# Patient Record
Sex: Male | Born: 1935
Health system: Southern US, Community
[De-identification: ages and names within clinical notes are randomized; demographics above are authoritative.]

## PROBLEM LIST (undated history)

## (undated) DIAGNOSIS — N400 Enlarged prostate without lower urinary tract symptoms: Secondary | ICD-10-CM

## (undated) DIAGNOSIS — L309 Dermatitis, unspecified: Secondary | ICD-10-CM

## (undated) DIAGNOSIS — E039 Hypothyroidism, unspecified: Secondary | ICD-10-CM

## (undated) DIAGNOSIS — I10 Essential (primary) hypertension: Secondary | ICD-10-CM

## (undated) DIAGNOSIS — M549 Dorsalgia, unspecified: Secondary | ICD-10-CM

## (undated) HISTORY — DX: Hypothyroidism, unspecified: E03.9

## (undated) HISTORY — DX: Essential (primary) hypertension: I10

## (undated) HISTORY — DX: Dorsalgia, unspecified: M54.9

## (undated) HISTORY — DX: Benign prostatic hyperplasia without lower urinary tract symptoms: N40.0

---

## 2008-08-30 ENCOUNTER — Inpatient Hospital Stay (HOSPITAL_COMMUNITY): Admission: EM | Admit: 2008-08-30 | Discharge: 2008-09-03 | Payer: Self-pay | Admitting: Emergency Medicine

## 2008-08-30 ENCOUNTER — Ambulatory Visit: Payer: Self-pay | Admitting: Internal Medicine

## 2008-08-31 ENCOUNTER — Encounter: Payer: Self-pay | Admitting: *Deleted

## 2008-08-31 LAB — CONVERTED CEMR LAB
Cholesterol: 199 mg/dL
Triglycerides: 88 mg/dL

## 2008-09-01 ENCOUNTER — Encounter: Payer: Self-pay | Admitting: Internal Medicine

## 2008-09-05 ENCOUNTER — Telehealth: Payer: Self-pay | Admitting: Internal Medicine

## 2008-09-13 ENCOUNTER — Encounter: Payer: Self-pay | Admitting: Internal Medicine

## 2008-09-18 ENCOUNTER — Ambulatory Visit: Payer: Self-pay | Admitting: Internal Medicine

## 2008-09-18 ENCOUNTER — Encounter: Payer: Self-pay | Admitting: Internal Medicine

## 2008-09-18 DIAGNOSIS — R55 Syncope and collapse: Secondary | ICD-10-CM | POA: Insufficient documentation

## 2008-09-18 DIAGNOSIS — I10 Essential (primary) hypertension: Secondary | ICD-10-CM | POA: Insufficient documentation

## 2008-09-18 DIAGNOSIS — N401 Enlarged prostate with lower urinary tract symptoms: Secondary | ICD-10-CM

## 2008-09-18 DIAGNOSIS — M549 Dorsalgia, unspecified: Secondary | ICD-10-CM | POA: Insufficient documentation

## 2008-09-18 DIAGNOSIS — D638 Anemia in other chronic diseases classified elsewhere: Secondary | ICD-10-CM | POA: Insufficient documentation

## 2008-09-18 DIAGNOSIS — D649 Anemia, unspecified: Secondary | ICD-10-CM

## 2008-09-18 LAB — CONVERTED CEMR LAB
Calcium: 9.3 mg/dL (ref 8.4–10.5)
Creatinine, Ser: 1.1 mg/dL (ref 0.40–1.50)
GFR calc Af Amer: >60 mL/min (ref >60)
Glucose, Bld: 87 mg/dL (ref 70–99)
MCHC: 32.1 g/dL (ref 30.0–36.0)
MCV: 84.1 fL (ref 78.0–100.0)
Platelets: 315 10*3/uL (ref 150–400)
RBC: 3.59 M/uL — ABNORMAL LOW (ref 4.22–5.81)
Sodium: 138 meq/L (ref 135–145)

## 2008-09-18 LAB — BASIC METABOLIC PANEL
Creatinine: 1.1 mg/dL (ref 0.6–1.3)
Sodium: 138 mmol/L (ref 137–147)

## 2008-09-18 LAB — CBC AND DIFFERENTIAL: WBC: 3.9 10^3/mL

## 2008-09-23 ENCOUNTER — Telehealth: Payer: Self-pay | Admitting: Internal Medicine

## 2008-09-24 ENCOUNTER — Encounter: Payer: Self-pay | Admitting: *Deleted

## 2008-09-28 ENCOUNTER — Encounter: Payer: Self-pay | Admitting: Internal Medicine

## 2008-10-01 ENCOUNTER — Encounter: Payer: Self-pay | Admitting: Internal Medicine

## 2008-10-01 ENCOUNTER — Encounter: Admission: RE | Admit: 2008-10-01 | Discharge: 2008-11-25 | Payer: Self-pay | Admitting: Internal Medicine

## 2008-10-07 ENCOUNTER — Encounter: Payer: Self-pay | Admitting: Internal Medicine

## 2008-10-07 ENCOUNTER — Telehealth: Payer: Self-pay | Admitting: Internal Medicine

## 2008-10-17 ENCOUNTER — Ambulatory Visit: Payer: Self-pay | Admitting: Internal Medicine

## 2008-10-18 ENCOUNTER — Emergency Department (HOSPITAL_COMMUNITY): Admission: EM | Admit: 2008-10-18 | Discharge: 2008-10-18 | Payer: Self-pay | Admitting: Emergency Medicine

## 2008-10-25 ENCOUNTER — Encounter: Payer: Self-pay | Admitting: Internal Medicine

## 2008-10-25 ENCOUNTER — Inpatient Hospital Stay (HOSPITAL_COMMUNITY): Admission: EM | Admit: 2008-10-25 | Discharge: 2008-10-27 | Payer: Self-pay | Admitting: Emergency Medicine

## 2008-10-25 ENCOUNTER — Ambulatory Visit: Payer: Self-pay | Admitting: Infectious Diseases

## 2008-10-25 DIAGNOSIS — R0602 Shortness of breath: Secondary | ICD-10-CM

## 2008-10-25 DIAGNOSIS — M549 Dorsalgia, unspecified: Secondary | ICD-10-CM

## 2008-10-25 HISTORY — DX: Dorsalgia, unspecified: M54.9

## 2008-10-27 ENCOUNTER — Encounter: Payer: Self-pay | Admitting: Internal Medicine

## 2008-10-28 ENCOUNTER — Ambulatory Visit: Payer: Self-pay | Admitting: Internal Medicine

## 2008-10-28 ENCOUNTER — Encounter: Payer: Self-pay | Admitting: Internal Medicine

## 2008-10-28 ENCOUNTER — Telehealth: Payer: Self-pay | Admitting: Internal Medicine

## 2008-10-28 LAB — TSH: TSH: 7.55 u[IU]/mL — AB (ref <=5.90)

## 2008-10-29 ENCOUNTER — Encounter: Payer: Self-pay | Admitting: Internal Medicine

## 2008-10-29 LAB — CONVERTED CEMR LAB: TSH: 7.554 microintl units/mL — ABNORMAL HIGH (ref 0.350–4.500)

## 2008-10-30 ENCOUNTER — Ambulatory Visit: Payer: Self-pay | Admitting: Internal Medicine

## 2008-10-30 DIAGNOSIS — E039 Hypothyroidism, unspecified: Secondary | ICD-10-CM | POA: Insufficient documentation

## 2008-10-31 ENCOUNTER — Encounter: Payer: Self-pay | Admitting: Infectious Disease

## 2008-11-03 ENCOUNTER — Ambulatory Visit: Payer: Self-pay | Admitting: *Deleted

## 2008-11-03 ENCOUNTER — Encounter: Payer: Self-pay | Admitting: Internal Medicine

## 2008-11-03 LAB — CONVERTED CEMR LAB
Basophils Absolute: 0 10*3/uL (ref 0.0–0.1)
Ferritin: 43 ng/mL (ref 22–322)
HCT: 33.1 % — ABNORMAL LOW (ref 39.0–52.0)
Lymphocytes Relative: 37 % (ref 12–46)
Lymphs Abs: 1.4 10*3/uL (ref 0.7–4.0)
Neutro Abs: 2 10*3/uL (ref 1.7–7.7)
Neutrophils Relative %: 51 % (ref 43–77)
Platelets: 312 10*3/uL (ref 150–400)
RDW: 13.8 % (ref 11.5–15.5)
Saturation Ratios: 6 % — ABNORMAL LOW (ref 20–55)
TIBC: 291 ug/dL (ref 215–435)
UIBC: 274 ug/dL
Vitamin B-12: 321 pg/mL (ref 211–911)
WBC: 3.9 10*3/uL — ABNORMAL LOW (ref 4.0–10.5)

## 2008-11-05 ENCOUNTER — Telehealth: Payer: Self-pay | Admitting: Internal Medicine

## 2008-11-06 ENCOUNTER — Ambulatory Visit (HOSPITAL_COMMUNITY): Admission: RE | Admit: 2008-11-06 | Discharge: 2008-11-06 | Payer: Self-pay | Admitting: Internal Medicine

## 2008-11-10 ENCOUNTER — Telehealth (INDEPENDENT_AMBULATORY_CARE_PROVIDER_SITE_OTHER): Payer: Self-pay | Admitting: *Deleted

## 2008-11-11 ENCOUNTER — Encounter: Payer: Self-pay | Admitting: Internal Medicine

## 2008-11-11 ENCOUNTER — Ambulatory Visit: Payer: Self-pay | Admitting: Infectious Disease

## 2008-11-11 LAB — CONVERTED CEMR LAB
INR: 1.1 (ref 0.0–1.5)
Prothrombin Time: 14.2 s (ref 11.6–15.2)

## 2008-11-13 ENCOUNTER — Telehealth: Payer: Self-pay | Admitting: *Deleted

## 2008-11-25 ENCOUNTER — Telehealth: Payer: Self-pay | Admitting: Internal Medicine

## 2008-11-25 ENCOUNTER — Encounter: Payer: Self-pay | Admitting: Internal Medicine

## 2008-11-27 ENCOUNTER — Ambulatory Visit: Payer: Self-pay | Admitting: Internal Medicine

## 2008-11-27 DIAGNOSIS — K5909 Other constipation: Secondary | ICD-10-CM

## 2008-11-27 DIAGNOSIS — F4322 Adjustment disorder with anxiety: Secondary | ICD-10-CM

## 2008-12-11 ENCOUNTER — Inpatient Hospital Stay (HOSPITAL_COMMUNITY): Admission: RE | Admit: 2008-12-11 | Discharge: 2008-12-15 | Payer: Self-pay | Admitting: Neurosurgery

## 2008-12-11 HISTORY — PX: LAMINECTOMY: SHX219

## 2008-12-23 ENCOUNTER — Encounter (INDEPENDENT_AMBULATORY_CARE_PROVIDER_SITE_OTHER): Payer: Self-pay | Admitting: Internal Medicine

## 2009-01-15 ENCOUNTER — Ambulatory Visit: Payer: Self-pay | Admitting: Infectious Diseases

## 2009-01-23 ENCOUNTER — Encounter: Payer: Self-pay | Admitting: Internal Medicine

## 2009-01-23 ENCOUNTER — Telehealth: Payer: Self-pay | Admitting: Internal Medicine

## 2010-07-14 IMAGING — CR DG ABDOMEN 2V
2 series · 2 of 2 positions shown · non-contrast
Comparison: Lumbar spine radiographs 08/30/2008 and chest
radiograph 10/18/2008

CLINICAL DATA: Shortness of breath.  Abdominal pain and
constipation

ABDOMEN - 2 VIEW

[w abdomen upright]
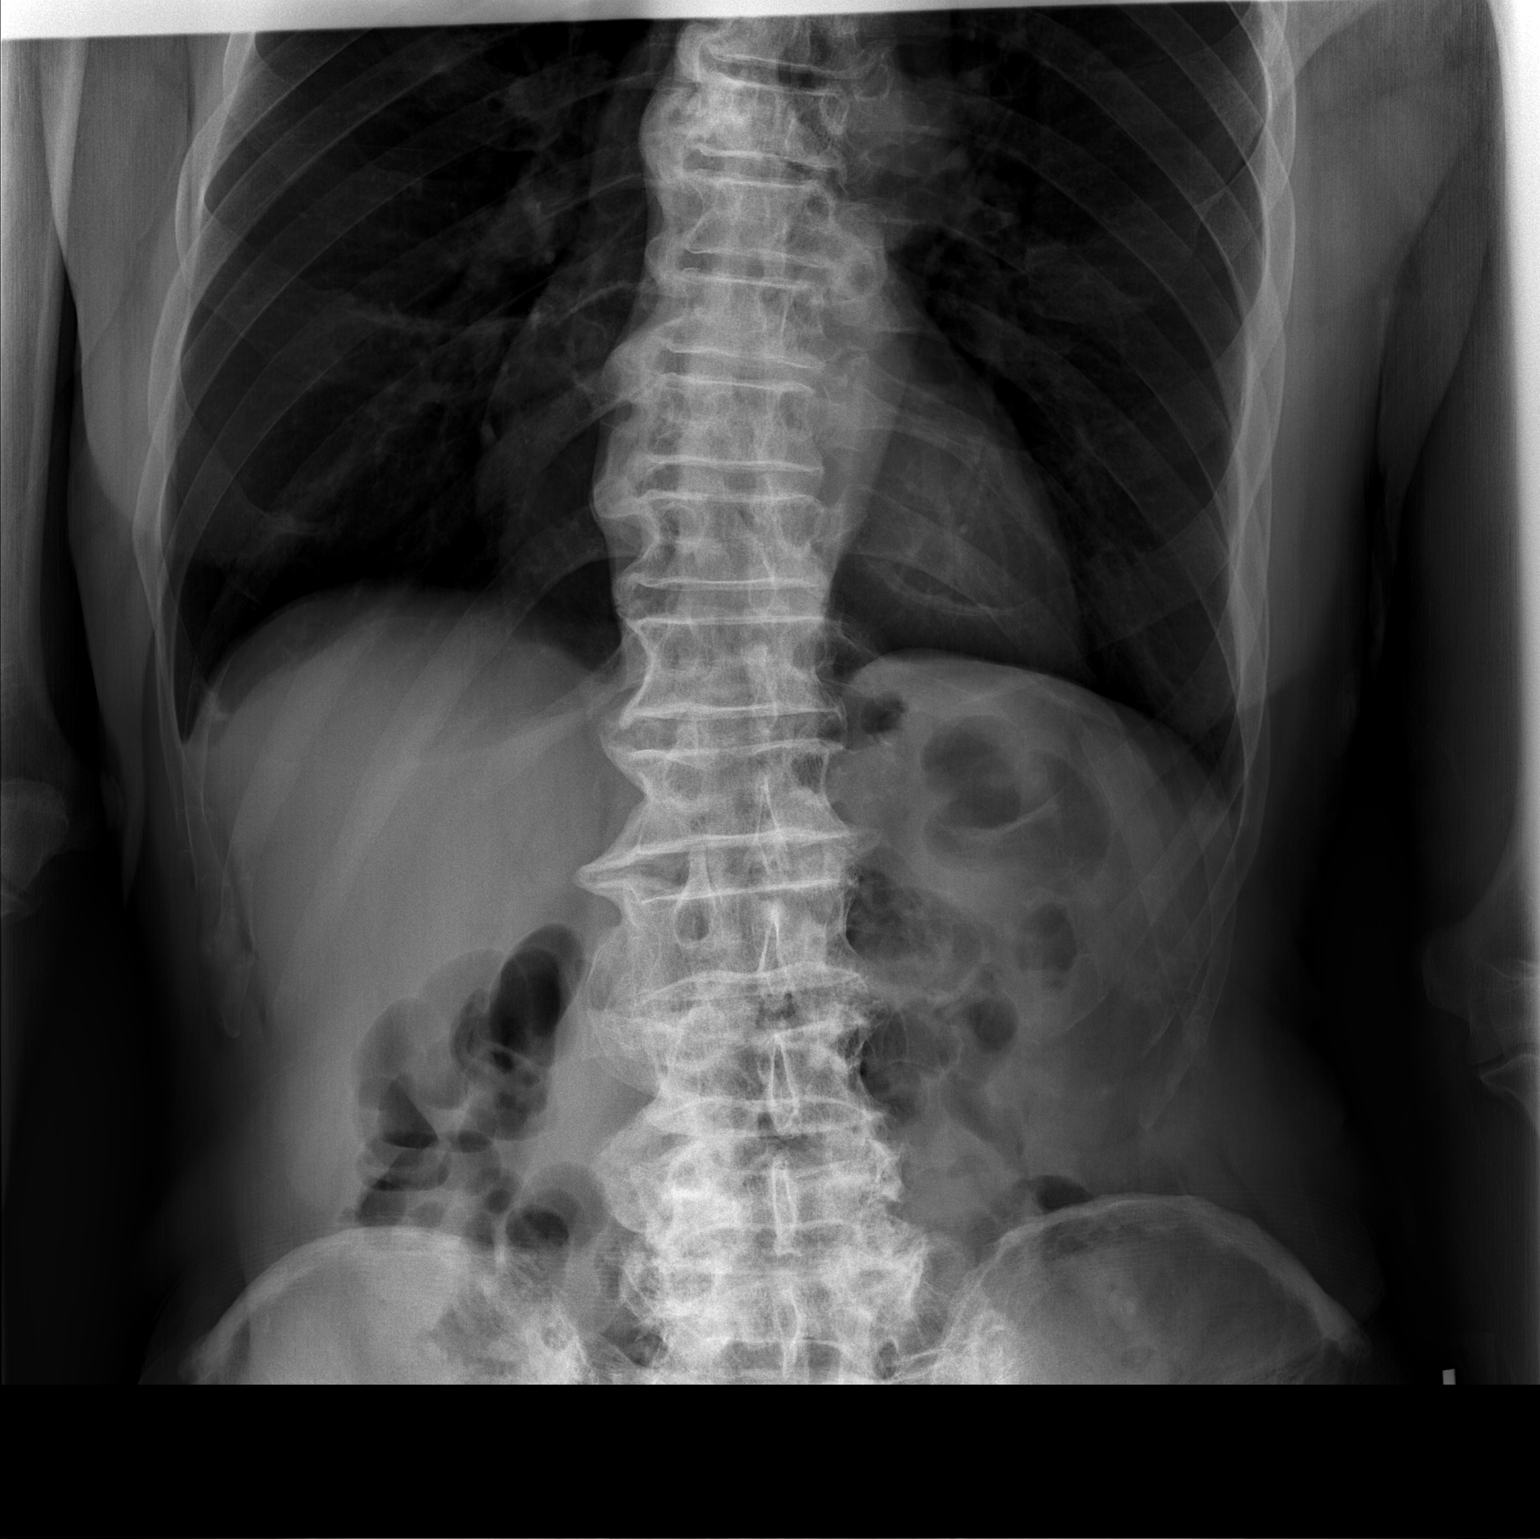

[t abdomen supine]
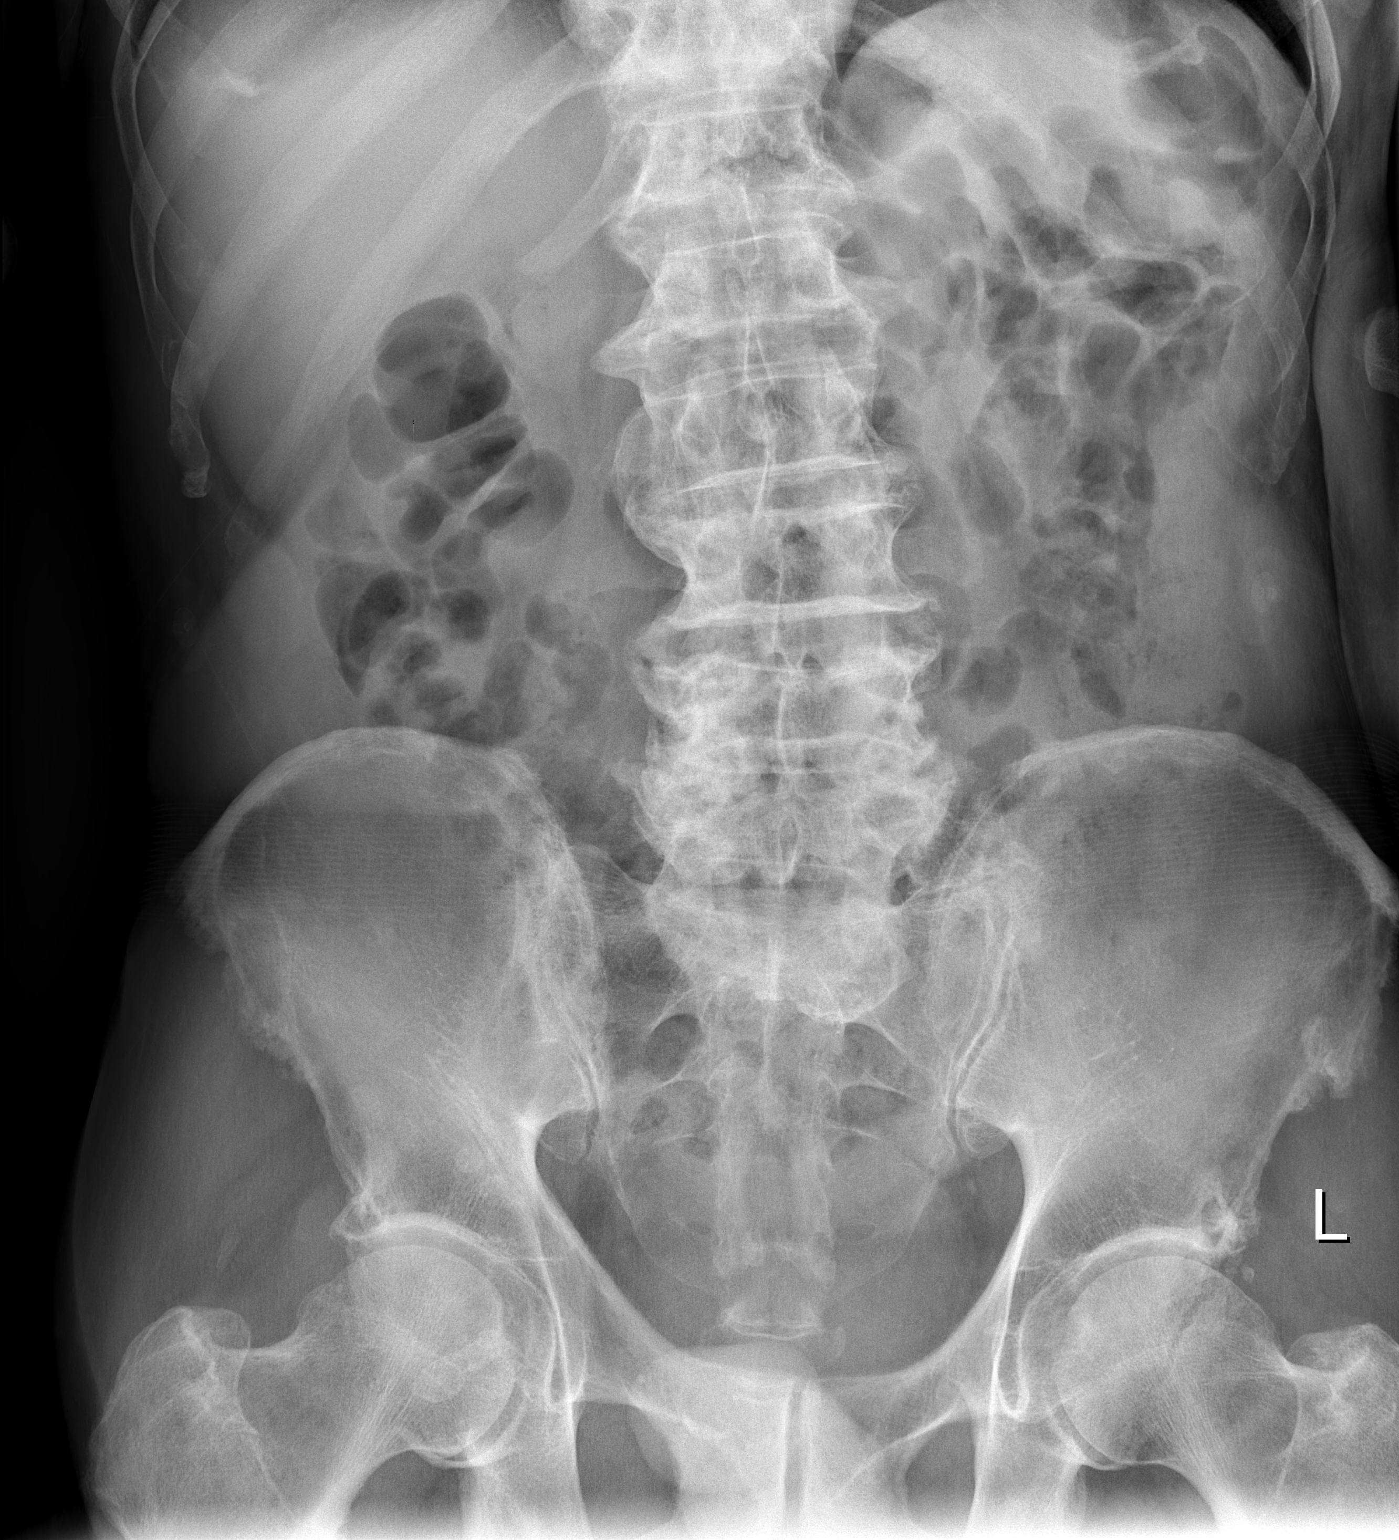

[2 of 2 positions shown; findings below may reference images not displayed]

FINDINGS: The lung bases are clear.

On the upright view, no free intraperitoneal air is identified.

The bowel gas pattern is nonobstructive.  No significant stool
burden is identified in the colon or rectum.  Severe degenerative
change of the lumbar spine as previously described, with extensive
osteophyte formation is again noted.
IMPRESSION: 1.  Nonobstructive bowel gas pattern.  No significant stool burden
identified.
2.  Extensive degenerative change of the lumbar spine.

## 2010-08-21 ENCOUNTER — Encounter: Payer: Self-pay | Admitting: Internal Medicine

## 2010-10-04 LAB — URINALYSIS, ROUTINE W REFLEX MICROSCOPIC
Bilirubin Urine: NEGATIVE
Glucose, UA: NEGATIVE mg/dL
Ketones, ur: NEGATIVE mg/dL
Nitrite: NEGATIVE
Protein, ur: NEGATIVE mg/dL
Specific Gravity, Urine: 1.018 (ref 1.005–1.030)
Urobilinogen, UA: 0.2 mg/dL (ref 0.0–1.0)
pH: 5.5 (ref 5.0–8.0)

## 2010-10-04 LAB — URINE CULTURE
Colony Count: NO GROWTH
Culture: NO GROWTH

## 2010-10-04 LAB — BASIC METABOLIC PANEL
BUN: 22 mg/dL (ref 6–23)
CO2: 28 mEq/L (ref 19–32)
Calcium: 8.7 mg/dL (ref 8.4–10.5)
Chloride: 104 mEq/L (ref 96–112)
Chloride: 104 mEq/L (ref 96–112)
Creatinine, Ser: 0.99 mg/dL (ref 0.4–1.5)
Creatinine, Ser: 1.21 mg/dL (ref 0.4–1.5)
GFR calc Af Amer: >60 mL/min (ref >60)
Glucose, Bld: 109 mg/dL — ABNORMAL HIGH (ref 70–99)
Sodium: 138 mEq/L (ref 135–145)

## 2010-10-04 LAB — CBC
Hemoglobin: 9.3 g/dL — ABNORMAL LOW (ref 13.0–17.0)
MCHC: 33.2 g/dL (ref 30.0–36.0)
MCHC: 32.9 g/dL (ref 30.0–36.0)
MCV: 85 fL (ref 78.0–100.0)
MCV: 83.3 fL (ref 78.0–100.0)
Platelets: 302 10*3/uL (ref 150–400)
RBC: 3.29 MIL/uL — ABNORMAL LOW (ref 4.22–5.81)
RDW: 14.3 % (ref 11.5–15.5)
WBC: 3.7 10*3/uL — ABNORMAL LOW (ref 4.0–10.5)

## 2010-10-04 LAB — TYPE AND SCREEN: ABO/RH(D): B POS

## 2010-10-04 LAB — ABO/RH: ABO/RH(D): B POS

## 2010-10-04 LAB — POCT I-STAT 4, (NA,K, GLUC, HGB,HCT)
Glucose, Bld: 109 mg/dL — ABNORMAL HIGH (ref 70–99)
Potassium: 3.6 mEq/L (ref 3.5–5.1)

## 2010-10-04 LAB — URINE MICROSCOPIC-ADD ON

## 2010-10-05 LAB — BASIC METABOLIC PANEL
CO2: 25 mEq/L (ref 19–32)
Calcium: 9.1 mg/dL (ref 8.4–10.5)
Chloride: 101 mEq/L (ref 96–112)
Glucose, Bld: 106 mg/dL — ABNORMAL HIGH (ref 70–99)
Sodium: 135 mEq/L (ref 135–145)

## 2010-10-05 LAB — DIFFERENTIAL
Basophils Absolute: 0 10*3/uL (ref 0.0–0.1)
Basophils Relative: 1 % (ref 0–1)
Eosinophils Absolute: 0.2 10*3/uL (ref 0.0–0.7)
Eosinophils Relative: 5 % (ref 0–5)

## 2010-10-05 LAB — CBC
HCT: 33.1 % — ABNORMAL LOW (ref 39.0–52.0)
Hemoglobin: 10.9 g/dL — ABNORMAL LOW (ref 13.0–17.0)
MCHC: 34.2 g/dL (ref 30.0–36.0)
MCV: 83.8 fL (ref 78.0–100.0)
MCV: 84.6 fL (ref 78.0–100.0)
Platelets: 295 10*3/uL (ref 150–400)
RDW: 14.3 % (ref 11.5–15.5)
RDW: 14.3 % (ref 11.5–15.5)

## 2010-10-05 LAB — POCT I-STAT, CHEM 8
Creatinine, Ser: 1.2 mg/dL (ref 0.4–1.5)
Glucose, Bld: 96 mg/dL (ref 70–99)
Hemoglobin: 11.6 g/dL — ABNORMAL LOW (ref 13.0–17.0)
TCO2: 26 mmol/L (ref 0–100)

## 2010-10-05 LAB — CK TOTAL AND CKMB (NOT AT ARMC)
CK, MB: 2.5 ng/mL (ref 0.3–4.0)
Total CK: 125 U/L (ref 7–232)

## 2010-10-05 LAB — CARDIAC PANEL(CRET KIN+CKTOT+MB+TROPI)
Total CK: 107 U/L (ref 7–232)
Troponin I: 0.03 ng/mL (ref 0.00–0.06)

## 2010-10-05 LAB — D-DIMER, QUANTITATIVE: D-Dimer, Quant: 0.35 ug/mL-FEU (ref 0.00–0.48)

## 2010-10-06 LAB — POCT CARDIAC MARKERS
CKMB, poc: 1.8 ng/mL (ref 1.0–8.0)
Myoglobin, poc: 95.6 ng/mL (ref 12–200)

## 2010-10-06 LAB — DIFFERENTIAL
Eosinophils Relative: 5 % (ref 0–5)
Lymphocytes Relative: 32 % (ref 12–46)
Lymphs Abs: 1.3 10*3/uL (ref 0.7–4.0)
Monocytes Absolute: 0.3 10*3/uL (ref 0.1–1.0)
Monocytes Relative: 7 % (ref 3–12)
Neutro Abs: 2.3 10*3/uL (ref 1.7–7.7)

## 2010-10-06 LAB — CBC
MCHC: 33.5 g/dL (ref 30.0–36.0)
MCV: 85.4 fL (ref 78.0–100.0)
Platelets: 296 10*3/uL (ref 150–400)
RDW: 14 % (ref 11.5–15.5)
WBC: 4.2 10*3/uL (ref 4.0–10.5)

## 2010-10-06 LAB — COMPREHENSIVE METABOLIC PANEL
AST: 23 U/L (ref 0–37)
Albumin: 3.8 g/dL (ref 3.5–5.2)
BUN: 9 mg/dL (ref 6–23)
Calcium: 9.4 mg/dL (ref 8.4–10.5)
Chloride: 105 mEq/L (ref 96–112)
Creatinine, Ser: 0.98 mg/dL (ref 0.4–1.5)
GFR calc Af Amer: >60 mL/min (ref >60)
Total Protein: 6.9 g/dL (ref 6.0–8.3)

## 2010-10-06 LAB — D-DIMER, QUANTITATIVE: D-Dimer, Quant: 0.26 ug/mL-FEU (ref 0.00–0.48)

## 2010-10-07 LAB — BASIC METABOLIC PANEL
BUN: 34 mg/dL — ABNORMAL HIGH (ref 6–23)
CO2: 21 mEq/L (ref 19–32)
CO2: 24 mEq/L (ref 19–32)
CO2: 25 mEq/L (ref 19–32)
Calcium: 9.2 mg/dL (ref 8.4–10.5)
Calcium: 9.3 mg/dL (ref 8.4–10.5)
Chloride: 106 mEq/L (ref 96–112)
Creatinine, Ser: 1.77 mg/dL — ABNORMAL HIGH (ref 0.4–1.5)
GFR calc Af Amer: 39 mL/min — ABNORMAL LOW (ref >60)
GFR calc Af Amer: >60 mL/min (ref >60)
GFR calc non Af Amer: 53 mL/min — ABNORMAL LOW (ref >60)
GFR calc non Af Amer: 57 mL/min — ABNORMAL LOW (ref >60)
Glucose, Bld: 102 mg/dL — ABNORMAL HIGH (ref 70–99)
Glucose, Bld: 102 mg/dL — ABNORMAL HIGH (ref 70–99)
Glucose, Bld: 88 mg/dL (ref 70–99)
Glucose, Bld: 92 mg/dL (ref 70–99)
Potassium: 4.3 mEq/L (ref 3.5–5.1)
Potassium: 5.5 mEq/L — ABNORMAL HIGH (ref 3.5–5.1)
Sodium: 132 mEq/L — ABNORMAL LOW (ref 135–145)
Sodium: 134 mEq/L — ABNORMAL LOW (ref 135–145)
Sodium: 136 mEq/L (ref 135–145)
Sodium: 137 mEq/L (ref 135–145)
Sodium: 139 mEq/L (ref 135–145)

## 2010-10-07 LAB — IRON AND TIBC
Saturation Ratios: 13 % — ABNORMAL LOW (ref 20–55)
TIBC: 303 ug/dL (ref 215–435)
UIBC: 263 ug/dL

## 2010-10-07 LAB — CARDIAC PANEL(CRET KIN+CKTOT+MB+TROPI)
CK, MB: 3.1 ng/mL (ref 0.3–4.0)
CK, MB: 3.3 ng/mL (ref 0.3–4.0)
CK, MB: 4.2 ng/mL — ABNORMAL HIGH (ref 0.3–4.0)
Relative Index: 3.1 — ABNORMAL HIGH (ref 0.0–2.5)
Relative Index: INVALID (ref 0.0–2.5)
Total CK: 99 U/L (ref 7–232)

## 2010-10-07 LAB — CBC
HCT: 28 % — ABNORMAL LOW (ref 39.0–52.0)
HCT: 30.6 % — ABNORMAL LOW (ref 39.0–52.0)
Hemoglobin: 10.2 g/dL — ABNORMAL LOW (ref 13.0–17.0)
Hemoglobin: 9.7 g/dL — ABNORMAL LOW (ref 13.0–17.0)
Hemoglobin: 9.8 g/dL — ABNORMAL LOW (ref 13.0–17.0)
Platelets: 341 10*3/uL (ref 150–400)
RBC: 3.31 MIL/uL — ABNORMAL LOW (ref 4.22–5.81)
RBC: 3.41 MIL/uL — ABNORMAL LOW (ref 4.22–5.81)
RDW: 14.9 % (ref 11.5–15.5)
RDW: 14.9 % (ref 11.5–15.5)
RDW: 15.2 % (ref 11.5–15.5)
WBC: 6.8 10*3/uL (ref 4.0–10.5)

## 2010-10-07 LAB — LIPID PANEL
Cholesterol: 199 mg/dL (ref 0–200)
HDL: 38 mg/dL — ABNORMAL LOW (ref >39)

## 2010-10-07 LAB — POCT I-STAT, CHEM 8
BUN: 40 mg/dL — ABNORMAL HIGH (ref 6–23)
Calcium, Ion: 1.13 mmol/L (ref 1.12–1.32)
Chloride: 110 mEq/L (ref 96–112)
Creatinine, Ser: 2.3 mg/dL — ABNORMAL HIGH (ref 0.4–1.5)
Sodium: 133 mEq/L — ABNORMAL LOW (ref 135–145)
TCO2: 19 mmol/L (ref 0–100)

## 2010-10-07 LAB — FOLATE: Folate: 7.6 ng/mL

## 2010-10-07 LAB — URINALYSIS, ROUTINE W REFLEX MICROSCOPIC
Nitrite: NEGATIVE
Protein, ur: NEGATIVE mg/dL
Specific Gravity, Urine: 1.013 (ref 1.005–1.030)
Urobilinogen, UA: 0.2 mg/dL (ref 0.0–1.0)

## 2010-10-07 LAB — HEPATIC FUNCTION PANEL
ALT: 22 U/L (ref 0–53)
Bilirubin, Direct: <0.1 mg/dL (ref 0.0–0.3)

## 2010-10-07 LAB — POCT CARDIAC MARKERS
CKMB, poc: 1.9 ng/mL (ref 1.0–8.0)
Myoglobin, poc: 91.5 ng/mL (ref 12–200)

## 2010-10-07 LAB — RETICULOCYTES
Retic Count, Absolute: 33.8 10*3/uL (ref 19.0–186.0)
Retic Ct Pct: 0.9 % (ref 0.4–3.1)

## 2010-10-07 LAB — SODIUM, URINE, RANDOM: Sodium, Ur: 58 mEq/L

## 2010-10-07 LAB — RAPID URINE DRUG SCREEN, HOSP PERFORMED
Amphetamines: NOT DETECTED
Benzodiazepines: NOT DETECTED

## 2010-10-07 LAB — DIFFERENTIAL
Basophils Absolute: 0.1 10*3/uL (ref 0.0–0.1)
Eosinophils Absolute: 0.3 10*3/uL (ref 0.0–0.7)
Eosinophils Relative: 5 % (ref 0–5)
Lymphocytes Relative: 29 % (ref 12–46)
Lymphs Abs: 2 10*3/uL (ref 0.7–4.0)
Neutrophils Relative %: 59 % (ref 43–77)

## 2010-10-07 LAB — CREATININE, URINE, RANDOM: Creatinine, Urine: 64.7 mg/dL

## 2010-10-07 LAB — ETHANOL: Alcohol, Ethyl (B): <5 mg/dL (ref 0–10)

## 2010-11-09 NOTE — Discharge Summary (Signed)
[name] [name], [name] [name] NO.:  [id number]   MEDICAL RECORD NO.:  [id number]          PATIENT TYPE:  INP   LOCATION:  3006                         FACILITY:  [facility]   PHYSICIAN:  Hewitt Shorts, M.D.DATE OF BIRTH:  1934/06/10   DATE OF ADMISSION:  12/11/2008  DATE OF DISCHARGE:  12/15/2008                               DISCHARGE SUMMARY   This is a patient that was admitted by Dr. Tressie Stalker on December 11, 2008.  Details of the admission are outlined in the history and physical  as dictated by Dr. Lovell Sheehan.  The patient again was admitted on December 11, 2008, with complaints of back pain and radicular pain.  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  He subsequently  underwent L2 to 4 laminectomy with L4-5 PLA by Dr. Lovell Sheehan and Dr.  Franky Macho assisted.  He has done well.  He was slow to start hospital  course.  The patient has worked with physical therapy, walking  independently now with his son.   His wound is well healed.  He is wearing his brace.  Discharge  instructions were given.  He has had problems with urination in the past  postoperatively.  He had a Foley catheter that was discontinued.  The  patient did not void.  We replaced the Foley to rest of the bladder over  the weekend, and he will go home today with a leg bag.  He has been  instructed by Dr. Newell Coral to follow up with [facility].  The unit  secretary on [address] is arranging for that.  The patient will follow up  within this week and followup with Dr. Lovell Sheehan in 2-3 weeks.  He is  closed with internal suture and external Steri-Strips.  They will come  off on their own, and he understands how to monitor this.  Again,  discharge instructions were given to the patient.  His discharge  condition is good.   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   DISCHARGE MEDICATIONS:  1. Resume all his home meds.  2. Allopurinol 100 mg  daily.  3. Doxazosin 4 mg daily.  4. Vicodin 5/500, I have given him a prescription for that 1-2 p.o.      q.4-6 hours p.r.n. pain, I gave him 50 with no refill.  5. He also takes multivitamin daily.  6. Thyroid 50 mcg daily.  7. Diclofenac 75 mg twice a day.  8. Alprazolam 0.5 two times a day.   He will see the patient back in our office with Dr. Lovell Sheehan in 2-3  weeks.      Russell L. Webb Silversmith, RN      Hewitt Shorts, M.D.  Electronically Signed    RLW/MEDQ  D:  12/15/2008  T:  12/15/2008  Job:  [id number]   cc:   Cristi Loron, M.D.

## 2010-11-09 NOTE — Discharge Summary (Signed)
NAMENOLYN, SWAB NO.:  [id number]   MEDICAL RECORD NO.:  [id number]          PATIENT TYPE:  INP   LOCATION:  4712                         FACILITY:  [facility]   PHYSICIAN:  Alvester Morin, M.D.  DATE OF BIRTH:  [birth year]   DATE OF ADMISSION:  10/25/2008  DATE OF DISCHARGE:  10/27/2008                               DISCHARGE SUMMARY   DISCHARGE DIAGNOSES:  1. Shortness of breath, etiology unknown.  2. Hypertension.  3. Benign prostatic hypertrophy.  4. Chronic back pain.  5. Gout.   DISCHARGE MEDICATIONS WITH ACCURATE DOSES:  1. Anacin 81 mg once a day.  2. Allopurinol 100 mg once a day.  3. Vicodin 5/500 mg 1-2 tablets every 4-6 hours as needed for pain.  4. Multivitamin tablets.  5. Alprazolam 0.5 mg tablet 1 tablet twice a day.  6. Doxazosin 4 mg 1 tablet once a day.   DISPOSITION AND FOLLOWUP:  The patient to follow up with Dr. Mliss Sax on Nov 04, 2008.  An earlier appointment was scheduled after the  patient disclosed that he plans to travel to [place] before the scheduled  appointment.  Another appointment on Oct 30, 2008, was made with Dr. Threasa Beards  at the Outpatient [facility].  The patient is also supposed to go to the  Outpatient Clinic and get his tuberculin skin test examined on Oct 28, 2008 at 9 in the morning.   PROCEDURES PERFORMED:  PPD test placed on Oct 26, 2008.  No induration  was found on Oct 27, 2008, before discharge.  The patient to follow up at  the Outpatient Clinic for final results on Oct 28, 2008.  Chest x-ray,  Oct 25, 2008; no acute chest findings observed.   CONSULTATIONS:  None.   BRIEF ADMITTING HISTORY AND PHYSICAL:  The patient is a 77 year old  [place] male who immigrated to the [place] in February 2010.  The  patient presented with spells of acute shortness of breath, each lasting  for about 10 minutes.  The spells were described as air hunger, relieved  by going out in the open air.  The patient reports feeling  cold all the  time and increasing temperature in the room to the point that his family  member cannot tolerate while he feels that the room temperature is  appropriate.  The patient had spells of shortness of breath throughout  the night the day prior to the admission.  They were not associated with  the chest pain, nausea, vomiting, diarrhea, constipation, fever, cough,  or productive sputum.  The patient does not report having any audible  wheezing.  The patient is not a known patient of COPD or asthma.  The  patient denies any allergy symptoms.  The last episode, the patient was  brought to the ED and his shortness of breath was relieved with oxygen  administration.   The patient also reports similar episode about a week prior to the  admission for which he came to emergency department and was evaluated  for it.   PHYSICAL EXAMINATION:  VITAL SIGNS:  On admission, temperature 98.2,  blood pressure 145/65, pulse of 86, respirations of 20, and O2  saturation 99% on room air.  GENERAL:  The patient had no acute distress.  HEENT:  EOMI.  PERRLA.  Oral mucosa pink and moist.  No nasal mucosal  bogginess.  No postnasal drip.  NECK:  No lymphopathy.  No carotid bruit.  No thyromegaly appreciated.  RESPIRATORY:  Clear to auscultation bilaterally.  Good air entry.  No  wheezing.  No rales.  No rhonchi.  CARDIOVASCULAR:  S1 and S2 normal.  Regular rate and rhythm.  No murmur.  No thrill.  No regurg.  ABDOMEN:  Soft and nontender.  No mass palpated.  No hepatosplenomegaly  appreciated.  Bowel sounds present.  CNS:  The patient was alert and oriented x3.  Cranial nerves II through  XII intact.  No motor or sensory deficits.  PSYCH:  Appropriate.   LABORATORY DATA:  On admission, hemoglobin of 11.3, white count 3.3, and  platelet count 295.  Sodium of 137, potassium of 4, chloride of 102, BUN  of 14, creatinine of 1.2, and bicarb of 26.  Point-of-care cardiac  marker negative.  D-dimer was  0.35.  TSH was 7.76.  BNP was less than 30  and free T4 of 0.9.   HOSPITAL COURSE BY PROBLEMS:  1. Shortness of breath.  The patient was admitted with spells of      shortness of breath.  The patient on admission had good O2      saturation and no respiratory distress.  The patient was continued      to be observed.  The patient was given albuterol inhalation therapy      empirically.  The patient during the admission had another episode      of acute shortness of breath over the nighttime that quickly      resolved with oxygen administration.  The patient's D-dimer was      negative.  The patient had pulmonary function tests which were      normal.  The patient had negative methacholine challenge test.  The      patient's symptoms were associated with feeling of anxiety and home      sickness.  The patient had recently moved from [place] where his      family, friend, and social support system exists.  The patient      reported his feeling of shortness of breath were often associated      with remembering his family and friends back home.  The patient was      empirically treated for anxiety with alprazolam 0.5 mg twice daily.      The patient on the next day reported very good sleep, feeling of      rejuvenation, and no new episodes of respiratory distress.  The      patient at present being discharged with primary suspicion of acute      shortness of breath episodes related with anxiety attacks.  The      patient will follow up in Outpatient Clinic with Dr. Aldine Contes.  He is      continued on Xanax therapy.  He is instructed about harmful effect      of overdose.  The patient is also instructed not to completely stop      this medication abruptly and symptoms of withdrawal were explained.      The patient would be further evaluated at the Outpatient Clinic  by      Dr. Aldine Contes and Dr. Threasa Beards.  2. Hypertension.  The patient was observed to have persistent      hypertension during the  hospitalization.  The patient's blood      pressure was elevated to 165 systolic and the patient was also      having symptoms of urinary retention and urgency secondary to      benign prostatic hypertrophy.  The patient's doxazosin dose was      increased from 2 mg to 4 mg once a day.  The patient reported      significant relief with urinary symptoms and blood pressure      normalized with systolic blood pressure of 118 and diastolic of 68      on second day of admission.  The patient will be continued to be      followed in the outpatient setting.  The patient is being      discharged with a new dose of doxazosin of 4 mg once a day.  3. Benign prostatic hypertrophy.  Please refer to problem #2.  The      patient's doxazosin dose was increased.  The patient's residual      volume on in and out catheter on the day of admission was 250 mL.      After increasing the doxazosin therapy, the patient had significant      relief for his BPH symptoms.  4. Chronic back pain.  The patient was continued on Vicodin therapy.      The patient neurologically did not have any focal deficits.  The      patient will be continued to be followed on an outpatient basis for      his chronic back pain.  5. Gout.  The patient was continued on allopurinol therapy.  No change      was made.  No acute gout tophi or joint swelling were noted on      admission.   DISCHARGE LABS AND VITALS:  Discharge vitals:  Temperature of 97.5,  blood pressure of 118/68, pulse of 82, respiratory rate of 20, and 100%  oxygen saturation on 2 liters of oxygen and 99% without oxygen at the  time of discharge.      Clerance Lav, MD PhD  Electronically Signed      Alvester Morin, M.D.  Electronically Signed    RS/MEDQ  D:  10/28/2008  T:  10/29/2008  Job:  [id number]   cc:   Outpatient Clinic

## 2010-11-09 NOTE — Discharge Summary (Signed)
[name] [name], [name] [name] NO.:  [id number]   MEDICAL RECORD NO.:  [id number]          PATIENT TYPE:  INP   LOCATION:  3708                         FACILITY:  [facility]   PHYSICIAN:  Alvester Morin, M.D.  DATE OF BIRTH:  1933-07-31   DATE OF ADMISSION:  08/30/2008  DATE OF DISCHARGE:  09/03/2008                               DISCHARGE SUMMARY   DISCHARGE DIAGNOSES:  1. Syncope - unclear etiology, negative work-up, no syncopal episodes      during the hospitalization.  2. Hyperkalemia - likely secondary to kidney injury, resolved on      discharge, potassium within normal limits.  3. Acute renal failure - unknown baseline Cr, likely secondary to      dehydration and questionable obstruction from benign prostatic      hypertrophy, resolved on discharge.  4. Urinary retention secondary to BPH.  Patient started on doxazosin      treatment, improved urinary output on discharge.  5. Back pain - secondary to severe degenerative disc disease, pain      better controlled on discharge.  6. Normocytic anemia - unknown baseline hemoglobin.  During the      hospitalization, hemoglobin stable at 10, likely anemia of chronic      disease.   DISCHARGE MEDICATIONS:  1. Aspirin 81 mg take once daily.  2. Allopurinol 100 mg take once daily for gout.  3. Doxazosin 1 mg take one tablet once daily at bedtime.  4. Vicodin 5/325 mg take 1-2 tablets every 4-6 hours as needed for      pain.  5. Multivitamin take 1 tablet daily.  6. MiraLax powder 17 grams put one tablespoon in one cup of water once      daily as needed for constipation.   DISPOSITION AND FOLLOW UP:  The patient was discharged from the unit in  stable condition.  No syncopal episodes during the hospitalization,  ambulatory and afebrile.  He will follow up in outpatient clinic with  Dr. Aldine Contes on September 18, 2008.  On follow-up appointment, please assess  BMET since the patient was admitted with hyperkalemia and had  increased  creatinine levels on admission.  In addition, may check CBC to assess if  hemoglobin stable compared to the hospitalization.  The patient was  discharged on new medication Doxazosin 1 mg tablet to take once daily at  bedtime, so please assess if that is working for him, if he is  tolerating medication well and if blood pressure is within normal  limits.  Also assess if the patient has had any syncopal episodes since  the discharge.  In addition, please check if Vicodin is adequate in back  pain control.  Allopurinol is the medication that patient has been on  for a few years for gout and he will continue to take that medication as  prescribed previously.   CONSULTATIONS:  None.   PROCEDURES:  08/30/2008 - CT of the head without contrast - no  intracranial hemorrhage, no acute infarction.  No hydrocephalous or mass  lesion.  08/30/2008 - CXR: borderline  heart size, otherwise negative.  Severe  degenerative changes throughout the thoracic spine.  08/30/2008 - Lumbar spine x-ray - severe degenerative disc and facet  disease, no fractures or acute findings.  09/01/2008 - 2-D echo:  Overall LV systolic function normal with EF 55%.  Aortic valve thickness moderately increased.  Overall normal study.   HISTORY OF PRESENT ILLNESS:  The patient 77 year old male with no  significant past medical history who came to [place] 10 days ago  from [place], [place], presents to ED with main concern of syncopal episode  one day prior to admission that happened at midnight while at shower.  This is the first time that it has happened to him and it was associated  with nausea, dizziness and back pain.  After the episode he felt okay  with no confusion, no weakness, tongue biting, no urinary or bowel  incontinence.  He denies fever, chills, shortness of breath, chest pain,  no palpitations, no abdominal concerns.  In addition he reports history  of urinary problems, straining, dribbling  frequency, nocturia, urinary  retention that had been going on for about one year now.  Back pain has  been lasting for about 3 years.   VITALS:  T 98.7, BP 157/69, P 83, R 18, saturating 100% on room air.   PHYSICAL EXAM:  General: Patient lying in bed, not in acute distress.  HEENT:  Eyes PERRLA, EOMI, 4 mm, sclera muddy, non conjunctival pallor.  Moist mucous membranes.  No oropharyngeal erythema.  Poor oral  dentition.  NECK: Supple with no lymphadenopathy.  No stiffness.  Full range of  motion.  LUNGS: Clear to auscultation bilaterally.  No wheezing, rhonchi or  rales.  CARDIOVASCULAR: Regular rate and rhythm, S1-S2 present.  No murmurs,  gallops or JVD.  No carotid bruits.  ABDOMEN:  Soft, nontender and nondistended.  Bowel sounds present.  No  guarding, no rebound tenderness.  EXTREMITY:  Pedal edema bilaterally.  No pitting edema.  Pulses  diminished in right lower extremity, compared to left extremity.  GU: No costovertebral angle tenderness.  SKIN:  No rashes.  Multiple scratch marks on lower and upper  extremities.  LYMPH SYSTEM:  No palpable lymphadenopathy.  NEUROLOGICALLY:  Alert, oriented x3.  Strength 5/5 throughout.  Sensation intact to soft touch.  Decreased sensation to position in  right foot, otherwise nonfocal neurologic exam.  PSYCHIATRIC:  Appropriate, not depressed-appearing.   LABS:  NA 132, K 6.0, CL 106, HCO3 21, BUN 38, Cr 2.03, Glu 102.  WBC 6.8, Hg 10.4, Plt 341, MCV 86.4, Ca 8.9.   HOSPITAL COURSE BY PROBLEM:  1. Syncope:  Likely orthostatic when patient fell.  Orthostatics at      admission were within normal limits.  Also possibly      neurocardiogenic in origin, associated with the event per HPI.      Cardiac etiology ruled out such as MI, sick sinus syndrome or      aortic stenosis.  Cardiac enzymes x3 negative.  Review of telemetry      did not show any abnormalities, no arrhythmias, blocks, no ST/T-      segment wave abnormalities and the  patient was in normal sinus      rhythm throughout the hospitalization.  2-D echo showed normal left      ventricular systolic fraction with normal ejection fraction.  TSH,      UDS within normal limits.  The patient was given fluids and has  responded well.  He was a able to ambulate without assistance and      had no syncopal episodes.  He will follow up for further management      in outpatient clinic.  He was discharged on aspirin.  CT of the      head negative for acute abnormalities, infarctions or hemorrhages.   1. Hyperkalemia, likely secondary to kidney injury.  The patient was      given Kayexalate, total of 45 mL, and potassium remained within      normal limits and was stable on discharge at 4.3.   1. Acute renal failure, unknown baseline in this patient, but likely      related to dehydration and possible obstruction due enlarged      prostate.  The patient was given fluids and doxazosin since      physical exam confirmed enlarged prostate.  The patient maintained      good urine output throughout the hospitalization.  Creatinine and      BUN eventually within normal limits and stable on discharge.  This      will be followed up in outpatient clinic.   1. Anemia, likely related to chronic disease.  Unknown baseline      hemoglobin.  For now will keep an eye on it and the patient will      follow up in outpatient clinic for further management.  During the      hospitalization, hemoglobin was stable and the patient was      hemodynamically stable.   1. Urinary retention secondary to BPH:  This is a new diagnosis for      the patient but the patient has had obstructive symptoms for a      while.  Doxazosin was started and the patient reported improved      voiding, less straining, less dribbling and less urinary retention.      Urinary retention was also confirmed by post-void residual scan      which showed 250 mL in the bladder after the Foley was removed.   1.  Lumbar back pain:  This is a chronic problem for the patient.  He      has severe degenerative disc disease.  He will be provided with      Vicodin on discharge and will follow up in outpatient clinic.   VITALS ON DISCHARGE:  T  98.3, BP 109/60, P 80, R 18, sat 99% on room  air.   LABS:  Na 137, K 4.3, Cl 105, HCO3 26, BUN 19, Cr 1.32 (GFR less than  50).  Glucose 88.  WBC 4.0, Hg 9.7, Plt 275.   Over 30 minutes were spent on discharging the patient.      Mliss Sax, MD  Electronically Signed      Alvester Morin, M.D.  Electronically Signed    IM/MEDQ  D:  09/03/2008  T:  09/03/2008  Job:  [id number]

## 2010-11-09 NOTE — Op Note (Signed)
[name], [name] NO.:  [id number]   MEDICAL RECORD NO.:  [id number]          PATIENT TYPE:  INP   LOCATION:  3006                         FACILITY:  [facility]   PHYSICIAN:  Cristi Loron, M.D.DATE OF BIRTH:  1933/12/01   DATE OF PROCEDURE:  12/11/2008  DATE OF DISCHARGE:                               OPERATIVE REPORT   BRIEF HISTORY:  The patient is a 77 year old black male who has suffered  from back and leg pain.  He has failed medical management, worked up  with a lumbar MRI which demonstrated the patient had multilevel  degenerative disk changes with a grade 1 spondylolisthesis at L4-5 with  severe stenosis with more moderate stenosis at L2-3 and L3-4.  I  discussed the various treatment options with the patient including  surgery.  The patient has weighed the risks, benefits, and alternatives  of the surgery and decided to proceed with a L2-3 and L3-4 decompression  with an L4-5 decompression, instrumentation, and fusion.   PREOPERATIVE DIAGNOSES:  L2-3, L3-4, and L4-5 spinal stenoses, L4-5  degenerative disk disease, spinal stasis, lumbago, and lumbar  radiculopathy.   POSTOPERATIVE DIAGNOSES:  L2-3, L3-4, and L4-5 spinal stenoses, L4-5  degenerative disk disease, spinal stasis, lumbago, and lumbar  radiculopathy.   PROCEDURE:  Bilateral L2, L3, and L4 laminotomies, foraminotomies to  decompress the bilateral L2, 3, 4, and 5 nerve roots using  microdissection(this work was in addition to the work that required to  do the posterior lumbar interbody fusion); L4-5 posterior lumbar  interbody fusion with local morselized autograft bone and Actifuse bone  graft extender; insertion of L4-5 interbody prosthesis (Capstone PEEK  interbody prosthesis); L4 and L5 posterior non-segmental instrumentation  with Legacy titanium pedicle screws and rods; L4-5 posterolateral  arthrodesis with local morselized autograft bone and Vitoss bone graft  extender.   SURGEON:  Cristi Loron, MD   ASSISTANT:  Coletta Memos, MD   ANESTHESIA:  General endotracheal.   ESTIMATED BLOOD LOSS:  300 mL.   SPECIMENS:  None.   DRAINS:  None.   COMPLICATIONS:  Durotomy.   PROCEDURE:  The patient was brought to the operating room by the  anesthesia team.  General endotracheal anesthesia was induced.  The  patient was turned to the prone position on the Wilson frame.  His  lumbosacral region was then prepared with Betadine scrub and Betadine  solution.  Sterile drapes were applied and injected the area to be  incised with Marcaine with epinephrine solution.  I used a scalpel to  make a linear midline incision of the L2-3, L3-4, and L4-5 interspaces.  I used electrocautery to perform a bilateral subperiosteal dissection  exposing the spinous process and lamina of L2, 3, 4, and 5.  We obtained  intraoperative radiograph to confirm our location.   We began the decompression by performing bilateral L2, L3, and L4  laminotomies with a high-speed drill.  We widened the laminotomies with  Kerrison punch removing the ligamentum flavum at L2-3, L3-4, and L4-5.  We used the Kerrison punches to remove the excess  ligamentum flavum and  perform foraminotomies about the bilateral L2, L3, L4, and L5 nerve  roots.  In the process of doing this, we did create a durotomy at L3-4  on the left.  We repaired this durotomy using 6-0 Prolene suture.  We  got a good closure.  Of note, because of this severe facet arthropathy  and stenosis at L4-5, more work was required to do the decompression  that would have required to do the posterior lumbar interbody fusion.   Having completed the decompression, we now turned our attention to the  arthrodesis.  We incised the L4-5 intervertebral disk with 15 blade  scalpel.  We performed a partial diskectomy using the pituitary forceps.  We prepared the vertebral endplates at L4-5 for the fusion by removing  soft tissues using the  curettes.  We then used trial spacers and  determined to use a 12 x 26 mm and a 12 x 22 mm PEEK interbody  prostheses.  We prefilled this prosthesis with a combination of local  morselized autograft bone and Actifuse bone graft extender.  We then  inserted the prosthesis into the L4-5 interspace bilaterally of course  after retracting the neural structures out of harm's way.  We got good  snug fit of prosthesis of the interspace.  We filled the remainder of  the disk space with Actifuse and local autograft bone.  This completed  the posterior lumbar interbody fusion and insertion of the prosthesis.   We now turned our attention to the instrumentation under fluoroscopic  guidance.  We decannulated the bilateral L4 and L5 pedicles with bone  probe.  We tapped the pedicles with the 6.5-mm tap.  We then probed  inside the tapped pedicles to rule out cortical breeches.  We then  inserted 7.5 x 55 mm pedicle screws bilaterally at L4 and 7.5 x 50  bilaterally at L5 under fluoroscopic guidance.  We then palpated along  the medial aspect of bilateral L4 and L5 pedicles to rule out cortical  breeches.  There were none apparent.  We then connected unilateral  pedicle screws with a lordotic rod.  We compressed the construct and  secured the rod in place with caps, which we tightened appropriately.  This completed the instrumentation.   We now turned our attention to the posterolateral arthrodesis.  We used  a high-speed drill to decorticate the remainder of the L4-5 facets,  pars, and transverse process.  We then laid a combination of local  morselized autograft bone as well as Vitoss bone graft extender over  these to decorticate the posterolateral structures.  This completed the  posterolateral arthrodesis.  We then inspected the thecal sac at L2-3,  L3-4, and L4-5 as well as the bilateral L2-3, and L4-5 nerve roots and  noted they were well decompressed.  We obtained hemostasis using bipolar   cautery.  We irrigated the wound out with bacitracin solution.  We then  placed DuraSeal over the repaired durotomy.  We then removed the  retractors and then reapproximated the patient's thoracolumbar fascia  with interrupted #1 Vicryl suture, the subcutaneous tissue with  interrupted 2-0 Vicryl suture, and the skin with Steri-Strips and  benzoin.  The wound was then coated with bacitracin ointment and sterile  dressing was applied.  The drapes were removed, and the patient was  subsequently returned to the supine position and extubated by the  anesthesia team and transported to the Postanesthesia Care Unit in  stable condition.  All  sponge, instrument, and needle counts were  correct at the end of this case.   I should also mention that we repaired the durotomy under the  magnification and illumination of the microscope using microdissection.     Cristi Loron, M.D.  Electronically Signed    JDJ/MEDQ  D:  12/11/2008  T:  12/12/2008  Job:  [id number]

## 2017-01-02 ENCOUNTER — Emergency Department (HOSPITAL_BASED_OUTPATIENT_CLINIC_OR_DEPARTMENT_OTHER)
Admission: EM | Admit: 2017-01-02 | Discharge: 2017-01-02 | Disposition: A | Discharge status: Home / Self Care | Payer: Self-pay | Attending: Emergency Medicine | Admitting: Emergency Medicine

## 2017-01-02 ENCOUNTER — Emergency Department (HOSPITAL_BASED_OUTPATIENT_CLINIC_OR_DEPARTMENT_OTHER): Payer: Self-pay

## 2017-01-02 ENCOUNTER — Encounter (HOSPITAL_BASED_OUTPATIENT_CLINIC_OR_DEPARTMENT_OTHER): Payer: Self-pay | Admitting: Emergency Medicine

## 2017-01-02 DIAGNOSIS — Z7982 Long term (current) use of aspirin: Secondary | ICD-10-CM | POA: Insufficient documentation

## 2017-01-02 DIAGNOSIS — I10 Essential (primary) hypertension: Secondary | ICD-10-CM | POA: Insufficient documentation

## 2017-01-02 DIAGNOSIS — R55 Syncope and collapse: Secondary | ICD-10-CM | POA: Insufficient documentation

## 2017-01-02 DIAGNOSIS — Z79899 Other long term (current) drug therapy: Secondary | ICD-10-CM | POA: Insufficient documentation

## 2017-01-02 DIAGNOSIS — R072 Precordial pain: Secondary | ICD-10-CM | POA: Insufficient documentation

## 2017-01-02 DIAGNOSIS — E039 Hypothyroidism, unspecified: Secondary | ICD-10-CM | POA: Insufficient documentation

## 2017-01-02 HISTORY — DX: Dermatitis, unspecified: L30.9

## 2017-01-02 LAB — CBC
HEMATOCRIT: 33.5 % — AB (ref 39.0–52.0)
HEMOGLOBIN: 11.5 g/dL — AB (ref 13.0–17.0)
MCH: 31 pg (ref 26.0–34.0)
MCHC: 34.3 g/dL (ref 30.0–36.0)
MCV: 90.3 fL (ref 78.0–100.0)
Platelets: 219 10*3/uL (ref 150–400)
RBC: 3.71 MIL/uL — AB (ref 4.22–5.81)
RDW: 17.3 % — ABNORMAL HIGH (ref 11.5–15.5)
WBC: 3.6 10*3/uL — ABNORMAL LOW (ref 4.0–10.5)

## 2017-01-02 LAB — BASIC METABOLIC PANEL
ANION GAP: 8 (ref 5–15)
BUN: 36 mg/dL — ABNORMAL HIGH (ref 6–20)
CHLORIDE: 102 mmol/L (ref 101–111)
CO2: 21 mmol/L — AB (ref 22–32)
Calcium: 9 mg/dL (ref 8.9–10.3)
Creatinine, Ser: 1.38 mg/dL — ABNORMAL HIGH (ref 0.61–1.24)
GFR calc non Af Amer: 46 mL/min — ABNORMAL LOW (ref >60)
GFR, EST AFRICAN AMERICAN: 54 mL/min — AB (ref >60)
Glucose, Bld: 92 mg/dL (ref 65–99)
POTASSIUM: 5.1 mmol/L (ref 3.5–5.1)
Sodium: 131 mmol/L — ABNORMAL LOW (ref 135–145)

## 2017-01-02 LAB — URINALYSIS, ROUTINE W REFLEX MICROSCOPIC
Bilirubin Urine: NEGATIVE
Glucose, UA: NEGATIVE mg/dL
Hgb urine dipstick: NEGATIVE
KETONES UR: NEGATIVE mg/dL
LEUKOCYTES UA: NEGATIVE
NITRITE: NEGATIVE
PH: 6 (ref 5.0–8.0)
Protein, ur: NEGATIVE mg/dL
Specific Gravity, Urine: 1.011 (ref 1.005–1.030)

## 2017-01-02 LAB — TROPONIN I

## 2017-01-02 LAB — D-DIMER, QUANTITATIVE: D-Dimer, Quant: 0.33 ug/mL-FEU (ref 0.00–0.50)

## 2017-01-02 MED ORDER — LISINOPRIL 5 MG PO TABS: 5.0000 mg | ORAL_TABLET | Freq: Every day | ORAL | 0 refills | Status: DC

## 2017-01-02 MED ORDER — SODIUM CHLORIDE 0.9 % IV BOLUS (SEPSIS)
500.0000 mL | Freq: Once | INTRAVENOUS | Status: AC
  Administered 2017-01-02: 500 mL via INTRAVENOUS

## 2017-01-02 MED ORDER — ALLOPURINOL 100 MG PO TABS
100.0000 mg | ORAL_TABLET | Freq: Every day | ORAL | 0 refills | Status: DC
Start: 2017-01-02 — End: 2017-01-16

## 2017-01-02 MED ORDER — HYDROCHLOROTHIAZIDE 25 MG PO TABS
25.0000 mg | ORAL_TABLET | Freq: Every day | ORAL | 0 refills | Status: DC
Start: 2017-01-02 — End: 2017-01-16

## 2017-01-02 MED ORDER — DOXAZOSIN MESYLATE 4 MG PO TABS: 4.0000 mg | ORAL_TABLET | Freq: Every day | ORAL | 0 refills | Status: DC

## 2017-01-02 NOTE — ED Provider Notes (Signed)
[facility] Provider Note   CSN: [id number] Arrival date & time: 01/02/17  1913  By signing my name below, I, Cynda Acres, attest that this documentation has been prepared under the direction and in the presence of Rolan Bucco, MD. Electronically Signed: Cynda Acres, Scribe. 01/02/17. 8:20 PM.  History   Chief Complaint Chief Complaint  Patient presents with  . Chest Pain   HPI Comments: [name] [name] is a 83 y.o. male with a history of hypertension, who presents to the Emergency Department complaining of a syncopal episode. He was in the shower earlier today and his son heard a thud and noted him to be unconscious. He was unconscious for 10-15 seconds and then confused for a short period time after. Today the son noted that he was complaining of some chest pain after walking in the park. He currently denies any chest pain. He denies any shortness of breath. He denies any pleuritic-type pain. He has had some mild swelling to his lower extremities bilaterally but no Pain. The patient has loss consciousness in the past when he was diagnosed with a MI. Patient was admitted three weeks ago in [place] for an MI according to son. He was also anemic at that time and given 3 units of blood.  No medications taken prior to arrival. Patient denies any fever, chills, dizziness, lightheadedness, nausea, vomiting, abdominal pain, cough, or any additional symptoms. Patient denies any injuries from the fall. He denies any neck or back pain. No extremity pain. He denies a headache. He denies any symptoms that preceded the syncope. No dizziness chest pain or palpitations immediately preceding the syncopal event.   Son is also requesting a refill of the patient's medications until he can f/u with a PCP.  He has an appt with a new PCP via his orange card on July 30.  The history is provided by the patient. No language interpreter was used.    Past Medical History:  Diagnosis Date  . Back pain  10/2008   per MRI 11/06/2008 - severe spinal stenosis worse at L4-5, L3-4, with neural compression at those levels  . BPH (benign prostatic hyperplasia)   . Eczema   . Gout   . Hypertension   . Hypothyroidism     Patient Active Problem List   Diagnosis Date Noted  . HYPOTHYROIDISM 10/30/2008  . ANEMIA, NORMOCYTIC 09/18/2008  . HYPERTENSION 09/18/2008  . BENIGN PROSTATIC HYPERTROPHY, WITH URINARY OBSTRUCTION 09/18/2008  . BACK PAIN, CHRONIC 09/18/2008    Past Surgical History:  Procedure Laterality Date  . LAMINECTOMY  12/11/2008    Bilateral L2, L3, and L4 laminotomies, done by Dr. Lovell Sheehan       Home Medications    Prior to Admission medications   Medication Sig Start Date End Date Taking? Authorizing Provider  allopurinol (ZYLOPRIM) 100 MG tablet Take 1 tablet (100 mg total) by mouth daily. 01/02/17   Rolan Bucco, MD  ALPRAZolam Prudy Feeler) 0.5 MG tablet Take 0.5 mg by mouth 2 (two) times daily as needed.      [provider]  aspirin 81 MG tablet Take 81 mg by mouth daily.      [provider]  doxazosin (CARDURA) 4 MG tablet Take 1 tablet (4 mg total) by mouth at bedtime. 01/02/17   Rolan Bucco, MD  hydrochlorothiazide (HYDRODIURIL) 25 MG tablet Take 1 tablet (25 mg total) by mouth daily. 01/02/17   Rolan Bucco, MD  HYDROcodone-acetaminophen (VICODIN) 5-500 MG per tablet Take 1 tablet by mouth  every 6 (six) hours as needed.      [provider]  levothyroxine (SYNTHROID, LEVOTHROID) 50 MCG tablet Take 50 mcg by mouth daily.      [provider]  lisinopril (PRINIVIL,ZESTRIL) 5 MG tablet Take 1 tablet (5 mg total) by mouth daily. 01/02/17   Rolan Bucco, MD  multivitamin [facility]) per tablet Take 1 tablet by mouth daily.      [provider]    Family History Family History  Problem Relation Age of Onset  . Stroke Neg Hx   . Cancer Neg Hx     Social History Social History  Substance Use Topics  . Smoking status: Never  Smoker  . Smokeless tobacco: Never Used  . Alcohol use No     Allergies   Patient has no known allergies.   Review of Systems Review of Systems  Constitutional: Negative for chills, diaphoresis, fatigue and fever.  HENT: Negative for congestion, rhinorrhea and sneezing.   Eyes: Negative.   Respiratory: Positive for shortness of breath. Negative for cough and chest tightness.   Cardiovascular: Positive for chest pain. Negative for leg swelling.  Gastrointestinal: Negative for abdominal pain, blood in stool, diarrhea, nausea and vomiting.  Genitourinary: Negative for difficulty urinating, flank pain, frequency and hematuria.  Musculoskeletal: Negative for arthralgias and back pain.  Skin: Negative for rash.  Neurological: Negative for dizziness, speech difficulty, weakness, numbness and headaches.  All other systems reviewed and are negative.    Physical Exam Updated Vital Signs BP (!) 144/75 (BP Location: Right Arm)   Pulse 70   Temp 98.7 F (37.1 C) (Oral)   Resp 16   Ht 6' (1.829 m)   Wt 75.7 kg (166 lb 14.2 oz)   SpO2 100%   BMI 22.63 kg/m   Physical Exam  Constitutional: He is oriented to person, place, and time. He appears well-developed and well-nourished.  HENT:  Head: Normocephalic and atraumatic.  Mouth/Throat: Oropharynx is clear and moist.  Eyes: Conjunctivae and EOM are normal. Pupils are equal, round, and reactive to light.  Neck: Normal range of motion. Neck supple.  No pain to the cervical thoracic or lumbosacral spine  Cardiovascular: Normal rate, regular rhythm and normal heart sounds.   Pulmonary/Chest: Effort normal and breath sounds normal. No respiratory distress. He has no wheezes. He has no rales. He exhibits no tenderness.  Abdominal: Soft. Bowel sounds are normal. There is no tenderness. There is no rebound and no guarding.  Musculoskeletal: Normal range of motion. He exhibits edema.  1+ pitting edema in the bilateral lower extremities. No  pain on palpation or range of motion extremities  Lymphadenopathy:    He has no cervical adenopathy.  Neurological: He is alert and oriented to person, place, and time.  Motor 5/5 all extremities Sensation grossly intact to LT all extremities Finger to Nose intact, no pronator drift CN II-XII grossly intact    Skin: Skin is warm and dry. No rash noted.  Psychiatric: He has a normal mood and affect.  Nursing note and vitals reviewed.    ED Treatments / Results  DIAGNOSTIC STUDIES: Oxygen Saturation is 100% on RA, normal by my interpretation.    COORDINATION OF CARE: 8:20 PM Discussed treatment plan with pt at bedside and pt agreed to plan, which includes imaging and lab work.   Labs (all labs ordered are listed, but only abnormal results are displayed) Labs Reviewed  BASIC METABOLIC PANEL - Abnormal; Notable for the following:  Result Value   Sodium 131 (*)    CO2 21 (*)    BUN 36 (*)    Creatinine, Ser 1.38 (*)    GFR calc non Af Amer 46 (*)    GFR calc Af Amer 54 (*)    All other components within normal limits  CBC - Abnormal; Notable for the following:    WBC 3.6 (*)    RBC 3.71 (*)    Hemoglobin 11.5 (*)    HCT 33.5 (*)    RDW 17.3 (*)    All other components within normal limits  TROPONIN I  D-DIMER, QUANTITATIVE (NOT AT [facility])  URINALYSIS, ROUTINE W REFLEX MICROSCOPIC    EKG  EKG Interpretation  Date/Time:  Monday January 02 2017 19:21:15 EDT Ventricular Rate:  82 PR Interval:  166 QRS Duration: 88 QT Interval:  370 QTC Calculation: 432 R Axis:   75 Text Interpretation:  Normal sinus rhythm Normal ECG since last tracing no significant change Confirmed by Rolan Bucco [phone]) on 01/02/2017 7:32:55 PM       Radiology Dg Chest 2 View  Result Date: 01/02/2017 CLINICAL DATA:  83 y/o M; left-sided chest pain with shortness of breath. EXAM: CHEST  2 VIEW COMPARISON:  10/25/2008 chest radiograph FINDINGS: Stable heart size and mediastinal contours are  within normal limits. Both lungs are clear. Blunting of left costal diaphragmatic angle may represent a small effusion. Flowing anterior ossification of upper thoracic spine compatible with DISH. IMPRESSION: Blunting of left costal diaphragmatic angle may represent a small effusion. No focal consolidation. Electronically Signed   By: Mitzi Hansen M.D.   On: 01/02/2017 20:13   Ct Head Wo Contrast  Result Date: 01/02/2017 CLINICAL DATA:  Left-sided chest pain for 1 hour fell in shower unwitnessed fall EXAM: CT HEAD WITHOUT CONTRAST TECHNIQUE: Contiguous axial images were obtained from the base of the skull through the vertex without intravenous contrast. COMPARISON:  08/30/2008 FINDINGS: Brain: No acute territorial infarction, hemorrhage, or intracranial mass is seen. Moderate atrophy. Mild small vessel ischemic changes of the white matter. Nonenlarged ventricles. Vascular: No hyperdense vessels.  Carotid artery calcifications. Skull: No fracture. Partially visualized sclerotic bony growth at the left anterior maxillary sinus wall. Sinuses/Orbits: Mild mucosal thickening in the maxillary and ethmoid sinuses. No acute orbital abnormality Other: None IMPRESSION: No CT evidence for acute intracranial abnormality. Atrophy and small vessel ischemic changes of the white matter Electronically Signed   By: Jasmine Pang M.D.   On: 01/02/2017 23:11    Procedures Procedures (including critical care time)  Medications Ordered in ED Medications  sodium chloride 0.9 % bolus 500 mL (0 mLs Intravenous Stopped 01/02/17 2244)     Initial Impression / Assessment and Plan / ED Course  I have reviewed the triage vital signs and the nursing notes.  Pertinent labs & imaging results that were available during my care of the patient were reviewed by me and considered in my medical decision making (see chart for details).     Patient presents after syncopal episode. He also was noted to have some exertional  chest pain earlier in the day. His EKG does not show evidence of ischemia. His troponin is negative. He's currently asymptomatic. However given his symptoms I felt that he needed to be admitted for further cardiac evaluation. He doesn't appear to have any injuries from his fall. Patient is adamantly refusing admission. I spoke to him at length regarding my concerns and the fact that I felt that he needed  to be admitted.  He is adamantly refusing admission. His son states that he will bring him back to the emergency department if he has any worsening symptoms. He has an appointment with the PCP on July 30. I did give him a refill on his blood pressure medicines as well as allopurinol and Cardura. He was given strict return precautions.  Final Clinical Impressions(s) / ED Diagnoses   Final diagnoses:  Precordial pain  Syncope, unspecified syncope type    New Prescriptions New Prescriptions   HYDROCHLOROTHIAZIDE (HYDRODIURIL) 25 MG TABLET    Take 1 tablet (25 mg total) by mouth daily.   LISINOPRIL (PRINIVIL,ZESTRIL) 5 MG TABLET    Take 1 tablet (5 mg total) by mouth daily.   I personally performed the services described in this documentation, which was scribed in my presence.  The recorded information has been reviewed and considered.    Rolan Bucco, MD 01/02/17 2328

## 2017-01-02 NOTE — ED Triage Notes (Addendum)
Left sided chest pain x1 hour. Pt fell in shower. Unwitnessed fall.  Son states pt "passed out for about 15 seconds". Pt denies chest pain at this time. Speaks Arabic. Son translating.

## 2017-01-16 ENCOUNTER — Ambulatory Visit: Payer: Self-pay | Attending: Internal Medicine | Admitting: Physician Assistant

## 2017-01-16 VITALS — BP 113/65 | HR 80 | Temp 97.5°F | Wt 170.2 lb

## 2017-01-16 DIAGNOSIS — Z7982 Long term (current) use of aspirin: Secondary | ICD-10-CM | POA: Insufficient documentation

## 2017-01-16 DIAGNOSIS — Z79899 Other long term (current) drug therapy: Secondary | ICD-10-CM | POA: Insufficient documentation

## 2017-01-16 DIAGNOSIS — D649 Anemia, unspecified: Secondary | ICD-10-CM | POA: Insufficient documentation

## 2017-01-16 DIAGNOSIS — N179 Acute kidney failure, unspecified: Secondary | ICD-10-CM

## 2017-01-16 DIAGNOSIS — I219 Acute myocardial infarction, unspecified: Secondary | ICD-10-CM | POA: Insufficient documentation

## 2017-01-16 DIAGNOSIS — I10 Essential (primary) hypertension: Secondary | ICD-10-CM

## 2017-01-16 DIAGNOSIS — N400 Enlarged prostate without lower urinary tract symptoms: Secondary | ICD-10-CM | POA: Insufficient documentation

## 2017-01-16 DIAGNOSIS — R55 Syncope and collapse: Secondary | ICD-10-CM

## 2017-01-16 DIAGNOSIS — E039 Hypothyroidism, unspecified: Secondary | ICD-10-CM | POA: Insufficient documentation

## 2017-01-16 MED ORDER — ASPIRIN 81 MG PO TABS: 81.0000 mg | ORAL_TABLET | Freq: Every day | ORAL | 3 refills | Status: DC

## 2017-01-16 MED ORDER — DOXAZOSIN MESYLATE 4 MG PO TABS: 4.0000 mg | ORAL_TABLET | Freq: Every day | ORAL | 3 refills | Status: DC

## 2017-01-16 MED ORDER — HYDROCHLOROTHIAZIDE 25 MG PO TABS: 25.0000 mg | ORAL_TABLET | Freq: Every day | ORAL | 3 refills | Status: DC

## 2017-01-16 MED ORDER — LISINOPRIL 5 MG PO TABS: 5.0000 mg | ORAL_TABLET | Freq: Every day | ORAL | 3 refills | Status: DC

## 2017-01-16 MED ORDER — ALLOPURINOL 100 MG PO TABS: 100.0000 mg | ORAL_TABLET | Freq: Every day | ORAL | 3 refills | Status: DC

## 2017-01-16 MED ORDER — LEVOTHYROXINE SODIUM 50 MCG PO TABS: 50.0000 ug | ORAL_TABLET | Freq: Every day | ORAL | 3 refills | Status: DC

## 2017-01-16 MED FILL — ?LISINOPRIL 5 MG TABLET: 5 | 30 days supply | Qty: 30 | Fill #0

## 2017-01-16 MED FILL — LEVOTHYROXINE 50 MCG TABLET: 50 | 30 days supply | Qty: 30 | Fill #0

## 2017-01-16 MED FILL — ?ALLOPURINOL 100MG TABLET: 100 | 30 days supply | Qty: 30 | Fill #0

## 2017-01-16 MED FILL — ?DOXAZOSIN MESYLATE 4MG TAB: 4 | 30 days supply | Qty: 30 | Fill #0

## 2017-01-16 MED FILL — HYDROCHLOROTHIAZIDE 25 MG T: 25 | 30 days supply | Qty: 30 | Fill #0

## 2017-01-16 NOTE — Progress Notes (Signed)
[name] [name]  ZOX:[id number]  WJX:[id number]  DOB - 23-Jul-1933  Chief Complaint  Patient presents with  . Hospitalization Follow-up       Subjective:   [name] [name] is a 83 y.o. male here today for establishment of care. He has a history of hypertension, reported heart attack, anemia, hypothyroidism and benign prostatic hypertrophy. He presented to the emergency department on 01/02/17 after a blackout. His family believes that he was just overly exhausted from his recent travel back from [place]. Episode did not have any dizziness or lightheadedness. No palpitations or chest pain. No shortness of breath. He was sitting on the toilet when this incident happened. He did not injure himself. In the emergency department his vital signs were stable. His sodium was a little low at 131. Creatinine was elevated at 1.3. Hemoglobin 11.5. Troponin negative. Chest x-ray with a left small effusion. CT of the head negative. EKG sinus rhythm. They offered him admission but declined. He was given IV fluids and sent home. He was given refills on his medications but in transit he lost all of his medications. He is requesting refills of his chronic medications.  No further episodes of syncope. No chest pain. No palpitations. No swelling in his legs. No chest pain. Out of chronic medications.  ROS: GEN: denies fever or chills, denies change in weight Skin: denies lesions or rashes HEENT: denies headache, earache, epistaxis, sore throat, or neck pain LUNGS: denies SHOB, dyspnea, PND, orthopnea CV: denies CP or palpitations ABD: denies abd pain, N or V EXT: denies muscle spasms or swelling; no pain in lower ext, no weakness NEURO: denies numbness or tingling, denies sz, stroke or TIA  ALLERGIES: No Known Allergies  PAST MEDICAL HISTORY: Past Medical History:  Diagnosis Date  . Back pain 10/2008   per MRI 11/06/2008 - severe spinal stenosis worse at L4-5, L3-4, with neural compression at those levels  . BPH  (benign prostatic hyperplasia)   . Eczema   . Gout   . Hypertension   . Hypothyroidism     PAST SURGICAL HISTORY: Past Surgical History:  Procedure Laterality Date  . LAMINECTOMY  12/11/2008    Bilateral L2, L3, and L4 laminotomies, done by Dr. Lovell Sheehan    MEDICATIONS AT HOME: Prior to Admission medications   Medication Sig Start Date End Date Taking? Authorizing Provider  allopurinol (ZYLOPRIM) 100 MG tablet Take 1 tablet (100 mg total) by mouth daily. 01/02/17  Yes Rolan Bucco, MD  ALPRAZolam Prudy Feeler) 0.5 MG tablet Take 0.5 mg by mouth 2 (two) times daily as needed.     Yes [provider]  aspirin 81 MG tablet Take 81 mg by mouth daily.     Yes [provider]  doxazosin (CARDURA) 4 MG tablet Take 1 tablet (4 mg total) by mouth at bedtime. 01/02/17  Yes Rolan Bucco, MD  hydrochlorothiazide (HYDRODIURIL) 25 MG tablet Take 1 tablet (25 mg total) by mouth daily. 01/02/17  Yes Rolan Bucco, MD  HYDROcodone-acetaminophen (VICODIN) 5-500 MG per tablet Take 1 tablet by mouth every 6 (six) hours as needed.     Yes [provider]  levothyroxine (SYNTHROID, LEVOTHROID) 50 MCG tablet Take 50 mcg by mouth daily.     Yes [provider]  lisinopril (PRINIVIL,ZESTRIL) 5 MG tablet Take 1 tablet (5 mg total) by mouth daily. 01/02/17  Yes Rolan Bucco, MD  multivitamin [facility]) per tablet Take 1 tablet by mouth daily.     Yes [provider]  Family History  Problem Relation Age of Onset  . Stroke Neg Hx   . Cancer Neg Hx    Social-Sudanese; 10 children; nonsmoker  Objective:   Vitals:   01/16/17 1139  BP: 113/65  Pulse: 80  Temp: (!) 97.5 F (36.4 C)  TempSrc: Oral  SpO2: 100%  Weight: 170 lb 3.2 oz (77.2 kg)   EXAM-benign General appearance : Awake, alert, not in any distress. Speech Clear. Not toxic looking HEENT: Atraumatic and Normocephalic, pupils equally reactive to light and accomodation Neck: supple, no JVD. No cervical  lymphadenopathy.  Chest:Good air entry bilaterally, no added sounds  CVS: S1 S2 regular, no murmurs.  Abdomen: Bowel sounds present, Non tender and not distended with no guarding, rigidity or rebound. Extremities: B/L Lower Ext shows no edema, both legs are warm to touch Neurology: Awake alert, and oriented X 3, CN II-XII intact, Non focal Skin:No Rash Wounds:N/A   Assessment & Plan  1. Syncope-?etiology-nonrecurrent  -Cards referral discussed but he does not feel the need at this time;  if recurrent>referral 2. HTN  -DASH diet   -refill meds  -BMP today 3. Anemia-stable 4. AKI  -BMP today   The patient was given clear instructions to go to ER or return to medical center if symptoms don't improve, worsen or new problems develop. The patient verbalized understanding. The patient was told to call to get lab results if they haven't heard anything in the next week.   Total time spent with patient was 17 min. Greater than 50 % of this visit was spent face to face counseling and coordinating care regarding risk factor modification, compliance importance and encouragement, education related to blood pressure and chronic illnesses.  This note has been created with Education officer, environmental. Any transcriptional errors are unintentional.    Scot Jun, PA-C [facility] [place], [place] [phone]   01/16/2017, 12:00 PM

## 2017-01-17 LAB — BASIC METABOLIC PANEL
BUN / CREAT RATIO: 35 — AB (ref 10–24)
BUN: 44 mg/dL — AB (ref 8–27)
CO2: 21 mmol/L (ref 20–29)
CREATININE: 1.24 mg/dL (ref 0.76–1.27)
Calcium: 9.7 mg/dL (ref 8.6–10.2)
Chloride: 98 mmol/L (ref 96–106)
GFR, EST AFRICAN AMERICAN: 63 mL/min/{1.73_m2} (ref >59)
GFR, EST NON AFRICAN AMERICAN: 54 mL/min/{1.73_m2} — AB (ref >59)
Glucose: 65 mg/dL (ref 65–99)
Potassium: 5 mmol/L (ref 3.5–5.2)
SODIUM: 133 mmol/L — AB (ref 134–144)

## 2017-01-18 ENCOUNTER — Telehealth: Payer: Self-pay | Admitting: General Practice

## 2017-01-18 NOTE — Telephone Encounter (Signed)
Pt. Son returned call and was told of lab results. He had no questions.

## 2017-01-24 ENCOUNTER — Ambulatory Visit: Payer: Self-pay | Attending: Internal Medicine

## 2017-01-24 ENCOUNTER — Encounter: Payer: Self-pay | Admitting: Internal Medicine

## 2017-01-24 ENCOUNTER — Ambulatory Visit: Payer: Self-pay | Attending: Internal Medicine | Admitting: Internal Medicine

## 2017-01-24 VITALS — BP 122/63 | HR 83 | Temp 98.2°F | Resp 16 | Wt 169.6 lb

## 2017-01-24 DIAGNOSIS — R55 Syncope and collapse: Secondary | ICD-10-CM | POA: Insufficient documentation

## 2017-01-24 DIAGNOSIS — Z7982 Long term (current) use of aspirin: Secondary | ICD-10-CM | POA: Insufficient documentation

## 2017-01-24 DIAGNOSIS — M109 Gout, unspecified: Secondary | ICD-10-CM | POA: Insufficient documentation

## 2017-01-24 DIAGNOSIS — Z23 Encounter for immunization: Secondary | ICD-10-CM | POA: Insufficient documentation

## 2017-01-24 DIAGNOSIS — Z8739 Personal history of other diseases of the musculoskeletal system and connective tissue: Secondary | ICD-10-CM | POA: Insufficient documentation

## 2017-01-24 DIAGNOSIS — W19XXXS Unspecified fall, sequela: Secondary | ICD-10-CM | POA: Insufficient documentation

## 2017-01-24 DIAGNOSIS — E039 Hypothyroidism, unspecified: Secondary | ICD-10-CM | POA: Insufficient documentation

## 2017-01-24 DIAGNOSIS — D649 Anemia, unspecified: Secondary | ICD-10-CM | POA: Insufficient documentation

## 2017-01-24 DIAGNOSIS — N400 Enlarged prostate without lower urinary tract symptoms: Secondary | ICD-10-CM | POA: Insufficient documentation

## 2017-01-24 DIAGNOSIS — E871 Hypo-osmolality and hyponatremia: Secondary | ICD-10-CM | POA: Insufficient documentation

## 2017-01-24 DIAGNOSIS — I10 Essential (primary) hypertension: Secondary | ICD-10-CM | POA: Insufficient documentation

## 2017-01-24 MED ORDER — LISINOPRIL 5 MG PO TABS: 5.0000 mg | ORAL_TABLET | Freq: Every day | ORAL | 3 refills | Status: DC

## 2017-01-24 MED ORDER — LEVOTHYROXINE SODIUM 50 MCG PO TABS: 50.0000 ug | ORAL_TABLET | Freq: Every day | ORAL | 3 refills | Status: DC

## 2017-01-24 MED ORDER — ALLOPURINOL 100 MG PO TABS: 100.0000 mg | ORAL_TABLET | Freq: Every day | ORAL | 3 refills | Status: DC

## 2017-01-24 MED ORDER — HYDROCHLOROTHIAZIDE 25 MG PO TABS: 25.0000 mg | ORAL_TABLET | Freq: Every day | ORAL | 3 refills | Status: DC

## 2017-01-24 MED ORDER — DOXAZOSIN MESYLATE 4 MG PO TABS: 4.0000 mg | ORAL_TABLET | Freq: Every day | ORAL | 3 refills | Status: DC

## 2017-01-24 MED FILL — DOXAZOSIN MESYLATE 4 MG TAB: 4 | 90 days supply | Qty: 90 | Fill #0

## 2017-01-24 NOTE — Patient Instructions (Addendum)
Follow up with [place] after you return from [place].  Go slow with position changes.   Pneumococcal Conjugate Vaccine (PCV13) What You Need to Know 1. Why get vaccinated? Vaccination can protect both children and adults from pneumococcal disease. Pneumococcal disease is caused by bacteria that can spread from person to person through close contact. It can cause ear infections, and it can also lead to more serious infections of the:  Lungs (pneumonia),  Blood (bacteremia), and  Covering of the brain and spinal cord (meningitis).  Pneumococcal pneumonia is most common among adults. Pneumococcal meningitis can cause deafness and brain damage, and it kills about 1 child in 10 who get it. Anyone can get pneumococcal disease, but children under 67 years of age and adults 41 years and older, people with certain medical conditions, and cigarette smokers are at the highest risk. Before there was a vaccine, the [place] saw:  more than 700 cases of meningitis,  about 13,000 blood infections,  about 5 million ear infections, and  about 200 deaths  in children under 5 each year from pneumococcal disease. Since vaccine became available, severe pneumococcal disease in these children has fallen by 88%. About 18,000 older adults die of pneumococcal disease each year in the [place]. Treatment of pneumococcal infections with penicillin and other drugs is not as effective as it used to be, because some strains of the disease have become resistant to these drugs. This makes prevention of the disease, through vaccination, even more important. 2. PCV13 vaccine Pneumococcal conjugate vaccine (called PCV13) protects against 13 types of pneumococcal bacteria. PCV13 is routinely given to children at 2, 4, 6, and 60-67 months of age. It is also recommended for children and adults 23 to 25 years of age with certain health conditions, and for all adults 73 years of age and older. Your doctor can give you  details. 3. Some people should not get this vaccine Anyone who has ever had a life-threatening allergic reaction to a dose of this vaccine, to an earlier pneumococcal vaccine called PCV7, or to any vaccine containing diphtheria toxoid (for example, DTaP), should not get PCV13. Anyone with a severe allergy to any component of PCV13 should not get the vaccine. Tell your doctor if the person being vaccinated has any severe allergies. If the person scheduled for vaccination is not feeling well, your healthcare provider might decide to reschedule the shot on another day. 4. Risks of a vaccine reaction With any medicine, including vaccines, there is a chance of reactions. These are usually mild and go away on their own, but serious reactions are also possible. Problems reported following PCV13 varied by age and dose in the series. The most common problems reported among children were:  About half became drowsy after the shot, had a temporary loss of appetite, or had redness or tenderness where the shot was given.  About 1 out of 3 had swelling where the shot was given.  About 1 out of 3 had a mild fever, and about 1 in 20 had a fever over 102.60F.  Up to about 8 out of 10 became fussy or irritable.  Adults have reported pain, redness, and swelling where the shot was given; also mild fever, fatigue, headache, chills, or muscle pain. Young children who get PCV13 along with inactivated flu vaccine at the same time may be at increased risk for seizures caused by fever. Ask your doctor for more information. Problems that could happen after any vaccine:  People sometimes faint  after a medical procedure, including vaccination. Sitting or lying down for about 15 minutes can help prevent fainting, and injuries caused by a fall. Tell your doctor if you feel dizzy, or have vision changes or ringing in the ears.  Some older children and adults get severe pain in the shoulder and have difficulty moving the arm  where a shot was given. This happens very rarely.  Any medication can cause a severe allergic reaction. Such reactions from a vaccine are very rare, estimated at about 1 in a million doses, and would happen within a few minutes to a few hours after the vaccination. As with any medicine, there is a very small chance of a vaccine causing a serious injury or death. The safety of vaccines is always being monitored. For more information, visit: http://floyd.org/ 5. What if there is a serious reaction? What should I look for? Look for anything that concerns you, such as signs of a severe allergic reaction, very high fever, or unusual behavior. Signs of a severe allergic reaction can include hives, swelling of the face and throat, difficulty breathing, a fast heartbeat, dizziness, and weakness-usually within a few minutes to a few hours after the vaccination. What should I do?  If you think it is a severe allergic reaction or other emergency that can't wait, call 9-1-1 or get the person to the nearest hospital. Otherwise, call your doctor.  Reactions should be reported to the Vaccine Adverse Event Reporting System (VAERS). Your doctor should file this report, or you can do it yourself through the VAERS web site at www.vaers.LAgents.no, or by calling 1-248-165-3085. ? VAERS does not give medical advice. 6. The National Vaccine Injury Compensation Program The [employer] Vaccine Injury Compensation Program (VICP) is a federal program that was created to compensate people who may have been injured by certain vaccines. Persons who believe they may have been injured by a vaccine can learn about the program and about filing a claim by calling 1-442-861-5376 or visiting the VICP website at SpiritualWord.at. There is a time limit to file a claim for compensation. 7. How can I learn more?  Ask your healthcare provider. He or she can give you the vaccine package insert or suggest other sources  of information.  Call your local or state health department.  Contact the Centers for Disease Control and Prevention (CDC): ? Call 256-375-7249 (1-800-CDC-INFO) or ? Visit CDC's website at PicCapture.uy Vaccine Information Statement, PCV13 Vaccine (05/01/2014) This information is not intended to replace advice given to you by your health care provider. Make sure you discuss any questions you have with your health care provider. Document Released: 04/10/2006 Document Revised: 03/03/2016 Document Reviewed: 03/03/2016 Elsevier Interactive Patient Education  2017 [employer].

## 2017-01-24 NOTE — Progress Notes (Signed)
Patient ID: [name] [name], male    DOB: 02/03/1934  MRN: [id number]  CC: Establish Care and Hypertension   Subjective: [name] [name] is a 83 y.o. male who presents for f/u.  Son, [name] [name], is with him. His concerns today include:  83 year old with history of HTN, hypothyroidism, BPH, normocytic anemia, gout and questionable history of heart disease who was seen by our PA on 7/23 post ER visit for syncope. Please refer to her note for details of that visit.  1. Syncope -Episode that sent him to the emergency room occurred when he stood up after using the toilet. Patient states he was trying to grab onto a hand rail to help him stand but ended up falling. -no dizziness, palpitations or LOC Normally independent with ADLs. -No further episodes -Labs revealed mild hyponatremia with some renal insufficiency both of which improved on repeat BMP 1 week ago  2. HTN: compliant with meds -limits salt BMP kidney function better  3. Hypothyroid -compliant with Levothyroxine  4.  Anemia: He is not sure whether iron studies were checked in the past. Currently not taking any iron supplement No blood in stools  In regards with a questionable history of heart disease patient states he was admitted to the hospital in [place] about 2 months ago for abdominal pain. He is not aware of having had a heart attack or any procedures on his heart. However patient's son states that the doctor told the patient's wife that he may have had one. EKG from recent ER visit at [facility] was normal. Going to [place] next week. Requests three-month supply of his medications. Will be back in 6 mths  Patient Active Problem List   Diagnosis Date Noted  . HYPOTHYROIDISM 10/30/2008  . ANEMIA, NORMOCYTIC 09/18/2008  . HYPERTENSION 09/18/2008  . BENIGN PROSTATIC HYPERTROPHY, WITH URINARY OBSTRUCTION 09/18/2008  . BACK PAIN, CHRONIC 09/18/2008     Current Outpatient Prescriptions on File Prior to Visit  Medication Sig Dispense  Refill  . aspirin 81 MG tablet Take 1 tablet (81 mg total) by mouth daily. 30 tablet 3   No current facility-administered medications on file prior to visit.     No Known Allergies  Social History   Social History  . Marital status: Married    Spouse name: N/A  . Number of children: N/A  . Years of education: N/A   Occupational History  . Not on file.   Social History Main Topics  . Smoking status: Never Smoker  . Smokeless tobacco: Never Used  . Alcohol use No  . Drug use: No  . Sexual activity: Not on file   Other Topics Concern  . Not on file   Social History Narrative  . No narrative on file    Family History  Problem Relation Age of Onset  . Stroke Neg Hx   . Cancer Neg Hx     Past Surgical History:  Procedure Laterality Date  . LAMINECTOMY  12/11/2008    Bilateral L2, L3, and L4 laminotomies, done by Dr. Lovell [name]    ROS: Review of Systems  Constitutional: Negative for appetite change, fatigue and unexpected weight change.  Respiratory: Negative for cough.   Cardiovascular: Negative for chest pain and leg swelling.  Genitourinary: Negative for difficulty urinating.  Neurological: Negative for dizziness, light-headedness and headaches.    PHYSICAL EXAM: BP 122/63   Pulse 83   Temp 98.2 F (36.8 C) (Oral)   Resp 16   Wt 169 lb 9.6 oz (  76.9 kg)   SpO2 96%   BMI 23.00 kg/m   Wt Readings from Last 3 Encounters:  01/24/17 169 lb 9.6 oz (76.9 kg)  01/16/17 170 lb 3.2 oz (77.2 kg)  01/02/17 166 lb 14.2 oz (75.7 kg)   Physical Exam  Constitutional: He is oriented to person, place, and time. He appears well-developed. No distress.  HENT:  Mouth/Throat: Oropharynx is clear and moist.  Eyes: Pupils are equal, round, and reactive to light. Conjunctivae are normal.  Neck: Neck supple. No thyromegaly present.  Cardiovascular: Normal rate and normal heart sounds.  Exam reveals no gallop and no friction rub.   No murmur heard. Pulmonary/Chest: Effort  normal and breath sounds normal.  Abdominal: Soft. Bowel sounds are normal. There is no tenderness.  Lymphadenopathy:    He has no cervical adenopathy.  Neurological: He is alert and oriented to person, place, and time.  Psychiatric: Thought content normal.      Chemistry      Component Value Date/Time   NA 133 (L) 01/16/2017 1225   K 5.0 01/16/2017 1225   CL 98 01/16/2017 1225   CO2 21 01/16/2017 1225   BUN 44 (H) 01/16/2017 1225   CREATININE 1.24 01/16/2017 1225   GLU 87 09/18/2008      Component Value Date/Time   CALCIUM 9.7 01/16/2017 1225   ALKPHOS 66 10/18/2008 1700   AST 23 10/18/2008 1700   ALT 14 10/18/2008 1700   BILITOT 0.8 10/18/2008 1700      Depression screen PHQ 2/9 01/24/2017  Decreased Interest 0  Down, Depressed, Hopeless 0  PHQ - 2 Score 0  Altered sleeping 0  Tired, decreased energy 0  Change in appetite 0  Feeling bad or failure about yourself  0  Trouble concentrating 0  Moving slowly or fidgety/restless 0  Suicidal thoughts 0  PHQ-9 Score 0   ASSESSMENT AND PLAN: 1. Essential hypertension -at goal.  May consider changing HCTZ in the future if hyponatremia remains an issue but for now it has improved - hydrochlorothiazide (HYDRODIURIL) 25 MG tablet; Take 1 tablet (25 mg total) by mouth daily.  Dispense: 90 tablet; Refill: 3 - lisinopril (PRINIVIL,ZESTRIL) 5 MG tablet; Take 1 tablet (5 mg total) by mouth daily.  Dispense: 90 tablet; Refill: 3  2. Normochromic anemia - Iron, TIBC and Ferritin Panel - Vitamin B12  3. Hypothyroidism, unspecified type Continue levothyroxine. - levothyroxine (SYNTHROID, LEVOTHROID) 50 MCG tablet; Take 1 tablet (50 mcg total) by mouth daily before breakfast.  Dispense: 90 tablet; Refill: 3   4. Fall, sequela -No further falls. Patient independent in ADLs and ambulation. Advised to go slow with position changes given that he is on blood pressure medication and also on Cardura 5. Hyponatremia See #1 above  6.  Need for vaccination against Streptococcus pneumoniae using pneumococcal conjugate vaccine 13 - Pneumococcal conjugate vaccine 13-valent IM  7. Hx of gout - allopurinol (ZYLOPRIM) 100 MG tablet; Take 1 tablet (100 mg total) by mouth daily.  Dispense: 90 tablet; Refill: 3  8. Benign prostatic hyperplasia without lower urinary tract symptoms - doxazosin (CARDURA) 4 MG tablet; Take 1 tablet (4 mg total) by mouth at bedtime.  Dispense: 90 tablet; Refill: 3  Patient was given the opportunity to ask questions.  Patient verbalized understanding of the plan and was able to repeat key elements of the plan.   Orders Placed This Encounter  Procedures  . Pneumococcal conjugate vaccine 13-valent IM  . Iron, TIBC and Ferritin Panel  .  Vitamin B12     Requested Prescriptions   Signed Prescriptions Disp Refills  . allopurinol (ZYLOPRIM) 100 MG tablet 90 tablet 3    Sig: Take 1 tablet (100 mg total) by mouth daily.  Marland Kitchen doxazosin (CARDURA) 4 MG tablet 90 tablet 3    Sig: Take 1 tablet (4 mg total) by mouth at bedtime.  . hydrochlorothiazide (HYDRODIURIL) 25 MG tablet 90 tablet 3    Sig: Take 1 tablet (25 mg total) by mouth daily.  Marland Kitchen lisinopril (PRINIVIL,ZESTRIL) 5 MG tablet 90 tablet 3    Sig: Take 1 tablet (5 mg total) by mouth daily.  Marland Kitchen levothyroxine (SYNTHROID, LEVOTHROID) 50 MCG tablet 90 tablet 3    Sig: Take 1 tablet (50 mcg total) by mouth daily before breakfast.    Return in about 5 months (around 06/26/2017).  Jonah Blue, MD, FACP

## 2017-01-25 ENCOUNTER — Telehealth: Payer: Self-pay | Admitting: Internal Medicine

## 2017-01-25 LAB — IRON,TIBC AND FERRITIN PANEL
Ferritin: 280 ng/mL (ref 30–400)
IRON: 60 ug/dL (ref 38–169)
Iron Saturation: 24 % (ref 15–55)
TIBC: 252 ug/dL (ref 250–450)
UIBC: 192 ug/dL (ref 111–343)

## 2017-01-25 LAB — VITAMIN B12: Vitamin B-12: 433 pg/mL (ref 232–1245)

## 2017-01-25 NOTE — Telephone Encounter (Signed)
PC placed to pt today to go over lab results from yesterday.  I left message mail that iron studies look ok but would recommend taking an iron supplement 3 x a wk and can purchase over the counter. His iron studies look c/w ACD. B12 level was good.  Results for orders placed or performed in visit on 01/24/17  Iron, TIBC and Ferritin Panel  Result Value Ref Range   Total Iron Binding Capacity 252 250 - 450 ug/dL   UIBC 542 706 - 237 ug/dL   Iron 60 38 - 628 ug/dL   Iron Saturation 24 15 - 55 %   Ferritin 280 30 - 400 ng/mL  Vitamin B12  Result Value Ref Range   Vitamin B-12 433 232 - 1,245 pg/mL

## 2017-01-27 MED FILL — LISINOPRIL 5 MG TAB: 5 | 90 days supply | Qty: 90 | Fill #0

## 2017-01-27 MED FILL — HYDROCHLOROTHIAZIDE 25 MG T: 25 | 90 days supply | Qty: 90 | Fill #0

## 2017-01-27 MED FILL — LEVOTHYROXINE 50 MCG TABLET: 50 | 90 days supply | Qty: 90 | Fill #0

## 2021-12-03 ENCOUNTER — Emergency Department (HOSPITAL_COMMUNITY)
Admission: EM | Admit: 2021-12-03 | Discharge: 2021-12-04 | Disposition: A | Discharge status: Home / Self Care | Payer: Self-pay | Attending: Emergency Medicine | Admitting: Emergency Medicine

## 2021-12-03 ENCOUNTER — Emergency Department (HOSPITAL_COMMUNITY): Payer: Self-pay

## 2021-12-03 ENCOUNTER — Other Ambulatory Visit: Payer: Self-pay

## 2021-12-03 ENCOUNTER — Ambulatory Visit (HOSPITAL_COMMUNITY)
Admission: EM | Admit: 2021-12-03 | Discharge: 2021-12-03 | Disposition: A | Discharge status: Home / Self Care | Payer: Self-pay

## 2021-12-03 ENCOUNTER — Encounter (HOSPITAL_COMMUNITY): Payer: Self-pay | Admitting: Emergency Medicine

## 2021-12-03 ENCOUNTER — Encounter (HOSPITAL_COMMUNITY): Payer: Self-pay

## 2021-12-03 DIAGNOSIS — Z7982 Long term (current) use of aspirin: Secondary | ICD-10-CM | POA: Insufficient documentation

## 2021-12-03 DIAGNOSIS — R42 Dizziness and giddiness: Secondary | ICD-10-CM | POA: Insufficient documentation

## 2021-12-03 DIAGNOSIS — E875 Hyperkalemia: Secondary | ICD-10-CM | POA: Insufficient documentation

## 2021-12-03 DIAGNOSIS — R600 Localized edema: Secondary | ICD-10-CM

## 2021-12-03 LAB — URINALYSIS, ROUTINE W REFLEX MICROSCOPIC
Bacteria, UA: NONE SEEN
Bilirubin Urine: NEGATIVE
Glucose, UA: NEGATIVE mg/dL
Ketones, ur: NEGATIVE mg/dL
Nitrite: NEGATIVE
Protein, ur: NEGATIVE mg/dL
Specific Gravity, Urine: 1.013 (ref 1.005–1.030)
pH: 5 (ref 5.0–8.0)

## 2021-12-03 LAB — CBC
HCT: 31 % — ABNORMAL LOW (ref 39.0–52.0)
Hemoglobin: 9.8 g/dL — ABNORMAL LOW (ref 13.0–17.0)
MCH: 28.5 pg (ref 26.0–34.0)
MCHC: 31.6 g/dL (ref 30.0–36.0)
MCV: 90.1 fL (ref 80.0–100.0)
Platelets: 248 10*3/uL (ref 150–400)
RBC: 3.44 MIL/uL — ABNORMAL LOW (ref 4.22–5.81)
RDW: 17.4 % — ABNORMAL HIGH (ref 11.5–15.5)
WBC: 6.4 10*3/uL (ref 4.0–10.5)
nRBC: 0 % (ref 0.0–0.2)

## 2021-12-03 LAB — COMPREHENSIVE METABOLIC PANEL
ALT: 5 U/L (ref 0–44)
AST: 28 U/L (ref 15–41)
Albumin: 2.9 g/dL — ABNORMAL LOW (ref 3.5–5.0)
Alkaline Phosphatase: 56 U/L (ref 38–126)
Anion gap: 6 (ref 5–15)
BUN: 29 mg/dL — ABNORMAL HIGH (ref 8–23)
CO2: 23 mmol/L (ref 22–32)
Calcium: 9.1 mg/dL (ref 8.9–10.3)
Chloride: 106 mmol/L (ref 98–111)
Creatinine, Ser: 1.44 mg/dL — ABNORMAL HIGH (ref 0.61–1.24)
GFR, Estimated: 47 mL/min — ABNORMAL LOW (ref >60)
Glucose, Bld: 72 mg/dL (ref 70–99)
Potassium: 5.8 mmol/L — ABNORMAL HIGH (ref 3.5–5.1)
Sodium: 135 mmol/L (ref 135–145)
Total Bilirubin: 0.6 mg/dL (ref 0.3–1.2)
Total Protein: 6.4 g/dL — ABNORMAL LOW (ref 6.5–8.1)

## 2021-12-03 LAB — POCT URINALYSIS DIPSTICK, ED / UC
Bilirubin Urine: NEGATIVE
Bilirubin Urine: NEGATIVE
Glucose, UA: NEGATIVE mg/dL
Glucose, UA: NEGATIVE mg/dL
Ketones, ur: NEGATIVE mg/dL
Ketones, ur: NEGATIVE mg/dL
Leukocytes,Ua: NEGATIVE
Leukocytes,Ua: NEGATIVE
Nitrite: NEGATIVE
Nitrite: NEGATIVE
Protein, ur: NEGATIVE mg/dL
Protein, ur: NEGATIVE mg/dL
Specific Gravity, Urine: 1.02 (ref 1.005–1.030)
Specific Gravity, Urine: 1.01 (ref 1.005–1.030)
Urobilinogen, UA: 0.2 mg/dL (ref 0.0–1.0)
Urobilinogen, UA: 0.2 mg/dL (ref 0.0–1.0)
pH: 5 (ref 5.0–8.0)
pH: 5 (ref 5.0–8.0)

## 2021-12-03 LAB — BRAIN NATRIURETIC PEPTIDE: B Natriuretic Peptide: 131.7 pg/mL — ABNORMAL HIGH (ref 0.0–100.0)

## 2021-12-03 NOTE — ED Triage Notes (Signed)
Dizziness for 2 weeks. Patient has been falling as well. Blood pressure at home was 100/48. No head injuries with falls.   Patient is having pain with urination.   Both ankles are swollen with dry skin. Pain so great in the foot p can not walk.   Patient also has a history of anemia.

## 2021-12-03 NOTE — ED Triage Notes (Signed)
Pt reported to ED from UC after having ongoing dizziness x2 weeks. Pt has fallen several times. Pt's daughter states pts feet are swollen and turning black and that pt has been febrile as well and that pt has been experiencing shortness of breath.

## 2021-12-03 NOTE — ED Provider Triage Note (Signed)
Emergency Medicine Provider Triage Evaluation Note  [name] [name] , a 88 y.o. male  was evaluated in triage.  Patient presenting with English-speaking family member with multiple complaints.  Reports that he just got to the [place] yesterday.  For multiple months he has been falling down, unknown cause.  Also has had this dry flaky and lower extremities skin and was told that the eczema.  Creams that he was being given are not helping.  Also dyspneic on exertion  Review of Systems  Positive: Difficulty urinating (recorded BPH), dizziness, falls, lower extremity problem Negative:   Physical Exam  BP (!) 124/59 (BP Location: Left Arm)   Pulse 85   Temp 97.8 F (36.6 C) (Oral)   Resp 18   SpO2 96%  Gen:   Awake, no distress   Resp:  Normal effort  MSK:   Moves extremities without difficulty  Other:  Doppler over DP pulses bilaterally.  Strong and biphasic.  Sensation intact.  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  5 out of 5 strength to finger grip bilaterally.  RRR, mild wheezing in right lung field  Medical Decision Making  Medically screening exam initiated at 8:26 PM.  Appropriate orders placed.  Fountain Liceaga was informed that the remainder of the evaluation will be completed by another provider, this initial triage assessment does not replace that evaluation, and the importance of remaining in the ED until their evaluation is complete.     Rhae Hammock, PA-C 12/03/21 2028

## 2021-12-03 NOTE — ED Provider Notes (Signed)
[facility]    CSN: [id number] Arrival date & time: 12/03/21  1833      History   Chief Complaint Chief Complaint  Patient presents with   Dizziness   Dysuria    HPI [name] [name] is a 88 y.o. male.   Patient presents to urgent care for evaluation of dizziness and dysuria with his son and daughter-in-law.  There is a language barrier and son is fluent in [place] and providing most of the history while also translating for the patient.  Patient fell today in the hallway, but did not hit his head.  Prior to falling, he became dizzy and lost his balance.  Patient walks with a cane and is generally weak at baseline.  He has a significant past medical history of anemia  Rash to bilateral lower extremities  No cough Increasing shortness of breath No chest pain Dysuria Dizziness Urine was negative.     Past Medical History:  Diagnosis Date   Back pain 10/2008   per MRI 11/06/2008 - severe spinal stenosis worse at L4-5, L3-4, with neural compression at those levels   BPH (benign prostatic hyperplasia)    Eczema    Gout    Hypertension    Hypothyroidism     Patient Active Problem List   Diagnosis Date Noted   Hx of gout 01/24/2017   HYPOTHYROIDISM 10/30/2008   ANEMIA, NORMOCYTIC 09/18/2008   HYPERTENSION 09/18/2008   BENIGN PROSTATIC HYPERTROPHY, WITH URINARY OBSTRUCTION 09/18/2008   BACK PAIN, CHRONIC 09/18/2008    Past Surgical History:  Procedure Laterality Date   LAMINECTOMY  12/11/2008    Bilateral L2, L3, and L4 laminotomies, done by Dr. Lovell Sheehan       Home Medications    Prior to Admission medications   Medication Sig Start Date End Date Taking? Authorizing Provider  allopurinol (ZYLOPRIM) 100 MG tablet Take 1 tablet (100 mg total) by mouth daily. 01/24/17  Yes Marcine Matar, MD  aspirin 81 MG tablet Take 1 tablet (81 mg total) by mouth daily. 01/16/17  Yes Danelle Earthly, Tiffany S, PA-C  atorvastatin (LIPITOR) 20 MG tablet Take 20 mg by mouth  daily.   Yes [provider]  BISOPROLOL FUMARATE PO Take 5 mg by mouth.   Yes [provider]  doxazosin (CARDURA) 4 MG tablet Take 1 tablet (4 mg total) by mouth at bedtime. 01/24/17  Yes Marcine Matar, MD  furosemide (LASIX) 40 MG tablet Take 40 mg by mouth.   Yes [provider]  hydrochlorothiazide (HYDRODIURIL) 25 MG tablet Take 1 tablet (25 mg total) by mouth daily. 01/24/17  Yes Marcine Matar, MD  levothyroxine (SYNTHROID, LEVOTHROID) 50 MCG tablet Take 1 tablet (50 mcg total) by mouth daily before breakfast. 01/24/17  Yes Marcine Matar, MD  lisinopril (PRINIVIL,ZESTRIL) 5 MG tablet Take 1 tablet (5 mg total) by mouth daily. 01/24/17  Yes Marcine Matar, MD  NON FORMULARY Heptaminol drops   Yes [provider]    Family History Family History  Problem Relation Age of Onset   Stroke Neg Hx    Cancer Neg Hx     Social History Social History   Tobacco Use   Smoking status: Never   Smokeless tobacco: Never  Substance Use Topics   Alcohol use: No   Drug use: No     Allergies   Patient has no known allergies.   Review of Systems Review of Systems   Physical Exam Triage Vital Signs  ED Triage Vitals  Enc Vitals Group     BP 12/03/21 1847 (!) 127/59     Pulse Rate 12/03/21 1847 87     Resp 12/03/21 1847 16     Temp 12/03/21 1847 98.1 F (36.7 C)     Temp Source 12/03/21 1847 Oral     SpO2 12/03/21 1847 100 %     Weight --      Height --      Head Circumference --      Peak Flow --      Pain Score 12/03/21 1842 9     Pain Loc --      Pain Edu? --      Excl. in GC? --    No data found.  Updated Vital Signs BP (!) 127/59 (BP Location: Right Arm)   Pulse 87   Temp 98.1 F (36.7 C) (Oral)   Resp 16   SpO2 100%   Visual Acuity Right Eye Distance:   Left Eye Distance:   Bilateral Distance:    Right Eye Near:   Left Eye Near:    Bilateral Near:     Physical Exam      UC Treatments / Results   Labs (all labs ordered are listed, but only abnormal results are displayed) Labs Reviewed  POCT URINALYSIS DIPSTICK, ED / UC - Abnormal; Notable for the following components:      Result Value   Hgb urine dipstick TRACE (*)    All other components within normal limits  POCT URINALYSIS DIPSTICK, ED / UC - Abnormal; Notable for the following components:   Hgb urine dipstick TRACE (*)    All other components within normal limits    EKG   Radiology No results found.  Procedures Procedures (including critical care time)  Medications Ordered in UC Medications - No data to display  Initial Impression / Assessment and Plan / UC Course  I have reviewed the triage vital signs and the nursing notes.  Pertinent labs & imaging results that were available during my care of the patient were reviewed by me and considered in my medical decision making (see chart for details).     *** Final Clinical Impressions(s) / UC Diagnoses   Final diagnoses:  None   Discharge Instructions   None    ED Prescriptions   None    PDMP not reviewed this encounter.

## 2021-12-03 NOTE — Discharge Instructions (Addendum)
Please go to the nearest emergency department for further evaluation.  Lower leg swelling is very concerning and needs to be evaluated emergently.

## 2021-12-03 NOTE — ED Notes (Signed)
Called for vitals and no response 

## 2021-12-04 LAB — I-STAT CHEM 8, ED
BUN: 25 mg/dL — ABNORMAL HIGH (ref 8–23)
Calcium, Ion: 1.3 mmol/L (ref 1.15–1.40)
Chloride: 102 mmol/L (ref 98–111)
Creatinine, Ser: 1.3 mg/dL — ABNORMAL HIGH (ref 0.61–1.24)
Glucose, Bld: 84 mg/dL (ref 70–99)
HCT: 32 % — ABNORMAL LOW (ref 39.0–52.0)
Hemoglobin: 10.9 g/dL — ABNORMAL LOW (ref 13.0–17.0)
Potassium: 4.5 mmol/L (ref 3.5–5.1)
Sodium: 138 mmol/L (ref 135–145)
TCO2: 27 mmol/L (ref 22–32)

## 2021-12-04 LAB — TSH: TSH: 2.021 u[IU]/mL (ref 0.350–4.500)

## 2021-12-04 MED ORDER — LACTATED RINGERS IV BOLUS
1000.0000 mL | Freq: Once | INTRAVENOUS | Status: AC
  Administered 2021-12-04: 1000 mL via INTRAVENOUS

## 2021-12-04 MED ORDER — SODIUM ZIRCONIUM CYCLOSILICATE 10 G PO PACK
10.0000 g | PACK | Freq: Once | ORAL | Status: AC
Start: 2021-12-04 — End: 2021-12-04
  Administered 2021-12-04: 10 g via ORAL
  Filled 2021-12-04: qty 1

## 2021-12-04 NOTE — ED Provider Notes (Signed)
[facility] EMERGENCY DEPARTMENT Provider Note   CSN: [id number] Arrival date & time: 12/03/21  2001     History  Chief Complaint  Patient presents with   Dizziness    [name] [name] is a 88 y.o. male.  88 year old male with multiple medical problems. Been in [place] for last few years so unsure of medical care there. Has had multiple falls and fell today as well. Has swelling to both feet with some venous stasis as well. Also difficulty urinating. Also feels unwell often. Reportedly has all of his medications and takes them as prescribed.    Dizziness      Home Medications Prior to Admission medications   Medication Sig Start Date End Date Taking? Authorizing Provider  allopurinol (ZYLOPRIM) 100 MG tablet Take 1 tablet (100 mg total) by mouth daily. 01/24/17   Ladell Pier, MD  aspirin 81 MG tablet Take 1 tablet (81 mg total) by mouth daily. 01/16/17   Brayton Caves, PA-C  atorvastatin (LIPITOR) 20 MG tablet Take 20 mg by mouth daily.    [provider]  BISOPROLOL FUMARATE PO Take 5 mg by mouth.    [provider]  doxazosin (CARDURA) 4 MG tablet Take 1 tablet (4 mg total) by mouth at bedtime. 01/24/17   Ladell Pier, MD  furosemide (LASIX) 40 MG tablet Take 40 mg by mouth.    [provider]  hydrochlorothiazide (HYDRODIURIL) 25 MG tablet Take 1 tablet (25 mg total) by mouth daily. 01/24/17   Ladell Pier, MD  levothyroxine (SYNTHROID, LEVOTHROID) 50 MCG tablet Take 1 tablet (50 mcg total) by mouth daily before breakfast. 01/24/17   Ladell Pier, MD  lisinopril (PRINIVIL,ZESTRIL) 5 MG tablet Take 1 tablet (5 mg total) by mouth daily. 01/24/17   Ladell Pier, MD  NON FORMULARY Heptaminol drops    [provider]      Allergies    Patient has no known allergies.    Review of Systems   Review of Systems  Neurological:  Positive for dizziness.    Physical Exam Updated Vital Signs BP (!) 171/68    Pulse 91   Temp (!) 97.5 F (36.4 C) (Oral)   Resp 14   SpO2 100%  Physical Exam Vitals and nursing note reviewed.  Constitutional:      Appearance: He is well-developed.  HENT:     Head: Normocephalic and atraumatic.     Mouth/Throat:     Mouth: Mucous membranes are dry.  Eyes:     Pupils: Pupils are equal, round, and reactive to light.  Cardiovascular:     Rate and Rhythm: Normal rate.  Pulmonary:     Effort: Pulmonary effort is normal. No respiratory distress.  Abdominal:     General: There is no distension.  Musculoskeletal:        General: Normal range of motion.     Cervical back: Normal range of motion.  Skin:    General: Skin is warm and dry.     Comments: Significant flaky skin and changes c/w venous stasis in bilateral feet and up to mid shin.   Neurological:     General: No focal deficit present.     Mental Status: He is alert.     ED Results / Procedures / Treatments   Labs (all labs ordered are listed, but only abnormal results are displayed) Labs Reviewed  URINALYSIS, ROUTINE W REFLEX MICROSCOPIC - Abnormal; Notable for the following components:  Result Value   Hgb urine dipstick SMALL (*)    Leukocytes,Ua TRACE (*)    All other components within normal limits  COMPREHENSIVE METABOLIC PANEL - Abnormal; Notable for the following components:   Potassium 5.8 (*)    BUN 29 (*)    Creatinine, Ser 1.44 (*)    Total Protein 6.4 (*)    Albumin 2.9 (*)    GFR, Estimated 47 (*)    All other components within normal limits  BRAIN NATRIURETIC PEPTIDE - Abnormal; Notable for the following components:   B Natriuretic Peptide 131.7 (*)    All other components within normal limits  CBC - Abnormal; Notable for the following components:   RBC 3.44 (*)    Hemoglobin 9.8 (*)    HCT 31.0 (*)    RDW 17.4 (*)    All other components within normal limits  I-STAT CHEM 8, ED - Abnormal; Notable for the following components:   BUN 25 (*)    Creatinine, Ser 1.30 (*)     Hemoglobin 10.9 (*)    HCT 32.0 (*)    All other components within normal limits  TSH  CBG MONITORING, ED    EKG EKG Interpretation  Date/Time:  Saturday December 04 2021 01:26:51 EDT Ventricular Rate:  75 PR Interval:  183 QRS Duration: 72 QT Interval:  395 QTC Calculation: 442 R Axis:   77 Text Interpretation: Sinus rhythm Confirmed by Merrily Pew [phone]) on 12/04/2021 2:18:19 AM  Radiology CT Head Wo Contrast  Result Date: 12/03/2021 CLINICAL DATA:  Head trauma, fall. EXAM: CT HEAD WITHOUT CONTRAST TECHNIQUE: Contiguous axial images were obtained from the base of the skull through the vertex without intravenous contrast. RADIATION DOSE REDUCTION: This exam was performed according to the departmental dose-optimization program which includes automated exposure control, adjustment of the mA and/or kV according to patient size and/or use of iterative reconstruction technique. COMPARISON:  01/02/2017. FINDINGS: Brain: No acute intracranial hemorrhage, midline shift or mass effect. No extra-axial fluid collection. Generalized atrophy is noted. Periventricular white matter hypodensities are noted bilaterally. No hydrocephalus. Vascular: Atherosclerotic calcification of the carotid siphons and vertebral arteries. No hyperdense vessel. Skull: Normal. A stable round sclerotic lesion is noted in the anterior wall of the left maxillary sinus, unchanged. Sinuses/Orbits: No acute finding. Other: Few opacities are noted in the mastoid air cells bilaterally. IMPRESSION: 1. No acute intracranial process. 2. Atrophy with chronic microvascular ischemic changes. Electronically Signed   By: Brett Fairy M.D.   On: 12/03/2021 22:28   DG Chest 1 View  Result Date: 12/03/2021 CLINICAL DATA:  Shortness of breath and dizziness. EXAM: CHEST  1 VIEW COMPARISON:  Chest radiograph dated 10/25/2008. FINDINGS: The heart size and mediastinal contours are within normal limits. Both lungs are clear. The visualized  skeletal structures are unremarkable. IMPRESSION: No active disease. Electronically Signed   By: Anner Crete M.D.   On: 12/03/2021 21:22    Procedures Procedures    Medications Ordered in ED Medications  lactated ringers bolus 1,000 mL (0 mLs Intravenous Stopped 12/04/21 0300)  sodium zirconium cyclosilicate (LOKELMA) packet 10 g (10 g Oral Given 12/04/21 0143)    ED Course/ Medical Decision Making/ A&P                           Medical Decision Making Amount and/or Complexity of Data Reviewed Labs: ordered.  Risk Prescription drug management.  Mild AKI with hyperkalemia. Will give some fluids with  lokelma and recheck. Needs PCP follow up, will engage TOC to ensure he gets timely follow up. K improved. Will continue hydration. Stable for d/c.    Final Clinical Impression(s) / ED Diagnoses Final diagnoses:  Dizziness    Rx / DC Orders ED Discharge Orders     None         Martavis Gurney, Corene Cornea, MD 12/04/21 [phone]

## 2021-12-04 NOTE — ED Notes (Addendum)
E-signature pad unavailable at time of pt discharge. This RN discussed discharge materials with pt and pt's son, and answered all questions. Pt & son stated understanding of discharge material.

## 2021-12-06 ENCOUNTER — Ambulatory Visit: Payer: Self-pay | Admitting: *Deleted

## 2021-12-06 NOTE — Telephone Encounter (Signed)
  Chief Complaint: Son called in for pt.   They are from [place] and he is interpreting.   Both feet are swollen and very dark from poor circulation per Dr. In the ED.   Seen Sat. In ED. Symptoms: Both feet painful, swollen and very dark in color. Frequency: N/A Pertinent Negatives: Patient denies N/A Disposition: [] ED /[] Urgent Care (no appt availability in office) / [] Appointment(In office/virtual)/ []  [facility]/ [] Home Care/ [] Refused Recommended Disposition /[x] [facility]/ []  Follow-up with PCP Additional Notes: They are going  to the [facility] 12/07/2021.

## 2021-12-06 NOTE — Telephone Encounter (Signed)
Reason for Disposition  [1] MODERATE pain (e.g., interferes with normal activities, limping) AND [2] present > 3 days  Answer Assessment - Initial Assessment Questions 1. ONSET: "When did the pain start?"      Both feet are swollen and very dark.   Son is saying he is hurting.   They say the blood is not circulating.   He can't walk. 2. LOCATION: "Where is the pain located?"      Both feet 3. PAIN: "How bad is the pain?"    (Scale 1-10; or mild, moderate, severe)  - MILD (1-3): doesn't interfere with normal activities.   - MODERATE (4-7): interferes with normal activities (e.g., work or school) or awakens from sleep, limping.   - SEVERE (8-10): excruciating pain, unable to do any normal activities, unable to walk.      Dark up to knees.   Blood goes down but not up. 4. WORK OR EXERCISE: "Has there been any recent work or exercise that involved this part of the body?"      No 5. CAUSE: "What do you think is causing the foot pain?"     Bad circulation 6. OTHER SYMPTOMS: "Do you have any other symptoms?" (e.g., leg pain, rash, fever, numbness)     Pain 7. PREGNANCY: "Is there any chance you are pregnant?" "When was your last menstrual period?"     N/A  Protocols used: Foot Pain-A-AH

## 2021-12-07 ENCOUNTER — Other Ambulatory Visit: Payer: Self-pay

## 2021-12-07 ENCOUNTER — Ambulatory Visit: Payer: Self-pay | Admitting: Physician Assistant

## 2021-12-07 ENCOUNTER — Encounter: Payer: Self-pay | Admitting: Physician Assistant

## 2021-12-07 VITALS — BP 161/77 | HR 75 | Resp 12 | Wt 143.0 lb

## 2021-12-07 DIAGNOSIS — D649 Anemia, unspecified: Secondary | ICD-10-CM

## 2021-12-07 DIAGNOSIS — I10 Essential (primary) hypertension: Secondary | ICD-10-CM

## 2021-12-07 DIAGNOSIS — Z8739 Personal history of other diseases of the musculoskeletal system and connective tissue: Secondary | ICD-10-CM

## 2021-12-07 DIAGNOSIS — Z13220 Encounter for screening for lipoid disorders: Secondary | ICD-10-CM

## 2021-12-07 DIAGNOSIS — L308 Other specified dermatitis: Secondary | ICD-10-CM

## 2021-12-07 DIAGNOSIS — I872 Venous insufficiency (chronic) (peripheral): Secondary | ICD-10-CM

## 2021-12-07 DIAGNOSIS — R609 Edema, unspecified: Secondary | ICD-10-CM | POA: Insufficient documentation

## 2021-12-07 DIAGNOSIS — E875 Hyperkalemia: Secondary | ICD-10-CM | POA: Insufficient documentation

## 2021-12-07 DIAGNOSIS — E039 Hypothyroidism, unspecified: Secondary | ICD-10-CM

## 2021-12-07 DIAGNOSIS — L309 Dermatitis, unspecified: Secondary | ICD-10-CM | POA: Insufficient documentation

## 2021-12-07 MED ORDER — ASPIRIN 81 MG PO TBEC: 81.0000 mg | DELAYED_RELEASE_TABLET | Freq: Every day | ORAL | 12 refills | Status: DC

## 2021-12-07 MED ORDER — LEVOTHYROXINE SODIUM 50 MCG PO TABS
50.0000 ug | ORAL_TABLET | Freq: Every day | ORAL | 1 refills | Status: DC
  Filled 2021-12-07: qty 30, 30d supply, fill #0
  Filled 2022-01-05: qty 30, 30d supply, fill #1

## 2021-12-07 MED ORDER — ALLOPURINOL 100 MG PO TABS
100.0000 mg | ORAL_TABLET | Freq: Every day | ORAL | 1 refills | Status: DC
  Filled 2021-12-07: qty 30, 30d supply, fill #0
  Filled 2022-01-05: qty 30, 30d supply, fill #1

## 2021-12-07 MED ORDER — TRIAMCINOLONE ACETONIDE 0.1 % EX CREA
1.0000 "application " | TOPICAL_CREAM | Freq: Two times a day (BID) | CUTANEOUS | 0 refills | Status: DC
  Filled 2021-12-07: qty 30, 15d supply, fill #0

## 2021-12-07 MED ORDER — DOXAZOSIN MESYLATE 4 MG PO TABS
4.0000 mg | ORAL_TABLET | Freq: Every day | ORAL | 1 refills | Status: DC
  Filled 2021-12-07: qty 30, 30d supply, fill #0
  Filled 2022-01-05: qty 30, 30d supply, fill #1

## 2021-12-07 MED ORDER — FUROSEMIDE 20 MG PO TABS
20.0000 mg | ORAL_TABLET | Freq: Every day | ORAL | 1 refills | Status: DC
  Filled 2021-12-07: qty 30, 30d supply, fill #0
  Filled 2022-01-05: qty 30, 30d supply, fill #1

## 2021-12-07 MED ORDER — BISOPROLOL FUMARATE 5 MG PO TABS
5.0000 mg | ORAL_TABLET | Freq: Every day | ORAL | 2 refills | Status: DC
  Filled 2021-12-07: qty 30, 30d supply, fill #0
  Filled 2022-01-05: qty 30, 30d supply, fill #1
  Filled 2022-02-04: qty 30, 30d supply, fill #2

## 2021-12-07 NOTE — Progress Notes (Signed)
New Patient Office Visit  Subjective    Patient ID: [name] [name], male    DOB: 1933/11/24  Age: 88 y.o. MRN: [id number]  CC:  Chief Complaint  Patient presents with   Hospitalization Follow-up    HPI [name] [name] presents with his son who acts as interpreter, they both declined third-party interpretation.  Reports that he was seen in the emergency department on December 04, 2021.  Emergency department course:    [name] [name] is a 88 y.o. male.   88 year old male with multiple medical problems. Been in [place] for last few years so unsure of medical care there. Has had multiple falls and fell today as well. Has swelling to both feet with some venous stasis as well. Also difficulty urinating. Also feels unwell often. Reportedly has all of his medications and takes them as prescribed.    Medical Decision Making Amount and/or Complexity of Data Reviewed Labs: ordered.   Risk Prescription drug management.   Mild AKI with hyperkalemia. Will give some fluids with lokelma and recheck. Needs PCP follow up, will engage TOC to ensure he gets timely follow up. K improved. Will continue hydration. Stable for d/c.   States today that he recently moved back to the [place] from [place].  States that he does require medication refills.  States that he has been out of his furosemide and atorvastatin.  States that he has been taking his other medications.  Does have photos of all medication boxes except furosemide and atorvastatin  Does endorse a history of congestive heart failure and vascular insufficiency.  States that he has been having bilateral lower edema as well as darkening skin on his ankles.  States that he was starting to be treated for this in [place] before he had to leave urgently due to unrest.  States that he has not been checking his blood pressure at home.  Request appointment to help him with his eczema.  States that he was using a prescription cream in the past with relief.  States that he  has it on both elbow flexors as well as occasionally behind his knees.    Outpatient Encounter Medications as of 12/07/2021  Medication Sig   aspirin EC 81 MG tablet Take 1 tablet (81 mg total) by mouth daily. Swallow whole.   atorvastatin (LIPITOR) 20 MG tablet Take 20 mg by mouth daily.   bisoprolol (ZEBETA) 5 MG tablet Take 1 tablet (5 mg total) by mouth once daily.   NON FORMULARY Heptaminol drops   triamcinolone cream (KENALOG) 0.1 % Apply 1 application topically to the affected area(s) 2 (two) times daily.   [DISCONTINUED] allopurinol (ZYLOPRIM) 100 MG tablet Take 1 tablet (100 mg total) by mouth daily.   [DISCONTINUED] aspirin 81 MG tablet Take 1 tablet (81 mg total) by mouth daily.   [DISCONTINUED] BISOPROLOL FUMARATE PO Take 5 mg by mouth.   [DISCONTINUED] doxazosin (CARDURA) 4 MG tablet Take 1 tablet (4 mg total) by mouth at bedtime.   [DISCONTINUED] furosemide (LASIX) 40 MG tablet Take 40 mg by mouth.   [DISCONTINUED] hydrochlorothiazide (HYDRODIURIL) 25 MG tablet Take 1 tablet (25 mg total) by mouth daily.   [DISCONTINUED] levothyroxine (SYNTHROID, LEVOTHROID) 50 MCG tablet Take 1 tablet (50 mcg total) by mouth daily before breakfast.   [DISCONTINUED] lisinopril (PRINIVIL,ZESTRIL) 5 MG tablet Take 1 tablet (5 mg total) by mouth daily.   allopurinol (ZYLOPRIM) 100 MG tablet Take 1 tablet (100 mg total) by mouth once daily.   doxazosin (CARDURA) 4  MG tablet Take 1 tablet (4 mg total) by mouth once nightly at bedtime.   furosemide (LASIX) 20 MG tablet Take 1 tablet (20 mg total) by mouth once daily.   levothyroxine (SYNTHROID) 50 MCG tablet Take 1 tablet (50 mcg total) by mouth once daily before breakfast.   No facility-administered encounter medications on file as of 12/07/2021.    Past Medical History:  Diagnosis Date   Back pain 10/2008   per MRI 11/06/2008 - severe spinal stenosis worse at L4-5, L3-4, with neural compression at those levels   BPH (benign prostatic  hyperplasia)    Eczema    Gout    Hypertension    Hypothyroidism     Past Surgical History:  Procedure Laterality Date   LAMINECTOMY  12/11/2008    Bilateral L2, L3, and L4 laminotomies, done by Dr. Arnoldo Morale    Family History  Problem Relation Age of Onset   Stroke Neg Hx    Cancer Neg Hx     Social History   Socioeconomic History   Marital status: Married    Spouse name: Not on file   Number of children: Not on file   Years of education: Not on file   Highest education level: Not on file  Occupational History   Not on file  Tobacco Use   Smoking status: Never   Smokeless tobacco: Never  Substance and Sexual Activity   Alcohol use: No   Drug use: No   Sexual activity: Not on file  Other Topics Concern   Not on file  Social History Narrative   Not on file   Social Determinants of Health   Financial Resource Strain: Not on file  Food Insecurity: Not on file  Transportation Needs: Not on file  Physical Activity: Not on file  Stress: Not on file  Social Connections: Not on file  Intimate Partner Violence: Not on file    Review of Systems  Constitutional: Negative.   HENT: Negative.    Eyes: Negative.   Respiratory:  Negative for cough and shortness of breath.   Cardiovascular:  Positive for leg swelling. Negative for chest pain.  Gastrointestinal: Negative.   Genitourinary: Negative.   Musculoskeletal: Negative.   Skin:  Positive for rash.  Neurological:  Negative for dizziness and weakness.  Endo/Heme/Allergies: Negative.   Psychiatric/Behavioral: Negative.          Objective    BP (!) 161/77 (BP Location: Left Arm, Patient Position: Sitting, Cuff Size: Normal)   Pulse 75   Resp 12   Wt 143 lb (64.9 kg)   SpO2 100%   BMI 19.39 kg/m   Physical Exam Vitals and nursing note reviewed.  Constitutional:      Appearance: Normal appearance.  HENT:     Head: Normocephalic and atraumatic.     Right Ear: External ear normal.     Left Ear:  External ear normal.     Nose: Nose normal.     Mouth/Throat:     Mouth: Mucous membranes are moist.     Pharynx: Oropharynx is clear.  Eyes:     Extraocular Movements: Extraocular movements intact.     Conjunctiva/sclera: Conjunctivae normal.     Pupils: Pupils are equal, round, and reactive to light.  Cardiovascular:     Rate and Rhythm: Normal rate and regular rhythm.     Pulses: Normal pulses.          Dorsalis pedis pulses are 2+ on the right side and 2+ on  the left side.       Posterior tibial pulses are 2+ on the right side and 2+ on the left side.     Heart sounds: Normal heart sounds.  Pulmonary:     Effort: Pulmonary effort is normal.     Breath sounds: Normal breath sounds.  Musculoskeletal:        General: Normal range of motion.     Cervical back: Normal range of motion and neck supple.     Right lower leg: 1+ Pitting Edema present.     Left lower leg: 1+ Pitting Edema present.  Skin:    General: Skin is warm and dry.     Findings: Rash present. Rash is scaling.     Comments: On both elbow flexors  Dry flaky darkened skin on both lower legs /ankles  Neurological:     General: No focal deficit present.     Mental Status: He is alert and oriented to person, place, and time.  Psychiatric:        Mood and Affect: Mood normal.        Behavior: Behavior normal.        Thought Content: Thought content normal.        Judgment: Judgment normal.        Assessment & Plan:   Problem List Items Addressed This Visit       Cardiovascular and Mediastinum   Essential hypertension   Relevant Medications   doxazosin (CARDURA) 4 MG tablet   bisoprolol (ZEBETA) 5 MG tablet   aspirin EC 81 MG tablet   furosemide (LASIX) 20 MG tablet   Venous insufficiency of both lower extremities   Relevant Medications   doxazosin (CARDURA) 4 MG tablet   bisoprolol (ZEBETA) 5 MG tablet   aspirin EC 81 MG tablet   furosemide (LASIX) 20 MG tablet   Other Relevant Orders   Ambulatory  referral to Vascular Surgery     Endocrine   Hypothyroidism - Primary   Relevant Medications   bisoprolol (ZEBETA) 5 MG tablet   levothyroxine (SYNTHROID) 50 MCG tablet     Musculoskeletal and Integument   Eczema   Relevant Medications   triamcinolone cream (KENALOG) 0.1 %     Other   ANEMIA, NORMOCYTIC   Relevant Orders   Iron, TIBC and Ferritin Panel   Hx of gout   Relevant Medications   allopurinol (ZYLOPRIM) 100 MG tablet   Other Relevant Orders   Uric Acid   Hyperkalemia   Edema   Relevant Medications   furosemide (LASIX) 20 MG tablet   Other Visit Diagnoses     Screening, lipid       Relevant Orders   Lipid panel     1. Essential hypertension Continue current regimen.  Was able to verify current dosing with photos of prescription boxes.  Patient encouraged to check blood pressure at home on a daily basis, keep a written log and have available for all office visits.  Red flags given for prompt reevaluation.  Patient has an appointment to reestablish care with Dr. Wynetta Emery on January 07, 2022.  Fasting labs completed today  - doxazosin (CARDURA) 4 MG tablet; Take 1 tablet (4 mg total) by mouth once nightly at bedtime.  Dispense: 30 tablet; Refill: 1 - bisoprolol (ZEBETA) 5 MG tablet; Take 1 tablet (5 mg total) by mouth once daily.  Dispense: 30 tablet; Refill: 2 - aspirin EC 81 MG tablet; Take 1 tablet (81 mg total) by mouth daily.  Swallow whole.  Dispense: 30 tablet; Refill: 12  2. Hypothyroidism, unspecified type Continue current regimen - levothyroxine (SYNTHROID) 50 MCG tablet; Take 1 tablet (50 mcg total) by mouth once daily before breakfast.  Dispense: 30 tablet; Refill: 1  3. Hyperkalemia Currently resolved  4. Edema, unspecified type Patient did not have prescription box and last prescription was written in 2018 [facility].  Start patient at 20 mg patient education given on supportive care, red flags for prompt reevaluation - furosemide (LASIX) 20 MG  tablet; Take 1 tablet (20 mg total) by mouth once daily.  Dispense: 30 tablet; Refill: 1  5. Venous insufficiency of both lower extremities  - Ambulatory referral to Vascular Surgery  6. Anemia, unspecified type Patient is currently taking an iron, folic acid, and vitamin B12 prescription supplement from [place]. - Iron, TIBC and Ferritin Panel  7. Other eczema Trial Kenalog - triamcinolone cream (KENALOG) 0.1 %; Apply 1 application topically to the affected area(s) 2 (two) times daily.  Dispense: 30 g; Refill: 0  8. Hx of gout Continue current regimen - Uric Acid - allopurinol (ZYLOPRIM) 100 MG tablet; Take 1 tablet (100 mg total) by mouth once daily.  Dispense: 30 tablet; Refill: 1  9. Screening, lipid  - Lipid panel    I have reviewed the patient's medical history (PMH, PSH, Social History, Family History, Medications, and allergies) , and have been updated if relevant. I spent 50 minutes reviewing chart and  face to face time with patient.    Return in about 31 days (around 01/07/2022) for [facility].   Loraine Grip Mayers, PA-C

## 2021-12-07 NOTE — Progress Notes (Signed)
Bilateral lower edema and discoloration   Dizziness and fatigue   RF on atorvastatin Low BP readings

## 2021-12-07 NOTE — Patient Instructions (Signed)
You are going to restart taking the Lasix 20 mg once daily in the morning.  I refilled the rest of your medications, with the exception of the iron, we will check that today to determine if you need to be taking iron on a daily basis.  I do encourage you to stay hydrated, you should be drinking approximately 64 ounces of water a day, walking is much as you are able.  I did start a referral for you to be seen by vein and vascular specialist.  Your blood pressure is elevated today, I do encourage you to check your blood pressure at home, keep a written log and have it available for your office visit when you see Dr. Wynetta Emery on July 14.  We will call you with today's lab results.  Kennieth Rad, PA-C Physician Assistant [facility] [email or web address]   Chronic Venous Insufficiency Chronic venous insufficiency is a condition where the leg veins cannot effectively pump blood from the legs to the heart. This happens when the vein walls are either stretched, weakened, or damaged, or when the valves inside the vein are damaged. With the right treatment, you should be able to continue with an active life. This condition is also called venous stasis. What are the causes? Common causes of this condition include: High blood pressure inside the veins (venous hypertension). Sitting or standing too long, causing increased blood pressure in the leg veins. A blood clot that blocks blood flow in a vein (deep vein thrombosis, DVT). Inflammation of a vein (phlebitis) that causes a blood clot to form. Tumors in the pelvis that cause blood to back up. What increases the risk? The following factors may make you more likely to develop this condition: Having a family history of this condition. Obesity. Pregnancy. Living without enough regular physical activity or exercise (sedentary lifestyle). Smoking. Having a job that requires long periods of standing  or sitting in one place. Being a certain age. Women in their 6s and 58s and men in their 61s are more likely to develop this condition. What are the signs or symptoms? Symptoms of this condition include: Veins that are enlarged, bulging, or twisted (varicose veins). Skin breakdown or ulcers. Reddened skin or dark discoloration of skin on the leg between the knee and ankle. Brown, smooth, tight, and painful skin just above the ankle, usually on the inside of the leg (lipodermatosclerosis). Swelling of the legs. How is this diagnosed? This condition may be diagnosed based on: Your medical history. A physical exam. Tests, such as: A procedure that creates an image of a blood vessel and nearby organs and provides information about blood flow through the blood vessel (duplex ultrasound). A procedure that tests blood flow (plethysmography). A procedure that looks at the veins using X-ray and dye (venogram). How is this treated? The goals of treatment are to help you return to an active life and to minimize pain or disability. Treatment depends on the severity of your condition, and it may include: Wearing compression stockings. These can help relieve symptoms and help prevent your condition from getting worse. However, they do not cure the condition. Sclerotherapy. This procedure involves an injection of a solution that shrinks damaged veins. Surgery. This may involve: Removing a diseased vein (vein stripping). Cutting off blood flow through the vein (laser ablation surgery). Repairing or reconstructing a valve within the affected vein. Follow these instructions at home:     Wear compression stockings as told by your health care provider. These  stockings help to prevent blood clots and reduce swelling in your legs. Take over-the-counter and prescription medicines only as told by your health care provider. Stay active by exercising, walking, or doing different activities. Ask your health  care provider what activities are safe for you and how much exercise you need. Drink enough fluid to keep your urine pale yellow. Do not use any products that contain nicotine or tobacco, such as cigarettes, e-cigarettes, and chewing tobacco. If you need help quitting, ask your health care provider. Keep all follow-up visits as told by your health care provider. This is important. Contact a health care provider if you: Have redness, swelling, or more pain in the affected area. See a red streak or line that goes up or down from the affected area. Have skin breakdown or skin loss in the affected area, even if the breakdown is small. Get an injury in the affected area. Get help right away if: You get an injury and an open wound in the affected area. You have: Severe pain that does not get better with medicine. Sudden numbness or weakness in the foot or ankle below the affected area. Trouble moving your foot or ankle. A fever. Worse or persistent symptoms. Chest pain. Shortness of breath. Summary Chronic venous insufficiency is a condition where the leg veins cannot effectively pump blood from the legs to the heart. Chronic venous insufficiency occurs when the vein walls become stretched, weakened, or damaged, or when valves within the vein are damaged. Treatment depends on how severe your condition is. It often involves wearing compression stockings and may involve having a procedure. Make sure you stay active by exercising, walking, or doing different activities. Ask your health care provider what activities are safe for you and how much exercise you need. This information is not intended to replace advice given to you by your health care provider. Make sure you discuss any questions you have with your health care provider. Document Revised: 08/25/2020 Document Reviewed: 08/25/2020 Elsevier Patient Education  [employer].

## 2021-12-08 LAB — URIC ACID: Uric Acid: 6.9 mg/dL (ref 3.8–8.4)

## 2021-12-08 LAB — IRON,TIBC AND FERRITIN PANEL
Ferritin: 294 ng/mL (ref 30–400)
Iron Saturation: 18 % (ref 15–55)
Iron: 33 ug/dL — ABNORMAL LOW (ref 38–169)
Total Iron Binding Capacity: 184 ug/dL — ABNORMAL LOW (ref 250–450)
UIBC: 151 ug/dL (ref 111–343)

## 2021-12-08 LAB — LIPID PANEL
Chol/HDL Ratio: 3.5 ratio (ref 0.0–5.0)
Cholesterol, Total: 166 mg/dL (ref 100–199)
HDL: 48 mg/dL (ref >39)
LDL Chol Calc (NIH): 104 mg/dL — ABNORMAL HIGH (ref 0–99)
Triglycerides: 76 mg/dL (ref 0–149)
VLDL Cholesterol Cal: 14 mg/dL (ref 5–40)

## 2021-12-09 ENCOUNTER — Other Ambulatory Visit: Payer: Self-pay

## 2021-12-09 MED ORDER — IRON (FERROUS SULFATE) 325 (65 FE) MG PO TABS
325.0000 mg | ORAL_TABLET | ORAL | 11 refills | Status: DC
  Filled 2021-12-09: qty 15, 30d supply, fill #0
  Filled 2022-01-05: qty 15, 30d supply, fill #1
  Filled 2022-02-04: qty 15, 30d supply, fill #2
  Filled 2022-03-07: qty 15, 30d supply, fill #3
  Filled 2022-04-08: qty 15, 30d supply, fill #4
  Filled 2022-05-06: qty 15, 30d supply, fill #5
  Filled 2022-06-08: qty 15, 30d supply, fill #6
  Filled 2022-08-22 (×2): qty 15, 30d supply, fill #7
  Filled 2022-09-23: qty 15, 30d supply, fill #8

## 2021-12-09 NOTE — Addendum Note (Signed)
Addended by: Roney Jaffe on: 12/09/2021 05:03 PM   Modules accepted: Orders

## 2021-12-10 ENCOUNTER — Other Ambulatory Visit: Payer: Self-pay

## 2021-12-10 ENCOUNTER — Emergency Department (HOSPITAL_COMMUNITY): Payer: Self-pay

## 2021-12-10 ENCOUNTER — Ambulatory Visit (HOSPITAL_COMMUNITY)
Admission: EM | Admit: 2021-12-10 | Discharge: 2021-12-10 | Disposition: A | Discharge status: Home / Self Care | Payer: Self-pay

## 2021-12-10 ENCOUNTER — Encounter (HOSPITAL_COMMUNITY): Payer: Self-pay | Admitting: Emergency Medicine

## 2021-12-10 ENCOUNTER — Emergency Department (HOSPITAL_COMMUNITY)
Admission: EM | Admit: 2021-12-10 | Discharge: 2021-12-10 | Disposition: A | Discharge status: Home / Self Care | Payer: Self-pay | Attending: Emergency Medicine | Admitting: Emergency Medicine

## 2021-12-10 ENCOUNTER — Encounter (HOSPITAL_COMMUNITY): Payer: Self-pay

## 2021-12-10 DIAGNOSIS — D649 Anemia, unspecified: Secondary | ICD-10-CM | POA: Insufficient documentation

## 2021-12-10 DIAGNOSIS — E039 Hypothyroidism, unspecified: Secondary | ICD-10-CM | POA: Insufficient documentation

## 2021-12-10 DIAGNOSIS — Z79899 Other long term (current) drug therapy: Secondary | ICD-10-CM | POA: Insufficient documentation

## 2021-12-10 DIAGNOSIS — R0602 Shortness of breath: Secondary | ICD-10-CM | POA: Insufficient documentation

## 2021-12-10 DIAGNOSIS — Z7982 Long term (current) use of aspirin: Secondary | ICD-10-CM | POA: Insufficient documentation

## 2021-12-10 DIAGNOSIS — I10 Essential (primary) hypertension: Secondary | ICD-10-CM | POA: Insufficient documentation

## 2021-12-10 LAB — CBC WITH DIFFERENTIAL/PLATELET
Abs Immature Granulocytes: 0.02 10*3/uL (ref 0.00–0.07)
Basophils Absolute: 0.1 10*3/uL (ref 0.0–0.1)
Basophils Relative: 1 %
Eosinophils Absolute: 0.6 10*3/uL — ABNORMAL HIGH (ref 0.0–0.5)
Eosinophils Relative: 10 %
HCT: 31.7 % — ABNORMAL LOW (ref 39.0–52.0)
Hemoglobin: 10 g/dL — ABNORMAL LOW (ref 13.0–17.0)
Immature Granulocytes: 0 %
Lymphocytes Relative: 43 %
Lymphs Abs: 2.4 10*3/uL (ref 0.7–4.0)
MCH: 28.8 pg (ref 26.0–34.0)
MCHC: 31.5 g/dL (ref 30.0–36.0)
MCV: 91.4 fL (ref 80.0–100.0)
Monocytes Absolute: 0.6 10*3/uL (ref 0.1–1.0)
Monocytes Relative: 10 %
Neutro Abs: 2.1 10*3/uL (ref 1.7–7.7)
Neutrophils Relative %: 36 %
Platelets: 310 10*3/uL (ref 150–400)
RBC: 3.47 MIL/uL — ABNORMAL LOW (ref 4.22–5.81)
RDW: 17.2 % — ABNORMAL HIGH (ref 11.5–15.5)
WBC: 5.7 10*3/uL (ref 4.0–10.5)
nRBC: 0 % (ref 0.0–0.2)

## 2021-12-10 LAB — COMPREHENSIVE METABOLIC PANEL
ALT: 14 U/L (ref 0–44)
AST: 20 U/L (ref 15–41)
Albumin: 3.3 g/dL — ABNORMAL LOW (ref 3.5–5.0)
Alkaline Phosphatase: 49 U/L (ref 38–126)
Anion gap: 6 (ref 5–15)
BUN: 33 mg/dL — ABNORMAL HIGH (ref 8–23)
CO2: 27 mmol/L (ref 22–32)
Calcium: 9.5 mg/dL (ref 8.9–10.3)
Chloride: 104 mmol/L (ref 98–111)
Creatinine, Ser: 1.45 mg/dL — ABNORMAL HIGH (ref 0.61–1.24)
GFR, Estimated: 47 mL/min — ABNORMAL LOW (ref >60)
Glucose, Bld: 94 mg/dL (ref 70–99)
Potassium: 5.2 mmol/L — ABNORMAL HIGH (ref 3.5–5.1)
Sodium: 137 mmol/L (ref 135–145)
Total Bilirubin: 0.7 mg/dL (ref 0.3–1.2)
Total Protein: 7.1 g/dL (ref 6.5–8.1)

## 2021-12-10 LAB — BRAIN NATRIURETIC PEPTIDE: B Natriuretic Peptide: 61.6 pg/mL (ref 0.0–100.0)

## 2021-12-10 LAB — TROPONIN I (HIGH SENSITIVITY)
Troponin I (High Sensitivity): 29 ng/L — ABNORMAL HIGH (ref <18)
Troponin I (High Sensitivity): 33 ng/L — ABNORMAL HIGH (ref <18)

## 2021-12-10 LAB — D-DIMER, QUANTITATIVE: D-Dimer, Quant: 1.44 ug/mL-FEU — ABNORMAL HIGH (ref 0.00–0.50)

## 2021-12-10 MED ORDER — IOHEXOL 350 MG/ML SOLN
80.0000 mL | Freq: Once | INTRAVENOUS | Status: AC | PRN
  Administered 2021-12-10: 80 mL via INTRAVENOUS

## 2021-12-10 MED ORDER — ALBUTEROL SULFATE HFA 108 (90 BASE) MCG/ACT IN AERS
2.0000 | INHALATION_SPRAY | Freq: Four times a day (QID) | RESPIRATORY_TRACT | 2 refills | Status: DC | PRN
  Filled 2021-12-10: qty 8, fill #0

## 2021-12-10 MED ORDER — ALBUTEROL SULFATE (2.5 MG/3ML) 0.083% IN NEBU
2.5000 mg | INHALATION_SOLUTION | RESPIRATORY_TRACT | 2 refills | Status: DC | PRN
  Filled 2021-12-10: qty 75, 5d supply, fill #0

## 2021-12-10 MED ORDER — ALBUTEROL SULFATE HFA 108 (90 BASE) MCG/ACT IN AERS: 2.0000 | INHALATION_SPRAY | RESPIRATORY_TRACT | Status: DC | PRN

## 2021-12-10 NOTE — ED Provider Notes (Signed)
I was called into triage by nursing staff to evaluate 88 year old with shortness of breath.  Patient is accompanied by his daughter who provides majority of history.  Reports a 4-hour history of sudden shortness of breath.  Denies additional symptoms including chest pain, palpitation, nausea, vomiting, lightheadedness.  He has been taking Lasix but is not doing daily weights.  During triage he was noted to be mildly hypotensive and discussed that we do not have capabilities of CT or stat lab work and so given sudden onset of significant symptoms he should go to the emergency room.  Family was ultimately agreeable and will take him to [facility], ER for further evaluation and management.  Patient was stable the time of discharge.   Jeani Hawking, PA-C 12/10/21 1558

## 2021-12-10 NOTE — ED Provider Triage Note (Signed)
Emergency Medicine Provider Triage Evaluation Note  [name] [name] , a 88 y.o. male  was evaluated in triage.  Pt complains of shortness of breath.  Patient accompanied by his daughter who provides majority of history.  States that approximately 6 hours ago he had acute onset shortness of breath.  History of CHF and takes Lasix every day.  Reports compliance with this regimen and no significant weight gain.  Patient is not anticoagulated and denies any leg pain.  However daughter does state that he just arrived here from [place] 1 week ago.  Denies any chest pain, palpitations, nausea, vomiting, fevers, or chills  Review of Systems  Positive: Negative:   Physical Exam  BP (!) 120/58 (BP Location: Left Arm)   Pulse 96   Temp 97.6 F (36.4 C) (Oral)   Resp 14   Ht 6' (1.829 m)   Wt 64.9 kg   SpO2 100%   BMI 19.39 kg/m  Gen:   Awake, no distress   Resp:  Normal effort, lungs CTA MSK:   Moves extremities without difficulty  Other:    Medical Decision Making  Medically screening exam initiated at 5:15 PM.  Appropriate orders placed.  [name] [name] was informed that the remainder of the evaluation will be completed by another provider, this initial triage assessment does not replace that evaluation, and the importance of remaining in the ED until their evaluation is complete.     Silva Bandy, PA-C 12/10/21 1719

## 2021-12-10 NOTE — Discharge Instructions (Addendum)
Your blood work overall was unchanged from the last time you are in the [facility] emergency room a week ago.  Your chest x-ray was also the same as last time you are in the [facility] emergency room.  The one lab that was different was an elevated D-dimer.  The D-dimer is an inflammatory marker that can indicate blood clots, although is not a very specific tool to use.  Your D-dimer value was 1.44, whereas the normal is 0-0.5.  Because of this, we obtained a CT scan of your chest to make sure there were no blood clots in your lungs.  The good news is that the CT scan came back negative, so we did not find any blood clots in your lungs.  Use albuterol as needed. I have sent in a script for both a nebulizer solution and an inhaler 3 days.  We believe you are safe to go home.  Please follow-up with your primary care doctor.  As always, if you develop worsening symptoms or have acute concerns, please come back for further evaluation and care.

## 2021-12-10 NOTE — ED Provider Notes (Signed)
[facility]-EMERGENCY DEPT Provider Note   CSN: [id number] Arrival date & time: 12/10/21  1624  History Chief Complaint  Patient presents with   Shortness of Breath   [name] [name] is a 88 y.o. male.  88 year old male presents from home with son for shortness of breath, which started this afternoon.  He originally presented to urgent care but were directed towards the ED.  PMH includes HTN, hypothyroidism, anemia, BPH, chronic venous insufficiency of BLE.  Patient is Arabic speaking only, son at bedside translates.  Both declined Nurse, learning disability.  Son reports that patient started complaining of abnormal breathing earlier this afternoon, but could not pinpoint any details further than that.  Son relates that he did not observe any wheezing, increased work of breathing, or acute distress in the patient.  The son says that patient prefers his room to be quite warm with no air movement, such as no windows open and no fan on.  Denies any known sick contacts.  No recent sick symptoms such as fever, chills, nausea, vomiting, rhinorrhea, sore throat, ear pain, diarrhea, constipation.   Home Medications Prior to Admission medications   Medication Sig Start Date End Date Taking? Authorizing Provider  allopurinol (ZYLOPRIM) 100 MG tablet Take 1 tablet (100 mg total) by mouth once daily. 12/07/21  Yes Mayers, Cari S, PA-C  aspirin EC 81 MG tablet Take 1 tablet (81 mg total) by mouth daily. Swallow whole. 12/07/21  Yes Mayers, Cari S, PA-C  doxazosin (CARDURA) 4 MG tablet Take 1 tablet (4 mg total) by mouth once nightly at bedtime. 12/07/21  Yes Mayers, Cari S, PA-C  furosemide (LASIX) 20 MG tablet Take 1 tablet (20 mg total) by mouth once daily. 12/07/21  Yes Mayers, Cari S, PA-C  levothyroxine (SYNTHROID) 50 MCG tablet Take 1 tablet (50 mcg total) by mouth once daily before breakfast. 12/07/21  Yes Mayers, Cari S, PA-C  bisoprolol (ZEBETA) 5 MG tablet Take 1 tablet (5 mg total) by mouth once  daily. 12/07/21   Mayers, Cari S, PA-C  Iron, Ferrous Sulfate, 325 (65 Fe) MG TABS Take 1 tablet (325 mg) by mouth every other day. 12/09/21   Mayers, Cari S, PA-C  triamcinolone cream (KENALOG) 0.1 % Apply 1 application topically to the affected area(s) 2 (two) times daily. 12/07/21   Mayers, Cari S, PA-C     Allergies    Patient has no known allergies.    Review of Systems   Review of Systems  Constitutional:  Negative for activity change, appetite change, chills, fatigue and fever.  HENT:  Negative for congestion, ear pain, postnasal drip, rhinorrhea, sinus pressure, sinus pain and sore throat.   Eyes:  Negative for pain and itching.  Respiratory:  Positive for shortness of breath. Negative for cough, choking, chest tightness, wheezing and stridor.   Cardiovascular:  Negative for chest pain.  Gastrointestinal:  Negative for abdominal pain, blood in stool, constipation, diarrhea, nausea and vomiting.  Musculoskeletal:  Negative for arthralgias and back pain.  Neurological:  Negative for dizziness, syncope, weakness, light-headedness, numbness and headaches.    Physical Exam Updated Vital Signs BP (!) 156/85 (BP Location: Right Arm)   Pulse 99   Temp 97.6 F (36.4 C) (Oral)   Resp 17   Ht 6' (1.829 m)   Wt 64.9 kg   SpO2 100%   BMI 19.39 kg/m  Physical Exam Constitutional:      General: He is not in acute distress.    Appearance: He is well-developed.  He is not ill-appearing, toxic-appearing or diaphoretic.  HENT:     Head: Normocephalic and atraumatic.     Mouth/Throat:     Mouth: Mucous membranes are moist.  Eyes:     Extraocular Movements: Extraocular movements intact.     Pupils: Pupils are equal, round, and reactive to light.  Cardiovascular:     Rate and Rhythm: Normal rate and regular rhythm.  Pulmonary:     Effort: Pulmonary effort is normal.     Breath sounds: Normal breath sounds. No wheezing.  Chest:     Chest wall: No tenderness.  Abdominal:     General:  Bowel sounds are normal.  Skin:    General: Skin is warm.     Capillary Refill: Capillary refill takes less than 2 seconds.  Neurological:     General: No focal deficit present.     Mental Status: He is alert.     ED Results / Procedures / Treatments   Labs (all labs ordered are listed, but only abnormal results are displayed) Labs Reviewed  CBC WITH DIFFERENTIAL/PLATELET - Abnormal; Notable for the following components:      Result Value   RBC 3.47 (*)    Hemoglobin 10.0 (*)    HCT 31.7 (*)    RDW 17.2 (*)    Eosinophils Absolute 0.6 (*)    All other components within normal limits  COMPREHENSIVE METABOLIC PANEL - Abnormal; Notable for the following components:   Potassium 5.2 (*)    BUN 33 (*)    Creatinine, Ser 1.45 (*)    Albumin 3.3 (*)    GFR, Estimated 47 (*)    All other components within normal limits  D-DIMER, QUANTITATIVE - Abnormal; Notable for the following components:   D-Dimer, Quant 1.44 (*)    All other components within normal limits  TROPONIN I (HIGH SENSITIVITY) - Abnormal; Notable for the following components:   Troponin I (High Sensitivity) 29 (*)    All other components within normal limits  TROPONIN I (HIGH SENSITIVITY) - Abnormal; Notable for the following components:   Troponin I (High Sensitivity) 33 (*)    All other components within normal limits  BRAIN NATRIURETIC PEPTIDE   EKG EKG Interpretation  Date/Time:  Friday December 10 2021 16:41:26 EDT Ventricular Rate:  95 PR Interval:  173 QRS Duration: 74 QT Interval:  361 QTC Calculation: 454 R Axis:   77 Text Interpretation: Sinus rhythm Ventricular trigeminy No significant change since last tracing Confirmed by Richardean Canal [phone]) on 12/10/2021 9:56:44 PM  Radiology DG Chest 2 View  Result Date: 12/10/2021 CLINICAL DATA:  Shortness of breath EXAM: CHEST - 2 VIEW COMPARISON:  12/03/2021 FINDINGS: Unchanged left basilar atelectasis. Lungs are otherwise clear. Normal cardiomediastinal  contours. IMPRESSION: Unchanged left basilar atelectasis. Electronically Signed   By: Deatra Robinson M.D.   On: 12/10/2021 17:08    Procedures Procedures   Medications Ordered in ED Medications  albuterol (VENTOLIN HFA) 108 (90 Base) MCG/ACT inhaler 2 puff (has no administration in time range)  iohexol (OMNIPAQUE) 350 MG/ML injection 80 mL (80 mLs Intravenous Contrast Given 12/10/21 2251)   ED Course/ Medical Decision Making/ A&P                           Medical Decision Making 88 year old male presents with shortness of breath.  Vital signs stable on room air. CBC demonstrates stable anemia, hemoglobin 10.0.  CMP stable from previous 1 week ago  in [place], ED troponin low and stable at 29 and 33.  BMP normal.  D-dimer 1.44, necessitated CTA chest for PE rule out.  CTA chest negative for PE, did show bibasilar atelectasis and some emphysematous changes. Given stable vital signs on room air, discharged patient with albuterol inhaler and nebulizer solutions with close PCP follow-up.  Return precautions given, see AVS for more.   Amount and/or Complexity of Data Reviewed Independent Historian:     Details: Son at bedside. Labs: ordered. Radiology: ordered.  Risk Prescription drug management.  Final Clinical Impression(s) / ED Diagnoses Final diagnoses:  SOB (shortness of breath)   Rx / DC Orders ED Discharge Orders     None      Fayette Pho, MD   Fayette Pho, MD 12/10/21 2314    Charlynne Pander, MD 12/11/21 2251

## 2021-12-10 NOTE — ED Notes (Signed)
Patient is being discharged from the Urgent Care and sent to the Emergency Department via POV with family . Per Dorann Ou, PA, patient is in need of higher level of care due to hypotension and acute onset of shortness of breath. Patient is aware and verbalizes understanding of plan of care.  Vitals:   12/10/21 1546  BP: (!) 98/48  Pulse: 99  Resp: 18  Temp: 97.7 F (36.5 C)  SpO2: 100%

## 2021-12-10 NOTE — ED Triage Notes (Addendum)
Patient's daughter reports that the patient had SOB, a non productive cough, and hypotension that started approx 4 hours ago. Patient was sent from UC to the ED for further evaluation and decreased sats.  Sats in triage 100%. And BP in triage-107/54.

## 2021-12-10 NOTE — ED Triage Notes (Signed)
Pt having SOB that started 4 hours ago. Denies cough or congestion.

## 2021-12-10 NOTE — ED Notes (Signed)
Pt to CT scanner at this time 

## 2021-12-13 ENCOUNTER — Other Ambulatory Visit: Payer: Self-pay

## 2021-12-13 ENCOUNTER — Telehealth: Payer: Self-pay

## 2021-12-13 NOTE — Telephone Encounter (Signed)
Received phone call from pharmacy regarding prescriptions for albuterol solution and inhaler.   Albuterol solution quantity 75 mL is not available, pharmacy is requesting new prescription for 90 mL box.   Albuterol inhaler dispense quantity 8 g is not available. Recommending change to Ventolin 18 g or Pro Air 8.5 g  Forwarding to prescriber.   Veronda Prude, RN

## 2021-12-15 ENCOUNTER — Other Ambulatory Visit: Payer: Self-pay

## 2021-12-15 ENCOUNTER — Other Ambulatory Visit: Payer: Self-pay | Admitting: Family Medicine

## 2021-12-15 MED ORDER — ALBUTEROL SULFATE (2.5 MG/3ML) 0.083% IN NEBU
2.5000 mg | INHALATION_SOLUTION | RESPIRATORY_TRACT | 2 refills | Status: DC | PRN
  Filled 2021-12-15: qty 90, 5d supply, fill #0

## 2021-12-15 MED ORDER — ALBUTEROL SULFATE HFA 108 (90 BASE) MCG/ACT IN AERS
2.0000 | INHALATION_SPRAY | RESPIRATORY_TRACT | 0 refills | Status: DC | PRN
  Filled 2021-12-15: qty 18, 16d supply, fill #0

## 2021-12-15 NOTE — Telephone Encounter (Signed)
Orders placed.

## 2021-12-16 ENCOUNTER — Other Ambulatory Visit: Payer: Self-pay

## 2021-12-16 ENCOUNTER — Telehealth: Payer: Self-pay | Admitting: Internal Medicine

## 2021-12-16 NOTE — Telephone Encounter (Addendum)
A referral was placed on 12/07/2021 at the time he was seen on the mobile unit by [facility].  Awaiting for an appointment for OC/ financial counselor.

## 2021-12-16 NOTE — Telephone Encounter (Signed)
Pts son came in stating this father needs a referral for issues with circulation in his feet, a facility that accepts the orange card.   Son is requesting a call in regards to this best contact: [phone]

## 2021-12-21 ENCOUNTER — Encounter: Payer: Self-pay | Admitting: Physician Assistant

## 2021-12-21 ENCOUNTER — Ambulatory Visit (HOSPITAL_COMMUNITY)
Admission: RE | Admit: 2021-12-21 | Discharge: 2021-12-21 | Disposition: A | Discharge status: Home / Self Care | Payer: Self-pay | Source: Ambulatory Visit | Attending: Vascular Surgery | Admitting: Vascular Surgery

## 2021-12-21 ENCOUNTER — Ambulatory Visit (INDEPENDENT_AMBULATORY_CARE_PROVIDER_SITE_OTHER): Payer: Self-pay | Admitting: Physician Assistant

## 2021-12-21 ENCOUNTER — Other Ambulatory Visit: Payer: Self-pay | Admitting: *Deleted

## 2021-12-21 VITALS — BP 116/54 | HR 78 | Temp 97.6°F | Resp 18 | Ht 72.0 in | Wt 145.1 lb

## 2021-12-21 DIAGNOSIS — I872 Venous insufficiency (chronic) (peripheral): Secondary | ICD-10-CM | POA: Insufficient documentation

## 2021-12-21 DIAGNOSIS — M7989 Other specified soft tissue disorders: Secondary | ICD-10-CM

## 2022-01-05 ENCOUNTER — Other Ambulatory Visit: Payer: Self-pay

## 2022-01-07 ENCOUNTER — Encounter: Payer: Self-pay | Admitting: Internal Medicine

## 2022-01-07 ENCOUNTER — Other Ambulatory Visit: Payer: Self-pay

## 2022-01-07 ENCOUNTER — Ambulatory Visit: Payer: Self-pay | Attending: Internal Medicine | Admitting: Internal Medicine

## 2022-01-07 VITALS — BP 133/78 | HR 77 | Wt 145.6 lb

## 2022-01-07 DIAGNOSIS — E039 Hypothyroidism, unspecified: Secondary | ICD-10-CM

## 2022-01-07 DIAGNOSIS — I872 Venous insufficiency (chronic) (peripheral): Secondary | ICD-10-CM

## 2022-01-07 DIAGNOSIS — D638 Anemia in other chronic diseases classified elsewhere: Secondary | ICD-10-CM

## 2022-01-07 DIAGNOSIS — N400 Enlarged prostate without lower urinary tract symptoms: Secondary | ICD-10-CM

## 2022-01-07 DIAGNOSIS — Z23 Encounter for immunization: Secondary | ICD-10-CM

## 2022-01-07 DIAGNOSIS — M7502 Adhesive capsulitis of left shoulder: Secondary | ICD-10-CM

## 2022-01-07 DIAGNOSIS — Z8739 Personal history of other diseases of the musculoskeletal system and connective tissue: Secondary | ICD-10-CM

## 2022-01-07 DIAGNOSIS — I10 Essential (primary) hypertension: Secondary | ICD-10-CM

## 2022-01-07 DIAGNOSIS — M7501 Adhesive capsulitis of right shoulder: Secondary | ICD-10-CM

## 2022-01-07 MED ORDER — FUROSEMIDE 20 MG PO TABS
20.0000 mg | ORAL_TABLET | Freq: Every day | ORAL | 1 refills | Status: DC
  Filled 2022-01-07 – 2022-02-04 (×2): qty 30, 30d supply, fill #0
  Filled 2022-03-07: qty 30, 30d supply, fill #1

## 2022-01-07 MED ORDER — DOXAZOSIN MESYLATE 4 MG PO TABS
4.0000 mg | ORAL_TABLET | Freq: Every day | ORAL | 4 refills | Status: DC
  Filled 2022-01-07 – 2022-02-04 (×2): qty 30, 30d supply, fill #0
  Filled 2022-03-07: qty 30, 30d supply, fill #1
  Filled 2022-04-08: qty 30, 30d supply, fill #2

## 2022-01-07 MED ORDER — ALLOPURINOL 100 MG PO TABS
100.0000 mg | ORAL_TABLET | Freq: Every day | ORAL | 5 refills | Status: DC
  Filled 2022-01-07 – 2022-02-04 (×2): qty 30, 30d supply, fill #0
  Filled 2022-03-07: qty 30, 30d supply, fill #1
  Filled 2022-04-08: qty 30, 30d supply, fill #2
  Filled 2022-05-06 (×2): qty 30, 30d supply, fill #3
  Filled 2022-06-08: qty 30, 30d supply, fill #4
  Filled 2022-09-23: qty 30, 30d supply, fill #5

## 2022-01-07 MED ORDER — LEVOTHYROXINE SODIUM 50 MCG PO TABS
50.0000 ug | ORAL_TABLET | Freq: Every day | ORAL | 6 refills | Status: DC
  Filled 2022-01-07 – 2022-02-04 (×2): qty 30, 30d supply, fill #0
  Filled 2022-03-07: qty 30, 30d supply, fill #1
  Filled 2022-04-08: qty 30, 30d supply, fill #2
  Filled 2022-05-06: qty 30, 30d supply, fill #3
  Filled 2022-06-08: qty 30, 30d supply, fill #4
  Filled 2022-09-23: qty 30, 30d supply, fill #5
  Filled 2022-12-14 – 2022-12-23 (×2): qty 30, 30d supply, fill #6

## 2022-01-07 NOTE — Progress Notes (Signed)
Patient ID: [name] [name], male    DOB: 02-24-1934  MRN: [id number]  CC: Medication Refill   Subjective: [name] [name] is a 88 y.o. male who presents for re-est care.  Last saw me 2018.  Son [name] [name], is with him and interprets but pt speaks some english as well His concerns today include:  history of HTN, hypothyroidism, BPH, normocytic anemia, gout  Primary concern today's request for referral to physical therapy.  Son and his wife would like for patient to achieve better range of motion in his upper arms especially the shoulders so that he is able to reach around to clean himself after he has a bowel movement.  Reports limited range of motion in both upper arms ever since he had fractured the left upper arm sometime last year.  Wore a cast for 3 months. He has difficulty getting the arms up above the head.  Denies any pain with attempted range of motion at the shoulders or the elbows.    HTN:  t supposed to be on bisoprolol daily.  However taking it as needed.  Son states he has not taken it in a few months.  When he takes it it makes his blood pressure drop below 1 causes dizziness which leads to falls.  He is taking low-dose furosemide daily. Checking BP daily.   Gives range 118-120/60-70s Reports being told in his country that his heart is weak.  He denies any chest pains or shortness of breath.  Does have lower extremity swelling but son states it has significantly improved since he started wearing the compression socks as was recommended by vein and vascular 2 weeks ago.  He was assessed to have venous insufficiency in both lower legs  Hypothyroid: Reports compliance with taking levothyroxine.  Recent TSH 1 month ago was 2.0.  Requests refill on allopurinol which he takes for history of gout.  No recent gout flares.  Also requests refill on Cardura which he takes for BPH.  Passing his urine okay.  Patient has a mild stable normocytic anemia.  Recent iron studies done consistent with anemia of  chronic disease.  Told by PA Cari Mayers to take an iron supplement daily which she has been doing.  He is eating and sleeping well.  He ambulates with a cane.  Patient Active Problem List   Diagnosis Date Noted   Hyperkalemia 12/07/2021   Edema 12/07/2021   Venous insufficiency of both lower extremities 12/07/2021   Eczema 12/07/2021   Hx of gout 01/24/2017   Hypothyroidism 10/30/2008   ANEMIA, NORMOCYTIC 09/18/2008   Essential hypertension 09/18/2008   BENIGN PROSTATIC HYPERTROPHY, WITH URINARY OBSTRUCTION 09/18/2008   BACK PAIN, CHRONIC 09/18/2008     Current Outpatient Medications on File Prior to Visit  Medication Sig Dispense Refill   Iron, Ferrous Sulfate, 325 (65 Fe) MG TABS Take 1 tablet (325 mg) by mouth every other day. 15 tablet 11   triamcinolone cream (KENALOG) 0.1 % Apply 1 application topically to the affected area(s) 2 (two) times daily. 30 g 0   aspirin EC 81 MG tablet Take 1 tablet (81 mg total) by mouth daily. Swallow whole. (Patient not taking: Reported on 12/21/2021) 30 tablet 12   bisoprolol (ZEBETA) 5 MG tablet Take 1 tablet (5 mg total) by mouth once daily. 30 tablet 2   No current facility-administered medications on file prior to visit.    No Known Allergies  Social History   Socioeconomic History   Marital status: Married  Spouse name: Not on file   Number of children: Not on file   Years of education: Not on file   Highest education level: Not on file  Occupational History   Not on file  Tobacco Use   Smoking status: Never   Smokeless tobacco: Never  Vaping Use   Vaping Use: Never used  Substance and Sexual Activity   Alcohol use: No   Drug use: No   Sexual activity: Not on file  Other Topics Concern   Not on file  Social History Narrative   Not on file   Social Determinants of Health   Financial Resource Strain: Not on file  Food Insecurity: Not on file  Transportation Needs: Not on file  Physical Activity: Not on file   Stress: Not on file  Social Connections: Not on file  Intimate Partner Violence: Not on file    Family History  Problem Relation Age of Onset   Stroke Neg Hx    Cancer Neg Hx     Past Surgical History:  Procedure Laterality Date   LAMINECTOMY  12/11/2008    Bilateral L2, L3, and L4 laminotomies, done by Dr. Lovell Sheehan    ROS: Review of Systems Negative except as stated above  PHYSICAL EXAM: BP 133/78   Pulse 77   Wt 145 lb 9.6 oz (66 kg)   SpO2 99%   BMI 19.75 kg/m   Physical Exam  General appearance -elderly [place] male, pleasant and in NAD. Mental status - normal mood, behavior, speech, dress, motor activity, and thought processes Neck - supple, no significant adenopathy Chest - clear to auscultation, no wheezes, rales or rhonchi, symmetric air entry Heart - normal rate, regular rhythm, normal S1, S2, no murmurs, rubs, clicks or gallops Musculoskeletal -elbows: He has good range of motion of both elbows. Left shoulder: No point tenderness.  Joint is mildly enlarged.  He is unable to actively elevate the arm above the head but can do so passively. Right shoulder: Joint is enlarged with soft tissue mass over the joint that appears to be lipoma.  No point tenderness.  Good passive range of motion. Extremities -trace to 1+ bilateral lower extremity edema with hyperpigmented changes on the lower third of the legs consistent with venous insufficiency.      Latest Ref Rng & Units 12/10/2021    5:49 PM 12/04/2021    4:31 AM 12/03/2021    8:47 PM  CMP  Glucose 70 - 99 mg/dL 94  84  72   BUN 8 - 23 mg/dL 33  25  29   Creatinine 0.61 - 1.24 mg/dL 8.92  1.19  4.17   Sodium 135 - 145 mmol/L 137  138  135   Potassium 3.5 - 5.1 mmol/L 5.2  4.5  5.8   Chloride 98 - 111 mmol/L 104  102  106   CO2 22 - 32 mmol/L 27   23   Calcium 8.9 - 10.3 mg/dL 9.5   9.1   Total Protein 6.5 - 8.1 g/dL 7.1   6.4   Total Bilirubin 0.3 - 1.2 mg/dL 0.7   0.6   Alkaline Phos 38 - 126 U/L 49   56    AST 15 - 41 U/L 20   28   ALT 0 - 44 U/L 14   5    Lipid Panel     Component Value Date/Time   CHOL 166 12/07/2021 1200   TRIG 76 12/07/2021 1200   HDL 48 12/07/2021 1200  CHOLHDL 3.5 12/07/2021 1200   CHOLHDL 5.2 08/31/2008 0515   VLDL 18 08/31/2008 0515   LDLCALC 104 (H) 12/07/2021 1200    CBC    Component Value Date/Time   WBC 5.7 12/10/2021 1749   RBC 3.47 (L) 12/10/2021 1749   HGB 10.0 (L) 12/10/2021 1749   HCT 31.7 (L) 12/10/2021 1749   PLT 310 12/10/2021 1749   MCV 91.4 12/10/2021 1749   MCH 28.8 12/10/2021 1749   MCHC 31.5 12/10/2021 1749   RDW 17.2 (H) 12/10/2021 1749   LYMPHSABS 2.4 12/10/2021 1749   MONOABS 0.6 12/10/2021 1749   EOSABS 0.6 (H) 12/10/2021 1749   BASOSABS 0.1 12/10/2021 1749   Lab Results  Component Value Date   IRON 33 (L) 12/07/2021   TIBC 184 (L) 12/07/2021   FERRITIN 294 12/07/2021    ASSESSMENT AND PLAN: 1. Adhesive capsulitis of both shoulders Patient with some degree of frozen shoulder bilaterally.  He is wanting to improve mobility in the shoulders.  He has no significant pain.  He is requesting referral to physical therapy.  Son desires that his range of motion be improved so that he is able to clean himself after he has a bowel movement. - Ambulatory referral to Physical Therapy  2. Essential hypertension Close to goal.  He has not been taking the bisoprolol as son states it makes his blood pressure drop too low.  Furosemide and Cardura have some blood pressure lowering effects and he seems to be doing okay on these. Advised to limit salt in the foods. Continue to monitor blood pressure at home with goal being 130/80 or lower. At some point in the future we need to check an echo given his report of being told in [place] that he has a weak heart  3. Hypothyroidism, unspecified type Controlled.  Continue current dose of levothyroxine - levothyroxine (SYNTHROID) 50 MCG tablet; Take 1 tablet (50 mcg total) by mouth once daily before  breakfast.  Dispense: 30 tablet; Refill: 6  4. Venous insufficiency of both lower extremities Continue wearing compression socks when he is up during the day - furosemide (LASIX) 20 MG tablet; Take 1 tablet (20 mg total) by mouth once daily.  Dispense: 30 tablet; Refill: 1  5. Anemia, chronic disease Most consistent with anemia of chronic disease.  Hemoglobin was stable without iron supplement.  I will recheck CBC given that he has been taking iron supplement daily - CBC  6. Hx of gout Recent uric acid level was 6.9 on current dose of allopurinol.  We will continue the 100 mg once a day. - allopurinol (ZYLOPRIM) 100 MG tablet; Take 1 tablet (100 mg total) by mouth once daily.  Dispense: 30 tablet; Refill: 5  7. Benign prostatic hyperplasia without lower urinary tract symptoms - doxazosin (CARDURA) 4 MG tablet; Take 1 tablet (4 mg total) by mouth once nightly at bedtime.  Dispense: 30 tablet; Refill: 4  8. Need for vaccination against Streptococcus pneumoniae Patient agreeable to receiving pneumonia vaccine. - Pneumococcal conjugate vaccine 20-valent  9. Need for shingles vaccine He is agreeable to receiving the shingles vaccine.  Advised that it can cause some redness and swelling at the injection site.  We will plan to give the second Shingrix shot on his follow-up visit in 4 months.     Patient was given the opportunity to ask questions.  Patient verbalized understanding of the plan and was able to repeat key elements of the plan.   This documentation was completed  using Paediatric nurse.  Any transcriptional errors are unintentional.  Orders Placed This Encounter  Procedures   Pneumococcal conjugate vaccine 20-valent   Varicella-zoster vaccine IM (Shingrix)   CBC   Ambulatory referral to Physical Therapy     Requested Prescriptions   Signed Prescriptions Disp Refills   levothyroxine (SYNTHROID) 50 MCG tablet 30 tablet 6    Sig: Take 1 tablet (50 mcg  total) by mouth once daily before breakfast.   doxazosin (CARDURA) 4 MG tablet 30 tablet 4    Sig: Take 1 tablet (4 mg total) by mouth once nightly at bedtime.   furosemide (LASIX) 20 MG tablet 30 tablet 1    Sig: Take 1 tablet (20 mg total) by mouth once daily.   allopurinol (ZYLOPRIM) 100 MG tablet 30 tablet 5    Sig: Take 1 tablet (100 mg total) by mouth once daily.    Return in about 4 months (around 05/10/2022).  Jonah Blue, MD, FACP

## 2022-01-07 NOTE — Progress Notes (Signed)
Patient is accompanied by son who states that the father need physical therapy referral.

## 2022-01-08 LAB — CBC
Hematocrit: 30.7 % — ABNORMAL LOW (ref 37.5–51.0)
Hemoglobin: 10.1 g/dL — ABNORMAL LOW (ref 13.0–17.7)
MCH: 29.5 pg (ref 26.6–33.0)
MCHC: 32.9 g/dL (ref 31.5–35.7)
MCV: 90 fL (ref 79–97)
Platelets: 219 10*3/uL (ref 150–450)
RBC: 3.42 x10E6/uL — ABNORMAL LOW (ref 4.14–5.80)
RDW: 14.3 % (ref 11.6–15.4)
WBC: 4.7 10*3/uL (ref 3.4–10.8)

## 2022-02-04 ENCOUNTER — Other Ambulatory Visit: Payer: Self-pay

## 2022-03-06 ENCOUNTER — Ambulatory Visit (HOSPITAL_COMMUNITY)
Admission: EM | Admit: 2022-03-06 | Discharge: 2022-03-06 | Disposition: A | Discharge status: Home / Self Care | Payer: Self-pay | Attending: Family Medicine | Admitting: Family Medicine

## 2022-03-06 ENCOUNTER — Ambulatory Visit (INDEPENDENT_AMBULATORY_CARE_PROVIDER_SITE_OTHER): Payer: Self-pay

## 2022-03-06 ENCOUNTER — Encounter (HOSPITAL_COMMUNITY): Payer: Self-pay

## 2022-03-06 DIAGNOSIS — Z20822 Contact with and (suspected) exposure to covid-19: Secondary | ICD-10-CM | POA: Insufficient documentation

## 2022-03-06 DIAGNOSIS — R059 Cough, unspecified: Secondary | ICD-10-CM

## 2022-03-06 DIAGNOSIS — R051 Acute cough: Secondary | ICD-10-CM

## 2022-03-06 DIAGNOSIS — R531 Weakness: Secondary | ICD-10-CM

## 2022-03-06 LAB — SARS CORONAVIRUS 2 BY RT PCR: SARS Coronavirus 2 by RT PCR: NEGATIVE

## 2022-03-06 NOTE — Discharge Instructions (Addendum)
The chest x-ray was clear and did not show any pneumonia or fluid  Blood work has been drawn for a blood count and to check sodium, potassium, and sugar.  Staff will notify you if there is anything significantly abnormal   You have been swabbed for COVID, and the test will result in the next 24 hours. Our staff will call you if positive. If the test is positive, you should quarantine for 5 days from the start of your symptoms   Please speak with your primary care office and make a follow-up appointment  Go to the emergency room if you are feeling any worse

## 2022-03-06 NOTE — ED Provider Notes (Signed)
[facility]    CSN: [id number] Arrival date & time: 03/06/22  1441      History   Chief Complaint No chief complaint on file.   HPI [name] [name] is a 88 y.o. male.   HPI Here for weakness its been going on for about 4 days, since September 6.  He has also had some phlegm and some cough that is been going on for about 2 weeks.  No fever noted.  No nausea or vomiting or diarrhea.  His appetite is decreased.  Family who is here with him translates for him.  They state he is no longer taking any medications for blood pressure.  Blood pressure here is 126/72  No fall or motor weakness noted; not really short of breath  Past Medical History:  Diagnosis Date   Back pain 10/2008   per MRI 11/06/2008 - severe spinal stenosis worse at L4-5, L3-4, with neural compression at those levels   BPH (benign prostatic hyperplasia)    Eczema    Gout    Hypertension    Hypothyroidism     Patient Active Problem List   Diagnosis Date Noted   Hyperkalemia 12/07/2021   Edema 12/07/2021   Venous insufficiency of both lower extremities 12/07/2021   Eczema 12/07/2021   Hx of gout 01/24/2017   Hypothyroidism 10/30/2008   Anemia, chronic disease 09/18/2008   Essential hypertension 09/18/2008   BENIGN PROSTATIC HYPERTROPHY, WITH URINARY OBSTRUCTION 09/18/2008   BACK PAIN, CHRONIC 09/18/2008    Past Surgical History:  Procedure Laterality Date   LAMINECTOMY  12/11/2008    Bilateral L2, L3, and L4 laminotomies, done by Dr. Arnoldo Morale       Home Medications    Prior to Admission medications   Medication Sig Start Date End Date Taking? Authorizing Provider  allopurinol (ZYLOPRIM) 100 MG tablet Take 1 tablet (100 mg total) by mouth once daily. 01/07/22   Ladell Pier, MD  aspirin EC 81 MG tablet Take 1 tablet (81 mg total) by mouth daily. Swallow whole. Patient not taking: Reported on 12/21/2021 12/07/21   Mayers, Cari S, PA-C  bisoprolol (ZEBETA) 5 MG tablet Take 1 tablet (5 mg  total) by mouth once daily. 12/07/21   Mayers, Cari S, PA-C  doxazosin (CARDURA) 4 MG tablet Take 1 tablet (4 mg total) by mouth once nightly at bedtime. 01/07/22   Ladell Pier, MD  furosemide (LASIX) 20 MG tablet Take 1 tablet (20 mg total) by mouth once daily. 01/07/22   Ladell Pier, MD  Iron, Ferrous Sulfate, 325 (65 Fe) MG TABS Take 1 tablet (325 mg) by mouth every other day. 12/09/21   Mayers, Cari S, PA-C  levothyroxine (SYNTHROID) 50 MCG tablet Take 1 tablet (50 mcg total) by mouth once daily before breakfast. 01/07/22   Ladell Pier, MD  triamcinolone cream (KENALOG) 0.1 % Apply 1 application topically to the affected area(s) 2 (two) times daily. 12/07/21   Mayers, Loraine Grip, PA-C    Family History Family History  Problem Relation Age of Onset   Stroke Neg Hx    Cancer Neg Hx     Social History Social History   Tobacco Use   Smoking status: Never   Smokeless tobacco: Never  Vaping Use   Vaping Use: Never used  Substance Use Topics   Alcohol use: No   Drug use: No     Allergies   Patient has no known allergies.   Review of Systems Review of Systems  Physical Exam Triage Vital Signs ED Triage Vitals  Enc Vitals Group     BP 03/06/22 1507 126/72     Pulse Rate 03/06/22 1507 82     Resp 03/06/22 1507 12     Temp 03/06/22 1507 98.2 F (36.8 C)     Temp Source 03/06/22 1507 Oral     SpO2 03/06/22 1507 98 %     Weight --      Height 03/06/22 1505 '5\' 3"'  (1.6 m)     Head Circumference --      Peak Flow --      Pain Score 03/06/22 1506 0     Pain Loc --      Pain Edu? --      Excl. in [place]? --    No data found.  Updated Vital Signs BP 126/72 (BP Location: Right Arm)   Pulse 82   Temp 98.2 F (36.8 C) (Oral)   Resp 12   Ht '5\' 3"'  (1.6 m)   SpO2 98%   BMI 25.79 kg/m   Visual Acuity Right Eye Distance:   Left Eye Distance:   Bilateral Distance:    Right Eye Near:   Left Eye Near:    Bilateral Near:     Physical Exam Vitals reviewed.   Constitutional:      General: He is not in acute distress.    Appearance: He is not toxic-appearing.  HENT:     Right Ear: Ear canal normal.     Left Ear: Ear canal normal.     Ears:     Comments: Tympanic membrane's are bilaterally obscured by cerumen    Nose: Nose normal.     Mouth/Throat:     Mouth: Mucous membranes are moist.     Pharynx: No oropharyngeal exudate or posterior oropharyngeal erythema.  Eyes:     Extraocular Movements: Extraocular movements intact.     Conjunctiva/sclera: Conjunctivae normal.     Pupils: Pupils are equal, round, and reactive to light.  Cardiovascular:     Rate and Rhythm: Normal rate and regular rhythm.     Heart sounds: No murmur heard. Pulmonary:     Effort: Pulmonary effort is normal.     Breath sounds: No stridor. No rhonchi or rales.     Comments: Air movement is fairly good, but there are possibly a couple of end expiratory wheezes heard bilaterally Musculoskeletal:     Cervical back: Neck supple.     Right lower leg: No edema.     Left lower leg: No edema.  Lymphadenopathy:     Cervical: No cervical adenopathy.  Skin:    Capillary Refill: Capillary refill takes less than 2 seconds.     Coloration: Skin is not jaundiced or pale.  Neurological:     General: No focal deficit present.     Mental Status: He is alert and oriented to person, place, and time.  Psychiatric:        Behavior: Behavior normal.      UC Treatments / Results  Labs (all labs ordered are listed, but only abnormal results are displayed) Labs Reviewed  SARS CORONAVIRUS 2 BY RT PCR  CBC  BASIC METABOLIC PANEL    EKG   Radiology DG Chest 2 View  Result Date: 03/06/2022 CLINICAL DATA:  Cough and congestion for 2 weeks. EXAM: CHEST - 2 VIEW COMPARISON:  12/10/2021 CT, radiograph and prior studies FINDINGS: The cardiomediastinal silhouette is unremarkable. There is no evidence of focal airspace disease,  pulmonary edema, suspicious pulmonary nodule/mass,  pleural effusion, or pneumothorax. No acute bony abnormalities are identified. Displaced proximal LEFT humeral fracture again noted. IMPRESSION: No active cardiopulmonary disease. Electronically Signed   By: Margarette Canada M.D.   On: 03/06/2022 15:49    Procedures Procedures (including critical care time)  Medications Ordered in UC Medications - No data to display  Initial Impression / Assessment and Plan / UC Course  I have reviewed the triage vital signs and the nursing notes.  Pertinent labs & imaging results that were available during my care of the patient were reviewed by me and considered in my medical decision making (see chart for details).        Chest x-ray does not show any infiltrate or fluid.  He does have an old nonhealed fracture of his left upper humerus.  I did confirm with his son that that is an old injury.  CBC, BMP, and COVID swab are done today.  We will notify him if COVID is positive and he should have a prescription for Paxlovid with renal dosing if positive.  His last EGFR in June was 54.  I also asked him to follow-up with his primary care  If he worsens in any way with altered mental status, or increasing weakness or shortness of breath, they are to present to the emergency room   Final Clinical Impressions(s) / UC Diagnoses   Final diagnoses:  Weakness  Acute cough     Discharge Instructions      The chest x-ray was clear and did not show any pneumonia or fluid  Blood work has been drawn for a blood count and to check sodium, potassium, and sugar.  Staff will notify you if there is anything significantly abnormal   You have been swabbed for COVID, and the test will result in the next 24 hours. Our staff will call you if positive. If the test is positive, you should quarantine for 5 days from the start of your symptoms   Please speak with your primary care office and make a follow-up appointment  Go to the emergency room if you are feeling any  worse     ED Prescriptions   None    PDMP not reviewed this encounter.   Barrett Henle, MD 03/06/22 [phone]

## 2022-03-06 NOTE — ED Triage Notes (Signed)
Pt is here for cough with plegm, fatigue x4 days, pt denies fever, vomiting or diarrhea

## 2022-03-06 NOTE — ED Notes (Signed)
Unable to obtain blood work, notified provider Dr.Pam [employer] account executive.

## 2022-03-07 ENCOUNTER — Other Ambulatory Visit: Payer: Self-pay

## 2022-03-08 ENCOUNTER — Other Ambulatory Visit (HOSPITAL_COMMUNITY): Payer: Self-pay

## 2022-03-08 ENCOUNTER — Other Ambulatory Visit: Payer: Self-pay

## 2022-03-08 ENCOUNTER — Other Ambulatory Visit: Payer: Self-pay | Admitting: Internal Medicine

## 2022-03-08 DIAGNOSIS — I10 Essential (primary) hypertension: Secondary | ICD-10-CM

## 2022-03-08 MED ORDER — BISOPROLOL FUMARATE 5 MG PO TABS
5.0000 mg | ORAL_TABLET | Freq: Every day | ORAL | 0 refills | Status: DC
  Filled 2022-03-08: qty 30, 30d supply, fill #0

## 2022-03-09 ENCOUNTER — Other Ambulatory Visit: Payer: Self-pay

## 2022-03-10 ENCOUNTER — Ambulatory Visit: Payer: Self-pay | Attending: Family Medicine | Admitting: Family Medicine

## 2022-03-10 ENCOUNTER — Encounter: Payer: Self-pay | Admitting: Family Medicine

## 2022-03-10 ENCOUNTER — Other Ambulatory Visit: Payer: Self-pay

## 2022-03-10 VITALS — BP 116/61 | HR 86 | Temp 97.9°F | Ht 72.0 in | Wt 132.8 lb

## 2022-03-10 DIAGNOSIS — R052 Subacute cough: Secondary | ICD-10-CM

## 2022-03-10 DIAGNOSIS — R630 Anorexia: Secondary | ICD-10-CM

## 2022-03-10 MED ORDER — CETIRIZINE HCL 10 MG PO TABS
10.0000 mg | ORAL_TABLET | Freq: Every day | ORAL | 1 refills | Status: DC
  Filled 2022-03-10: qty 30, 30d supply, fill #0
  Filled 2022-04-08: qty 30, 30d supply, fill #1

## 2022-03-10 MED ORDER — FLUTICASONE PROPIONATE 50 MCG/ACT NA SUSP
2.0000 | Freq: Every day | NASAL | 1 refills | Status: DC
  Filled 2022-03-10: qty 16, 30d supply, fill #0
  Filled 2022-04-08: qty 16, 30d supply, fill #1

## 2022-03-10 NOTE — Patient Instructions (Signed)

## 2022-03-10 NOTE — Progress Notes (Signed)
Cough Weakness No appetite.

## 2022-03-10 NOTE — Progress Notes (Signed)
Subjective:  Patient ID: [name] [name], male    DOB: 10-Aug-1933  Age: 88 y.o. MRN: [id number]  CC: Hospitalization Follow-up   HPI [name] [name] is a 88 y.o. year old male patient of Dr Wynetta Emery with a history of Hypothyroidism, Hypertension, Gout here for an acute visit. Daughter presents with him and serves as Astronomer.  Interval History:  He has been coughing up phlegm for 3 weeks which has been present all the ime. He has no shortness of breath, wheezing and symptoms occur all day. He has had no fever and no recent travel.Endorses presence of post nasal drip. His appetite has also decreased and he has been weak. He has had no abdominal pan, nausea, vomiting, diarrhea, constipation.  He presented to the ED 4 days ago and chest x-ray revealed no acute cardiopulmonary disease. Labs were not done.  Past Medical History:  Diagnosis Date   Back pain 10/2008   per MRI 11/06/2008 - severe spinal stenosis worse at L4-5, L3-4, with neural compression at those levels   BPH (benign prostatic hyperplasia)    Eczema    Gout    Hypertension    Hypothyroidism     Past Surgical History:  Procedure Laterality Date   LAMINECTOMY  12/11/2008    Bilateral L2, L3, and L4 laminotomies, done by Dr. Arnoldo Morale    Family History  Problem Relation Age of Onset   Stroke Neg Hx    Cancer Neg Hx     Social History   Socioeconomic History   Marital status: Married    Spouse name: Not on file   Number of children: Not on file   Years of education: Not on file   Highest education level: Not on file  Occupational History   Not on file  Tobacco Use   Smoking status: Never   Smokeless tobacco: Never  Vaping Use   Vaping Use: Never used  Substance and Sexual Activity   Alcohol use: No   Drug use: No   Sexual activity: Not Currently  Other Topics Concern   Not on file  Social History Narrative   Not on file   Social Determinants of Health   Financial Resource Strain: Not on file  Food  Insecurity: Not on file  Transportation Needs: Not on file  Physical Activity: Not on file  Stress: Not on file  Social Connections: Not on file    No Known Allergies  Outpatient Medications Prior to Visit  Medication Sig Dispense Refill   allopurinol (ZYLOPRIM) 100 MG tablet Take 1 tablet (100 mg total) by mouth once daily. 30 tablet 5   bisoprolol (ZEBETA) 5 MG tablet Take 1 tablet (5 mg total) by mouth once daily. 30 tablet 0   doxazosin (CARDURA) 4 MG tablet Take 1 tablet (4 mg total) by mouth once nightly at bedtime. 30 tablet 4   furosemide (LASIX) 20 MG tablet Take 1 tablet (20 mg total) by mouth once daily. 30 tablet 1   Iron, Ferrous Sulfate, 325 (65 Fe) MG TABS Take 1 tablet (325 mg) by mouth every other day. 15 tablet 11   levothyroxine (SYNTHROID) 50 MCG tablet Take 1 tablet (50 mcg total) by mouth once daily before breakfast. 30 tablet 6   triamcinolone cream (KENALOG) 0.1 % Apply 1 application topically to the affected area(s) 2 (two) times daily. 30 g 0   aspirin EC 81 MG tablet Take 1 tablet (81 mg total) by mouth daily. Swallow whole. (Patient not taking: Reported on 12/21/2021)  30 tablet 12   No facility-administered medications prior to visit.     ROS Review of Systems  Constitutional:  Positive for appetite change. Negative for activity change.  HENT:  Negative for sinus pressure and sore throat.   Respiratory:  Positive for cough. Negative for chest tightness, shortness of breath and wheezing.   Cardiovascular:  Negative for chest pain and palpitations.  Gastrointestinal:  Negative for abdominal distention, abdominal pain and constipation.  Genitourinary: Negative.   Musculoskeletal: Negative.   Neurological:  Positive for weakness.  Psychiatric/Behavioral:  Negative for behavioral problems and dysphoric mood.     Objective:  BP 116/61   Pulse 86   Temp 97.9 F (36.6 C) (Oral)   Ht 6' (1.829 m)   Wt 132 lb 12.8 oz (60.2 kg)   SpO2 100%   BMI 18.01  kg/m      03/10/2022   11:10 AM 03/06/2022    3:07 PM 01/07/2022    2:46 PM  BP/Weight  Systolic BP 836 [id number] 476  Diastolic BP 61 72 78  Wt. (Lbs) 132.8    BMI 18.01 kg/m2        Physical Exam Constitutional:      Appearance: He is well-developed.  HENT:     Right Ear: There is impacted cerumen.     Left Ear: There is impacted cerumen.     Mouth/Throat:     Mouth: Mucous membranes are moist.  Cardiovascular:     Rate and Rhythm: Normal rate.     Heart sounds: Normal heart sounds. No murmur heard. Pulmonary:     Effort: Pulmonary effort is normal.     Breath sounds: Normal breath sounds. No wheezing or rales.  Chest:     Chest wall: No tenderness.  Abdominal:     General: Bowel sounds are normal. There is no distension.     Palpations: Abdomen is soft. There is no mass.     Tenderness: There is no abdominal tenderness.  Musculoskeletal:        General: Normal range of motion.     Right lower leg: No edema.     Left lower leg: No edema.  Neurological:     Mental Status: He is alert and oriented to person, place, and time.  Psychiatric:        Mood and Affect: Mood normal.        Latest Ref Rng & Units 12/10/2021    5:49 PM 12/04/2021    4:31 AM 12/03/2021    8:47 PM  CMP  Glucose 70 - 99 mg/dL 94  84  72   BUN 8 - 23 mg/dL 33  25  29   Creatinine 0.61 - 1.24 mg/dL 1.45  1.30  1.44   Sodium 135 - 145 mmol/L 137  138  135   Potassium 3.5 - 5.1 mmol/L 5.2  4.5  5.8   Chloride 98 - 111 mmol/L 104  102  106   CO2 22 - 32 mmol/L 27   23   Calcium 8.9 - 10.3 mg/dL 9.5   9.1   Total Protein 6.5 - 8.1 g/dL 7.1   6.4   Total Bilirubin 0.3 - 1.2 mg/dL 0.7   0.6   Alkaline Phos 38 - 126 U/L 49   56   AST 15 - 41 U/L 20   28   ALT 0 - 44 U/L 14   5     Lipid Panel     Component Value Date/Time  CHOL 166 12/07/2021 1200   TRIG 76 12/07/2021 1200   HDL 48 12/07/2021 1200   CHOLHDL 3.5 12/07/2021 1200   CHOLHDL 5.2 08/31/2008 0515   VLDL 18 08/31/2008 0515    LDLCALC 104 (H) 12/07/2021 1200    CBC    Component Value Date/Time   WBC 4.7 01/07/2022 1510   WBC 5.7 12/10/2021 1749   RBC 3.42 (L) 01/07/2022 1510   RBC 3.47 (L) 12/10/2021 1749   HGB 10.1 (L) 01/07/2022 1510   HCT 30.7 (L) 01/07/2022 1510   PLT 219 01/07/2022 1510   MCV 90 01/07/2022 1510   MCH 29.5 01/07/2022 1510   MCH 28.8 12/10/2021 1749   MCHC 32.9 01/07/2022 1510   MCHC 31.5 12/10/2021 1749   RDW 14.3 01/07/2022 1510   LYMPHSABS 2.4 12/10/2021 1749   MONOABS 0.6 12/10/2021 1749   EOSABS 0.6 (H) 12/10/2021 1749   BASOSABS 0.1 12/10/2021 1749    No results found for: "HGBA1C"  Lab Results  Component Value Date   TSH 2.021 12/04/2021    Assessment & Plan:  1. Loss of appetite Unknown etiology We will send of labs Advised to make smoothies and ingest protein shakes in the light of appetite loss Loss of appetite also accounting for weakness. If labs are unrevealing consider abdominal imaging - CMP14+EGFR - CBC with Differential/Platelet  2. Subacute cough Chest x-ray unrevealing Symptoms are suspicious for sinus etiology - cetirizine (ZYRTEC) 10 MG tablet; Take 1 tablet (10 mg total) by mouth daily.  Dispense: 30 tablet; Refill: 1 - fluticasone (FLONASE) 50 MCG/ACT nasal spray; Place 2 sprays into both nostrils daily.  Dispense: 16 g; Refill: 1    Meds ordered this encounter  Medications   cetirizine (ZYRTEC) 10 MG tablet    Sig: Take 1 tablet (10 mg total) by mouth daily.    Dispense:  30 tablet    Refill:  1   fluticasone (FLONASE) 50 MCG/ACT nasal spray    Sig: Place 2 sprays into both nostrils daily.    Dispense:  16 g    Refill:  1    Follow-up: Return if symptoms worsen or fail to improve, for previously scheduled appointment.       Charlott Rakes, MD, FAAFP. [facility] [place], [place]   03/10/2022, 12:31 PM

## 2022-03-11 ENCOUNTER — Other Ambulatory Visit: Payer: Self-pay | Admitting: Family Medicine

## 2022-03-11 DIAGNOSIS — N1832 Chronic kidney disease, stage 3b: Secondary | ICD-10-CM

## 2022-03-11 LAB — CBC WITH DIFFERENTIAL/PLATELET
Basophils Absolute: 0.1 10*3/uL (ref 0.0–0.2)
Basos: 1 %
EOS (ABSOLUTE): 0.2 10*3/uL (ref 0.0–0.4)
Eos: 5 %
Hematocrit: 32.1 % — ABNORMAL LOW (ref 37.5–51.0)
Hemoglobin: 11 g/dL — ABNORMAL LOW (ref 13.0–17.7)
Immature Grans (Abs): 0 10*3/uL (ref 0.0–0.1)
Immature Granulocytes: 1 %
Lymphocytes Absolute: 1.9 10*3/uL (ref 0.7–3.1)
Lymphs: 42 %
MCH: 29.2 pg (ref 26.6–33.0)
MCHC: 34.3 g/dL (ref 31.5–35.7)
MCV: 85 fL (ref 79–97)
Monocytes Absolute: 0.3 10*3/uL (ref 0.1–0.9)
Monocytes: 7 %
Neutrophils Absolute: 2 10*3/uL (ref 1.4–7.0)
Neutrophils: 44 %
Platelets: 311 10*3/uL (ref 150–450)
RBC: 3.77 x10E6/uL — ABNORMAL LOW (ref 4.14–5.80)
RDW: 13 % (ref 11.6–15.4)
WBC: 4.5 10*3/uL (ref 3.4–10.8)

## 2022-03-11 LAB — CMP14+EGFR
ALT: 8 IU/L (ref 0–44)
AST: 10 IU/L (ref 0–40)
Albumin/Globulin Ratio: 1.1 — ABNORMAL LOW (ref 1.2–2.2)
Albumin: 4 g/dL (ref 3.7–4.7)
Alkaline Phosphatase: 71 IU/L (ref 44–121)
BUN/Creatinine Ratio: 27 — ABNORMAL HIGH (ref 10–24)
BUN: 49 mg/dL — ABNORMAL HIGH (ref 8–27)
Bilirubin Total: 0.4 mg/dL (ref 0.0–1.2)
CO2: 24 mmol/L (ref 20–29)
Calcium: 10.1 mg/dL (ref 8.6–10.2)
Chloride: 101 mmol/L (ref 96–106)
Creatinine, Ser: 1.82 mg/dL — ABNORMAL HIGH (ref 0.76–1.27)
Globulin, Total: 3.7 g/dL (ref 1.5–4.5)
Glucose: 86 mg/dL (ref 70–99)
Potassium: 5 mmol/L (ref 3.5–5.2)
Sodium: 139 mmol/L (ref 134–144)
Total Protein: 7.7 g/dL (ref 6.0–8.5)
eGFR: 36 mL/min/{1.73_m2} — ABNORMAL LOW (ref >59)

## 2022-03-18 ENCOUNTER — Telehealth: Payer: Self-pay | Admitting: Emergency Medicine

## 2022-03-18 ENCOUNTER — Ambulatory Visit: Payer: Self-pay | Attending: Internal Medicine

## 2022-03-18 DIAGNOSIS — N1832 Chronic kidney disease, stage 3b: Secondary | ICD-10-CM

## 2022-03-18 NOTE — Telephone Encounter (Signed)
Call placed to number provided and VM was left informing patient to return phone call.

## 2022-03-18 NOTE — Telephone Encounter (Signed)
Patient is here in the office for lab work and is complaining of having issues with swallowing and is having issues SHOB. Ask to a CB

## 2022-03-19 LAB — BASIC METABOLIC PANEL
BUN/Creatinine Ratio: 28 — ABNORMAL HIGH (ref 10–24)
BUN: 40 mg/dL — ABNORMAL HIGH (ref 8–27)
CO2: 21 mmol/L (ref 20–29)
Calcium: 9.4 mg/dL (ref 8.6–10.2)
Chloride: 95 mmol/L — ABNORMAL LOW (ref 96–106)
Creatinine, Ser: 1.45 mg/dL — ABNORMAL HIGH (ref 0.76–1.27)
Glucose: 87 mg/dL (ref 70–99)
Potassium: 4.4 mmol/L (ref 3.5–5.2)
Sodium: 133 mmol/L — ABNORMAL LOW (ref 134–144)
eGFR: 47 mL/min/{1.73_m2} — ABNORMAL LOW (ref >59)

## 2022-03-31 ENCOUNTER — Encounter (HOSPITAL_COMMUNITY): Payer: Self-pay

## 2022-03-31 ENCOUNTER — Emergency Department (HOSPITAL_COMMUNITY)
Admission: EM | Admit: 2022-03-31 | Discharge: 2022-04-01 | Discharge status: Against Medical Advice | Payer: Self-pay | Attending: Student | Admitting: Student

## 2022-03-31 ENCOUNTER — Emergency Department (HOSPITAL_COMMUNITY): Payer: Self-pay

## 2022-03-31 DIAGNOSIS — I10 Essential (primary) hypertension: Secondary | ICD-10-CM | POA: Insufficient documentation

## 2022-03-31 DIAGNOSIS — E039 Hypothyroidism, unspecified: Secondary | ICD-10-CM | POA: Insufficient documentation

## 2022-03-31 DIAGNOSIS — R1310 Dysphagia, unspecified: Secondary | ICD-10-CM | POA: Insufficient documentation

## 2022-03-31 DIAGNOSIS — Z5321 Procedure and treatment not carried out due to patient leaving prior to being seen by health care provider: Secondary | ICD-10-CM | POA: Insufficient documentation

## 2022-03-31 DIAGNOSIS — R634 Abnormal weight loss: Secondary | ICD-10-CM | POA: Insufficient documentation

## 2022-03-31 LAB — CBC WITH DIFFERENTIAL/PLATELET
Abs Immature Granulocytes: 0.38 10*3/uL — ABNORMAL HIGH (ref 0.00–0.07)
Basophils Absolute: 0 10*3/uL (ref 0.0–0.1)
Basophils Relative: 0 %
Eosinophils Absolute: 0 10*3/uL (ref 0.0–0.5)
Eosinophils Relative: 0 %
HCT: 30.5 % — ABNORMAL LOW (ref 39.0–52.0)
Hemoglobin: 10.2 g/dL — ABNORMAL LOW (ref 13.0–17.0)
Immature Granulocytes: 3 %
Lymphocytes Relative: 6 %
Lymphs Abs: 0.9 10*3/uL (ref 0.7–4.0)
MCH: 28.9 pg (ref 26.0–34.0)
MCHC: 33.4 g/dL (ref 30.0–36.0)
MCV: 86.4 fL (ref 80.0–100.0)
Monocytes Absolute: 0.4 10*3/uL (ref 0.1–1.0)
Monocytes Relative: 3 %
Neutro Abs: 12.3 10*3/uL — ABNORMAL HIGH (ref 1.7–7.7)
Neutrophils Relative %: 88 %
Platelets: 288 10*3/uL (ref 150–400)
RBC: 3.53 MIL/uL — ABNORMAL LOW (ref 4.22–5.81)
RDW: 14.7 % (ref 11.5–15.5)
WBC: 13.9 10*3/uL — ABNORMAL HIGH (ref 4.0–10.5)
nRBC: 0 % (ref 0.0–0.2)

## 2022-03-31 LAB — COMPREHENSIVE METABOLIC PANEL
ALT: 9 U/L (ref 0–44)
AST: 16 U/L (ref 15–41)
Albumin: 3.1 g/dL — ABNORMAL LOW (ref 3.5–5.0)
Alkaline Phosphatase: 52 U/L (ref 38–126)
Anion gap: 10 (ref 5–15)
BUN: 65 mg/dL — ABNORMAL HIGH (ref 8–23)
CO2: 22 mmol/L (ref 22–32)
Calcium: 9.2 mg/dL (ref 8.9–10.3)
Chloride: 97 mmol/L — ABNORMAL LOW (ref 98–111)
Creatinine, Ser: 2.49 mg/dL — ABNORMAL HIGH (ref 0.61–1.24)
GFR, Estimated: 25 mL/min — ABNORMAL LOW (ref >60)
Glucose, Bld: 91 mg/dL (ref 70–99)
Potassium: 4.2 mmol/L (ref 3.5–5.1)
Sodium: 129 mmol/L — ABNORMAL LOW (ref 135–145)
Total Bilirubin: 1 mg/dL (ref 0.3–1.2)
Total Protein: 7.1 g/dL (ref 6.5–8.1)

## 2022-03-31 MED ORDER — SODIUM CHLORIDE 0.9 % IV BOLUS: 1000.0000 mL | Freq: Once | INTRAVENOUS | Status: DC

## 2022-03-31 NOTE — ED Triage Notes (Signed)
Pt presents with c/o dysphagia. Pt's family reports he has had these issues for several months but had been unable to keep food, drink, and medicine down over the last several days. No acute distress noted in triage.

## 2022-03-31 NOTE — ED Provider Triage Note (Signed)
Emergency Medicine Provider Triage Evaluation Note  [name] [name] , a 88 y.o. male  was evaluated in triage.  Patient has a past medical history of hypertension and hypothyroidism.  He is presenting today due to dysphagia that is been going on for 2 months.  He went to his PCP and they told him that if it ever gets worse to come here.  Patient has decreased p.o. intake.  Review of Systems  Positive: Weight loss and dysphagia Negative: Chest pain, shortness of breath, fever, chills  Physical Exam  BP 114/65   Pulse 80   Temp 98.8 F (37.1 C) (Oral)   Resp 18   SpO2 99%  Gen:   Awake, no distress   Resp:  Normal effort  MSK:   Moves extremities without difficulty  Other:  Airway clear, tolerating secretions.  No noted or palpated masses  Medical Decision Making  Medically screening exam initiated at 3:44 PM.  Appropriate orders placed.  [name] [name] was informed that the remainder of the evaluation will be completed by another provider, this initial triage assessment does not replace that evaluation, and the importance of remaining in the ED until their evaluation is complete.     Rhae Hammock, PA-C 03/31/22 1547

## 2022-04-01 ENCOUNTER — Inpatient Hospital Stay (HOSPITAL_BASED_OUTPATIENT_CLINIC_OR_DEPARTMENT_OTHER)
Admission: EM | Admit: 2022-04-01 | Discharge: 2022-04-09 | DRG: 177 | Disposition: A | Discharge status: Home / Self Care | Payer: Self-pay | Attending: Internal Medicine | Admitting: Internal Medicine

## 2022-04-01 ENCOUNTER — Encounter (HOSPITAL_BASED_OUTPATIENT_CLINIC_OR_DEPARTMENT_OTHER): Payer: Self-pay

## 2022-04-01 ENCOUNTER — Encounter (HOSPITAL_COMMUNITY): Payer: Self-pay

## 2022-04-01 ENCOUNTER — Emergency Department (HOSPITAL_BASED_OUTPATIENT_CLINIC_OR_DEPARTMENT_OTHER): Payer: Self-pay

## 2022-04-01 ENCOUNTER — Emergency Department (HOSPITAL_BASED_OUTPATIENT_CLINIC_OR_DEPARTMENT_OTHER): Payer: Self-pay | Admitting: Radiology

## 2022-04-01 ENCOUNTER — Other Ambulatory Visit: Payer: Self-pay

## 2022-04-01 DIAGNOSIS — E861 Hypovolemia: Secondary | ICD-10-CM | POA: Diagnosis present

## 2022-04-01 DIAGNOSIS — R4701 Aphasia: Secondary | ICD-10-CM | POA: Diagnosis present

## 2022-04-01 DIAGNOSIS — R7989 Other specified abnormal findings of blood chemistry: Secondary | ICD-10-CM | POA: Diagnosis present

## 2022-04-01 DIAGNOSIS — I129 Hypertensive chronic kidney disease with stage 1 through stage 4 chronic kidney disease, or unspecified chronic kidney disease: Secondary | ICD-10-CM | POA: Diagnosis present

## 2022-04-01 DIAGNOSIS — Z79899 Other long term (current) drug therapy: Secondary | ICD-10-CM

## 2022-04-01 DIAGNOSIS — B965 Pseudomonas (aeruginosa) (mallei) (pseudomallei) as the cause of diseases classified elsewhere: Secondary | ICD-10-CM | POA: Diagnosis present

## 2022-04-01 DIAGNOSIS — I951 Orthostatic hypotension: Secondary | ICD-10-CM | POA: Diagnosis present

## 2022-04-01 DIAGNOSIS — E860 Dehydration: Secondary | ICD-10-CM | POA: Diagnosis present

## 2022-04-01 DIAGNOSIS — D509 Iron deficiency anemia, unspecified: Secondary | ICD-10-CM | POA: Diagnosis present

## 2022-04-01 DIAGNOSIS — E43 Unspecified severe protein-calorie malnutrition: Secondary | ICD-10-CM | POA: Diagnosis present

## 2022-04-01 DIAGNOSIS — I4510 Unspecified right bundle-branch block: Secondary | ICD-10-CM | POA: Diagnosis present

## 2022-04-01 DIAGNOSIS — R1310 Dysphagia, unspecified: Secondary | ICD-10-CM

## 2022-04-01 DIAGNOSIS — R627 Adult failure to thrive: Secondary | ICD-10-CM | POA: Diagnosis present

## 2022-04-01 DIAGNOSIS — M549 Dorsalgia, unspecified: Secondary | ICD-10-CM | POA: Diagnosis present

## 2022-04-01 DIAGNOSIS — A419 Sepsis, unspecified organism: Secondary | ICD-10-CM

## 2022-04-01 DIAGNOSIS — R1312 Dysphagia, oropharyngeal phase: Secondary | ICD-10-CM

## 2022-04-01 DIAGNOSIS — J189 Pneumonia, unspecified organism: Principal | ICD-10-CM

## 2022-04-01 DIAGNOSIS — N179 Acute kidney failure, unspecified: Secondary | ICD-10-CM | POA: Diagnosis present

## 2022-04-01 DIAGNOSIS — N400 Enlarged prostate without lower urinary tract symptoms: Secondary | ICD-10-CM | POA: Diagnosis present

## 2022-04-01 DIAGNOSIS — M109 Gout, unspecified: Secondary | ICD-10-CM | POA: Diagnosis present

## 2022-04-01 DIAGNOSIS — Z1152 Encounter for screening for COVID-19: Secondary | ICD-10-CM

## 2022-04-01 DIAGNOSIS — R1313 Dysphagia, pharyngeal phase: Secondary | ICD-10-CM | POA: Diagnosis present

## 2022-04-01 DIAGNOSIS — Z79890 Hormone replacement therapy (postmenopausal): Secondary | ICD-10-CM

## 2022-04-01 DIAGNOSIS — Z681 Body mass index (BMI) 19 or less, adult: Secondary | ICD-10-CM

## 2022-04-01 DIAGNOSIS — E871 Hypo-osmolality and hyponatremia: Secondary | ICD-10-CM | POA: Diagnosis present

## 2022-04-01 DIAGNOSIS — M47812 Spondylosis without myelopathy or radiculopathy, cervical region: Secondary | ICD-10-CM | POA: Diagnosis present

## 2022-04-01 DIAGNOSIS — E039 Hypothyroidism, unspecified: Secondary | ICD-10-CM | POA: Diagnosis present

## 2022-04-01 DIAGNOSIS — M4813 Ankylosing hyperostosis [Forestier], cervicothoracic region: Secondary | ICD-10-CM | POA: Diagnosis present

## 2022-04-01 DIAGNOSIS — R54 Age-related physical debility: Secondary | ICD-10-CM | POA: Diagnosis present

## 2022-04-01 DIAGNOSIS — M2578 Osteophyte, vertebrae: Secondary | ICD-10-CM | POA: Diagnosis present

## 2022-04-01 DIAGNOSIS — Z603 Acculturation difficulty: Secondary | ICD-10-CM | POA: Diagnosis present

## 2022-04-01 DIAGNOSIS — N1831 Chronic kidney disease, stage 3a: Secondary | ICD-10-CM | POA: Diagnosis present

## 2022-04-01 DIAGNOSIS — I10 Essential (primary) hypertension: Secondary | ICD-10-CM | POA: Diagnosis present

## 2022-04-01 DIAGNOSIS — J690 Pneumonitis due to inhalation of food and vomit: Principal | ICD-10-CM | POA: Diagnosis present

## 2022-04-01 LAB — BASIC METABOLIC PANEL
Anion gap: 14 (ref 5–15)
BUN: 70 mg/dL — ABNORMAL HIGH (ref 8–23)
CO2: 22 mmol/L (ref 22–32)
Calcium: 9.9 mg/dL (ref 8.9–10.3)
Chloride: 93 mmol/L — ABNORMAL LOW (ref 98–111)
Creatinine, Ser: 2.15 mg/dL — ABNORMAL HIGH (ref 0.61–1.24)
GFR, Estimated: 29 mL/min — ABNORMAL LOW (ref >60)
Glucose, Bld: 102 mg/dL — ABNORMAL HIGH (ref 70–99)
Potassium: 3.9 mmol/L (ref 3.5–5.1)
Sodium: 129 mmol/L — ABNORMAL LOW (ref 135–145)

## 2022-04-01 LAB — CBC WITH DIFFERENTIAL/PLATELET
Abs Immature Granulocytes: 0.02 10*3/uL (ref 0.00–0.07)
Basophils Absolute: 0 10*3/uL (ref 0.0–0.1)
Basophils Relative: 0 %
Eosinophils Absolute: 0 10*3/uL (ref 0.0–0.5)
Eosinophils Relative: 0 %
HCT: 31.4 % — ABNORMAL LOW (ref 39.0–52.0)
Hemoglobin: 10.7 g/dL — ABNORMAL LOW (ref 13.0–17.0)
Immature Granulocytes: 0 %
Lymphocytes Relative: 3 %
Lymphs Abs: 0.2 10*3/uL — ABNORMAL LOW (ref 0.7–4.0)
MCH: 28.5 pg (ref 26.0–34.0)
MCHC: 34.1 g/dL (ref 30.0–36.0)
MCV: 83.5 fL (ref 80.0–100.0)
Monocytes Absolute: 0.2 10*3/uL (ref 0.1–1.0)
Monocytes Relative: 3 %
Neutro Abs: 5.8 10*3/uL (ref 1.7–7.7)
Neutrophils Relative %: 94 %
Platelets: 316 10*3/uL (ref 150–400)
RBC: 3.76 MIL/uL — ABNORMAL LOW (ref 4.22–5.81)
RDW: 14.6 % (ref 11.5–15.5)
WBC: 6.2 10*3/uL (ref 4.0–10.5)
nRBC: 0 % (ref 0.0–0.2)

## 2022-04-01 LAB — TSH: TSH: 19.225 u[IU]/mL — ABNORMAL HIGH (ref 0.350–4.500)

## 2022-04-01 LAB — LACTIC ACID, PLASMA
Lactic Acid, Venous: 1.3 mmol/L (ref 0.5–1.9)
Lactic Acid, Venous: 1.1 mmol/L (ref 0.5–1.9)

## 2022-04-01 LAB — RESP PANEL BY RT-PCR (FLU A&B, COVID) ARPGX2
Influenza A by PCR: NEGATIVE
Influenza B by PCR: NEGATIVE
SARS Coronavirus 2 by RT PCR: NEGATIVE

## 2022-04-01 LAB — TROPONIN I (HIGH SENSITIVITY)
Troponin I (High Sensitivity): 87 ng/L — ABNORMAL HIGH (ref <18)
Troponin I (High Sensitivity): 84 ng/L — ABNORMAL HIGH (ref <18)

## 2022-04-01 MED ORDER — SODIUM CHLORIDE 0.9 % IV SOLN
500.0000 mg | Freq: Once | INTRAVENOUS | Status: AC
  Administered 2022-04-01: 500 mg via INTRAVENOUS
  Filled 2022-04-01: qty 5

## 2022-04-01 MED ORDER — ASPIRIN 81 MG PO CHEW
324.0000 mg | CHEWABLE_TABLET | Freq: Once | ORAL | Status: AC
  Administered 2022-04-01: 324 mg via ORAL
  Filled 2022-04-01: qty 4

## 2022-04-01 MED ORDER — SODIUM CHLORIDE 0.9 % IV SOLN
1.0000 g | Freq: Once | INTRAVENOUS | Status: AC
  Administered 2022-04-01: 1 g via INTRAVENOUS
  Filled 2022-04-01: qty 10

## 2022-04-01 MED ORDER — LACTATED RINGERS IV BOLUS
1000.0000 mL | Freq: Once | INTRAVENOUS | Status: AC
  Administered 2022-04-01: 1000 mL via INTRAVENOUS

## 2022-04-01 MED ORDER — LACTATED RINGERS IV SOLN: INTRAVENOUS | Status: DC

## 2022-04-01 NOTE — Sepsis Progress Note (Signed)
Elink Monitoring Code Sepsis  

## 2022-04-01 NOTE — ED Triage Notes (Signed)
Patient here POV from Home.  Family endorses Patient has had Dysphagia for approximately 1-2 Months that has been worsening. Family states he has also been having SOB related to Symptoms.    Seen at another ED Yesterday and had Lab Specimens collected but LWBS due to Wait.   NAD Noted during Triage. A&Ox4. GCS 15. BIB Wheelchair.

## 2022-04-01 NOTE — ED Provider Notes (Signed)
[place] EMERGENCY DEPT Provider Note   CSN: [id number] Arrival date & time: 04/01/22  1935     History  Chief Complaint  Patient presents with   Dysphagia    [name] [name] is a 88 y.o. male.  Patient as above with significant medical history as below, including bpH, eczema, hypertension, hypothyroidism who presents to the ED with complaint of dysphagia, cough, congestion, postnasal drip, fever, chills, body aches, dyspnea.  Companied by daughter at bedside reports patient has been feeling unwell for proxy 1 month.  He has chronic dysphagia but is worsened over the past month.  Been coughing for 1 week, productive with green/yellow sputum.  Body aches, chills, subjective fevers.  Poor p.o. intake.  Dyspnea.  He reported to ER last night but wait was too long so he left and came back to the     Past Medical History:  Diagnosis Date   Back pain 10/2008   per MRI 11/06/2008 - severe spinal stenosis worse at L4-5, L3-4, with neural compression at those levels   BPH (benign prostatic hyperplasia)    Eczema    Gout    Hypertension    Hypothyroidism     Past Surgical History:  Procedure Laterality Date   LAMINECTOMY  12/11/2008    Bilateral L2, L3, and L4 laminotomies, done by Dr. Arnoldo Morale     The history is provided by the patient and a relative. No language interpreter was used (prefers to have daughter translate for her, interpreter was offered).       Home Medications Prior to Admission medications   Medication Sig Start Date End Date Taking? Authorizing Provider  allopurinol (ZYLOPRIM) 100 MG tablet Take 1 tablet (100 mg total) by mouth once daily. 01/07/22   Ladell Pier, MD  aspirin EC 81 MG tablet Take 1 tablet (81 mg total) by mouth daily. Swallow whole. Patient not taking: Reported on 12/21/2021 12/07/21   Mayers, Cari S, PA-C  bisoprolol (ZEBETA) 5 MG tablet Take 1 tablet (5 mg total) by mouth once daily. 03/08/22   Ladell Pier, MD   cetirizine (ZYRTEC) 10 MG tablet Take 1 tablet (10 mg total) by mouth daily. 03/10/22   Charlott Rakes, MD  doxazosin (CARDURA) 4 MG tablet Take 1 tablet (4 mg total) by mouth once nightly at bedtime. 01/07/22   Ladell Pier, MD  fluticasone (FLONASE) 50 MCG/ACT nasal spray Place 2 sprays into both nostrils daily. 03/10/22   Charlott Rakes, MD  furosemide (LASIX) 20 MG tablet Take 1 tablet (20 mg total) by mouth once daily. 01/07/22   Ladell Pier, MD  Iron, Ferrous Sulfate, 325 (65 Fe) MG TABS Take 1 tablet (325 mg) by mouth every other day. 12/09/21   Mayers, Cari S, PA-C  levothyroxine (SYNTHROID) 50 MCG tablet Take 1 tablet (50 mcg total) by mouth once daily before breakfast. 01/07/22   Ladell Pier, MD  triamcinolone cream (KENALOG) 0.1 % Apply 1 application topically to the affected area(s) 2 (two) times daily. 12/07/21   Mayers, Cari S, PA-C      Allergies    Patient has no known allergies.    Review of Systems   Review of Systems  Constitutional:  Positive for appetite change, chills, fatigue and fever.  HENT:  Positive for trouble swallowing. Negative for facial swelling.   Eyes:  Negative for photophobia and visual disturbance.  Respiratory:  Positive for cough and shortness of breath.   Cardiovascular:  Negative for chest pain and  palpitations.  Gastrointestinal:  Positive for nausea. Negative for abdominal pain and vomiting.  Endocrine: Negative for polydipsia and polyuria.  Genitourinary:  Negative for difficulty urinating and hematuria.  Musculoskeletal:  Positive for arthralgias. Negative for gait problem and joint swelling.  Skin:  Negative for pallor and rash.  Neurological:  Negative for syncope and headaches.  Psychiatric/Behavioral:  Negative for agitation and confusion.     Physical Exam Updated Vital Signs BP (!) 118/53   Pulse (!) 110   Temp 98.3 F (36.8 C) (Oral)   Resp 18   Ht 6' (1.829 m)   Wt 60.2 kg   SpO2 96%   BMI 18.00 kg/m   Physical Exam Vitals and nursing note reviewed.  Constitutional:      General: He is not in acute distress.    Appearance: He is well-developed. He is not ill-appearing.     Comments: frail  HENT:     Head: Normocephalic and atraumatic.     Jaw: There is normal jaw occlusion. No trismus.     Right Ear: External ear normal.     Left Ear: External ear normal.     Mouth/Throat:     Mouth: Mucous membranes are moist.  Eyes:     General: No scleral icterus.    Extraocular Movements: Extraocular movements intact.     Pupils: Pupils are equal, round, and reactive to light.  Cardiovascular:     Rate and Rhythm: Normal rate and regular rhythm.     Pulses: Normal pulses.     Heart sounds: Normal heart sounds.     No S3 or S4 sounds.  Pulmonary:     Effort: Pulmonary effort is normal. Tachypnea present. No accessory muscle usage or respiratory distress.     Breath sounds: Normal breath sounds.     Comments: Coarse breath sounds b/l Abdominal:     General: Abdomen is flat.     Palpations: Abdomen is soft.     Tenderness: There is no abdominal tenderness.  Musculoskeletal:        General: Normal range of motion.     Cervical back: Normal range of motion.     Right lower leg: No edema.     Left lower leg: No edema.  Skin:    General: Skin is warm and dry.     Capillary Refill: Capillary refill takes less than 2 seconds.  Neurological:     Mental Status: He is alert and oriented to person, place, and time.     Cranial Nerves: Cranial nerves 2-12 are intact.     Sensory: Sensation is intact.     Motor: Motor function is intact.     Coordination: Coordination is intact.  Psychiatric:        Mood and Affect: Mood normal.        Behavior: Behavior normal.     ED Results / Procedures / Treatments   Labs (all labs ordered are listed, but only abnormal results are displayed) Labs Reviewed  CBC WITH DIFFERENTIAL/PLATELET - Abnormal; Notable for the following components:      Result  Value   RBC 3.76 (*)    Hemoglobin 10.7 (*)    HCT 31.4 (*)    Lymphs Abs 0.2 (*)    All other components within normal limits  BASIC METABOLIC PANEL - Abnormal; Notable for the following components:   Sodium 129 (*)    Chloride 93 (*)    Glucose, Bld 102 (*)    BUN 70 (*)  Creatinine, Ser 2.15 (*)    GFR, Estimated 29 (*)    All other components within normal limits  TSH - Abnormal; Notable for the following components:   TSH 19.225 (*)    All other components within normal limits  TROPONIN I (HIGH SENSITIVITY) - Abnormal; Notable for the following components:   Troponin I (High Sensitivity) 87 (*)    All other components within normal limits  TROPONIN I (HIGH SENSITIVITY) - Abnormal; Notable for the following components:   Troponin I (High Sensitivity) 84 (*)    All other components within normal limits  RESP PANEL BY RT-PCR (FLU A&B, COVID) ARPGX2  CULTURE, BLOOD (ROUTINE X 2)  CULTURE, BLOOD (ROUTINE X 2)  LACTIC ACID, PLASMA  LACTIC ACID, PLASMA  URINALYSIS, ROUTINE W REFLEX MICROSCOPIC  T4, FREE  T3, FREE    EKG None  Radiology CT Soft Tissue Neck Wo Contrast  Result Date: 04/01/2022 CLINICAL DATA:  Initial evaluation for hoarseness, dysphagia. EXAM: CT NECK WITHOUT CONTRAST TECHNIQUE: Multidetector CT imaging of the neck was performed following the standard protocol without intravenous contrast. RADIATION DOSE REDUCTION: This exam was performed according to the departmental dose-optimization program which includes automated exposure control, adjustment of the mA and/or kV according to patient size and/or use of iterative reconstruction technique. COMPARISON:  None Available. FINDINGS: Pharynx and larynx: Oral cavity within normal limits. No visible acute inflammatory changes about the teeth. Oropharynx and nasopharynx within normal limits. No retropharyngeal collection or swelling. Negative epiglottis. Vallecula clear. Hypopharynx and supraglottic larynx within normal  limits. Glottis symmetric without visible abnormality. Subglottic airway patent clear. Visualized upper esophagus within normal limits. Salivary glands: Salivary glands including the parotid and submandibular glands are grossly within normal limits. Thyroid: Thyroid within normal limits. Lymph nodes: No visible enlarged or pathologic adenopathy on this noncontrast examination. Vascular: Scattered atheromatous change about the aortic arch, carotid bifurcations, and skull base. Limited intracranial: Unremarkable. Visualized orbits: Prior bilateral ocular lens replacement. Remote posttraumatic defect noted at the left lamina papyracea. Otherwise unremarkable. Mastoids and visualized paranasal sinuses: Mild chronic mucoperiosteal thickening noted about the ethmoidal air cells and maxillary sinuses. Trace right mastoid effusion noted, of doubtful significance. Middle ear cavities are well pneumatized and free of fluid. 1.4 cm sclerotic lesion at the anterior aspect of the left maxillary sinus (series 3, image 15), indeterminate, but likely benign. Skeleton: No other discrete or worrisome osseous lesions. Extensive anterior bulky endplate osteophytic spurring seen throughout the visualized cervicothoracic spine, suggesting dish. Mild chronic compression deformities noted at the superior endplates of T3 and T4. Osteoarthritic changes noted about the partially visualized shoulders. Upper chest: Mild patchy ground-glass opacity noted within the visualized left upper and lower lobes, likely mild infection/pneumonitis. Visualized upper chest demonstrates no other acute finding. Other: None. IMPRESSION: 1. Mild patchy ground-glass opacity within the visualized left upper and lower lobes, likely mild infection/pneumonitis. 2. Extensive anterior bulky endplate osteophytic spurring throughout the visualized cervicothoracic spine, consistent with DISH. These changes can sometimes be associated with dysphagia. 3. No other acute  abnormality within the neck. Aortic Atherosclerosis (ICD10-I70.0). Electronically Signed   By: Jeannine Boga M.D.   On: 04/01/2022 23:24   CT Head Wo Contrast  Result Date: 04/01/2022 CLINICAL DATA:  Dysphagia for 2 months.  Shortness of breath. EXAM: CT HEAD WITHOUT CONTRAST TECHNIQUE: Contiguous axial images were obtained from the base of the skull through the vertex without intravenous contrast. RADIATION DOSE REDUCTION: This exam was performed according to the departmental dose-optimization program which includes  automated exposure control, adjustment of the mA and/or kV according to patient size and/or use of iterative reconstruction technique. COMPARISON:  12/03/2021 FINDINGS: Brain: Diffuse cerebral atrophy. Ventricular dilatation consistent with central atrophy. Cavum septum pellucidum. Low-attenuation changes in the deep white matter consistent with small vessel ischemia. No abnormal extra-axial fluid collections. No mass effect or midline shift. Patrice Matthew-white matter junctions are distinct. Basal cisterns are not effaced. No acute intracranial hemorrhage. Vascular: Intracranial arterial vascular calcifications. Skull: Normal. Negative for fracture or focal lesion. Sinuses/Orbits: No acute finding. Other: None. IMPRESSION: No acute intracranial abnormalities. Chronic atrophy and small vessel ischemic changes. Electronically Signed   By: Lucienne Capers M.D.   On: 04/01/2022 23:07   DG Chest 2 View  Result Date: 04/01/2022 CLINICAL DATA:  Shortness of breath.  Dysphagia for 2 months. EXAM: CHEST - 2 VIEW COMPARISON:  03/06/2022 FINDINGS: Heart size and pulmonary vascularity are normal. Emphysematous changes in the lungs. New infiltrates in the lung bases may indicate pneumonia or aspiration. No pleural effusions. No pneumothorax. Mediastinal contours appear intact. Severe degenerative changes in the right shoulder. Previous resection or resorption of the left humeral head with remodeling of bone  and degenerative changes in the left shoulder. Degenerative changes also in the spine. IMPRESSION: 1. Patchy infiltrates in the lung bases may be due to pneumonia or aspiration. 2. Emphysematous changes in the lungs. 3. Severe degenerative changes in the visualized skeleton. Previous resection or resorption of the left humeral head. Electronically Signed   By: Lucienne Capers M.D.   On: 04/01/2022 20:50    Procedures .Critical Care  Performed by: Jeanell Sparrow, DO Authorized by: Jeanell Sparrow, DO   Critical care provider statement:    Critical care time (minutes):  55   Critical care time was exclusive of:  Separately billable procedures and treating other patients   Critical care was necessary to treat or prevent imminent or life-threatening deterioration of the following conditions:  Sepsis and cardiac failure   Critical care was time spent personally by me on the following activities:  Development of treatment plan with patient or surrogate, discussions with consultants, evaluation of patient's response to treatment, examination of patient, ordering and review of laboratory studies, ordering and review of radiographic studies, ordering and performing treatments and interventions, pulse oximetry, re-evaluation of patient's condition, review of old charts and obtaining history from patient or surrogate   Care discussed with: admitting provider       Medications Ordered in ED Medications  lactated ringers infusion (has no administration in time range)  lactated ringers bolus 1,000 mL (1,000 mLs Intravenous New Bag/Given 04/01/22 2228)  cefTRIAXone (ROCEPHIN) 1 g in sodium chloride 0.9 % 100 mL IVPB (0 g Intravenous Stopped 04/01/22 2308)  azithromycin (ZITHROMAX) 500 mg in sodium chloride 0.9 % 250 mL IVPB (500 mg Intravenous New Bag/Given 04/01/22 2233)  aspirin chewable tablet 324 mg (324 mg Oral Given 04/01/22 2309)    ED Course/ Medical Decision Making/ A&P Clinical Course as of 04/02/22  0001  Fri Apr 01, 2022  2237 Creatinine(!): 2.15 AKI, baseline around 1.4, CKD [SG]  2346 TSH(!): 19.225 Hx hypothyroid, diff taking his medications last few days 2/2 dysphagia.  [SG]    Clinical Course User Index [SG] Jeanell Sparrow, DO                           Medical Decision Making Amount and/or Complexity of Data Reviewed Labs: ordered. Decision-making details  documented in ED Course. Radiology: ordered.  Risk OTC drugs. Prescription drug management. Decision regarding hospitalization.   This patient presents to the ED with chief complaint(s) of cough, dysphagia, fatigue, fevers, chills, dyspnea with pertinent past medical history of above which further complicates the presenting complaint. The complaint involves an extensive differential diagnosis and also carries with it a high risk of complications and morbidity.     In my evaluation of this patient's symptoms my DDx includes, but is not limited to, pneumonia, pulmonary embolism, pneumothorax, pulmonary edema, metabolic acidosis, asthma, COPD, cardiac cause, anemia, anxiety, COVID, flu, CVA, dysmotility, foreign body, etc. .  . Serious etiologies were considered.   The initial plan is to screening labs/imaging    Additional history obtained: Additional history obtained from family Records reviewed  prior ED visits   Independent labs interpretation:  The following labs were independently interpreted:  BMP with hyponatremia 129, Cr 2.15, appears to be AKI w/ baseline 1.4; poor pO intake last few weeks 2/2 dysphagia Trop elev 87, ECG stable, favor demand ischemia in setting of sepsis, will trend trop CBC last night with leukocytosis, today WBC was WNL, HGB stable TSH was elevated, he has hx hypothyroid on synthroid and bisoprolol; low susp for myxedema coma or thyrotoxicosis given elevated tsh Uremia at 62, baseline around 40 w/ hx ckd  Independent visualization of imaging: - I independently visualized the following  imaging with scope of interpretation limited to determining acute life threatening conditions related to emergency care: CXR, CT H CT soft tissue neck, which revealed PNA, ct soft tissue neck with extensive anterior bulky endplate osteophytic spurring, ?DISH.   Cardiac monitoring was reviewed and interpreted by myself which shows sinus tachycardia  Treatment and Reassessment: Code sepsis Rocephin/azithro LR ASA >> improved  Consultation: - Consulted or discussed management/test interpretation w/ external professional: na  Consideration for admission or further workup: Admission was considered    88 yo arabic speaking male to ED with multiple complaints, found to have sepsis 2/2 pneumonia, ?aspiration, started rocephin/azithro. IVF. He is on ambient air. Recommend admission for sepsis/PNA, his PORT score is 126. On ambient air.   May benefit from GI eval or swallow study give progressive dysphagia.  Spoke with hospitalist who accepts pt for admission.   Social Determinants of health: Social History   Tobacco Use   Smoking status: Never   Smokeless tobacco: Never  Vaping Use   Vaping Use: Never used  Substance Use Topics   Alcohol use: No   Drug use: No           Final Clinical Impression(s) / ED Diagnoses Final diagnoses:  Community acquired pneumonia, unspecified laterality  Sepsis, due to unspecified organism, unspecified whether acute organ dysfunction present ([place])  AKI (acute kidney injury) ([place])  Hypothyroidism, unspecified type    Rx / DC Orders ED Discharge Orders     None         Jeanell Sparrow, DO 04/02/22 0001

## 2022-04-02 ENCOUNTER — Encounter (HOSPITAL_COMMUNITY): Payer: Self-pay | Admitting: Family Medicine

## 2022-04-02 DIAGNOSIS — N179 Acute kidney failure, unspecified: Secondary | ICD-10-CM | POA: Diagnosis present

## 2022-04-02 DIAGNOSIS — J690 Pneumonitis due to inhalation of food and vomit: Secondary | ICD-10-CM

## 2022-04-02 DIAGNOSIS — I10 Essential (primary) hypertension: Secondary | ICD-10-CM

## 2022-04-02 DIAGNOSIS — R7989 Other specified abnormal findings of blood chemistry: Secondary | ICD-10-CM

## 2022-04-02 DIAGNOSIS — N1831 Chronic kidney disease, stage 3a: Secondary | ICD-10-CM

## 2022-04-02 DIAGNOSIS — E871 Hypo-osmolality and hyponatremia: Secondary | ICD-10-CM

## 2022-04-02 DIAGNOSIS — E039 Hypothyroidism, unspecified: Secondary | ICD-10-CM

## 2022-04-02 DIAGNOSIS — R1310 Dysphagia, unspecified: Secondary | ICD-10-CM

## 2022-04-02 LAB — URINALYSIS, COMPLETE (UACMP) WITH MICROSCOPIC
Bilirubin Urine: NEGATIVE
Glucose, UA: NEGATIVE mg/dL
Ketones, ur: NEGATIVE mg/dL
Nitrite: NEGATIVE
Protein, ur: NEGATIVE mg/dL
Specific Gravity, Urine: 1.011 (ref 1.005–1.030)
WBC, UA: >50 WBC/hpf — ABNORMAL HIGH (ref 0–5)
pH: 5 (ref 5.0–8.0)

## 2022-04-02 LAB — CREATININE, URINE, RANDOM: Creatinine, Urine: 81 mg/dL

## 2022-04-02 LAB — EXPECTORATED SPUTUM ASSESSMENT W GRAM STAIN, RFLX TO RESP C

## 2022-04-02 LAB — PROCALCITONIN: Procalcitonin: 7.37 ng/mL

## 2022-04-02 LAB — BASIC METABOLIC PANEL
Anion gap: 11 (ref 5–15)
BUN: 63 mg/dL — ABNORMAL HIGH (ref 8–23)
CO2: 22 mmol/L (ref 22–32)
Calcium: 9 mg/dL (ref 8.9–10.3)
Chloride: 99 mmol/L (ref 98–111)
Creatinine, Ser: 1.96 mg/dL — ABNORMAL HIGH (ref 0.61–1.24)
GFR, Estimated: 33 mL/min — ABNORMAL LOW (ref >60)
Glucose, Bld: 83 mg/dL (ref 70–99)
Potassium: 3.8 mmol/L (ref 3.5–5.1)
Sodium: 132 mmol/L — ABNORMAL LOW (ref 135–145)

## 2022-04-02 LAB — SODIUM, URINE, RANDOM: Sodium, Ur: <10 mmol/L

## 2022-04-02 LAB — STREP PNEUMONIAE URINARY ANTIGEN: Strep Pneumo Urinary Antigen: POSITIVE — AB

## 2022-04-02 MED ORDER — LACTATED RINGERS IV SOLN: INTRAVENOUS | Status: AC

## 2022-04-02 MED ORDER — LEVOTHYROXINE SODIUM 100 MCG/5ML IV SOLN
37.5000 ug | Freq: Every day | INTRAVENOUS | Status: DC
  Administered 2022-04-02 – 2022-04-03 (×2): 37.5 ug via INTRAVENOUS
  Filled 2022-04-02 (×2): qty 5

## 2022-04-02 MED ORDER — SODIUM CHLORIDE 0.9 % IV SOLN
3.0000 g | Freq: Two times a day (BID) | INTRAVENOUS | Status: DC
  Administered 2022-04-02 – 2022-04-04 (×5): 3 g via INTRAVENOUS
  Filled 2022-04-02 (×5): qty 8

## 2022-04-02 MED ORDER — LABETALOL HCL 5 MG/ML IV SOLN: 10.0000 mg | INTRAVENOUS | Status: DC | PRN

## 2022-04-02 MED ORDER — ONDANSETRON HCL 4 MG/2ML IJ SOLN: 4.0000 mg | Freq: Four times a day (QID) | INTRAMUSCULAR | Status: DC | PRN

## 2022-04-02 MED ORDER — HEPARIN SODIUM (PORCINE) 5000 UNIT/ML IJ SOLN
5000.0000 [IU] | Freq: Three times a day (TID) | INTRAMUSCULAR | Status: DC
  Administered 2022-04-02 – 2022-04-09 (×21): 5000 [IU] via SUBCUTANEOUS
  Filled 2022-04-02 (×21): qty 1

## 2022-04-02 MED ORDER — LACTATED RINGERS IV SOLN: INTRAVENOUS | Status: DC

## 2022-04-02 MED ORDER — ACETAMINOPHEN 650 MG RE SUPP
650.0000 mg | Freq: Four times a day (QID) | RECTAL | Status: DC | PRN
  Administered 2022-04-06: 650 mg via RECTAL
  Filled 2022-04-02: qty 1

## 2022-04-02 MED ORDER — ONDANSETRON HCL 4 MG PO TABS: 4.0000 mg | ORAL_TABLET | Freq: Four times a day (QID) | ORAL | Status: DC | PRN

## 2022-04-02 MED ORDER — ACETAMINOPHEN 325 MG PO TABS
650.0000 mg | ORAL_TABLET | Freq: Four times a day (QID) | ORAL | Status: DC | PRN
  Administered 2022-04-05: 650 mg via ORAL
  Filled 2022-04-02: qty 2

## 2022-04-02 MED ORDER — SODIUM CHLORIDE 0.9% FLUSH
3.0000 mL | Freq: Two times a day (BID) | INTRAVENOUS | Status: DC
  Administered 2022-04-02 – 2022-04-07 (×9): 3 mL via INTRAVENOUS

## 2022-04-02 NOTE — H&P (Signed)
History and Physical    Resean Brander WCH:[id number] DOB: March 11, 1934 DOA: 04/01/2022  PCP: Marcine Matar, MD   Patient coming from: Home   Chief Complaint: Difficulty swallowing, SOB, cough   HPI: Nollie Terlizzi is a pleasant 88 y.o. male with medical history significant for hypertension, hypothyroidism, and BPH who presents to the emergency department with dysphagia, shortness of breath, and cough.    Patient has had worsening dysphagia to both solids and liquids over the past couple months, has had frequent cough over the same interval, and has developed progressive shortness of breath and worsening productive cough over the past few days.    He has not been eating or drinking much of anything in the past few days due to this, and has been unable to take his medications.  He denies any new focal numbness or weakness, and denies any chest pain.  [facility] ED Course: Upon arrival to the ED, patient is found to be afebrile and saturating well on room air with tachypnea and tachycardia.  EKG features sinus tachycardia with rate 131 and RBBB.  Chest x-ray notable for patchy infiltrates in the bases and emphysematous changes.  CT head is negative for acute intracranial abnormality.  CT of the cervical spine demonstrates extensive anterior bulky endplate osteophytic spurring throughout the visualized spine.  Chemistry panel notable for sodium 129, BUN 70, and creatinine 2.15.  CBC with stable normocytic anemia.  TSH is elevated to 19.225.  Lactic acid is reassuringly normal.  Troponin was 87 and then 84.  COVID and influenza PCR are negative.    Blood cultures were collected and the patient was given a liter of LR, Reglan, azithromycin, and aspirin in the ED.  He was transferred to [facility] for admission.  Review of Systems:  All other systems reviewed and apart from HPI, are negative.  Past Medical History:  Diagnosis Date   Back pain 10/2008   per MRI 11/06/2008 - severe  spinal stenosis worse at L4-5, L3-4, with neural compression at those levels   BPH (benign prostatic hyperplasia)    Eczema    Gout    Hypertension    Hypothyroidism     Past Surgical History:  Procedure Laterality Date   LAMINECTOMY  12/11/2008    Bilateral L2, L3, and L4 laminotomies, done by Dr. Lovell Sheehan    Social History:   reports that he has never smoked. He has never used smokeless tobacco. He reports that he does not drink alcohol and does not use drugs.  No Known Allergies  Family History  Problem Relation Age of Onset   Stroke Neg Hx    Cancer Neg Hx      Prior to Admission medications   Medication Sig Start Date End Date Taking? Authorizing Provider  allopurinol (ZYLOPRIM) 100 MG tablet Take 1 tablet (100 mg total) by mouth once daily. 01/07/22   Marcine Matar, MD  aspirin EC 81 MG tablet Take 1 tablet (81 mg total) by mouth daily. Swallow whole. Patient not taking: Reported on 12/21/2021 12/07/21   Mayers, Cari S, PA-C  bisoprolol (ZEBETA) 5 MG tablet Take 1 tablet (5 mg total) by mouth once daily. 03/08/22   Marcine Matar, MD  cetirizine (ZYRTEC) 10 MG tablet Take 1 tablet (10 mg total) by mouth daily. 03/10/22   Hoy Register, MD  doxazosin (CARDURA) 4 MG tablet Take 1 tablet (4 mg total) by mouth once nightly at bedtime. 01/07/22   Marcine Matar, MD  fluticasone (FLONASE) 50 MCG/ACT nasal spray Place 2 sprays into both nostrils daily. 03/10/22   Hoy Register, MD  furosemide (LASIX) 20 MG tablet Take 1 tablet (20 mg total) by mouth once daily. 01/07/22   Marcine Matar, MD  Iron, Ferrous Sulfate, 325 (65 Fe) MG TABS Take 1 tablet (325 mg) by mouth every other day. 12/09/21   Mayers, Cari S, PA-C  levothyroxine (SYNTHROID) 50 MCG tablet Take 1 tablet (50 mcg total) by mouth once daily before breakfast. 01/07/22   Marcine Matar, MD  triamcinolone cream (KENALOG) 0.1 % Apply 1 application topically to the affected area(s) 2 (two) times daily. 12/07/21    Mayers, Kasandra Knudsen, PA-C    Physical Exam: Vitals:   04/02/22 0100 04/02/22 0200 04/02/22 0300 04/02/22 0441  BP: (!) 126/56 (!) 110/54 124/60 (!) 124/59  Pulse: 89 88 96 84  Resp: (!) 22 19 20 17   Temp: 98.4 F (36.9 C)   98.2 F (36.8 C)  TempSrc: Oral   Oral  SpO2: 96% 97% 100% 98%  Weight:    58.9 kg  Height:        Constitutional: NAD, calm  Eyes: PERTLA, lids and conjunctivae normal ENMT: Mucous membranes are moist. Posterior pharynx clear of any exudate or lesions.   Neck: supple, no masses  Respiratory: coarse rales b/l, no wheezing. No accessory muscle use.  Cardiovascular: S1 & S2 heard, regular rate and rhythm. No extremity edema.  Abdomen: No distension, no tenderness, soft. Bowel sounds active.  Musculoskeletal: no clubbing / cyanosis. No joint deformity upper and lower extremities.   Skin: no significant rashes, lesions, ulcers. Warm, dry, well-perfused. Neurologic: CN 2-12 grossly intact. Moving all extremities. Alert and oriented.  Psychiatric: Pleasant. Cooperative.    Labs and Imaging on Admission: I have personally reviewed following labs and imaging studies  CBC: Recent Labs  Lab 03/31/22 1636 04/01/22 2013  WBC 13.9* 6.2  NEUTROABS 12.3* 5.8  HGB 10.2* 10.7*  HCT 30.5* 31.4*  MCV 86.4 83.5  PLT 288 316   Basic Metabolic Panel: Recent Labs  Lab 03/31/22 1636 04/01/22 2013  NA 129* 129*  K 4.2 3.9  CL 97* 93*  CO2 22 22  GLUCOSE 91 102*  BUN 65* 70*  CREATININE 2.49* 2.15*  CALCIUM 9.2 9.9   GFR: Estimated Creatinine Clearance: 20.5 mL/min (A) (by C-G formula based on SCr of 2.15 mg/dL (H)). Liver Function Tests: Recent Labs  Lab 03/31/22 1636  AST 16  ALT 9  ALKPHOS 52  BILITOT 1.0  PROT 7.1  ALBUMIN 3.1*   No results for input(s): "LIPASE", "AMYLASE" in the last 168 hours. No results for input(s): "AMMONIA" in the last 168 hours. Coagulation Profile: No results for input(s): "INR", "PROTIME" in the last 168 hours. Cardiac  Enzymes: No results for input(s): "CKTOTAL", "CKMB", "CKMBINDEX", "TROPONINI" in the last 168 hours. BNP (last 3 results) No results for input(s): "PROBNP" in the last 8760 hours. HbA1C: No results for input(s): "HGBA1C" in the last 72 hours. CBG: No results for input(s): "GLUCAP" in the last 168 hours. Lipid Profile: No results for input(s): "CHOL", "HDL", "LDLCALC", "TRIG", "CHOLHDL", "LDLDIRECT" in the last 72 hours. Thyroid Function Tests: Recent Labs    04/01/22 2156  TSH 19.225*   Anemia Panel: No results for input(s): "VITAMINB12", "FOLATE", "FERRITIN", "TIBC", "IRON", "RETICCTPCT" in the last 72 hours. Urine analysis:    Component Value Date/Time   COLORURINE YELLOW 12/03/2021 2105   APPEARANCEUR CLEAR 12/03/2021 2105  LABSPEC 1.013 12/03/2021 2105   PHURINE 5.0 12/03/2021 2105   GLUCOSEU NEGATIVE 12/03/2021 2105   HGBUR SMALL (A) 12/03/2021 2105   BILIRUBINUR NEGATIVE 12/03/2021 2105   KETONESUR NEGATIVE 12/03/2021 2105   PROTEINUR NEGATIVE 12/03/2021 2105   UROBILINOGEN 0.2 12/03/2021 1921   NITRITE NEGATIVE 12/03/2021 2105   LEUKOCYTESUR TRACE (A) 12/03/2021 2105   Sepsis Labs: @LABRCNTIP (procalcitonin:4,lacticidven:4) ) Recent Results (from the past 240 hour(s))  Resp Panel by RT-PCR (Flu A&B, Covid) Anterior Nasal Swab     Status: None   Collection Time: 04/01/22  9:56 PM   Specimen: Anterior Nasal Swab  Result Value Ref Range Status   SARS Coronavirus 2 by RT PCR NEGATIVE NEGATIVE Final    Comment: (NOTE) SARS-CoV-2 target nucleic acids are NOT DETECTED.  The SARS-CoV-2 RNA is generally detectable in upper respiratory specimens during the acute phase of infection. The lowest concentration of SARS-CoV-2 viral copies this assay can detect is 138 copies/mL. A negative result does not preclude SARS-Cov-2 infection and should not be used as the sole basis for treatment or other patient management decisions. A negative result may occur with  improper  specimen collection/handling, submission of specimen other than nasopharyngeal swab, presence of viral mutation(s) within the areas targeted by this assay, and inadequate number of viral copies(<138 copies/mL). A negative result must be combined with clinical observations, patient history, and epidemiological information. The expected result is Negative.  Fact Sheet for Patients:  EntrepreneurPulse.com.au  Fact Sheet for Healthcare Providers:  IncredibleEmployment.be  This test is no t yet approved or cleared by the [place] FDA and  has been authorized for detection and/or diagnosis of SARS-CoV-2 by FDA under an Emergency Use Authorization (EUA). This EUA will remain  in effect (meaning this test can be used) for the duration of the COVID-19 declaration under Section 564(b)(1) of the Act, 21 U.S.C.section 360bbb-3(b)(1), unless the authorization is terminated  or revoked sooner.       Influenza A by PCR NEGATIVE NEGATIVE Final   Influenza B by PCR NEGATIVE NEGATIVE Final    Comment: (NOTE) The Xpert Xpress SARS-CoV-2/FLU/RSV plus assay is intended as an aid in the diagnosis of influenza from Nasopharyngeal swab specimens and should not be used as a sole basis for treatment. Nasal washings and aspirates are unacceptable for Xpert Xpress SARS-CoV-2/FLU/RSV testing.  Fact Sheet for Patients: EntrepreneurPulse.com.au  Fact Sheet for Healthcare Providers: IncredibleEmployment.be  This test is not yet approved or cleared by the [place] FDA and has been authorized for detection and/or diagnosis of SARS-CoV-2 by FDA under an Emergency Use Authorization (EUA). This EUA will remain in effect (meaning this test can be used) for the duration of the COVID-19 declaration under Section 564(b)(1) of the Act, 21 U.S.C. section 360bbb-3(b)(1), unless the authorization is terminated or revoked.  Performed at  [facility], [address] [place], [place] [address]      Radiological Exams on Admission: CT Soft Tissue Neck Wo Contrast  Result Date: 04/01/2022 CLINICAL DATA:  Initial evaluation for hoarseness, dysphagia. EXAM: CT NECK WITHOUT CONTRAST TECHNIQUE: Multidetector CT imaging of the neck was performed following the standard protocol without intravenous contrast. RADIATION DOSE REDUCTION: This exam was performed according to the departmental dose-optimization program which includes automated exposure control, adjustment of the mA and/or kV according to patient size and/or use of iterative reconstruction technique. COMPARISON:  None Available. FINDINGS: Pharynx and larynx: Oral cavity within normal limits. No visible acute inflammatory changes about the teeth. Oropharynx and nasopharynx  within normal limits. No retropharyngeal collection or swelling. Negative epiglottis. Vallecula clear. Hypopharynx and supraglottic larynx within normal limits. Glottis symmetric without visible abnormality. Subglottic airway patent clear. Visualized upper esophagus within normal limits. Salivary glands: Salivary glands including the parotid and submandibular glands are grossly within normal limits. Thyroid: Thyroid within normal limits. Lymph nodes: No visible enlarged or pathologic adenopathy on this noncontrast examination. Vascular: Scattered atheromatous change about the aortic arch, carotid bifurcations, and skull base. Limited intracranial: Unremarkable. Visualized orbits: Prior bilateral ocular lens replacement. Remote posttraumatic defect noted at the left lamina papyracea. Otherwise unremarkable. Mastoids and visualized paranasal sinuses: Mild chronic mucoperiosteal thickening noted about the ethmoidal air cells and maxillary sinuses. Trace right mastoid effusion noted, of doubtful significance. Middle ear cavities are well pneumatized and free of fluid. 1.4 cm sclerotic lesion at the anterior  aspect of the left maxillary sinus (series 3, image 15), indeterminate, but likely benign. Skeleton: No other discrete or worrisome osseous lesions. Extensive anterior bulky endplate osteophytic spurring seen throughout the visualized cervicothoracic spine, suggesting dish. Mild chronic compression deformities noted at the superior endplates of T3 and T4. Osteoarthritic changes noted about the partially visualized shoulders. Upper chest: Mild patchy ground-glass opacity noted within the visualized left upper and lower lobes, likely mild infection/pneumonitis. Visualized upper chest demonstrates no other acute finding. Other: None. IMPRESSION: 1. Mild patchy ground-glass opacity within the visualized left upper and lower lobes, likely mild infection/pneumonitis. 2. Extensive anterior bulky endplate osteophytic spurring throughout the visualized cervicothoracic spine, consistent with DISH. These changes can sometimes be associated with dysphagia. 3. No other acute abnormality within the neck. Aortic Atherosclerosis (ICD10-I70.0). Electronically Signed   By: Rise Mu M.D.   On: 04/01/2022 23:24   CT Head Wo Contrast  Result Date: 04/01/2022 CLINICAL DATA:  Dysphagia for 2 months.  Shortness of breath. EXAM: CT HEAD WITHOUT CONTRAST TECHNIQUE: Contiguous axial images were obtained from the base of the skull through the vertex without intravenous contrast. RADIATION DOSE REDUCTION: This exam was performed according to the departmental dose-optimization program which includes automated exposure control, adjustment of the mA and/or kV according to patient size and/or use of iterative reconstruction technique. COMPARISON:  12/03/2021 FINDINGS: Brain: Diffuse cerebral atrophy. Ventricular dilatation consistent with central atrophy. Cavum septum pellucidum. Low-attenuation changes in the deep white matter consistent with small vessel ischemia. No abnormal extra-axial fluid collections. No mass effect or  midline shift. Gray-white matter junctions are distinct. Basal cisterns are not effaced. No acute intracranial hemorrhage. Vascular: Intracranial arterial vascular calcifications. Skull: Normal. Negative for fracture or focal lesion. Sinuses/Orbits: No acute finding. Other: None. IMPRESSION: No acute intracranial abnormalities. Chronic atrophy and small vessel ischemic changes. Electronically Signed   By: Burman Nieves M.D.   On: 04/01/2022 23:07   DG Chest 2 View  Result Date: 04/01/2022 CLINICAL DATA:  Shortness of breath.  Dysphagia for 2 months. EXAM: CHEST - 2 VIEW COMPARISON:  03/06/2022 FINDINGS: Heart size and pulmonary vascularity are normal. Emphysematous changes in the lungs. New infiltrates in the lung bases may indicate pneumonia or aspiration. No pleural effusions. No pneumothorax. Mediastinal contours appear intact. Severe degenerative changes in the right shoulder. Previous resection or resorption of the left humeral head with remodeling of bone and degenerative changes in the left shoulder. Degenerative changes also in the spine. IMPRESSION: 1. Patchy infiltrates in the lung bases may be due to pneumonia or aspiration. 2. Emphysematous changes in the lungs. 3. Severe degenerative changes in the visualized skeleton. Previous resection or resorption  of the left humeral head. Electronically Signed   By: Burman Nieves M.D.   On: 04/01/2022 20:50    EKG: Independently reviewed. Sinus tachycardia, rate 131, RBBB.   Assessment/Plan   1. Aspiration pneumonia  - Presents with worsening cough and SOB in setting of worsening dysphagia to solids and liquids  - Culture sputum, continue antibiotics, keep NPO pending SLP eval    2. AKI superimposed on CKD IIIa  - SCr is 2.15 on admission, up from 1.45 two weeks ago  - Likely acute prerenal azotemia in setting of hypovolemia  - Continue IVF hydration, renally-dose medications, repeat chem panel   3. Hyponatremia  - Serum sodium is 129 in  setting of hypovolemia  - Continue isotonic IVF and repeat chem panel    4. Elevated troponin  - Troponin mildly elevated and flat with no anginal complaints, low suspicion for ACS  - Treat underlying pneumonia and AKI   5. Hypertension  - Treat as needed only for now   6. Hypothyroidism  - TSH is 19.225 in ED  - He has been unable to take his Synthroid d/t dysphagia, will use IV for now    DVT prophylaxis: sq heparin  Code Status: Full  Level of Care: Level of care: Progressive Family Communication: Son at bedside  Disposition Plan:  Patient is from: home  Anticipated d/c is to: TBD Anticipated d/c date is: 04/05/22  Patient currently: Pending SLP eval, transition to oral medications, improved renal function  Consults called: none  Admission status: Inpatient     Briscoe Deutscher, MD [facility]  04/02/2022, 5:59 AM

## 2022-04-02 NOTE — H&P (View-Only) (Signed)
CONSULT NOTE FOR [facility]  Reason for Consult: Dysphagia Referring Physician: [facility]  [facility] HPI: This is an 88 year old male with a PMH of HTN, gout, hypothyriodism, and back pain admitted for complaints of dysphagia and SOB.  The patient's daughter reports that he suffered with progressive dysphagia over the past month.  It was to both solids and liquids.  As a result there is a noticeable weight loss, but it was not quantified.  There was an associated SOB and cough and imaging of his chest and neck showed that there is extensive anterior bulky endplat osteophytic spurring consistent with DISH.  There was no prior problem with dysphagia before this past month.  Speech Pathology evaluated the patient and the plan is for an MBS.  Currently he is on thick liquids.  Past Medical History:  Diagnosis Date   Back pain 10/2008   per MRI 11/06/2008 - severe spinal stenosis worse at L4-5, L3-4, with neural compression at those levels   BPH (benign prostatic hyperplasia)    Eczema    Gout    Hypertension    Hypothyroidism     Past Surgical History:  Procedure Laterality Date   LAMINECTOMY  12/11/2008    Bilateral L2, L3, and L4 laminotomies, done by Dr. Lovell Sheehan    Family History  Problem Relation Age of Onset   Stroke Neg Hx    Cancer Neg Hx     Social History:  reports that he has never smoked. He has never used smokeless tobacco. He reports that he does not drink alcohol and does not use drugs.  Allergies: No Known Allergies  Medications: Scheduled:  heparin  5,000 Units Subcutaneous Q8H   levothyroxine  37.5 mcg Intravenous Daily   sodium chloride flush  3 mL Intravenous Q12H   Continuous:  ampicillin-sulbactam (UNASYN) IV Stopped (04/02/22 0707)   lactated ringers 100 mL/hr at 04/02/22 1200    Results for orders placed or performed during the hospital encounter of 04/01/22 (from the past 24 hour(s))  Troponin I (High Sensitivity)     Status: Abnormal    Collection Time: 04/01/22  8:13 PM  Result Value Ref Range   Troponin I (High Sensitivity) 87 (H) <18 ng/L  CBC with Differential     Status: Abnormal   Collection Time: 04/01/22  8:13 PM  Result Value Ref Range   WBC 6.2 4.0 - 10.5 K/uL   RBC 3.76 (L) 4.22 - 5.81 MIL/uL   Hemoglobin 10.7 (L) 13.0 - 17.0 g/dL   HCT 15.4 (L) 00.8 - 67.6 %   MCV 83.5 80.0 - 100.0 fL   MCH 28.5 26.0 - 34.0 pg   MCHC 34.1 30.0 - 36.0 g/dL   RDW 19.5 09.3 - 26.7 %   Platelets 316 150 - 400 K/uL   nRBC 0.0 0.0 - 0.2 %   Neutrophils Relative % 94 %   Neutro Abs 5.8 1.7 - 7.7 K/uL   Lymphocytes Relative 3 %   Lymphs Abs 0.2 (L) 0.7 - 4.0 K/uL   Monocytes Relative 3 %   Monocytes Absolute 0.2 0.1 - 1.0 K/uL   Eosinophils Relative 0 %   Eosinophils Absolute 0.0 0.0 - 0.5 K/uL   Basophils Relative 0 %   Basophils Absolute 0.0 0.0 - 0.1 K/uL   Immature Granulocytes 0 %   Abs Immature Granulocytes 0.02 0.00 - 0.07 K/uL  Basic metabolic panel     Status: Abnormal   Collection Time: 04/01/22  8:13 PM  Result Value Ref Range   Sodium 129 (L) 135 - 145 mmol/L   Potassium 3.9 3.5 - 5.1 mmol/L   Chloride 93 (L) 98 - 111 mmol/L   CO2 22 22 - 32 mmol/L   Glucose, Bld 102 (H) 70 - 99 mg/dL   BUN 70 (H) 8 - 23 mg/dL   Creatinine, Ser 2.15 (H) 0.61 - 1.24 mg/dL   Calcium 9.9 8.9 - 10.3 mg/dL   GFR, Estimated 29 (L) >60 mL/min   Anion gap 14 5 - 15  Resp Panel by RT-PCR (Flu A&B, Covid) Anterior Nasal Swab     Status: None   Collection Time: 04/01/22  9:56 PM   Specimen: Anterior Nasal Swab  Result Value Ref Range   SARS Coronavirus 2 by RT PCR NEGATIVE NEGATIVE   Influenza A by PCR NEGATIVE NEGATIVE   Influenza B by PCR NEGATIVE NEGATIVE  Lactic acid, plasma     Status: None   Collection Time: 04/01/22  9:56 PM  Result Value Ref Range   Lactic Acid, Venous 1.3 0.5 - 1.9 mmol/L  TSH     Status: Abnormal   Collection Time: 04/01/22  9:56 PM  Result Value Ref Range   TSH 19.225 (H) 0.350 - 4.500 uIU/mL   Troponin I (High Sensitivity)     Status: Abnormal   Collection Time: 04/01/22 11:05 PM  Result Value Ref Range   Troponin I (High Sensitivity) 84 (H) <18 ng/L  Lactic acid, plasma     Status: None   Collection Time: 04/01/22 11:05 PM  Result Value Ref Range   Lactic Acid, Venous 1.1 0.5 - 1.9 mmol/L  Basic metabolic panel     Status: Abnormal   Collection Time: 04/02/22  5:38 AM  Result Value Ref Range   Sodium 132 (L) 135 - 145 mmol/L   Potassium 3.8 3.5 - 5.1 mmol/L   Chloride 99 98 - 111 mmol/L   CO2 22 22 - 32 mmol/L   Glucose, Bld 83 70 - 99 mg/dL   BUN 63 (H) 8 - 23 mg/dL   Creatinine, Ser 1.96 (H) 0.61 - 1.24 mg/dL   Calcium 9.0 8.9 - 10.3 mg/dL   GFR, Estimated 33 (L) >60 mL/min   Anion gap 11 5 - 15  Procalcitonin - Baseline     Status: None   Collection Time: 04/02/22  5:38 AM  Result Value Ref Range   Procalcitonin 7.37 ng/mL  Urinalysis, Complete w Microscopic     Status: Abnormal   Collection Time: 04/02/22  6:00 AM  Result Value Ref Range   Color, Urine YELLOW YELLOW   APPearance HAZY (A) CLEAR   Specific Gravity, Urine 1.011 1.005 - 1.030   pH 5.0 5.0 - 8.0   Glucose, UA NEGATIVE NEGATIVE mg/dL   Hgb urine dipstick SMALL (A) NEGATIVE   Bilirubin Urine NEGATIVE NEGATIVE   Ketones, ur NEGATIVE NEGATIVE mg/dL   Protein, ur NEGATIVE NEGATIVE mg/dL   Nitrite NEGATIVE NEGATIVE   Leukocytes,Ua MODERATE (A) NEGATIVE   RBC / HPF 0-5 0 - 5 RBC/hpf   WBC, UA >50 (H) 0 - 5 WBC/hpf   Bacteria, UA RARE (A) NONE SEEN   WBC Clumps PRESENT    Mucus PRESENT   Sodium, urine, random     Status: None   Collection Time: 04/02/22  6:00 AM  Result Value Ref Range   Sodium, Ur <10 mmol/L  Creatinine, urine, random     Status: None   Collection Time: 04/02/22  6:00 AM  Result Value Ref Range   Creatinine, Urine 81 mg/dL  Strep pneumoniae urinary antigen     Status: Abnormal   Collection Time: 04/02/22  6:00 AM  Result Value Ref Range   Strep Pneumo Urinary Antigen  POSITIVE (A) NEGATIVE  Expectorated Sputum Assessment w Gram Stain, Rflx to Resp Cult     Status: None   Collection Time: 04/02/22 10:00 AM   Specimen: Sputum  Result Value Ref Range   Specimen Description SPU    Special Requests EXPECTORATED    Sputum evaluation      THIS SPECIMEN IS ACCEPTABLE FOR SPUTUM CULTURE Performed at [facility], [address] [place], [place] [address]    Report Status 04/02/2022 FINAL      CT Soft Tissue Neck Wo Contrast  Result Date: 04/01/2022 CLINICAL DATA:  Initial evaluation for hoarseness, dysphagia. EXAM: CT NECK WITHOUT CONTRAST TECHNIQUE: Multidetector CT imaging of the neck was performed following the standard protocol without intravenous contrast. RADIATION DOSE REDUCTION: This exam was performed according to the departmental dose-optimization program which includes automated exposure control, adjustment of the mA and/or kV according to patient size and/or use of iterative reconstruction technique. COMPARISON:  None Available. FINDINGS: Pharynx and larynx: Oral cavity within normal limits. No visible acute inflammatory changes about the teeth. Oropharynx and nasopharynx within normal limits. No retropharyngeal collection or swelling. Negative epiglottis. Vallecula clear. Hypopharynx and supraglottic larynx within normal limits. Glottis symmetric without visible abnormality. Subglottic airway patent clear. Visualized upper esophagus within normal limits. Salivary glands: Salivary glands including the parotid and submandibular glands are grossly within normal limits. Thyroid: Thyroid within normal limits. Lymph nodes: No visible enlarged or pathologic adenopathy on this noncontrast examination. Vascular: Scattered atheromatous change about the aortic arch, carotid bifurcations, and skull base. Limited intracranial: Unremarkable. Visualized orbits: Prior bilateral ocular lens replacement. Remote posttraumatic defect noted at the left lamina papyracea.  Otherwise unremarkable. Mastoids and visualized paranasal sinuses: Mild chronic mucoperiosteal thickening noted about the ethmoidal air cells and maxillary sinuses. Trace right mastoid effusion noted, of doubtful significance. Middle ear cavities are well pneumatized and free of fluid. 1.4 cm sclerotic lesion at the anterior aspect of the left maxillary sinus (series 3, image 15), indeterminate, but likely benign. Skeleton: No other discrete or worrisome osseous lesions. Extensive anterior bulky endplate osteophytic spurring seen throughout the visualized cervicothoracic spine, suggesting dish. Mild chronic compression deformities noted at the superior endplates of T3 and T4. Osteoarthritic changes noted about the partially visualized shoulders. Upper chest: Mild patchy ground-glass opacity noted within the visualized left upper and lower lobes, likely mild infection/pneumonitis. Visualized upper chest demonstrates no other acute finding. Other: None. IMPRESSION: 1. Mild patchy ground-glass opacity within the visualized left upper and lower lobes, likely mild infection/pneumonitis. 2. Extensive anterior bulky endplate osteophytic spurring throughout the visualized cervicothoracic spine, consistent with DISH. These changes can sometimes be associated with dysphagia. 3. No other acute abnormality within the neck. Aortic Atherosclerosis (ICD10-I70.0). Electronically Signed   By: Benjamin  McClintock M.D.   On: 04/01/2022 23:24   CT Head Wo Contrast  Result Date: 04/01/2022 CLINICAL DATA:  Dysphagia for 2 months.  Shortness of breath. EXAM: CT HEAD WITHOUT CONTRAST TECHNIQUE: Contiguous axial images were obtained from the base of the skull through the vertex without intravenous contrast. RADIATION DOSE REDUCTION: This exam was performed according to the departmental dose-optimization program which includes automated exposure control, adjustment of the mA and/or kV according to patient size and/or use of iterative    reconstruction technique. COMPARISON:  12/03/2021 FINDINGS: Brain: Diffuse cerebral atrophy. Ventricular dilatation consistent with central atrophy. Cavum septum pellucidum. Low-attenuation changes in the deep white matter consistent with small vessel ischemia. No abnormal extra-axial fluid collections. No mass effect or midline shift. Gray-white matter junctions are distinct. Basal cisterns are not effaced. No acute intracranial hemorrhage. Vascular: Intracranial arterial vascular calcifications. Skull: Normal. Negative for fracture or focal lesion. Sinuses/Orbits: No acute finding. Other: None. IMPRESSION: No acute intracranial abnormalities. Chronic atrophy and small vessel ischemic changes. Electronically Signed   By: Burman Nieves M.D.   On: 04/01/2022 23:07   DG Chest 2 View  Result Date: 04/01/2022 CLINICAL DATA:  Shortness of breath.  Dysphagia for 2 months. EXAM: CHEST - 2 VIEW COMPARISON:  03/06/2022 FINDINGS: Heart size and pulmonary vascularity are normal. Emphysematous changes in the lungs. New infiltrates in the lung bases may indicate pneumonia or aspiration. No pleural effusions. No pneumothorax. Mediastinal contours appear intact. Severe degenerative changes in the right shoulder. Previous resection or resorption of the left humeral head with remodeling of bone and degenerative changes in the left shoulder. Degenerative changes also in the spine. IMPRESSION: 1. Patchy infiltrates in the lung bases may be due to pneumonia or aspiration. 2. Emphysematous changes in the lungs. 3. Severe degenerative changes in the visualized skeleton. Previous resection or resorption of the left humeral head. Electronically Signed   By: Burman Nieves M.D.   On: 04/01/2022 20:50    ROS:  As stated above in the HPI otherwise negative.  Blood pressure (!) 111/51, pulse 71, temperature 97.7 F (36.5 C), temperature source Oral, resp. rate 16, height 6' (1.829 m), weight 58.9 kg, SpO2 99 %.    PE: Gen:  NAD, Alert and Oriented HEENT:  [place]/AT, EOMI Neck: Supple, no LAD Lungs: CTA Bilaterally CV: RRR without M/G/R ABD: Soft, NTND, +BS Ext: No C/C/E  Assessment/Plan: 1) Dysphagia. 2) DISH. 3) Weight loss.   An EGD will be performed tomorrow as this is part of the multimodality work up.  The functional examination with the MBS will be useful.  Pending the results of the particular tests further recommendations will be made.  Plan: 1) EGD tomorrow.  Maisie Hauser D 04/02/2022, 1:14 PM

## 2022-04-02 NOTE — Progress Notes (Signed)
This is a no charge note as patient was seen and admitted earlier today by Dr. Myna Hidalgo -Patient presents with progressive dysphagia, initially to solids, currently even to liquids, with poor oral intake, currently with radiation pneumonia, AKI, GI consulted for endoscopy, if endoscopy is negative then will discuss with primary neurosurgeon Dr. Arnoldo Morale about possible DISH causing his symptoms.  SLP has been consulted as well, patient was seen and examined, plan was discussed with son in details. Phillips Climes MD

## 2022-04-02 NOTE — Consult Note (Signed)
CONSULT NOTE FOR [facility]  Reason for Consult: Dysphagia Referring Physician: [facility]  [facility] HPI: This is an 88 year old male with a PMH of HTN, gout, hypothyriodism, and back pain admitted for complaints of dysphagia and SOB.  The patient's daughter reports that he suffered with progressive dysphagia over the past month.  It was to both solids and liquids.  As a result there is a noticeable weight loss, but it was not quantified.  There was an associated SOB and cough and imaging of his chest and neck showed that there is extensive anterior bulky endplat osteophytic spurring consistent with DISH.  There was no prior problem with dysphagia before this past month.  Speech Pathology evaluated the patient and the plan is for an MBS.  Currently he is on thick liquids.  Past Medical History:  Diagnosis Date   Back pain 10/2008   per MRI 11/06/2008 - severe spinal stenosis worse at L4-5, L3-4, with neural compression at those levels   BPH (benign prostatic hyperplasia)    Eczema    Gout    Hypertension    Hypothyroidism     Past Surgical History:  Procedure Laterality Date   LAMINECTOMY  12/11/2008    Bilateral L2, L3, and L4 laminotomies, done by Dr. Lovell Sheehan    Family History  Problem Relation Age of Onset   Stroke Neg Hx    Cancer Neg Hx     Social History:  reports that he has never smoked. He has never used smokeless tobacco. He reports that he does not drink alcohol and does not use drugs.  Allergies: No Known Allergies  Medications: Scheduled:  heparin  5,000 Units Subcutaneous Q8H   levothyroxine  37.5 mcg Intravenous Daily   sodium chloride flush  3 mL Intravenous Q12H   Continuous:  ampicillin-sulbactam (UNASYN) IV Stopped (04/02/22 0707)   lactated ringers 100 mL/hr at 04/02/22 1200    Results for orders placed or performed during the hospital encounter of 04/01/22 (from the past 24 hour(s))  Troponin I (High Sensitivity)     Status: Abnormal    Collection Time: 04/01/22  8:13 PM  Result Value Ref Range   Troponin I (High Sensitivity) 87 (H) <18 ng/L  CBC with Differential     Status: Abnormal   Collection Time: 04/01/22  8:13 PM  Result Value Ref Range   WBC 6.2 4.0 - 10.5 K/uL   RBC 3.76 (L) 4.22 - 5.81 MIL/uL   Hemoglobin 10.7 (L) 13.0 - 17.0 g/dL   HCT 15.4 (L) 00.8 - 67.6 %   MCV 83.5 80.0 - 100.0 fL   MCH 28.5 26.0 - 34.0 pg   MCHC 34.1 30.0 - 36.0 g/dL   RDW 19.5 09.3 - 26.7 %   Platelets 316 150 - 400 K/uL   nRBC 0.0 0.0 - 0.2 %   Neutrophils Relative % 94 %   Neutro Abs 5.8 1.7 - 7.7 K/uL   Lymphocytes Relative 3 %   Lymphs Abs 0.2 (L) 0.7 - 4.0 K/uL   Monocytes Relative 3 %   Monocytes Absolute 0.2 0.1 - 1.0 K/uL   Eosinophils Relative 0 %   Eosinophils Absolute 0.0 0.0 - 0.5 K/uL   Basophils Relative 0 %   Basophils Absolute 0.0 0.0 - 0.1 K/uL   Immature Granulocytes 0 %   Abs Immature Granulocytes 0.02 0.00 - 0.07 K/uL  Basic metabolic panel     Status: Abnormal   Collection Time: 04/01/22  8:13 PM  Result Value Ref Range   Sodium 129 (L) 135 - 145 mmol/L   Potassium 3.9 3.5 - 5.1 mmol/L   Chloride 93 (L) 98 - 111 mmol/L   CO2 22 22 - 32 mmol/L   Glucose, Bld 102 (H) 70 - 99 mg/dL   BUN 70 (H) 8 - 23 mg/dL   Creatinine, Ser 2.15 (H) 0.61 - 1.24 mg/dL   Calcium 9.9 8.9 - 10.3 mg/dL   GFR, Estimated 29 (L) >60 mL/min   Anion gap 14 5 - 15  Resp Panel by RT-PCR (Flu A&B, Covid) Anterior Nasal Swab     Status: None   Collection Time: 04/01/22  9:56 PM   Specimen: Anterior Nasal Swab  Result Value Ref Range   SARS Coronavirus 2 by RT PCR NEGATIVE NEGATIVE   Influenza A by PCR NEGATIVE NEGATIVE   Influenza B by PCR NEGATIVE NEGATIVE  Lactic acid, plasma     Status: None   Collection Time: 04/01/22  9:56 PM  Result Value Ref Range   Lactic Acid, Venous 1.3 0.5 - 1.9 mmol/L  TSH     Status: Abnormal   Collection Time: 04/01/22  9:56 PM  Result Value Ref Range   TSH 19.225 (H) 0.350 - 4.500 uIU/mL   Troponin I (High Sensitivity)     Status: Abnormal   Collection Time: 04/01/22 11:05 PM  Result Value Ref Range   Troponin I (High Sensitivity) 84 (H) <18 ng/L  Lactic acid, plasma     Status: None   Collection Time: 04/01/22 11:05 PM  Result Value Ref Range   Lactic Acid, Venous 1.1 0.5 - 1.9 mmol/L  Basic metabolic panel     Status: Abnormal   Collection Time: 04/02/22  5:38 AM  Result Value Ref Range   Sodium 132 (L) 135 - 145 mmol/L   Potassium 3.8 3.5 - 5.1 mmol/L   Chloride 99 98 - 111 mmol/L   CO2 22 22 - 32 mmol/L   Glucose, Bld 83 70 - 99 mg/dL   BUN 63 (H) 8 - 23 mg/dL   Creatinine, Ser 1.96 (H) 0.61 - 1.24 mg/dL   Calcium 9.0 8.9 - 10.3 mg/dL   GFR, Estimated 33 (L) >60 mL/min   Anion gap 11 5 - 15  Procalcitonin - Baseline     Status: None   Collection Time: 04/02/22  5:38 AM  Result Value Ref Range   Procalcitonin 7.37 ng/mL  Urinalysis, Complete w Microscopic     Status: Abnormal   Collection Time: 04/02/22  6:00 AM  Result Value Ref Range   Color, Urine YELLOW YELLOW   APPearance HAZY (A) CLEAR   Specific Gravity, Urine 1.011 1.005 - 1.030   pH 5.0 5.0 - 8.0   Glucose, UA NEGATIVE NEGATIVE mg/dL   Hgb urine dipstick SMALL (A) NEGATIVE   Bilirubin Urine NEGATIVE NEGATIVE   Ketones, ur NEGATIVE NEGATIVE mg/dL   Protein, ur NEGATIVE NEGATIVE mg/dL   Nitrite NEGATIVE NEGATIVE   Leukocytes,Ua MODERATE (A) NEGATIVE   RBC / HPF 0-5 0 - 5 RBC/hpf   WBC, UA >50 (H) 0 - 5 WBC/hpf   Bacteria, UA RARE (A) NONE SEEN   WBC Clumps PRESENT    Mucus PRESENT   Sodium, urine, random     Status: None   Collection Time: 04/02/22  6:00 AM  Result Value Ref Range   Sodium, Ur <10 mmol/L  Creatinine, urine, random     Status: None   Collection Time: 04/02/22  6:00 AM  Result Value Ref Range   Creatinine, Urine 81 mg/dL  Strep pneumoniae urinary antigen     Status: Abnormal   Collection Time: 04/02/22  6:00 AM  Result Value Ref Range   Strep Pneumo Urinary Antigen  POSITIVE (A) NEGATIVE  Expectorated Sputum Assessment w Gram Stain, Rflx to Resp Cult     Status: None   Collection Time: 04/02/22 10:00 AM   Specimen: Sputum  Result Value Ref Range   Specimen Description SPU    Special Requests EXPECTORATED    Sputum evaluation      THIS SPECIMEN IS ACCEPTABLE FOR SPUTUM CULTURE Performed at [facility], [address] [place], [place] [address]    Report Status 04/02/2022 FINAL      CT Soft Tissue Neck Wo Contrast  Result Date: 04/01/2022 CLINICAL DATA:  Initial evaluation for hoarseness, dysphagia. EXAM: CT NECK WITHOUT CONTRAST TECHNIQUE: Multidetector CT imaging of the neck was performed following the standard protocol without intravenous contrast. RADIATION DOSE REDUCTION: This exam was performed according to the departmental dose-optimization program which includes automated exposure control, adjustment of the mA and/or kV according to patient size and/or use of iterative reconstruction technique. COMPARISON:  None Available. FINDINGS: Pharynx and larynx: Oral cavity within normal limits. No visible acute inflammatory changes about the teeth. Oropharynx and nasopharynx within normal limits. No retropharyngeal collection or swelling. Negative epiglottis. Vallecula clear. Hypopharynx and supraglottic larynx within normal limits. Glottis symmetric without visible abnormality. Subglottic airway patent clear. Visualized upper esophagus within normal limits. Salivary glands: Salivary glands including the parotid and submandibular glands are grossly within normal limits. Thyroid: Thyroid within normal limits. Lymph nodes: No visible enlarged or pathologic adenopathy on this noncontrast examination. Vascular: Scattered atheromatous change about the aortic arch, carotid bifurcations, and skull base. Limited intracranial: Unremarkable. Visualized orbits: Prior bilateral ocular lens replacement. Remote posttraumatic defect noted at the left lamina papyracea.  Otherwise unremarkable. Mastoids and visualized paranasal sinuses: Mild chronic mucoperiosteal thickening noted about the ethmoidal air cells and maxillary sinuses. Trace right mastoid effusion noted, of doubtful significance. Middle ear cavities are well pneumatized and free of fluid. 1.4 cm sclerotic lesion at the anterior aspect of the left maxillary sinus (series 3, image 15), indeterminate, but likely benign. Skeleton: No other discrete or worrisome osseous lesions. Extensive anterior bulky endplate osteophytic spurring seen throughout the visualized cervicothoracic spine, suggesting dish. Mild chronic compression deformities noted at the superior endplates of T3 and T4. Osteoarthritic changes noted about the partially visualized shoulders. Upper chest: Mild patchy ground-glass opacity noted within the visualized left upper and lower lobes, likely mild infection/pneumonitis. Visualized upper chest demonstrates no other acute finding. Other: None. IMPRESSION: 1. Mild patchy ground-glass opacity within the visualized left upper and lower lobes, likely mild infection/pneumonitis. 2. Extensive anterior bulky endplate osteophytic spurring throughout the visualized cervicothoracic spine, consistent with DISH. These changes can sometimes be associated with dysphagia. 3. No other acute abnormality within the neck. Aortic Atherosclerosis (ICD10-I70.0). Electronically Signed   By: Rise Mu M.D.   On: 04/01/2022 23:24   CT Head Wo Contrast  Result Date: 04/01/2022 CLINICAL DATA:  Dysphagia for 2 months.  Shortness of breath. EXAM: CT HEAD WITHOUT CONTRAST TECHNIQUE: Contiguous axial images were obtained from the base of the skull through the vertex without intravenous contrast. RADIATION DOSE REDUCTION: This exam was performed according to the departmental dose-optimization program which includes automated exposure control, adjustment of the mA and/or kV according to patient size and/or use of iterative  reconstruction technique. COMPARISON:  12/03/2021 FINDINGS: Brain: Diffuse cerebral atrophy. Ventricular dilatation consistent with central atrophy. Cavum septum pellucidum. Low-attenuation changes in the deep white matter consistent with small vessel ischemia. No abnormal extra-axial fluid collections. No mass effect or midline shift. Gray-white matter junctions are distinct. Basal cisterns are not effaced. No acute intracranial hemorrhage. Vascular: Intracranial arterial vascular calcifications. Skull: Normal. Negative for fracture or focal lesion. Sinuses/Orbits: No acute finding. Other: None. IMPRESSION: No acute intracranial abnormalities. Chronic atrophy and small vessel ischemic changes. Electronically Signed   By: Burman Nieves M.D.   On: 04/01/2022 23:07   DG Chest 2 View  Result Date: 04/01/2022 CLINICAL DATA:  Shortness of breath.  Dysphagia for 2 months. EXAM: CHEST - 2 VIEW COMPARISON:  03/06/2022 FINDINGS: Heart size and pulmonary vascularity are normal. Emphysematous changes in the lungs. New infiltrates in the lung bases may indicate pneumonia or aspiration. No pleural effusions. No pneumothorax. Mediastinal contours appear intact. Severe degenerative changes in the right shoulder. Previous resection or resorption of the left humeral head with remodeling of bone and degenerative changes in the left shoulder. Degenerative changes also in the spine. IMPRESSION: 1. Patchy infiltrates in the lung bases may be due to pneumonia or aspiration. 2. Emphysematous changes in the lungs. 3. Severe degenerative changes in the visualized skeleton. Previous resection or resorption of the left humeral head. Electronically Signed   By: Burman Nieves M.D.   On: 04/01/2022 20:50    ROS:  As stated above in the HPI otherwise negative.  Blood pressure (!) 111/51, pulse 71, temperature 97.7 F (36.5 C), temperature source Oral, resp. rate 16, height 6' (1.829 m), weight 58.9 kg, SpO2 99 %.    PE: Gen:  NAD, Alert and Oriented HEENT:  [place]/AT, EOMI Neck: Supple, no LAD Lungs: CTA Bilaterally CV: RRR without M/G/R ABD: Soft, NTND, +BS Ext: No C/C/E  Assessment/Plan: 1) Dysphagia. 2) DISH. 3) Weight loss.   An EGD will be performed tomorrow as this is part of the multimodality work up.  The functional examination with the MBS will be useful.  Pending the results of the particular tests further recommendations will be made.  Plan: 1) EGD tomorrow.  Moniqua Engebretsen D 04/02/2022, 1:14 PM

## 2022-04-02 NOTE — Progress Notes (Signed)
Transferring facility: [facility] Requesting provider: Dr. Samuel Gray (EDP at [facility]) Reason for transfer: admission for further evaluation and management of community-acquired pneumonia, potential aspiration pneumonia.   88 year old male who presented to [facility] ED complaining of 3 to 4 days of worsening productive cough associated with subjective fever/chills.  He also notes new onset/progressive dysphagia over the last 1 month noting that he is now experiencing difficulty with swallowing his home p.o. medications.   Vital signs in the ED were notable for the following: Afebrile, heart rate in the low 100s, normotensive blood pressures.  Maintaining oxygen saturations in the mid 90s on room air.  Labs were notable for initial troponin 87, with repeat value trending down to 84.  Additionally, presenting labs notable for acute kidney injury superimposed on stage 3a/b CKD.  Imaging notable for chest x-ray, which showed patchy infiltrates in the bilateral lung bases, suggestive of pneumonia as well as a potential element of aspiration.   Medications administered prior to transfer included the following: Azithromycin, Rocephin.   Subsequently, I accepted this patient for transfer for inpatient admission to [facility] or [facility] (first available) for further work-up and management of community-acquired pneumonia, including IV antibiotics, with potential expansion to include anaerobic coverage given potential element of aspiration in the context of the patient's report of 1 month of progressive dysphagia.      Check [email or web address] for on-call coverage.   Nursing staff, Please call [facility] Admits [facility] Consults [facility]-Wide number on Amion as soon as patient's arrival, so appropriate admitting provider can evaluate the pt.     Domonique Cothran, DO Hospitalist  

## 2022-04-02 NOTE — Evaluation (Signed)
Clinical/Bedside Swallow Evaluation Patient Details  Name: [name] [name] MRN: [id number] Date of Birth: 04-20-1934  Today's Date: 04/02/2022 Time: SLP Start Time (ACUTE ONLY): 1201 SLP Stop Time (ACUTE ONLY): 1231 SLP Time Calculation (min) (ACUTE ONLY): 30 min  Past Medical History:  Past Medical History:  Diagnosis Date   Back pain 10/2008   per MRI 11/06/2008 - severe spinal stenosis worse at L4-5, L3-4, with neural compression at those levels   BPH (benign prostatic hyperplasia)    Eczema    Gout    Hypertension    Hypothyroidism    Past Surgical History:  Past Surgical History:  Procedure Laterality Date   LAMINECTOMY  12/11/2008    Bilateral L2, L3, and L4 laminotomies, done by Dr. Lovell Sheehan   HPI:  [name] [name] is an 88 y.o. male who presents to the emergency department with dysphagia, shortness of breath, and cough. Dx DISH, pna, likely aspiration, AKI, hyponatremia.  Prior medical history significant for hypertension, hypothyroidism, and BPH. Patient has had worsening dysphagia to both solids and liquids over the past couple months. Has had frequent cough over the same interval, and has developed progressive shortness of breath and worsening productive cough over the past few days. Chest x-ray10/6 notable for patchy infiltrates in the bases and emphysematous changes. CT neck without contrast 10/6: "Extensive anterior bulky endplate osteophytic spurring throughout the visualized cervicothoracic spine, consistent with DISH. These changes can sometimes be associated with dysphagia."    Assessment / Plan / Recommendation  Clinical Impression  [name] [name] presents with s/s of dysphagia marked primarily by multiple sub-swallows (6-7) associated with single boluses of POs.  There was mild coughing noted when drinking thin liquids; no cough with purees or honey-thick liquids. Given dx of DISH, there may be poor clearance through the hypopharynx and/or impeded inversion of the epiglottis.  There were  no overt s/s of aspiration with purees/honey thick.  Recommend proceeding with MBS; allow dysphagia 1/honey thick liquids for now; crush necessary meds. Per Dr. Elnoria Howard, GI, EGD is scheduled for tomorrow. Will f/u with MBS as pt's schedule allows (Radiology schedule will not permit MBS today.) D/W pt, his dtr-in-law at the bedside, RN, and Dr. Randol Kern. SLP Visit Diagnosis: Dysphagia, unspecified (R13.10)    Aspiration Risk    tba   Diet Recommendation   Pureed/dysphagia 1, honey thick liquids  Medication Administration: Crushed with puree    Other  Recommendations Oral Care Recommendations: Oral care BID    Recommendations for follow up therapy are one component of a multi-disciplinary discharge planning process, led by the attending physician.  Recommendations may be updated based on patient status, additional functional criteria and insurance authorization.  Follow up Recommendations Other (comment) (tba)              Frequency and Duration min 2x/week  2 weeks       Prognosis Prognosis for Safe Diet Advancement: Fair      Swallow Study   General HPI: [name] [name] is an 88 y.o. male who presents to the emergency department with dysphagia, shortness of breath, and cough. Dx DISH, pna, likely aspiration, AKI, hyponatremia.  Prior medical history significant for hypertension, hypothyroidism, and BPH. Patient has had worsening dysphagia to both solids and liquids over the past couple months. Has had frequent cough over the same interval, and has developed progressive shortness of breath and worsening productive cough over the past few days. Chest x-ray10/6 notable for patchy infiltrates in the bases and emphysematous changes. CT neck without  contrast 10/6: "Extensive anterior bulky endplate osteophytic spurring throughout the visualized cervicothoracic spine, consistent with DISH. These changes can sometimes be associated with dysphagia." Type of Study: Bedside Swallow Evaluation Previous  Swallow Assessment: no Diet Prior to this Study: NPO Temperature Spikes Noted: No Respiratory Status: Room air History of Recent Intubation: No Behavior/Cognition: Alert;Cooperative Oral Cavity Assessment: Within Functional Limits Oral Care Completed by SLP: No Vision: Functional for self-feeding Self-Feeding Abilities: Able to feed self Patient Positioning: Upright in bed Baseline Vocal Quality: Hoarse Volitional Cough: Strong Volitional Swallow: Able to elicit    Oral/Motor/Sensory Function Overall Oral Motor/Sensory Function: Within functional limits   Ice Chips Ice chips: Not tested   Thin Liquid Thin Liquid: Impaired Presentation: Straw Pharyngeal  Phase Impairments: Multiple swallows;Cough - Delayed    Nectar Thick Nectar Thick Liquid: Not tested   Honey Thick Honey Thick Liquid: Impaired Presentation: Cup;Straw Pharyngeal Phase Impairments: Multiple swallows   Puree Puree: Impaired Presentation: Self Fed Pharyngeal Phase Impairments: Multiple swallows   Solid     Solid: Not tested      [name] [name] 04/02/2022,1:24 PM  Estill Bamberg L. Tivis Ringer, MA CCC/SLP Clinical Specialist - [place] Office number [phone]

## 2022-04-02 NOTE — Progress Notes (Signed)
Skin swarm complete with Narda Rutherford, Therapist, sports. No pressure related skin related breakdown noted. Callous was noted to back of left ankle.

## 2022-04-02 NOTE — H&P (Deleted)
Transferring facility: [facility] Requesting provider: Dr. Wynona Dove (EDP at [facility]) Reason for transfer: admission for further evaluation and management of community-acquired pneumonia, potential aspiration pneumonia.   88 year old male who presented to [facility] ED complaining of 3 to 4 days of worsening productive cough associated with subjective fever/chills.  He also notes new onset/progressive dysphagia over the last 1 month noting that he is now experiencing difficulty with swallowing his home p.o. medications.   Vital signs in the ED were notable for the following: Afebrile, heart rate in the low 100s, normotensive blood pressures.  Maintaining oxygen saturations in the mid 90s on room air.  Labs were notable for initial troponin 87, with repeat value trending down to 84.  Additionally, presenting labs notable for acute kidney injury superimposed on stage 3a/b CKD.  Imaging notable for chest x-ray, which showed patchy infiltrates in the bilateral lung bases, suggestive of pneumonia as well as a potential element of aspiration.   Medications administered prior to transfer included the following: Azithromycin, Rocephin.   Subsequently, I accepted this patient for transfer for inpatient admission to [name] or [facility] (first available) for further work-up and management of community-acquired pneumonia, including IV antibiotics, with potential expansion to include anaerobic coverage given potential element of aspiration in the context of the patient's report of 1 month of progressive dysphagia.      Check [email or web address] for on-call coverage.   Nursing staff, Please call [place] number on Amion as soon as patient's arrival, so appropriate admitting provider can evaluate the pt.     Babs Bertin, DO Hospitalist

## 2022-04-02 NOTE — ED Notes (Signed)
Pt, belongings, paperwork transported to [facility] at this time via [facility] 

## 2022-04-02 NOTE — ED Notes (Signed)
SBAR report given to [place] at this time.

## 2022-04-02 NOTE — Progress Notes (Signed)
Pharmacy Antibiotic Note  [name] [name] is a 88 y.o. male admitted on 04/01/2022 with aspiration pneumonia.  Pharmacy has been consulted for Unasyn dosing.  Plan: Unasyn 3g IV Q12H.  Height: 6' (182.9 cm) Weight: 58.9 kg (129 lb 13.6 oz) IBW/kg (Calculated) : 77.6  Temp (24hrs), Avg:98.3 F (36.8 C), Min:98.2 F (36.8 C), Max:98.4 F (36.9 C)  Recent Labs  Lab 03/31/22 1636 04/01/22 2013 04/01/22 2156 04/01/22 2305  WBC 13.9* 6.2  --   --   CREATININE 2.49* 2.15*  --   --   LATICACIDVEN  --   --  1.3 1.1    Estimated Creatinine Clearance: 20.5 mL/min (A) (by C-G formula based on SCr of 2.15 mg/dL (H)).    No Known Allergies   Thank you for allowing pharmacy to be a part of this patient's care.  Wynona Neat, PharmD, BCPS  04/02/2022 5:17 AM

## 2022-04-03 ENCOUNTER — Encounter (HOSPITAL_COMMUNITY): Payer: Self-pay | Admitting: Family Medicine

## 2022-04-03 ENCOUNTER — Inpatient Hospital Stay (HOSPITAL_COMMUNITY): Payer: Self-pay

## 2022-04-03 ENCOUNTER — Inpatient Hospital Stay (HOSPITAL_COMMUNITY): Payer: Self-pay | Admitting: Anesthesiology

## 2022-04-03 ENCOUNTER — Encounter (HOSPITAL_COMMUNITY)
Admission: EM | Disposition: A | Discharge status: Home / Self Care | Payer: Self-pay | Source: Home / Self Care | Attending: Internal Medicine

## 2022-04-03 DIAGNOSIS — N289 Disorder of kidney and ureter, unspecified: Secondary | ICD-10-CM

## 2022-04-03 DIAGNOSIS — M4810 Ankylosing hyperostosis [Forestier], site unspecified: Secondary | ICD-10-CM

## 2022-04-03 DIAGNOSIS — E039 Hypothyroidism, unspecified: Secondary | ICD-10-CM

## 2022-04-03 DIAGNOSIS — I10 Essential (primary) hypertension: Secondary | ICD-10-CM

## 2022-04-03 DIAGNOSIS — R1310 Dysphagia, unspecified: Secondary | ICD-10-CM

## 2022-04-03 HISTORY — PX: ESOPHAGOGASTRODUODENOSCOPY: SHX5428

## 2022-04-03 LAB — CBC
HCT: 24.7 % — ABNORMAL LOW (ref 39.0–52.0)
Hemoglobin: 8.6 g/dL — ABNORMAL LOW (ref 13.0–17.0)
MCH: 29.1 pg (ref 26.0–34.0)
MCHC: 34.8 g/dL (ref 30.0–36.0)
MCV: 83.4 fL (ref 80.0–100.0)
Platelets: 257 10*3/uL (ref 150–400)
RBC: 2.96 MIL/uL — ABNORMAL LOW (ref 4.22–5.81)
RDW: 14.9 % (ref 11.5–15.5)
WBC: 7 10*3/uL (ref 4.0–10.5)
nRBC: 0 % (ref 0.0–0.2)

## 2022-04-03 LAB — PROCALCITONIN: Procalcitonin: 6.81 ng/mL

## 2022-04-03 LAB — UREA NITROGEN, URINE: Urea Nitrogen, Ur: 682 mg/dL

## 2022-04-03 LAB — PHOSPHORUS: Phosphorus: 2.7 mg/dL (ref 2.5–4.6)

## 2022-04-03 LAB — BASIC METABOLIC PANEL
Anion gap: 8 (ref 5–15)
BUN: 52 mg/dL — ABNORMAL HIGH (ref 8–23)
CO2: 23 mmol/L (ref 22–32)
Calcium: 9 mg/dL (ref 8.9–10.3)
Chloride: 105 mmol/L (ref 98–111)
Creatinine, Ser: 1.53 mg/dL — ABNORMAL HIGH (ref 0.61–1.24)
GFR, Estimated: 44 mL/min — ABNORMAL LOW (ref >60)
Glucose, Bld: 93 mg/dL (ref 70–99)
Potassium: 3.5 mmol/L (ref 3.5–5.1)
Sodium: 136 mmol/L (ref 135–145)

## 2022-04-03 LAB — MAGNESIUM: Magnesium: 2 mg/dL (ref 1.7–2.4)

## 2022-04-03 SURGERY — EGD (ESOPHAGOGASTRODUODENOSCOPY)
Anesthesia: Monitor Anesthesia Care

## 2022-04-03 MED ORDER — PROPOFOL 500 MG/50ML IV EMUL
INTRAVENOUS | Status: DC | PRN
  Administered 2022-04-03: 100 ug/kg/min via INTRAVENOUS

## 2022-04-03 MED ORDER — PROSOURCE PLUS PO LIQD
30.0000 mL | Freq: Two times a day (BID) | ORAL | Status: DC
  Administered 2022-04-03 – 2022-04-05 (×4): 30 mL via ORAL
  Filled 2022-04-03 (×3): qty 30

## 2022-04-03 MED ORDER — LIDOCAINE 2% (20 MG/ML) 5 ML SYRINGE
INTRAMUSCULAR | Status: DC | PRN
  Administered 2022-04-03: 60 mg via INTRAVENOUS

## 2022-04-03 MED ORDER — POTASSIUM CHLORIDE 20 MEQ PO PACK
20.0000 meq | PACK | Freq: Once | ORAL | Status: AC
  Administered 2022-04-03: 20 meq via ORAL
  Filled 2022-04-03: qty 1

## 2022-04-03 MED ORDER — ENSURE ENLIVE PO LIQD
237.0000 mL | Freq: Three times a day (TID) | ORAL | Status: DC
  Administered 2022-04-03 – 2022-04-05 (×7): 237 mL via ORAL

## 2022-04-03 MED ORDER — SODIUM CHLORIDE 0.9 % IV SOLN: INTRAVENOUS | Status: DC

## 2022-04-03 MED ORDER — THIAMINE MONONITRATE 100 MG PO TABS
200.0000 mg | ORAL_TABLET | Freq: Every day | ORAL | Status: DC
  Administered 2022-04-03 – 2022-04-08 (×5): 200 mg via ORAL
  Filled 2022-04-03 (×6): qty 2

## 2022-04-03 MED ORDER — PROPOFOL 10 MG/ML IV BOLUS
INTRAVENOUS | Status: DC | PRN
  Administered 2022-04-03: 20 mg via INTRAVENOUS
  Administered 2022-04-03: 10 mg via INTRAVENOUS

## 2022-04-03 MED ORDER — K PHOS MONO-SOD PHOS DI & MONO 155-852-130 MG PO TABS
250.0000 mg | ORAL_TABLET | Freq: Two times a day (BID) | ORAL | Status: AC
  Administered 2022-04-03 – 2022-04-04 (×3): 250 mg via ORAL
  Filled 2022-04-03 (×3): qty 1

## 2022-04-03 MED ORDER — LEVOTHYROXINE SODIUM 50 MCG PO TABS
50.0000 ug | ORAL_TABLET | Freq: Every day | ORAL | Status: DC
  Administered 2022-04-04 – 2022-04-09 (×5): 50 ug via ORAL
  Filled 2022-04-03 (×5): qty 1

## 2022-04-03 MED ORDER — LACTATED RINGERS IV SOLN: INTRAVENOUS | Status: DC

## 2022-04-03 NOTE — Interval H&P Note (Signed)
History and Physical Interval Note:  04/03/2022 9:47 AM  [name] [name]  has presented today for surgery, with the diagnosis of Dysphagia.  The various methods of treatment have been discussed with the patient and family. After consideration of risks, benefits and other options for treatment, the patient has consented to  Procedure(s): ESOPHAGOGASTRODUODENOSCOPY (EGD) (N/A) as a surgical intervention.  The patient's history has been reviewed, patient examined, no change in status, stable for surgery.  I have reviewed the patient's chart and labs.  Questions were answered to the patient's satisfaction.     Merrill Deanda D

## 2022-04-03 NOTE — Progress Notes (Signed)
OT Cancellation Note  Patient Details Name: [name] [name] MRN: [id number] DOB: 06-Jan-1934   Cancelled Treatment:    Reason Eval/Treat Not Completed: Patient at procedure or test/ unavailable (EGD, OT to f/u as appropriate.)  Elliot Cousin 04/03/2022, 9:50 AM

## 2022-04-03 NOTE — Transfer of Care (Signed)
Immediate Anesthesia Transfer of Care Note  Patient: [name] [name]  Procedure(s) Performed: ESOPHAGOGASTRODUODENOSCOPY (EGD)  Patient Location: Endoscopy Unit  Anesthesia Type:MAC  Level of Consciousness: awake, alert  and oriented  Airway & Oxygen Therapy: Patient Spontanous Breathing  Post-op Assessment: Report given to RN and Post -op Vital signs reviewed and stable  Post vital signs: Reviewed and stable  Last Vitals:  Vitals Value Taken Time  BP    Temp    Pulse 88 04/03/22 1016  Resp 12 04/03/22 1016  SpO2 95 % 04/03/22 1016  Vitals shown include unvalidated device data.  Last Pain:  Vitals:   04/03/22 0938  TempSrc: Temporal  PainSc: 0-No pain         Complications: No notable events documented.

## 2022-04-03 NOTE — Anesthesia Preprocedure Evaluation (Signed)
Anesthesia Evaluation  Patient identified by MRN, date of birth, ID band Patient awake    Reviewed: Allergy & Precautions, NPO status , Patient's Chart, lab work & pertinent test results  Airway Mallampati: II  TM Distance: >3 FB     Dental   Pulmonary    breath sounds clear to auscultation       Cardiovascular hypertension,  Rhythm:Regular Rate:Normal     Neuro/Psych    GI/Hepatic Hx noted Dr. Nyoka Cowden   Endo/Other  Hypothyroidism   Renal/GU Renal disease     Musculoskeletal   Abdominal   Peds  Hematology   Anesthesia Other Findings   Reproductive/Obstetrics                             Anesthesia Physical Anesthesia Plan  ASA: 3  Anesthesia Plan: MAC   Post-op Pain Management:    Induction: Intravenous  PONV Risk Score and Plan: Treatment may vary due to age or medical condition and Ondansetron  Airway Management Planned: Nasal Cannula and Simple Face Mask  Additional Equipment:   Intra-op Plan:   Post-operative Plan:   Informed Consent: I have reviewed the patients History and Physical, chart, labs and discussed the procedure including the risks, benefits and alternatives for the proposed anesthesia with the patient or authorized representative who has indicated his/her understanding and acceptance.     Dental advisory given  Plan Discussed with: CRNA and Anesthesiologist  Anesthesia Plan Comments:         Anesthesia Quick Evaluation

## 2022-04-03 NOTE — Anesthesia Postprocedure Evaluation (Signed)
Anesthesia Post Note  Patient: [name] [name]  Procedure(s) Performed: ESOPHAGOGASTRODUODENOSCOPY (EGD)     Patient location during evaluation: Endoscopy Anesthesia Type: MAC Level of consciousness: awake Pain management: pain level not controlled Vital Signs Assessment: post-procedure vital signs reviewed and stable Respiratory status: spontaneous breathing Cardiovascular status: stable Postop Assessment: no apparent nausea or vomiting Anesthetic complications: no   No notable events documented.  Last Vitals:  Vitals:   04/03/22 1040 04/03/22 1200  BP: 128/72 (!) 121/50  Pulse: 68 65  Resp: 14 15  Temp:  36.5 C  SpO2: 96% 97%    Last Pain:  Vitals:   04/03/22 1200  TempSrc: Oral  PainSc:                  Jacqueline Delapena

## 2022-04-03 NOTE — Anesthesia Postprocedure Evaluation (Signed)
Anesthesia Post Note  Patient: [name] [name]  Procedure(s) Performed: ESOPHAGOGASTRODUODENOSCOPY (EGD)     Anesthesia Post Evaluation No notable events documented.  Last Vitals:  Vitals:   04/03/22 0800 04/03/22 0938  BP: (!) 146/63 133/77  Pulse: 78 68  Resp: 18 16  Temp: 36.7 C 36.6 C  SpO2: 100% 99%    Last Pain:  Vitals:   04/03/22 0938  TempSrc: Temporal  PainSc: 0-No pain                 Kel Senn

## 2022-04-03 NOTE — Op Note (Signed)
[facility] Patient Name: [name] [name] Procedure Date : 04/03/2022 MRN: [id number] Attending MD: Carol Ada , MD Date of Birth: May 26, 1934 CSN: [id number] Age: 88 Admit Type: Inpatient Procedure:                Upper GI endoscopy Indications:              Dysphagia Providers:                Carol Ada, MD, Grace Isaac, RN, Darliss Cheney,                            Technician Referring MD:              Medicines:                Propofol per Anesthesia Complications:            No immediate complications. Estimated Blood Loss:     Estimated blood loss: none. Procedure:                Pre-Anesthesia Assessment:                           - Prior to the procedure, a History and Physical                            was performed, and patient medications and                            allergies were reviewed. The patient's tolerance of                            previous anesthesia was also reviewed. The risks                            and benefits of the procedure and the sedation                            options and risks were discussed with the patient.                            All questions were answered, and informed consent                            was obtained. Prior Anticoagulants: The patient has                            taken no previous anticoagulant or antiplatelet                            agents. ASA Grade Assessment: II - A patient with                            mild systemic disease. After reviewing the risks                            and  benefits, the patient was deemed in                            satisfactory condition to undergo the procedure.                           - Sedation was administered by an anesthesia                            professional. Deep sedation was attained.                           After obtaining informed consent, the endoscope was                            passed under direct vision. Throughout the                             procedure, the patient's blood pressure, pulse, and                            oxygen saturations were monitored continuously. The                            [id number]) Olympus endoscope was introduced                            through the mouth, and advanced to the second part                            of duodenum. The upper GI endoscopy was                            accomplished without difficulty. The patient                            tolerated the procedure well. Scope In: Scope Out: Findings:      The esophagus was normal. See below.      The stomach was normal.      The examined duodenum was normal.      There was no obvious stenosis. It is possibe that and osteophyte was       externally compressing the region just below the UES. Subjectively,       passing the tip over this area of impingement gave a "hard" sensation. Impression:               - Normal esophagus.                           - Normal stomach.                           - Normal examined duodenum.                           - No specimens collected. Recommendation:           -  Return patient to hospital ward for ongoing care.                           - Dysphagia 1.                           - Continue present medications.                           - MBS per Speech Path.                           -  GI will assume care in the AM. Procedure Code(s):        --- Professional ---                           [phone], Esophagogastroduodenoscopy, flexible,                            transoral; diagnostic, including collection of                            specimen(s) by brushing or washing, when performed                            (separate procedure) Diagnosis Code(s):        --- Professional ---                           R13.10, Dysphagia, unspecified CPT copyright 2019 American Medical Association. All rights reserved. The codes documented in this report are preliminary and upon coder review may   be revised to meet current compliance requirements. Carol Ada, MD Carol Ada, MD 04/03/2022 10:20:26 AM This report has been signed electronically. Number of Addenda: 0

## 2022-04-03 NOTE — Progress Notes (Addendum)
PROGRESS NOTE    [name] [name]  SWN:[id number] DOB: [birth year] DOA: 04/01/2022 PCP: Ladell Pier, MD   Chief Complaint  Patient presents with   Dysphagia    Brief Narrative:   [name] [name] is a pleasant 88 y.o. male with medical history significant for hypertension, hypothyroidism, and BPH who presents to the emergency department with dysphagia, shortness of breath, and cough.   -His work-up was significant for aspiration of pneumonia, due to worsening dysphagia over the last month, she has CT significant for DISH.  Assessment & Plan:   Principal Problem:   Aspiration pneumonia ([place]) Active Problems:   Hypothyroidism   Essential hypertension   Dysphagia   Hyponatremia   Elevated troponin   Acute renal failure superimposed on stage 3a chronic kidney disease (HCC)   Aspiration pneumonia  - Presents with worsening cough and SOB in setting of worsening dysphagia to solids and liquids  -Seen by SLP, recommendation for Pg1 with thin liquid  -Continue with IV Unasyn  -Encourage use incentive spirometry and flutter valve  Dysphagia -Dysphagia appears to be most likely due to DISH given findings on imaging. -GI input greatly appreciated, status post EGD 10/8, with no acute finding -MBS by SLP next week -Patient with very poor oral intake, patient/family would like to proceed with PEG if he has no improvement with his dysphagia with current management.  DISH -Likely causing his dysphagia, cussed with neurosurgery, they will evaluate today, they request MRI cervical spine without contrast, it has been ordered.   AKI superimposed on CKD IIIa  -Due to volume depletion and dehydration, improving with IV fluids   Hyponatremia  - Serum sodium is 129 in setting of hypovolemia  - Continue isotonic IVF and repeat chem panel     Elevated troponin  - Troponin mildly elevated and flat with no anginal complaints, low suspicion for ACS  - Treat underlying pneumonia and AKI     Hypertension  - Treat as needed only for now    Hypothyroidism  - TSH is 19.225 in ED  - He has been unable to take his Synthroid d/t dysphagia, will use IV for now   Severe protein calorie malnutrition - Body mass index is 17.61 kg/m. - will add ensure and prosource -  consult nutritionist.  Meanwhile her phosphorus level closely for refeeding syndrome     DVT prophylaxis: Lovenox Code Status: Full Family Communication: Discussed  at bedside  Status is: Inpatient    Consultants:  Gastroenterology unassigned for [place]>> Dr Benson Norway  covering over the weekend Neurosurgery(primary is Dr. Arnoldo Morale), Dr Dawley consulted   Procedures:  -Noscapine by Dr. Almyra Free 10/8  Subjective:  No significant events overnight, he denies any complaints today  Objective: Vitals:   04/03/22 1014 04/03/22 1021 04/03/22 1030 04/03/22 1040  BP: (!) 135/96 118/67 127/60 128/72  Pulse: 94 81 70 68  Resp: 15 18 17 14   Temp: 97.9 F (36.6 C)     TempSrc: Temporal     SpO2: 100% 94% 98% 96%  Weight:      Height:        Intake/Output Summary (Last 24 hours) at 04/03/2022 1211 Last data filed at 04/02/2022 1818 Gross per 24 hour  Intake 701.64 ml  Output --  Net 701.64 ml   Filed Weights   04/01/22 1955 04/02/22 0441 04/03/22 0938  Weight: 60.2 kg 58.9 kg 58.9 kg    Examination:  Awake Alert, Oriented X 3, frail, thin appearing Symmetrical Chest wall movement, Good air  movement bilaterally, CTAB RRR,No Gallops,Rubs or new Murmurs, No Parasternal Heave +ve B.Sounds, Abd Soft, No tenderness, No rebound - guarding or rigidity. No Cyanosis, Clubbing or edema, No new Rash or bruise     Data Reviewed: I have personally reviewed following labs and imaging studies  CBC: Recent Labs  Lab 03/31/22 1636 04/01/22 2013 04/03/22 0538  WBC 13.9* 6.2 7.0  NEUTROABS 12.3* 5.8  --   HGB 10.2* 10.7* 8.6*  HCT 30.5* 31.4* 24.7*  MCV 86.4 83.5 83.4  PLT 288 316 257    Basic Metabolic  Panel: Recent Labs  Lab 03/31/22 1636 04/01/22 2013 04/02/22 0538 04/03/22 0538  NA 129* 129* 132* 136  K 4.2 3.9 3.8 3.5  CL 97* 93* 99 105  CO2 22 22 22 23   GLUCOSE 91 102* 83 93  BUN 65* 70* 63* 52*  CREATININE 2.49* 2.15* 1.96* 1.53*  CALCIUM 9.2 9.9 9.0 9.0  MG  --   --   --  2.0  PHOS  --   --   --  2.7    GFR: Estimated Creatinine Clearance: 28.9 mL/min (A) (by C-G formula based on SCr of 1.53 mg/dL (H)).  Liver Function Tests: Recent Labs  Lab 03/31/22 1636  AST 16  ALT 9  ALKPHOS 52  BILITOT 1.0  PROT 7.1  ALBUMIN 3.1*    CBG: No results for input(s): "GLUCAP" in the last 168 hours.   Recent Results (from the past 240 hour(s))  Resp Panel by RT-PCR (Flu A&B, Covid) Anterior Nasal Swab     Status: None   Collection Time: 04/01/22  9:56 PM   Specimen: Anterior Nasal Swab  Result Value Ref Range Status   SARS Coronavirus 2 by RT PCR NEGATIVE NEGATIVE Final    Comment: (NOTE) SARS-CoV-2 target nucleic acids are NOT DETECTED.  The SARS-CoV-2 RNA is generally detectable in upper respiratory specimens during the acute phase of infection. The lowest concentration of SARS-CoV-2 viral copies this assay can detect is 138 copies/mL. A negative result does not preclude SARS-Cov-2 infection and should not be used as the sole basis for treatment or other patient management decisions. A negative result may occur with  improper specimen collection/handling, submission of specimen other than nasopharyngeal swab, presence of viral mutation(s) within the areas targeted by this assay, and inadequate number of viral copies(<138 copies/mL). A negative result must be combined with clinical observations, patient history, and epidemiological information. The expected result is Negative.  Fact Sheet for Patients:  06/01/22  Fact Sheet for Healthcare Providers:  BloggerCourse.com  This test is no t yet approved or  cleared by the [employer] FDA and  has been authorized for detection and/or diagnosis of SARS-CoV-2 by FDA under an Emergency Use Authorization (EUA). This EUA will remain  in effect (meaning this test can be used) for the duration of the COVID-19 declaration under Section 564(b)(1) of the Act, 21 U.S.C.section 360bbb-3(b)(1), unless the authorization is terminated  or revoked sooner.       Influenza A by PCR NEGATIVE NEGATIVE Final   Influenza B by PCR NEGATIVE NEGATIVE Final    Comment: (NOTE) The Xpert Xpress SARS-CoV-2/FLU/RSV plus assay is intended as an aid in the diagnosis of influenza from Nasopharyngeal swab specimens and should not be used as a sole basis for treatment. Nasal washings and aspirates are unacceptable for Xpert Xpress SARS-CoV-2/FLU/RSV testing.  Fact Sheet for Patients: [place]  Fact Sheet for Healthcare Providers: BloggerCourse.com  This test is not yet approved  or cleared by the [place] and has been authorized for detection and/or diagnosis of SARS-CoV-2 by FDA under an Emergency Use Authorization (EUA). This EUA will remain in effect (meaning this test can be used) for the duration of the COVID-19 declaration under Section 564(b)(1) of the Act, 21 U.S.C. section 360bbb-3(b)(1), unless the authorization is terminated or revoked.  Performed at [facility], [address], [place], [place] [address]   Blood culture (routine x 2)     Status: None (Preliminary result)   Collection Time: 04/01/22 10:05 PM   Specimen: BLOOD  Result Value Ref Range Status   Specimen Description   Final    BLOOD Performed at [facility], [address] [place], [place] [address]    Special Requests   Final    NONE Performed at [facility], [address] [place], [place] [address]    Culture   Final    NO GROWTH < 12  HOURS Performed at [facility], [address] [place], [place] [address]    Report Status PENDING  Incomplete  Blood culture (routine x 2)     Status: None (Preliminary result)   Collection Time: 04/01/22 10:10 PM   Specimen: BLOOD  Result Value Ref Range Status   Specimen Description   Final    BLOOD Performed at [facility], [address], [place], [place] [address]    Special Requests   Final    NONE Performed at [facility], [address] [place], [place] [address]    Culture   Final    NO GROWTH < 12 HOURS Performed at [name], [address] [place], [place] [address]    Report Status PENDING  Incomplete  Expectorated Sputum Assessment w Gram Stain, Rflx to Resp Cult     Status: None   Collection Time: 04/02/22 10:00 AM   Specimen: Sputum  Result Value Ref Range Status   Specimen Description SPU  Final   Special Requests EXPECTORATED  Final   Sputum evaluation   Final    THIS SPECIMEN IS ACCEPTABLE FOR SPUTUM CULTURE Performed at [facility], [address], [place], [place] [address]    Report Status 04/02/2022 FINAL  Final  Culture, Respiratory w Gram Stain     Status: None (Preliminary result)   Collection Time: 04/02/22 10:00 AM   Specimen: Sputum  Result Value Ref Range Status   Specimen Description SPU  Final   Special Requests EXPECTORATED Reflexed from [id number]  Final   Gram Stain PENDING  Incomplete   Culture   Final    CULTURE REINCUBATED FOR BETTER GROWTH Performed at [facility], [address], [place], [place] [address]    Report Status PENDING  Incomplete         Radiology Studies: CT Soft Tissue Neck Wo Contrast  Result Date: 04/01/2022 CLINICAL DATA:  Initial evaluation for hoarseness, dysphagia. EXAM: CT NECK WITHOUT CONTRAST TECHNIQUE: Multidetector CT imaging of the neck was performed following the standard protocol without intravenous contrast. RADIATION DOSE  REDUCTION: This exam was performed according to the departmental dose-optimization program which includes automated exposure control, adjustment of the mA and/or kV according to patient size and/or use of iterative reconstruction technique. COMPARISON:  None Available. FINDINGS: Pharynx and larynx: Oral cavity within normal limits. No visible acute inflammatory changes about the teeth. Oropharynx and nasopharynx within normal limits. No retropharyngeal collection or swelling. Negative epiglottis. Vallecula  clear. Hypopharynx and supraglottic larynx within normal limits. Glottis symmetric without visible abnormality. Subglottic airway patent clear. Visualized upper esophagus within normal limits. Salivary glands: Salivary glands including the parotid and submandibular glands are grossly within normal limits. Thyroid: Thyroid within normal limits. Lymph nodes: No visible enlarged or pathologic adenopathy on this noncontrast examination. Vascular: Scattered atheromatous change about the aortic arch, carotid bifurcations, and skull base. Limited intracranial: Unremarkable. Visualized orbits: Prior bilateral ocular lens replacement. Remote posttraumatic defect noted at the left lamina papyracea. Otherwise unremarkable. Mastoids and visualized paranasal sinuses: Mild chronic mucoperiosteal thickening noted about the ethmoidal air cells and maxillary sinuses. Trace right mastoid effusion noted, of doubtful significance. Middle ear cavities are well pneumatized and free of fluid. 1.4 cm sclerotic lesion at the anterior aspect of the left maxillary sinus (series 3, image 15), indeterminate, but likely benign. Skeleton: No other discrete or worrisome osseous lesions. Extensive anterior bulky endplate osteophytic spurring seen throughout the visualized cervicothoracic spine, suggesting dish. Mild chronic compression deformities noted at the superior endplates of T3 and T4. Osteoarthritic changes noted about the partially  visualized shoulders. Upper chest: Mild patchy ground-glass opacity noted within the visualized left upper and lower lobes, likely mild infection/pneumonitis. Visualized upper chest demonstrates no other acute finding. Other: None. IMPRESSION: 1. Mild patchy ground-glass opacity within the visualized left upper and lower lobes, likely mild infection/pneumonitis. 2. Extensive anterior bulky endplate osteophytic spurring throughout the visualized cervicothoracic spine, consistent with DISH. These changes can sometimes be associated with dysphagia. 3. No other acute abnormality within the neck. Aortic Atherosclerosis (ICD10-I70.0). Electronically Signed   By: Rise Mu M.D.   On: 04/01/2022 23:24   CT Head Wo Contrast  Result Date: 04/01/2022 CLINICAL DATA:  Dysphagia for 2 months.  Shortness of breath. EXAM: CT HEAD WITHOUT CONTRAST TECHNIQUE: Contiguous axial images were obtained from the base of the skull through the vertex without intravenous contrast. RADIATION DOSE REDUCTION: This exam was performed according to the departmental dose-optimization program which includes automated exposure control, adjustment of the mA and/or kV according to patient size and/or use of iterative reconstruction technique. COMPARISON:  12/03/2021 FINDINGS: Brain: Diffuse cerebral atrophy. Ventricular dilatation consistent with central atrophy. Cavum septum pellucidum. Low-attenuation changes in the deep white matter consistent with small vessel ischemia. No abnormal extra-axial fluid collections. No mass effect or midline shift. Gray-white matter junctions are distinct. Basal cisterns are not effaced. No acute intracranial hemorrhage. Vascular: Intracranial arterial vascular calcifications. Skull: Normal. Negative for fracture or focal lesion. Sinuses/Orbits: No acute finding. Other: None. IMPRESSION: No acute intracranial abnormalities. Chronic atrophy and small vessel ischemic changes. Electronically Signed   By:  Burman Nieves M.D.   On: 04/01/2022 23:07   DG Chest 2 View  Result Date: 04/01/2022 CLINICAL DATA:  Shortness of breath.  Dysphagia for 2 months. EXAM: CHEST - 2 VIEW COMPARISON:  03/06/2022 FINDINGS: Heart size and pulmonary vascularity are normal. Emphysematous changes in the lungs. New infiltrates in the lung bases may indicate pneumonia or aspiration. No pleural effusions. No pneumothorax. Mediastinal contours appear intact. Severe degenerative changes in the right shoulder. Previous resection or resorption of the left humeral head with remodeling of bone and degenerative changes in the left shoulder. Degenerative changes also in the spine. IMPRESSION: 1. Patchy infiltrates in the lung bases may be due to pneumonia or aspiration. 2. Emphysematous changes in the lungs. 3. Severe degenerative changes in the visualized skeleton. Previous resection or resorption of the left humeral head. Electronically Signed   By: Chrissie Noa  Andria Meuse M.D.   On: 04/01/2022 20:50        Scheduled Meds:  heparin  5,000 Units Subcutaneous Q8H   levothyroxine  37.5 mcg Intravenous Daily   phosphorus  250 mg Oral BID   sodium chloride flush  3 mL Intravenous Q12H   Continuous Infusions:  ampicillin-sulbactam (UNASYN) IV 3 g (04/03/22 7371)   lactated ringers 50 mL/hr at 04/03/22 0956     LOS: 1 day      Huey Bienenstock, MD [facility]   To contact the attending provider between 7A-7P or the covering provider during after hours 7P-7A, please log into the web site [email or web address] and access using universal [place] password for that web site. If you do not have the password, please call the hospital operator.  04/03/2022, 12:11 PM

## 2022-04-03 NOTE — Anesthesia Procedure Notes (Signed)
Procedure Name: MAC Date/Time: 04/03/2022 10:02 AM  Performed by: Rande Brunt, CRNAPre-anesthesia Checklist: Patient identified, Emergency Drugs available, Suction available and Patient being monitored Patient Re-evaluated:Patient Re-evaluated prior to induction Oxygen Delivery Method: Circle system utilized Preoxygenation: Pre-oxygenation with 100% oxygen Induction Type: IV induction Placement Confirmation: positive ETCO2, CO2 detector and breath sounds checked- equal and bilateral Dental Injury: Teeth and Oropharynx as per pre-operative assessment

## 2022-04-04 ENCOUNTER — Inpatient Hospital Stay (HOSPITAL_COMMUNITY): Payer: Self-pay

## 2022-04-04 LAB — BASIC METABOLIC PANEL
Anion gap: 7 (ref 5–15)
BUN: 44 mg/dL — ABNORMAL HIGH (ref 8–23)
CO2: 24 mmol/L (ref 22–32)
Calcium: 9.3 mg/dL (ref 8.9–10.3)
Chloride: 107 mmol/L (ref 98–111)
Creatinine, Ser: 1.23 mg/dL (ref 0.61–1.24)
GFR, Estimated: 57 mL/min — ABNORMAL LOW (ref >60)
Glucose, Bld: 106 mg/dL — ABNORMAL HIGH (ref 70–99)
Potassium: 3.8 mmol/L (ref 3.5–5.1)
Sodium: 138 mmol/L (ref 135–145)

## 2022-04-04 LAB — CBC
HCT: 26.7 % — ABNORMAL LOW (ref 39.0–52.0)
Hemoglobin: 9.1 g/dL — ABNORMAL LOW (ref 13.0–17.0)
MCH: 28.7 pg (ref 26.0–34.0)
MCHC: 34.1 g/dL (ref 30.0–36.0)
MCV: 84.2 fL (ref 80.0–100.0)
Platelets: 300 10*3/uL (ref 150–400)
RBC: 3.17 MIL/uL — ABNORMAL LOW (ref 4.22–5.81)
RDW: 14.9 % (ref 11.5–15.5)
WBC: 5.6 10*3/uL (ref 4.0–10.5)
nRBC: 0 % (ref 0.0–0.2)

## 2022-04-04 LAB — VITAMIN B12: Vitamin B-12: 64 pg/mL — ABNORMAL LOW (ref 180–914)

## 2022-04-04 LAB — PHOSPHORUS: Phosphorus: 2.9 mg/dL (ref 2.5–4.6)

## 2022-04-04 LAB — PROCALCITONIN: Procalcitonin: 4.3 ng/mL

## 2022-04-04 MED ORDER — PIPERACILLIN-TAZOBACTAM 3.375 G IVPB
3.3750 g | Freq: Three times a day (TID) | INTRAVENOUS | Status: DC
  Administered 2022-04-04 – 2022-04-08 (×11): 3.375 g via INTRAVENOUS
  Filled 2022-04-04 (×13): qty 50

## 2022-04-04 NOTE — Progress Notes (Signed)
PT Cancellation Note  Patient Details Name: [name] [name] MRN: [id number] DOB: 1933/11/01   Cancelled Treatment:    Reason Eval/Treat Not Completed: (P) Patient at procedure or test/unavailable Pt is off floor. PT will follow back for Evaluation this afternoon as able.  Kin Galbraith B. Migdalia Dk PT, DPT Acute Rehabilitation Services Please use secure chat or  Call Office [phone]    [place] 04/04/2022, 8:28 AM

## 2022-04-04 NOTE — Evaluation (Signed)
Occupational Therapy Evaluation Patient Details Name: [name] [name] MRN: [id number] DOB: [birth year] Today's Date: 04/04/2022   History of Present Illness [name] [name] is a pleasant 88 y.o. male with medical history significant for hypertension, hypothyroidism, and BPH who presents to the emergency department with dysphagia, shortness of breath, and cough.  His work-up was significant for aspiration of pneumonia, due to worsening dysphagia over the last month, he has CT significant for DISH.   Clinical Impression   Pt presents with the above diagnosis in bed with son present to translate as tele-interpreter does not have the correct dialect. PTA pt requiring up assistance for bathing, dressing and toileting at a MOD-MAX A level d/t poor ROM in arms after UE fx a few years ago (per sons report). PTA pt was able to get up with supervision and walk up to a half mile outside with son using SPC. This date, pt requires MOD A for trunk elevation sup>sit, MIN A for power up into standing and min guard-MIN A for balance during functional mobility in the hallway with SPC. Pt would benefit from [name] to put in shower for energy conservation and to improve independence with self care skills. Do not recommend follow up OT services at this time as pt is at baseline for ADL performance but does demo decreased activity tolerance/balance. Mobility specialist consult recommended.      Recommendations for follow up therapy are one component of a multi-disciplinary discharge planning process, led by the attending physician.  Recommendations may be updated based on patient status, additional functional criteria and insurance authorization.   Follow Up Recommendations  No OT follow up    Assistance Recommended at Discharge Frequent or constant Supervision/Assistance  Patient can return home with the following A little help with walking and/or transfers;A lot of help with bathing/dressing/bathroom (ADL status at baseline)     Functional Status Assessment  Patient has had a recent decline in their functional status and demonstrates the ability to make significant improvements in function in a reasonable and predictable amount of time.  Equipment Recommendations  BSC/3in1    Recommendations for Other Services       Precautions / Restrictions Precautions Precautions: None Restrictions Weight Bearing Restrictions: No      Mobility Bed Mobility Overal bed mobility: Needs Assistance Bed Mobility: Supine to Sit     Supine to sit: Mod assist     General bed mobility comments: son providing A for trunk elevation pulling on UB    Transfers Overall transfer level: Needs assistance Equipment used: 1 person hand held assist, Straight cane Transfers: Sit to/from Stand Sit to Stand: Min assist, Mod assist           General transfer comment: MIN A power up, MOD A to slow momentum on decent to chair-- son reporting weaker than baseline      Balance Overall balance assessment: Needs assistance Sitting-balance support: Feet supported Sitting balance-Leahy Scale: Fair       Standing balance-Leahy Scale: Fair                             ADL either performed or assessed with clinical judgement   ADL Overall ADL's : Needs assistance/impaired         Upper Body Bathing: Minimal assistance   Lower Body Bathing: Maximal assistance   Upper Body Dressing : Minimal assistance   Lower Body Dressing: Maximal assistance       Toileting- Clothing  Manipulation and Hygiene: Maximal assistance         General ADL Comments: at basline pt family providing MOD-MAX A for BADLs since decline in function after LUE arm fx     Vision Baseline Vision/History: 0 No visual deficits (son reporting no need for glasses) Ability to See in Adequate Light: 0 Adequate Patient Visual Report: No change from baseline Vision Assessment?: No apparent visual deficits     Perception     Praxis  Praxis Praxis: Intact    Pertinent Vitals/Pain Pain Assessment Pain Assessment: No/denies pain     Hand Dominance Right   Extremity/Trunk Assessment Upper Extremity Assessment Upper Extremity Assessment: Generalized weakness (history of LUE fx per son)   Lower Extremity Assessment Lower Extremity Assessment: Generalized weakness   Cervical / Trunk Assessment Cervical / Trunk Assessment: Kyphotic   Communication Communication Communication:  (son used as interpreter-- teleinterpreter not correct dialect)   Cognition Arousal/Alertness: Awake/alert   Overall Cognitive Status: Within Functional Limits for tasks assessed                                       General Comments  min guard- MIN A. increased lateral sway with fatigue    Exercises     Shoulder Instructions      Home Living Family/patient expects to be discharged to:: Private residence Living Arrangements: Children Available Help at Discharge: Family Type of Home: House Home Access: Stairs to enter Secretary/administrator of Steps: 3 Entrance Stairs-Rails: Left Home Layout: One level     Bathroom Shower/Tub: Walk-in shower;Curtain (no shower chair)   Bathroom Toilet: Standard         Additional Comments: cane      Prior Functioning/Environment Prior Level of Function : Needs assist  Cognitive Assist : ADLs (cognitive)     Physical Assist : ADLs (physical)   ADLs (physical): Bathing;Dressing;Toileting            OT Problem List: Decreased strength;Decreased coordination;Decreased activity tolerance;Decreased safety awareness;Impaired balance (sitting and/or standing);Decreased knowledge of use of DME or AE;Impaired UE functional use      OT Treatment/Interventions:      OT Goals(Current goals can be found in the care plan section)    OT Frequency:      Co-evaluation              AM-PAC OT "6 Clicks" Daily Activity     Outcome Measure Help from another person  eating meals?: A Little Help from another person taking care of personal grooming?: A Little Help from another person toileting, which includes using toliet, bedpan, or urinal?: A Lot Help from another person bathing (including washing, rinsing, drying)?: A Lot Help from another person to put on and taking off regular upper body clothing?: A Little Help from another person to put on and taking off regular lower body clothing?: A Lot 6 Click Score: 15   End of Session Equipment Utilized During Treatment: Gait belt;Other (comment) [facility]) Nurse Communication: Mobility status  Activity Tolerance:   Patient left: in chair;with call bell/phone within reach;with chair alarm set;with family/visitor present  OT Visit Diagnosis: Unsteadiness on feet (R26.81);Other abnormalities of gait and mobility (R26.89);Muscle weakness (generalized) (M62.81);History of falling (Z91.81)                Time: 3716-9678 OT Time Calculation (min): 17 min Charges:  OT General Charges $OT Visit: 1 Visit OT  Evaluation $OT Eval Low Complexity: 1 Low  [address] MOTR/L   Shon Hale 04/04/2022, 9:16 AM

## 2022-04-04 NOTE — Progress Notes (Addendum)
Modified Barium Swallow Progress Note  Patient Details  Name: [name] [name] MRN: [id number] Date of Birth: [birth year]  Today's Date: 04/04/2022  Modified Barium Swallow completed.  Full report located under Chart Review in the Imaging Section.  Brief recommendations include the following:  Clinical Impression  Pt presents with a severe pharyngeal dysphagia related to structural deficits of anterior vertebrae (large anterior syndesmophytes and/or osteophytes per radiology).  He did not demonstrate aspiration during today's study, likely due to limited quantities consumed.  There was good arytenoid-to-base of epiglottis contact, which helped with airway protection, but the epiglottis is unable to fully close over the larynx due to mechanical obstruction from cervical vertebrae.  There is poor patency of UES.  As a result, barium  (thin and puree) sat in valleculae and hypopharynx while pt swallowed multiple times in an effort to transfer material through pharynx/esophagus.  We attempted  a reclined position - this may have helped marginally to transition thin liquids.  His son was present for exam: we reviewed video, the chronic nature of this mechanical dysphagia, the difficulty his father has had meeting his nutritional needs. He is hoping to speak to [name] [name], neurosurgery, about candidacy for repair.  He asked questions about the necessity of a feeding tube. His son verbalized understanding of his father's advanced age and how this will impact decisions about management.  For now, continue to allow purees; there is no benefit in thickening liquids - allow thin liquids per pt's preferences.  D/W [name] [name]. SLP will follow for plan.     Swallow Evaluation Recommendations       SLP Diet Recommendations: Dysphagia 1 (Puree) solids;Thin liquid   Liquid Administration via: Cup;Straw   Medication Administration: Crushed with puree   Supervision: Patient able to self feed   Compensations:  (try  reclining when sipping liquids)       Oral Care Recommendations: Oral care BID       [name] [name] L. Tivis Ringer, MA CCC/SLP Clinical Specialist - Acute Care SLP Acute Rehabilitation Services Office number [phone]  [name] [name] 04/04/2022,9:35 AM

## 2022-04-04 NOTE — Progress Notes (Signed)
Interventional Radiology Brief Note:  IR consulted for gastrostomy tube placement in patient with poor PO intake, recent aspiration pneumonia. CT Abdomen ordered for evaluation of anatomy prior to review for placement.   Brynda Greathouse, MS RD PA-C

## 2022-04-04 NOTE — TOC Initial Note (Addendum)
Transition of Care [facility]) - Initial/Assessment Note    Patient Details  Name: [name] [name] MRN: [id number] Date of Birth: [birth year]  Transition of Care [facility] CM/SW Contact:    Harriet Masson, RN Phone Number: 04/04/2022, 3:55 PM  Clinical Narrative:                 Spoke to son, Godfrey Pick, at bedside regarding transition needs.  Son is agreeable to OP rehab.  Referral sent to Neuro rehab on [address]. [facility] and [name] ordered through [facility].  Patient has transportation and gets his prescriptions at [facility] and [facility].  Pam with [employer] notified of possible tube feedings. Patient doesn't have insurance. Pam will reach out to RD team.  Patient will need MATCH at discharge.   TOC will continue to follow for needs.   Expected Discharge Plan: OP Rehab Barriers to Discharge: Continued Medical Work up   Patient Goals and CMS Choice Patient states their goals for this hospitalization and ongoing recovery are:: return home CMS Medicare.gov Compare Post Acute Care list provided to:: Patient Represenative (must comment) Choice offered to / list presented to : Adult Children  Expected Discharge Plan and Services Expected Discharge Plan: OP Rehab   Discharge Planning Services: CM Consult, MATCH Program   Living arrangements for the past 2 months: Single Family Home                 DME Arranged: Walker rolling, Bedside commode DME Agency: [employer] Date DME Agency Contacted: 04/04/22 Time DME Agency Contacted: [phone] Representative spoke with at DME Agency: Lawernce Keas            Prior Living Arrangements/Services Living arrangements for the past 2 months: Single Family Home Lives with:: Adult Children Patient language and need for interpreter reviewed:: Yes Do you feel safe going back to the place where you live?: Yes      Need for Family Participation in Patient Care: Yes (Comment) Care giver support system in place?: Yes (comment)   Criminal Activity/Legal  Involvement Pertinent to Current Situation/Hospitalization: No - Comment as needed  Activities of Daily Living Home Assistive Devices/Equipment: Cane (specify quad or straight) ADL Screening (condition at time of admission) Patient's cognitive ability adequate to safely complete daily activities?: Yes Is the patient deaf or have difficulty hearing?: No Does the patient have difficulty seeing, even when wearing glasses/contacts?: No Does the patient have difficulty concentrating, remembering, or making decisions?: No Patient able to express need for assistance with ADLs?: Yes Does the patient have difficulty dressing or bathing?: Yes Independently performs ADLs?: No Communication: Independent Does the patient have difficulty walking or climbing stairs?: Yes Weakness of Legs: Both Weakness of Arms/Hands: Both  Permission Sought/Granted Permission sought to share information with : Photographer granted to share info w AGENCY: OP rehab        Emotional Assessment Appearance:: Appears older than stated age     Orientation: : Fluctuating Orientation (Suspected and/or reported Sundowners) Alcohol / Substance Use: Not Applicable Psych Involvement: No (comment)  Admission diagnosis:  CAP (community acquired pneumonia) [J18.9] AKI (acute kidney injury) (HCC) [N17.9] Hypothyroidism, unspecified type [E03.9] Community acquired pneumonia, unspecified laterality [J18.9] Sepsis, due to unspecified organism, unspecified whether acute organ dysfunction present [facility]) [A41.9] Patient Active Problem List   Diagnosis Date Noted   Dysphagia 04/02/2022   Aspiration pneumonia (HCC) 04/02/2022   Hyponatremia 04/02/2022   Elevated troponin 04/02/2022   Acute renal failure superimposed on  stage 3a chronic kidney disease ([place]) 04/02/2022   Edema 12/07/2021   Venous insufficiency of both lower extremities 12/07/2021   Eczema 12/07/2021   Hx of gout 01/24/2017    Hypothyroidism 10/30/2008   Anemia, chronic disease 09/18/2008   Essential hypertension 09/18/2008   BENIGN PROSTATIC HYPERTROPHY, WITH URINARY OBSTRUCTION 09/18/2008   BACK PAIN, CHRONIC 09/18/2008   PCP:  Ladell Pier, MD Pharmacy:   [facility] [address], [place] [address] Phone: [phone] Fax: [phone]     Social Determinants of Health (SDOH) Interventions    Readmission Risk Interventions     No data to display

## 2022-04-04 NOTE — Evaluation (Signed)
Physical Therapy Evaluation Patient Details Name: [name] [name] MRN: [id number] DOB: 02-29-1934 Today's Date: 04/04/2022  History of Present Illness  [name] [name] is a pleasant 88 y.o. male with medical history significant for hypertension, hypothyroidism, and BPH who presents to the emergency department with dysphagia, shortness of breath, and cough.  His work-up was significant for aspiration of pneumonia, due to worsening dysphagia over the last month, he has CT significant for DISH.  Clinical Impression  PTA pt living with son and family in single story home with 3 steps to enter. Pt's family assisted with ADL/iADLs, and was able to ambulate around the block with his son using a cane. Pt is currently limited in safe mobility by decreased strength and decreased familiarity with RW. Pt is min A for transfers and ambulation with cane and RW however requires less physical assist to ambulate with RW. Pt needs increased cuing for RW use but PT and son in agreement that he is steadier with RW. PT recommending HHPT and RW and 3 in 1, may be difficult due to lack of insurance.        Recommendations for follow up therapy are one component of a multi-disciplinary discharge planning process, led by the attending physician.  Recommendations may be updated based on patient status, additional functional criteria and insurance authorization.  Follow Up Recommendations Home health PT (however has no insurance so may not be possible)      Assistance Recommended at Discharge Frequent or constant Supervision/Assistance  Patient can return home with the following  A little help with walking and/or transfers;A lot of help with bathing/dressing/bathroom;Assistance with cooking/housework;Direct supervision/assist for medications management;Direct supervision/assist for financial management;Assist for transportation;Help with stairs or ramp for entrance    Equipment Recommendations Rolling walker (2 wheels);BSC/3in1      Functional Status Assessment Patient has had a recent decline in their functional status and demonstrates the ability to make significant improvements in function in a reasonable and predictable amount of time.     Precautions / Restrictions Precautions Precautions: None Restrictions Weight Bearing Restrictions: No      Mobility  Bed Mobility               General bed mobility comments: sitting up in recliner on entry    Transfers Overall transfer level: Needs assistance Equipment used: 1 person hand held assist, Straight cane Transfers: Sit to/from Stand Sit to Stand: Min assist           General transfer comment: min A for power up and steadying with cane    Ambulation/Gait Ambulation/Gait assistance: Min assist Gait Distance (Feet): 10 Feet (80) Assistive device: Straight cane, Rolling walker (2 wheels) Gait Pattern/deviations: Step-through pattern, Trunk flexed, Narrow base of support, Shuffle Gait velocity: improved with RW Gait velocity interpretation: <1.31 ft/sec, indicative of household ambulator   General Gait Details: min A for steadying with ambulation 10 feet around foot of bed with cane, switched to RW and pt with much more fluid gait still requiring light min A, pt very unfamiliar with RW use and constant cuing through his son for closer proximity to RW        Balance Overall balance assessment: Needs assistance Sitting-balance support: Feet supported Sitting balance-Leahy Scale: Fair       Standing balance-Leahy Scale: Fair                               Pertinent Vitals/Pain Pain Assessment Pain  Assessment: No/denies pain    Home Living Family/patient expects to be discharged to:: Private residence Living Arrangements: Children Available Help at Discharge: Family Type of Home: House Home Access: Stairs to enter Entrance Stairs-Rails: Left Entrance Stairs-Number of Steps: 3   Home Layout: One level   Additional  Comments: cane    Prior Function Prior Level of Function : Needs assist  Cognitive Assist : ADLs (cognitive)     Physical Assist : ADLs (physical);Mobility (physical) Mobility (physical): Stairs ADLs (physical): Bathing;Dressing;Toileting Mobility Comments: pt's son reports he and his father walk around to block every day       Hand Dominance   Dominant Hand: Right    Extremity/Trunk Assessment   Upper Extremity Assessment Upper Extremity Assessment: Defer to OT evaluation    Lower Extremity Assessment Lower Extremity Assessment: Generalized weakness    Cervical / Trunk Assessment Cervical / Trunk Assessment: Kyphotic  Communication   Communication: Prefers language other than [place] (son used as interpreter-- teleinterpreter not correct dialect)  Cognition Arousal/Alertness: Awake/alert   Overall Cognitive Status: Within Functional Limits for tasks assessed                                          General Comments General comments (skin integrity, edema, etc.): HR WFL for ambulation        Assessment/Plan    PT Assessment Patient needs continued PT services  PT Problem List Decreased strength;Decreased activity tolerance;Decreased balance;Decreased mobility;Decreased knowledge of use of DME       PT Treatment Interventions DME instruction;Gait training;Stair training;Functional mobility training;Therapeutic activities;Therapeutic exercise;Balance training;Cognitive remediation;Patient/family education    PT Goals (Current goals can be found in the Care Plan section)  Acute Rehab PT Goals PT Goal Formulation: With patient/family Time For Goal Achievement: 04/18/22 Potential to Achieve Goals: Good    Frequency Min 3X/week        AM-PAC PT "6 Clicks" Mobility  Outcome Measure Help needed turning from your back to your side while in a flat bed without using bedrails?: A Little Help needed moving from lying on your back to sitting on the  side of a flat bed without using bedrails?: A Little Help needed moving to and from a bed to a chair (including a wheelchair)?: A Little Help needed standing up from a chair using your arms (e.g., wheelchair or bedside chair)?: A Little Help needed to walk in hospital room?: A Little Help needed climbing 3-5 steps with a railing? : A Lot 6 Click Score: 17    End of Session Equipment Utilized During Treatment: Gait belt Activity Tolerance: Patient tolerated treatment well Patient left: in chair;with call bell/phone within reach;with chair alarm set;with family/visitor present Nurse Communication: Mobility status PT Visit Diagnosis: Other abnormalities of gait and mobility (R26.89);Muscle weakness (generalized) (M62.81);Difficulty in walking, not elsewhere classified (R26.2)    Time: 6160-7371 PT Time Calculation (min) (ACUTE ONLY): 19 min   Charges:   PT Evaluation $PT Eval Moderate Complexity: 1 Mod          Corri Delapaz B. Beverely Risen PT, DPT Acute Rehabilitation Services Please use secure chat or  Call Office [phone]   Elon Alas Fleet 04/04/2022, 10:15 AM

## 2022-04-04 NOTE — Consult Note (Signed)
Reason for Consult: Dysphagia Referring Physician: [name] is an 88 y.o. male.  HPI: The patient is an 88 year old black male immigrant from [place] who was admitted with dysphagia.  I performed a lumbar laminectomy on him in 2010.  He has done very well and have not seen him in years.  The work-up included a neck CT which demonstrated findings consistent with diffuse idiopathic skeletal hyperostosis with anterior spine osteophytes.  He has undergone endoscopy and a barium swallow.  A neurosurgical consultation was requested.  During today's visit the patient is accompanied by his son who translates for [place].  He denies neck pain, numbness, tingling, weakness, etc.  Past Medical History:  Diagnosis Date   Back pain 10/2008   per MRI 11/06/2008 - severe spinal stenosis worse at L4-5, L3-4, with neural compression at those levels   BPH (benign prostatic hyperplasia)    Eczema    Gout    Hypertension    Hypothyroidism     Past Surgical History:  Procedure Laterality Date   LAMINECTOMY  12/11/2008    Bilateral L2, L3, and L4 laminotomies, done by Dr. Lovell Sheehan    Family History  Problem Relation Age of Onset   Stroke Neg Hx    Cancer Neg Hx     Social History:  reports that he has never smoked. He has never used smokeless tobacco. He reports that he does not drink alcohol and does not use drugs.  Allergies: No Known Allergies  Medications: I have reviewed the patient's current medications. Prior to Admission:  Medications Prior to Admission  Medication Sig Dispense Refill Last Dose   allopurinol (ZYLOPRIM) 100 MG tablet Take 1 tablet (100 mg total) by mouth once daily. 30 tablet 5 04/01/2022   bisoprolol (ZEBETA) 5 MG tablet Take 1 tablet (5 mg total) by mouth once daily. (Patient taking differently: Take 5 mg by mouth daily as needed (For high blood pressure).) 30 tablet 0 Past Week at 0800   cetirizine (ZYRTEC) 10 MG tablet Take 1 tablet (10 mg total) by mouth daily.  30 tablet 1 Past Week   doxazosin (CARDURA) 4 MG tablet Take 1 tablet (4 mg total) by mouth once nightly at bedtime. 30 tablet 4 04/01/2022   fluticasone (FLONASE) 50 MCG/ACT nasal spray Place 2 sprays into both nostrils daily. (Patient taking differently: Place 2 sprays into both nostrils daily as needed for allergies.) 16 g 1 Past Week   furosemide (LASIX) 20 MG tablet Take 1 tablet (20 mg total) by mouth once daily. 30 tablet 1 04/01/2022   Iron, Ferrous Sulfate, 325 (65 Fe) MG TABS Take 1 tablet (325 mg) by mouth every other day. (Patient taking differently: Take 325 mg by mouth daily.) 15 tablet 11 04/01/2022   levothyroxine (SYNTHROID) 50 MCG tablet Take 1 tablet (50 mcg total) by mouth once daily before breakfast. 30 tablet 6 04/01/2022   aspirin EC 81 MG tablet Take 1 tablet (81 mg total) by mouth daily. Swallow whole. (Patient not taking: Reported on 12/21/2021) 30 tablet 12 Not Taking   triamcinolone cream (KENALOG) 0.1 % Apply 1 application topically to the affected area(s) 2 (two) times daily. (Patient not taking: Reported on 04/03/2022) 30 g 0 Not Taking   Scheduled:  (feeding supplement) PROSource Plus  30 mL Oral BID BM   feeding supplement  237 mL Oral TID BM   heparin  5,000 Units Subcutaneous Q8H   levothyroxine  50 mcg Oral Q0600   sodium chloride flush  3 mL Intravenous Q12H   thiamine  200 mg Oral Daily   Continuous:  ampicillin-sulbactam (UNASYN) IV 3 g (04/04/22 5361)   lactated ringers Stopped (04/03/22 1801)   lactated ringers 50 mL/hr at 04/04/22 0256   WER:[id number] **OR** acetaminophen, labetalol, ondansetron **OR** ondansetron (ZOFRAN) IV Anti-infectives (From admission, onward)    Start     Dose/Rate Route Frequency Ordered Stop   04/02/22 0600  Ampicillin-Sulbactam (UNASYN) 3 g in sodium chloride 0.9 % 100 mL IVPB        3 g 200 mL/hr over 30 Minutes Intravenous Every 12 hours 04/02/22 0516     04/01/22 2200  cefTRIAXone (ROCEPHIN) 1 g in sodium chloride 0.9  % 100 mL IVPB        1 g 200 mL/hr over 30 Minutes Intravenous  Once 04/01/22 2146 04/01/22 2308   04/01/22 2200  azithromycin (ZITHROMAX) 500 mg in sodium chloride 0.9 % 250 mL IVPB        500 mg 250 mL/hr over 60 Minutes Intravenous  Once 04/01/22 2146 04/01/22 2358        Results for orders placed or performed during the hospital encounter of 04/01/22 (from the past 48 hour(s))  Basic metabolic panel     Status: Abnormal   Collection Time: 04/03/22  5:38 AM  Result Value Ref Range   Sodium 136 135 - 145 mmol/L   Potassium 3.5 3.5 - 5.1 mmol/L   Chloride 105 98 - 111 mmol/L   CO2 23 22 - 32 mmol/L   Glucose, Bld 93 70 - 99 mg/dL    Comment: Glucose reference range applies only to samples taken after fasting for at least 8 hours.   BUN 52 (H) 8 - 23 mg/dL   Creatinine, Ser 1.53 (H) 0.61 - 1.24 mg/dL   Calcium 9.0 8.9 - 10.3 mg/dL   GFR, Estimated 44 (L) >60 mL/min    Comment: (NOTE) Calculated using the CKD-EPI Creatinine Equation (2021)    Anion gap 8 5 - 15    Comment: Performed at [facility] [address], [place], [place] [address]  CBC     Status: Abnormal   Collection Time: 04/03/22  5:38 AM  Result Value Ref Range   WBC 7.0 4.0 - 10.5 K/uL   RBC 2.96 (L) 4.22 - 5.81 MIL/uL   Hemoglobin 8.6 (L) 13.0 - 17.0 g/dL   HCT 24.7 (L) 39.0 - 52.0 %   MCV 83.4 80.0 - 100.0 fL   MCH 29.1 26.0 - 34.0 pg   MCHC 34.8 30.0 - 36.0 g/dL   RDW 14.9 11.5 - 15.5 %   Platelets 257 150 - 400 K/uL   nRBC 0.0 0.0 - 0.2 %    Comment: Performed at [facility], [place] [address], [place], [place] [address]  Procalcitonin     Status: None   Collection Time: 04/03/22  5:38 AM  Result Value Ref Range   Procalcitonin 6.81 ng/mL    Comment:        Interpretation: PCT > 2 ng/mL: Systemic infection (sepsis) is likely, unless other causes are known. (NOTE)       Sepsis PCT Algorithm           Lower Respiratory Tract                                      Infection PCT  Algorithm    ----------------------------     ----------------------------  PCT < 0.25 ng/mL                PCT < 0.10 ng/mL          Strongly encourage             Strongly discourage   discontinuation of antibiotics    initiation of antibiotics    ----------------------------     -----------------------------       PCT 0.25 - 0.50 ng/mL            PCT 0.10 - 0.25 ng/mL               OR       >80% decrease in PCT            Discourage initiation of                                            antibiotics      Encourage discontinuation           of antibiotics    ----------------------------     -----------------------------         PCT >= 0.50 ng/mL              PCT 0.26 - 0.50 ng/mL               AND       <80% decrease in PCT              Encourage initiation of                                             antibiotics       Encourage continuation           of antibiotics    ----------------------------     -----------------------------        PCT >= 0.50 ng/mL                  PCT > 0.50 ng/mL               AND         increase in PCT                  Strongly encourage                                      initiation of antibiotics    Strongly encourage escalation           of antibiotics                                     -----------------------------                                           PCT <= 0.25 ng/mL  OR                                        > 80% decrease in PCT                                      Discontinue / Do not initiate                                             antibiotics  Performed at [facility], [address], [place], [place] [address]   Phosphorus     Status: None   Collection Time: 04/03/22  5:38 AM  Result Value Ref Range   Phosphorus 2.7 2.5 - 4.6 mg/dL    Comment: Performed at [facility], [address], [place], [place] [address]  Magnesium     Status: None   Collection  Time: 04/03/22  5:38 AM  Result Value Ref Range   Magnesium 2.0 1.7 - 2.4 mg/dL    Comment: Performed at [facility], [address], [place], [place] [address]  CBC     Status: Abnormal   Collection Time: 04/04/22  1:51 AM  Result Value Ref Range   WBC 5.6 4.0 - 10.5 K/uL   RBC 3.17 (L) 4.22 - 5.81 MIL/uL   Hemoglobin 9.1 (L) 13.0 - 17.0 g/dL   HCT 52.7 (L) 78.2 - 42.3 %   MCV 84.2 80.0 - 100.0 fL   MCH 28.7 26.0 - 34.0 pg   MCHC 34.1 30.0 - 36.0 g/dL   RDW 53.6 14.4 - 31.5 %   Platelets 300 150 - 400 K/uL   nRBC 0.0 0.0 - 0.2 %    Comment: Performed at [facility], [address], [place], [place] [address]  Vitamin B12     Status: Abnormal   Collection Time: 04/04/22  1:51 AM  Result Value Ref Range   Vitamin B-12 64 (L) 180 - 914 pg/mL    Comment: (NOTE) This assay is not validated for testing neonatal or myeloproliferative syndrome specimens for Vitamin B12 levels. Performed at [facility], [address] [place], [place] [address]   Basic metabolic panel     Status: Abnormal   Collection Time: 04/04/22  5:59 AM  Result Value Ref Range   Sodium 138 135 - 145 mmol/L   Potassium 3.8 3.5 - 5.1 mmol/L   Chloride 107 98 - 111 mmol/L   CO2 24 22 - 32 mmol/L   Glucose, Bld 106 (H) 70 - 99 mg/dL    Comment: Glucose reference range applies only to samples taken after fasting for at least 8 hours.   BUN 44 (H) 8 - 23 mg/dL   Creatinine, Ser 0.93 0.61 - 1.24 mg/dL   Calcium 9.3 8.9 - 26.7 mg/dL   GFR, Estimated 57 (L) >60 mL/min    Comment: (NOTE) Calculated using the CKD-EPI Creatinine Equation (2021)    Anion gap 7 5 - 15    Comment: Performed at [facility], [address], [place], [place] [address]  Procalcitonin     Status: None   Collection Time: 04/04/22  5:59 AM  Result Value Ref Range   Procalcitonin 4.30 ng/mL    Comment:        Interpretation: PCT > 2 ng/mL: Systemic infection (sepsis) is likely, unless other causes are  known. (NOTE)       Sepsis PCT Algorithm           Lower Respiratory Tract                                      Infection PCT Algorithm    ----------------------------     ----------------------------         PCT < 0.25 ng/mL                PCT < 0.10 ng/mL          Strongly encourage             Strongly discourage   discontinuation of antibiotics    initiation of antibiotics    ----------------------------     -----------------------------       PCT 0.25 - 0.50 ng/mL            PCT 0.10 - 0.25 ng/mL               OR       >80% decrease in PCT            Discourage initiation of                                            antibiotics      Encourage discontinuation           of antibiotics    ----------------------------     -----------------------------         PCT >= 0.50 ng/mL              PCT 0.26 - 0.50 ng/mL               AND       <80% decrease in PCT              Encourage initiation of                                             antibiotics       Encourage continuation           of antibiotics    ----------------------------     -----------------------------        PCT >= 0.50 ng/mL                  PCT > 0.50 ng/mL               AND         increase in PCT                  Strongly encourage                                      initiation of antibiotics    Strongly encourage escalation  of antibiotics                                     -----------------------------                                           PCT <= 0.25 ng/mL                                                 OR                                        > 80% decrease in PCT                                      Discontinue / Do not initiate                                             antibiotics  Performed at [facility], [address] [place], [place] [address]   Phosphorus     Status: None   Collection Time: 04/04/22  5:59 AM  Result Value Ref Range   Phosphorus 2.9 2.5 - 4.6 mg/dL     Comment: Performed at [name], [address] [place], [place] [address]    DG Swallowing Func-Speech Pathology  Result Date: 04/04/2022 Table formatting from the original result was not included. Images from the original result were not included. Objective Swallowing Evaluation: Type of Study: MBS-Modified Barium Swallow Study  Patient Details Name: [name] [name] MRN: [id number] Date of Birth: 03-29-1934 Today's Date: 04/04/2022 Time: SLP Start Time (ACUTE ONLY): 0820 -SLP Stop Time (ACUTE ONLY): 0900 SLP Time Calculation (min) (ACUTE ONLY): 40 min Past Medical History: Past Medical History: Diagnosis Date  Back pain 10/2008  per MRI 11/06/2008 - severe spinal stenosis worse at L4-5, L3-4, with neural compression at those levels  BPH (benign prostatic hyperplasia)   Eczema   Gout   Hypertension   Hypothyroidism  Past Surgical History: Past Surgical History: Procedure Laterality Date  LAMINECTOMY  12/11/2008   Bilateral L2, L3, and L4 laminotomies, done by Dr. Lovell Sheehan HPI: Merlyn Conley is an 88 y.o. male who presents to the emergency department with dysphagia, shortness of breath, and cough. Dx DISH, pna, likely aspiration, AKI, hyponatremia.  Prior medical history significant for hypertension, hypothyroidism, and BPH. Patient has had worsening dysphagia to both solids and liquids over the past couple months. Has had frequent cough over the same interval, and has developed progressive shortness of breath and worsening productive cough over the past few days. Chest x-ray10/6 notable for patchy infiltrates in the bases and emphysematous changes. CT neck without contrast 10/6: "Extensive anterior bulky endplate osteophytic spurring throughout the visualized cervicothoracic spine, consistent with DISH. These changes can sometimes be associated with dysphagia."  MR cervical spine 10/7: "Large anterior syndesmophytes are fused  at C5, C6 and C7. Fusion extends to T1." EGD 10/6 - normal esophagus  Subjective: alert   Recommendations for follow up therapy are one component of a multi-disciplinary discharge planning process, led by the attending physician.  Recommendations may be updated based on patient status, additional functional criteria and insurance authorization. Assessment / Plan / Recommendation   04/04/2022   9:03 AM Clinical Impressions Clinical Impression Pt presents with a severe pharyngeal dysphagia related to structural deficits of anterior vertebrae (large anterior syndesmophytes and/or osteophytes per radiology).  He did not demonstrate aspiration during today's study, likely due to limited quantities consumed.  There was good arytenoid-to-base of epiglottis contact, which helped with airway protection, but the epiglottis is unable to fully close over the larynx due to mechanical obstruction from cervical vertebrae.  There is poor patency of UES.  As a result, barium  (thin and puree) sat in valleculae and hypopharynx while pt swallowed multiple times in an effort to transfer material through pharynx/esophagus.  We attempted  a reclined position - this may have helped marginally to transition thin liquids.  His son was present for exam: we reviewed video, the chronic nature of this mechanical dysphagia, the difficulty his father has had meeting his nutritional needs. He is hoping to speak to Dr. Lovell Sheehan, neurosurgery, about candidacy for repair.  He asked questions about the necessity of a feeding tube. His son verbalized understanding of his father's advanced age and how this will impact decisions about management.  For now, continue to allow purees; there is no benefit in thickening liquids - allow thin liquids per pt's preferences.  D/W Dr. Jerral Ralph. SLP will follow for plan. SLP Visit Diagnosis Dysphagia, pharyngeal phase (R13.13) Impact on safety and function Moderate aspiration risk     04/04/2022   9:03 AM Treatment Recommendations Treatment Recommendations Therapy as outlined in treatment plan below      04/04/2022   9:03 AM Prognosis Prognosis for Safe Diet Advancement Fair   04/04/2022   9:03 AM Diet Recommendations SLP Diet Recommendations Dysphagia 1 (Puree) solids;Thin liquid Liquid Administration via Cup;Straw Medication Administration Crushed with puree Compensations --     04/04/2022   9:03 AM Other Recommendations Oral Care Recommendations Oral care BID Follow Up Recommendations Other (comment)   04/04/2022   9:03 AM Frequency and Duration  Speech Therapy Frequency (ACUTE ONLY) min 2x/week Treatment Duration 2 weeks     04/04/2022   9:03 AM Oral Phase Oral Phase Impaired Oral - Thin Straw WFL Oral - Puree So [facility]    04/04/2022   9:03 AM Pharyngeal Phase Pharyngeal Phase Impaired Pharyngeal- Thin Straw Reduced epiglottic inversion;Pharyngeal residue - valleculae;Pharyngeal residue - pyriform Pharyngeal- Puree Reduced epiglottic inversion;Pharyngeal residue - valleculae;Pharyngeal residue - pyriform     No data to display    Blenda Mounts Laurice 04/04/2022, 9:36 AM                     MR CERVICAL SPINE WO CONTRAST  Result Date: 04/03/2022 CLINICAL DATA:  Chronic neck pain.  DISH. EXAM: MRI CERVICAL SPINE WITHOUT CONTRAST TECHNIQUE: Multiplanar, multisequence MR imaging of the cervical spine was performed. No intravenous contrast was administered. COMPARISON:  None Available. FINDINGS: Alignment: Slight degenerative retrolisthesis is present at C3-4. No other significant listhesis is present. Cervical lordosis is preserved. 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 Fusion extends to T1. Marrow signal and vertebral body heights are otherwise normal. Cord: Normal signal and morphology. Posterior Fossa, vertebral arteries,  paraspinal tissues: Prominent soft tissue pannus is present about the dens without central canal stenosis. Craniocervical junction is otherwise normal. The visualized cranial contents are normal. Flow is present in the vertebral arteries bilaterally. Disc levels: C2-3:  Asymmetric right-sided facet hypertrophy is present. No significant stenosis is present. C3-4: A shallow central disc protrusion effaces the ventral CSF. Uncovertebral spurring leads to moderate right and mild left foraminal stenosis. C4-5: A shallow central disc protrusion is present. Uncovertebral spurring leads to moderate foraminal narrowing, right greater than left. C5-6: A left paramedian disc protrusion contacts and distorts the ventral surface the cord without abnormal cord signal. Moderate foraminal narrowing is present bilaterally. C6-7: A small central disc protrusion effaces the ventral CSF. The foramina are patent bilaterally. C7-T1: Negative. IMPRESSION: 1. Multilevel spondylosis of the cervical spine as described. 2. Moderate right and mild left foraminal stenosis at C3-4. 3. Moderate foraminal narrowing bilaterally at C4-5 and C5-6. 4. A left paramedian disc protrusion at C5-6 contacts and distorts the ventral surface the cord without abnormal cord signal. 5. Small central disc protrusion at C6-7 without significant stenosis. Electronically Signed   By: Marin Roberts M.D.   On: 04/03/2022 14:24    ROS: As above Blood pressure 129/80, pulse 90, temperature 98 F (36.7 C), temperature source Oral, resp. rate 14, height 6' (1.829 m), weight 58.9 kg, SpO2 99 %. Estimated body mass index is 17.61 kg/m as calculated from the following:   Height as of this encounter: 6' (1.829 m).   Weight as of this encounter: 58.9 kg.  Physical Exam  General: An alert and pleasant 88 year old thin black male in no apparent distress.  HEENT: Normocephalic, extraocular muscles intact  Neck: Limited cervical range of motion.  Spurling's testing was negative.  Lhermitte sign was not present.  Thorax: Symmetric  Abdomen soft  Extremities: Unremarkable  Neurologic exam: The patient is alert and pleasant.  Cranial nerve II through XII were grossly normal bilaterally.  His strength is normal in his  bilateral handgrip, quadricep, gastrocnemius and dorsiflexors.  Sensory function is intact to light touch sensation all tested dermatomes bilaterally.  Imaging studies: I reviewed the patient's cervical MRI.  It has finding consistent with diffuse idiopathic skeletal hyperostosis with ventral osteophytes.  He has moderate spinal stenosis at C5-6 and C6-7.  I will also reviewed his neck CT scan.  It demonstrates ventral osteophytes as above, the largest at C4-5.  I have also reviewed the patient's barium swallow.  It demonstrates his anterior osteophyte at C4-5 with some posterior compression of the esophagus without obvious obstruction.  Assessment/Plan: Aphasia, cervical vertebral osteophyte, diffuse idiopathic skeletal hyperostosis: I have discussed the situation with the patient via his son.  I have told him I do not think that his anterior osteophyte is significant.  Furthermore I do not think that removing it would help him with dysphagia and may worsen his dysphagia.  I have answered all her questions.  Please call if I can be of further assistance.  Cristi Loron 04/04/2022, 10:50 AM

## 2022-04-04 NOTE — Progress Notes (Addendum)
PROGRESS NOTE        PATIENT DETAILS Name: [name] [name] Age: 88 y.o. Sex: male Date of Birth: Dec 08, 1933 Admit Date: 04/01/2022 Admitting Physician Briscoe Deutscher, MD RCV:[id number], Binnie Rail, MD  Brief Summary: Patient is a 88 y.o.  male with history of HTN, hypothyroidism who presented with dysphagia (several months), cough-he was found to have aspiration pneumonia.  See below for further details.  Significant events: 10/6>> admit to [facility]-aspiration pneumonia  Significant studies: 10/6>> CXR: Patchy infiltrates at bilateral lung bases. 10/6>> CT head: No acute intracranial abnormality 10/6>> CT soft tissue neck: Consistent with DISH 10/8>> MRI C-spine: Multilevel spondylosis of the cervical spine.  Significant microbiology data: 10/6>> COVID/influenza PCR: Negative 10/6>> blood culture: No growth 10/7>> sputum culture: Pseudomonas  Procedures: 10/8>> EGD: Normal esophagus/stomach  Consults: Neurosurgery GI IR  Subjective: Lying comfortably in bed-denies any chest pain or shortness of breath.  Objective: Vitals: Blood pressure 129/80, pulse 90, temperature 98 F (36.7 C), temperature source Oral, resp. rate 14, height 6' (1.829 m), weight 58.9 kg, SpO2 99 %.   Exam: Gen Exam:Alert awake-not in any distress HEENT:atraumatic, normocephalic Chest: B/L clear to auscultation anteriorly CVS:S1S2 regular Abdomen:soft non tender, non distended Extremities:no edema Neurology: Non focal Skin: no rash  Pertinent Labs/Radiology:    Latest Ref Rng & Units 04/04/2022    1:51 AM 04/03/2022    5:38 AM 04/01/2022    8:13 PM  CBC  WBC 4.0 - 10.5 K/uL 5.6  7.0  6.2   Hemoglobin 13.0 - 17.0 g/dL 9.1  8.6  75.1   Hematocrit 39.0 - 52.0 % 26.7  24.7  31.4   Platelets 150 - 400 K/uL 300  257  316     Lab Results  Component Value Date   NA 138 04/04/2022   K 3.8 04/04/2022   CL 107 04/04/2022   CO2 24 04/04/2022      Assessment/Plan: Aspiration  PNA Sputum cultures positive for Pseudomonas Switch to Zosyn  Dysphagia Etiology due to severe pharyngeal dysphagia due to DISH EGD without stricture/stenosis in esophagus Per son-worsening for the past 2-3 months-but ongoing for the past several years Discussed with the son-wants to proceed with PEG tube-patient only able to take a few sips/bites and not able to meet nutritional needs./Son/family not interested in hospice-as patient still fairly functional.  Continue dysphagia 1 diet for pleasure/comfort (no aspiration seen on MBS per SLP)  Hyponatremia Resolved with IVF Etiology likely due to dehydration/poor oral intake  DISH Appreciate neurosurgical input-no benefit from surgery.  Hypothyroidism Continue Synthroid  HTN BP stable without the use of any antihypertensives  Severe protein calorie malnutrition Underweight: Estimated body mass index is 17.61 kg/m as calculated from the following:   Height as of this encounter: 6' (1.829 m).   Weight as of this encounter: 58.9 kg.   Code status:   Code Status: Full Code   DVT Prophylaxis: heparin injection 5,000 Units Start: 04/02/22 0600   Family Communication: Son-[name] Hershman-[phone] updated on 10/9   Disposition Plan: Status is: Inpatient Remains inpatient appropriate because: Severe dysphagia-likely requires PEG tube insertion   Planned Discharge Destination:Home   Diet: Diet Order             DIET - DYS 1 Room service appropriate? Yes; Fluid consistency: Thin  Diet effective now  Antimicrobial agents: Anti-infectives (From admission, onward)    Start     Dose/Rate Route Frequency Ordered Stop   04/02/22 0600  Ampicillin-Sulbactam (UNASYN) 3 g in sodium chloride 0.9 % 100 mL IVPB        3 g 200 mL/hr over 30 Minutes Intravenous Every 12 hours 04/02/22 0516     04/01/22 2200  cefTRIAXone (ROCEPHIN) 1 g in sodium chloride 0.9 % 100 mL IVPB        1 g 200 mL/hr over 30 Minutes  Intravenous  Once 04/01/22 2146 04/01/22 2308   04/01/22 2200  azithromycin (ZITHROMAX) 500 mg in sodium chloride 0.9 % 250 mL IVPB        500 mg 250 mL/hr over 60 Minutes Intravenous  Once 04/01/22 2146 04/01/22 2358        MEDICATIONS: Scheduled Meds:  (feeding supplement) PROSource Plus  30 mL Oral BID BM   feeding supplement  237 mL Oral TID BM   heparin  5,000 Units Subcutaneous Q8H   levothyroxine  50 mcg Oral Q0600   sodium chloride flush  3 mL Intravenous Q12H   thiamine  200 mg Oral Daily   Continuous Infusions:  ampicillin-sulbactam (UNASYN) IV 3 g (04/04/22 6962)   lactated ringers Stopped (04/03/22 1801)   lactated ringers 50 mL/hr at 04/04/22 0256   PRN Meds:.acetaminophen **OR** acetaminophen, labetalol, ondansetron **OR** ondansetron (ZOFRAN) IV   I have personally reviewed following labs and imaging studies  LABORATORY DATA: CBC: Recent Labs  Lab 03/31/22 1636 04/01/22 2013 04/03/22 0538 04/04/22 0151  WBC 13.9* 6.2 7.0 5.6  NEUTROABS 12.3* 5.8  --   --   HGB 10.2* 10.7* 8.6* 9.1*  HCT 30.5* 31.4* 24.7* 26.7*  MCV 86.4 83.5 83.4 84.2  PLT 288 316 257 300    Basic Metabolic Panel: Recent Labs  Lab 03/31/22 1636 04/01/22 2013 04/02/22 0538 04/03/22 0538 04/04/22 0559  NA 129* 129* 132* 136 138  K 4.2 3.9 3.8 3.5 3.8  CL 97* 93* 99 105 107  CO2 22 22 22 23 24   GLUCOSE 91 102* 83 93 106*  BUN 65* 70* 63* 52* 44*  CREATININE 2.49* 2.15* 1.96* 1.53* 1.23  CALCIUM 9.2 9.9 9.0 9.0 9.3  MG  --   --   --  2.0  --   PHOS  --   --   --  2.7 2.9    GFR: Estimated Creatinine Clearance: 35.9 mL/min (by C-G formula based on SCr of 1.23 mg/dL).  Liver Function Tests: Recent Labs  Lab 03/31/22 1636  AST 16  ALT 9  ALKPHOS 52  BILITOT 1.0  PROT 7.1  ALBUMIN 3.1*   No results for input(s): "LIPASE", "AMYLASE" in the last 168 hours. No results for input(s): "AMMONIA" in the last 168 hours.  Coagulation Profile: No results for input(s):  "INR", "PROTIME" in the last 168 hours.  Cardiac Enzymes: No results for input(s): "CKTOTAL", "CKMB", "CKMBINDEX", "TROPONINI" in the last 168 hours.  BNP (last 3 results) No results for input(s): "PROBNP" in the last 8760 hours.  Lipid Profile: No results for input(s): "CHOL", "HDL", "LDLCALC", "TRIG", "CHOLHDL", "LDLDIRECT" in the last 72 hours.  Thyroid Function Tests: Recent Labs    04/01/22 2156  TSH 19.225*    Anemia Panel: Recent Labs    04/04/22 0151  VITAMINB12 64*    Urine analysis:    Component Value Date/Time   COLORURINE YELLOW 04/02/2022 0600   APPEARANCEUR HAZY (A) 04/02/2022 0600   LABSPEC  1.011 04/02/2022 0600   PHURINE 5.0 04/02/2022 0600   GLUCOSEU NEGATIVE 04/02/2022 0600   HGBUR SMALL (A) 04/02/2022 0600   BILIRUBINUR NEGATIVE 04/02/2022 0600   KETONESUR NEGATIVE 04/02/2022 0600   PROTEINUR NEGATIVE 04/02/2022 0600   UROBILINOGEN 0.2 12/03/2021 1921   NITRITE NEGATIVE 04/02/2022 0600   LEUKOCYTESUR MODERATE (A) 04/02/2022 0600    Sepsis Labs: Lactic Acid, Venous    Component Value Date/Time   LATICACIDVEN 1.1 04/01/2022 2305    MICROBIOLOGY: Recent Results (from the past 240 hour(s))  Resp Panel by RT-PCR (Flu A&B, Covid) Anterior Nasal Swab     Status: None   Collection Time: 04/01/22  9:56 PM   Specimen: Anterior Nasal Swab  Result Value Ref Range Status   SARS Coronavirus 2 by RT PCR NEGATIVE NEGATIVE Final    Comment: (NOTE) SARS-CoV-2 target nucleic acids are NOT DETECTED.  The SARS-CoV-2 RNA is generally detectable in upper respiratory specimens during the acute phase of infection. The lowest concentration of SARS-CoV-2 viral copies this assay can detect is 138 copies/mL. A negative result does not preclude SARS-Cov-2 infection and should not be used as the sole basis for treatment or other patient management decisions. A negative result may occur with  improper specimen collection/handling, submission of specimen other than  nasopharyngeal swab, presence of viral mutation(s) within the areas targeted by this assay, and inadequate number of viral copies(<138 copies/mL). A negative result must be combined with clinical observations, patient history, and epidemiological information. The expected result is Negative.  Fact Sheet for Patients:  BloggerCourse.com  Fact Sheet for Healthcare Providers:  SeriousBroker.it  This test is no t yet approved or cleared by the [place] FDA and  has been authorized for detection and/or diagnosis of SARS-CoV-2 by FDA under an Emergency Use Authorization (EUA). This EUA will remain  in effect (meaning this test can be used) for the duration of the COVID-19 declaration under Section 564(b)(1) of the Act, 21 U.S.C.section 360bbb-3(b)(1), unless the authorization is terminated  or revoked sooner.       Influenza A by PCR NEGATIVE NEGATIVE Final   Influenza B by PCR NEGATIVE NEGATIVE Final    Comment: (NOTE) The Xpert Xpress SARS-CoV-2/FLU/RSV plus assay is intended as an aid in the diagnosis of influenza from Nasopharyngeal swab specimens and should not be used as a sole basis for treatment. Nasal washings and aspirates are unacceptable for Xpert Xpress SARS-CoV-2/FLU/RSV testing.  Fact Sheet for Patients: BloggerCourse.com  Fact Sheet for Healthcare Providers: SeriousBroker.it  This test is not yet approved or cleared by the [place] FDA and has been authorized for detection and/or diagnosis of SARS-CoV-2 by FDA under an Emergency Use Authorization (EUA). This EUA will remain in effect (meaning this test can be used) for the duration of the COVID-19 declaration under Section 564(b)(1) of the Act, 21 U.S.C. section 360bbb-3(b)(1), unless the authorization is terminated or revoked.  Performed at [facility], [address] [place], [place] [address]   Blood culture (routine x 2)     Status: None (Preliminary result)   Collection Time: 04/01/22 10:05 PM   Specimen: BLOOD  Result Value Ref Range Status   Specimen Description   Final    BLOOD Performed at [facility], [address] [place], [place] [address]    Special Requests   Final    NONE Performed at [facility], [address] [place], [place] [address]    Culture   Final  NO GROWTH < 12 HOURS Performed at [name], [address] [place], [place] [address]    Report Status PENDING  Incomplete  Blood culture (routine x 2)     Status: None (Preliminary result)   Collection Time: 04/01/22 10:10 PM   Specimen: BLOOD  Result Value Ref Range Status   Specimen Description   Final    BLOOD Performed at [facility], [address] [place], [place] [address]    Special Requests   Final    NONE Performed at [facility], [address], [place], [place] [address]    Culture   Final    NO GROWTH < 12 HOURS Performed at [name], [address] [place], [place] [address]    Report Status PENDING  Incomplete  Expectorated Sputum Assessment w Gram Stain, Rflx to Resp Cult     Status: None   Collection Time: 04/02/22 10:00 AM   Specimen: Sputum  Result Value Ref Range Status   Specimen Description SPU  Final   Special Requests EXPECTORATED  Final   Sputum evaluation   Final    THIS SPECIMEN IS ACCEPTABLE FOR SPUTUM CULTURE Performed at [facility], [address], [place], [place] [address]    Report Status 04/02/2022 FINAL  Final  Culture, Respiratory w Gram Stain     Status: None (Preliminary result)   Collection Time: 04/02/22 10:00 AM   Specimen: Sputum  Result Value Ref Range Status   Specimen Description SPU  Final   Special Requests EXPECTORATED Reflexed from [id number]  Final   Gram Stain PENDING  Incomplete   Culture    Final    RARE PSEUDOMONAS AERUGINOSA CULTURE REINCUBATED FOR BETTER GROWTH Performed at [facility], [address], [place], [place] [address]    Report Status PENDING  Incomplete    RADIOLOGY STUDIES/RESULTS: DG Swallowing Func-Speech Pathology  Result Date: 04/04/2022 Table formatting from the original result was not included. Images from the original result were not included. Objective Swallowing Evaluation: Type of Study: MBS-Modified Barium Swallow Study  Patient Details Name: Kinney Sackmann MRN: [id number] Date of Birth: [birth year] Today's Date: 04/04/2022 Time: SLP Start Time (ACUTE ONLY): 0820 -SLP Stop Time (ACUTE ONLY): 0900 SLP Time Calculation (min) (ACUTE ONLY): 40 min Past Medical History: Past Medical History: Diagnosis Date  Back pain 10/2008  per MRI 11/06/2008 - severe spinal stenosis worse at L4-5, L3-4, with neural compression at those levels  BPH (benign prostatic hyperplasia)   Eczema   Gout   Hypertension   Hypothyroidism  Past Surgical History: Past Surgical History: Procedure Laterality Date  LAMINECTOMY  12/11/2008   Bilateral L2, L3, and L4 laminotomies, done by Dr. Lovell Sheehan HPI: Sabastien Tyler is an 88 y.o. male who presents to the emergency department with dysphagia, shortness of breath, and cough. Dx DISH, pna, likely aspiration, AKI, hyponatremia.  Prior medical history significant for hypertension, hypothyroidism, and BPH. Patient has had worsening dysphagia to both solids and liquids over the past couple months. Has had frequent cough over the same interval, and has developed progressive shortness of breath and worsening productive cough over the past few days. Chest x-ray10/6 notable for patchy infiltrates in the bases and emphysematous changes. CT neck without contrast 10/6: "Extensive anterior bulky endplate osteophytic spurring throughout the visualized cervicothoracic spine, consistent with DISH. These changes can sometimes be associated with dysphagia."  MR cervical spine  10/7: "Large anterior syndesmophytes are fused at C5, C6  and C7. Fusion extends to T1." EGD 10/6 - normal esophagus  Subjective: alert  Recommendations for follow up therapy are one component of a multi-disciplinary discharge planning process, led by the attending physician.  Recommendations may be updated based on patient status, additional functional criteria and insurance authorization. Assessment / Plan / Recommendation   04/04/2022   9:03 AM Clinical Impressions Clinical Impression Pt presents with a severe pharyngeal dysphagia related to structural deficits of anterior vertebrae (large anterior syndesmophytes and/or osteophytes per radiology).  He did not demonstrate aspiration during today's study, likely due to limited quantities consumed.  There was good arytenoid-to-base of epiglottis contact, which helped with airway protection, but the epiglottis is unable to fully close over the larynx due to mechanical obstruction from cervical vertebrae.  There is poor patency of UES.  As a result, barium  (thin and puree) sat in valleculae and hypopharynx while pt swallowed multiple times in an effort to transfer material through pharynx/esophagus.  We attempted  a reclined position - this may have helped marginally to transition thin liquids.  His son was present for exam: we reviewed video, the chronic nature of this mechanical dysphagia, the difficulty his father has had meeting his nutritional needs. He is hoping to speak to Dr. Lovell Sheehan, neurosurgery, about candidacy for repair.  He asked questions about the necessity of a feeding tube. His son verbalized understanding of his father's advanced age and how this will impact decisions about management.  For now, continue to allow purees; there is no benefit in thickening liquids - allow thin liquids per pt's preferences.  D/W Dr. Jerral Ralph. SLP will follow for plan. SLP Visit Diagnosis Dysphagia, pharyngeal phase (R13.13) Impact on safety and function Moderate  aspiration risk     04/04/2022   9:03 AM Treatment Recommendations Treatment Recommendations Therapy as outlined in treatment plan below     04/04/2022   9:03 AM Prognosis Prognosis for Safe Diet Advancement Fair   04/04/2022   9:03 AM Diet Recommendations SLP Diet Recommendations Dysphagia 1 (Puree) solids;Thin liquid Liquid Administration via Cup;Straw Medication Administration Crushed with puree Compensations --     04/04/2022   9:03 AM Other Recommendations Oral Care Recommendations Oral care BID Follow Up Recommendations Other (comment)   04/04/2022   9:03 AM Frequency and Duration  Speech Therapy Frequency (ACUTE ONLY) min 2x/week Treatment Duration 2 weeks     04/04/2022   9:03 AM Oral Phase Oral Phase Impaired Oral - Thin Straw WFL Oral - Puree [name]    04/04/2022   9:03 AM Pharyngeal Phase Pharyngeal Phase Impaired Pharyngeal- Thin Straw Reduced epiglottic inversion;Pharyngeal residue - valleculae;Pharyngeal residue - pyriform Pharyngeal- Puree Reduced epiglottic inversion;Pharyngeal residue - valleculae;Pharyngeal residue - pyriform     No data to display    Blenda Mounts Laurice 04/04/2022, 9:36 AM                     MR CERVICAL SPINE WO CONTRAST  Result Date: 04/03/2022 CLINICAL DATA:  Chronic neck pain.  DISH. EXAM: MRI CERVICAL SPINE WITHOUT CONTRAST TECHNIQUE: Multiplanar, multisequence MR imaging of the cervical spine was performed. No intravenous contrast was administered. COMPARISON:  None Available. FINDINGS: Alignment: Slight degenerative retrolisthesis is present at C3-4. No other significant listhesis is present. Cervical lordosis is preserved. 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 Fusion extends to T1. Marrow signal and vertebral body heights are otherwise normal. Cord: Normal signal and morphology. Posterior Fossa, vertebral arteries, paraspinal tissues: Prominent soft  tissue pannus is present about the dens without central canal stenosis. Craniocervical junction  is otherwise normal. The visualized cranial contents are normal. Flow is present in the vertebral arteries bilaterally. Disc levels: C2-3: Asymmetric right-sided facet hypertrophy is present. No significant stenosis is present. C3-4: A shallow central disc protrusion effaces the ventral CSF. Uncovertebral spurring leads to moderate right and mild left foraminal stenosis. C4-5: A shallow central disc protrusion is present. Uncovertebral spurring leads to moderate foraminal narrowing, right greater than left. C5-6: A left paramedian disc protrusion contacts and distorts the ventral surface the cord without abnormal cord signal. Moderate foraminal narrowing is present bilaterally. C6-7: A small central disc protrusion effaces the ventral CSF. The foramina are patent bilaterally. C7-T1: Negative. IMPRESSION: 1. Multilevel spondylosis of the cervical spine as described. 2. Moderate right and mild left foraminal stenosis at C3-4. 3. Moderate foraminal narrowing bilaterally at C4-5 and C5-6. 4. A left paramedian disc protrusion at C5-6 contacts and distorts the ventral surface the cord without abnormal cord signal. 5. Small central disc protrusion at C6-7 without significant stenosis. Electronically Signed   By: San Morelle M.D.   On: 04/03/2022 14:24     LOS: 2 days   Oren Binet, MD  [facility]    To contact the attending provider between 7A-7P or the covering provider during after hours 7P-7A, please log into the web site [email or web address] and access using universal [place] password for that web site. If you do not have the password, please call the hospital operator.  04/04/2022, 11:26 AM

## 2022-04-04 NOTE — Plan of Care (Signed)

## 2022-04-04 NOTE — Progress Notes (Signed)
Pharmacy Antibiotic Note  [name] [name] is a 88 y.o. male admitted on 04/01/2022 with aspiration pneumonia.  Pharmacy has been consulted to switcth from Unasyn to Zosyn dosing for pseudomonas growing in urine culture.  Scr has trended down to 1.23, CrCl 35.9 ml/min  Plan: Unasyn discontinued Start Zosyn 3.375 g IV (infuse over 4hr) every 8 hours Monitor clinical status, renal function and culture results daily.    Height: 6' (182.9 cm) Weight: 58.9 kg (129 lb 13.6 oz) IBW/kg (Calculated) : 77.6  Temp (24hrs), Avg:97.9 F (36.6 C), Min:97.8 F (36.6 C), Max:98 F (36.7 C)  Recent Labs  Lab 03/31/22 1636 04/01/22 2013 04/01/22 2156 04/01/22 2305 04/02/22 0538 04/03/22 0538 04/04/22 0151 04/04/22 0559  WBC 13.9* 6.2  --   --   --  7.0 5.6  --   CREATININE 2.49* 2.15*  --   --  1.96* 1.53*  --  1.23  LATICACIDVEN  --   --  1.3 1.1  --   --   --   --      Estimated Creatinine Clearance: 35.9 mL/min (by C-G formula based on SCr of 1.23 mg/dL).    No Known Allergies  Antimicrobials this admission: Unasyn 10/7 >>10/9 Ceftriaxone and azithro x1 Zoysn 10/9>>  Microbiology:  10/7 urine strep pneumo 10/7 sputum culture:  pseudomonas,  sens pending 10/6 blood cx: ngtd 10/6 RVP negative    Thank you for allowing pharmacy to be a part of this patient's care.  Nicole Cella, RPh Clinical Pharmacist [phone]  04/04/2022 12:40 PM Please check AMION for all [place] phone numbers After 10:00 PM, call [place] [phone]

## 2022-04-04 NOTE — Progress Notes (Signed)
Daily Rounding Note  04/04/2022, 11:48 AM  LOS: 2 days   SUBJECTIVE:   Chief complaint:    Dysphagia, pharyngeal.  No recent events.  Tolerating pured diet.  OBJECTIVE:         Vital signs in last 24 hours:    Temp:  [97.7 F (36.5 C)-98 F (36.7 C)] 98 F (36.7 C) (10/09 0800) Pulse Rate:  [65-90] 90 (10/09 0800) Resp:  [14-19] 14 (10/09 0321) BP: (121-152)/(50-80) 129/80 (10/09 0800) SpO2:  [97 %-100 %] 99 % (10/09 0800) Last BM Date :  (PTA) Filed Weights   04/01/22 1955 04/02/22 0441 04/03/22 0938  Weight: 60.2 kg 58.9 kg 58.9 kg   General: Thin, aged, frail appearing.  Comfortable. Did not reexamine patient   Intake/Output from previous day: 10/08 0701 - 10/09 0700 In: 506.1 [P.O.:120; I.V.:286.1; IV Piggyback:100] Out: 800 [Urine:800]  Intake/Output this shift: No intake/output data recorded.  Lab Results: Recent Labs    04/01/22 2013 04/03/22 0538 04/04/22 0151  WBC 6.2 7.0 5.6  HGB 10.7* 8.6* 9.1*  HCT 31.4* 24.7* 26.7*  PLT 316 257 300   BMET Recent Labs    04/02/22 0538 04/03/22 0538 04/04/22 0559  NA 132* 136 138  K 3.8 3.5 3.8  CL 99 105 107  CO2 22 23 24   GLUCOSE 83 93 106*  BUN 63* 52* 44*  CREATININE 1.96* 1.53* 1.23  CALCIUM 9.0 9.0 9.3   LFT No results for input(s): "PROT", "ALBUMIN", "AST", "ALT", "ALKPHOS", "BILITOT", "BILIDIR", "IBILI" in the last 72 hours. PT/INR No results for input(s): "LABPROT", "INR" in the last 72 hours. Hepatitis Panel No results for input(s): "HEPBSAG", "HCVAB", "HEPAIGM", "HEPBIGM" in the last 72 hours.  Studies/Results: DG Swallowing Func-Speech Pathology  Result Date: 04/04/2022 Table formatting from the original result was not included. Images from the original result were not included. Objective Swallowing Evaluation: Type of Study: MBS-Modified Barium Swallow Study  Patient Details Name: Zacherie Lagunes MRN: [id number] Date of Birth:  [birth year] Today's Date: 04/04/2022 Time: SLP Start Time (ACUTE ONLY): 0820 -SLP Stop Time (ACUTE ONLY): 0900 SLP Time Calculation (min) (ACUTE ONLY): 40 min Past Medical History: Past Medical History: Diagnosis Date  Back pain 10/2008  per MRI 11/06/2008 - severe spinal stenosis worse at L4-5, L3-4, with neural compression at those levels  BPH (benign prostatic hyperplasia)   Eczema   Gout   Hypertension   Hypothyroidism  Past Surgical History: Past Surgical History: Procedure Laterality Date  LAMINECTOMY  12/11/2008   Bilateral L2, L3, and L4 laminotomies, done by Dr. Arnoldo Morale HPI: Sig Clisham is an 88 y.o. male who presents to the emergency department with dysphagia, shortness of breath, and cough. Dx DISH, pna, likely aspiration, AKI, hyponatremia.  Prior medical history significant for hypertension, hypothyroidism, and BPH. Patient has had worsening dysphagia to both solids and liquids over the past couple months. Has had frequent cough over the same interval, and has developed progressive shortness of breath and worsening productive cough over the past few days. Chest x-ray10/6 notable for patchy infiltrates in the bases and emphysematous changes. CT neck without contrast 10/6: "Extensive anterior bulky endplate osteophytic spurring throughout the visualized cervicothoracic spine, consistent with DISH. 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 Fusion extends to T1." EGD 10/6 - normal esophagus  Subjective: alert  Recommendations for follow up therapy are one component of  a multi-disciplinary discharge planning process, led by the attending physician.  Recommendations may be updated based on patient status, additional functional criteria and insurance authorization. Assessment / Plan / Recommendation   04/04/2022   9:03 AM Clinical Impressions Clinical Impression Pt presents with a severe pharyngeal dysphagia related  to structural deficits of anterior vertebrae (large anterior syndesmophytes and/or osteophytes per radiology).  He did not demonstrate aspiration during today's study, likely due to limited quantities consumed.  There was good arytenoid-to-base of epiglottis contact, which helped with airway protection, but the epiglottis is unable to fully close over the larynx due to mechanical obstruction from cervical vertebrae.  There is poor patency of UES.  As a result, barium  (thin and puree) sat in valleculae and hypopharynx while pt swallowed multiple times in an effort to transfer material through pharynx/esophagus.  We attempted  a reclined position - this may have helped marginally to transition thin liquids.  His son was present for exam: we reviewed video, the chronic nature of this mechanical dysphagia, the difficulty his father has had meeting his nutritional needs. He is hoping to speak to Dr. Arnoldo Morale, neurosurgery, about candidacy for repair.  He asked questions about the necessity of a feeding tube. His son verbalized understanding of his father's advanced age and how this will impact decisions about management.  For now, continue to allow purees; there is no benefit in thickening liquids - allow thin liquids per pt's preferences.  D/W Dr. Sloan Leiter. SLP will follow for plan. SLP Visit Diagnosis Dysphagia, pharyngeal phase (R13.13) Impact on safety and function Moderate aspiration risk     04/04/2022   9:03 AM Treatment Recommendations Treatment Recommendations Therapy as outlined in treatment plan below     04/04/2022   9:03 AM Prognosis Prognosis for Safe Diet [facility]ment [facility]   04/04/2022   9:03 AM Diet Recommendations SLP Diet Recommendations Dysphagia 1 (Puree) solids;Thin liquid Liquid Administration via Cup;Straw Medication Administration Crushed with puree Compensations --     04/04/2022   9:03 AM Other Recommendations Oral Care Recommendations Oral care BID Follow Up Recommendations Other (comment)    04/04/2022   9:03 AM Frequency and Duration  Speech Therapy Frequency (ACUTE ONLY) min 2x/week Treatment Duration 2 weeks     04/04/2022   9:03 AM Oral Phase Oral Phase Impaired Oral - Thin Straw WFL Oral - Puree [facility]    04/04/2022   9:03 AM Pharyngeal Phase Pharyngeal Phase Impaired Pharyngeal- Thin Straw Reduced epiglottic inversion;Pharyngeal residue - valleculae;Pharyngeal residue - pyriform Pharyngeal- Puree Reduced epiglottic inversion;Pharyngeal residue - valleculae;Pharyngeal residue - pyriform     No data to display    [name] 04/04/2022, 9:36 AM                     MR CERVICAL SPINE WO CONTRAST  Result Date: 04/03/2022 CLINICAL DATA:  Chronic neck pain.  DISH. EXAM: MRI CERVICAL SPINE WITHOUT CONTRAST TECHNIQUE: Multiplanar, multisequence MR imaging of the cervical spine was performed. No intravenous contrast was administered. COMPARISON:  None Available. FINDINGS: Alignment: Slight degenerative retrolisthesis is present at C3-4. No other significant listhesis is present. Cervical lordosis is preserved. 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 Fusion extends to T1. Marrow signal and vertebral body heights are otherwise normal. Cord: Normal signal and morphology. Posterior Fossa, vertebral arteries, paraspinal tissues: Prominent soft tissue pannus is present about the dens without central canal stenosis. Craniocervical junction is otherwise normal. The visualized cranial contents are normal. Flow  is present in the vertebral arteries bilaterally. Disc levels: C2-3: Asymmetric right-sided facet hypertrophy is present. No significant stenosis is present. C3-4: A shallow central disc protrusion effaces the ventral CSF. Uncovertebral spurring leads to moderate right and mild left foraminal stenosis. C4-5: A shallow central disc protrusion is present. Uncovertebral spurring leads to moderate foraminal narrowing, right greater than left. C5-6: A left paramedian disc  protrusion contacts and distorts the ventral surface the cord without abnormal cord signal. Moderate foraminal narrowing is present bilaterally. C6-7: A small central disc protrusion effaces the ventral CSF. The foramina are patent bilaterally. C7-T1: Negative. IMPRESSION: 1. Multilevel spondylosis of the cervical spine as described. 2. Moderate right and mild left foraminal stenosis at C3-4. 3. Moderate foraminal narrowing bilaterally at C4-5 and C5-6. 4. A left paramedian disc protrusion at C5-6 contacts and distorts the ventral surface the cord without abnormal cord signal. 5. Small central disc protrusion at C6-7 without significant stenosis. Electronically Signed   By: San Morelle M.D.   On: 04/03/2022 14:24    ASSESMENT:   Dysphagia.  Progressive over 1 month to solids, liquids.  Associated weight loss. 04/03/22 EGD.  Study normal.  No biopsies performed.  Continued on dysphagia 1 diet.  04/04/22 MBSS: Severe pharyngeal dysphagia related to structural deficits of anterior vertebrae.  Poor patency of UES.  Cleared and allow pures but no benefit to thickening liquids. 10/9Neurosurgeryconsult:     cervical vertebral osteophyte, diffuse idiopathic skeletal                                                                              hyperostosis..  Feels that this is not going to help his dysphagia.                                                                                                                                                                                                           PLAN     Puree diet.  If unable to meet nutritional needs, and family/patient agreeable, could consider IR placement of gastric feeding tube for supplemental nutrition.  GI signing off.    Azucena Freed  04/04/2022, 11:48 AM Phone [phone]

## 2022-04-05 ENCOUNTER — Inpatient Hospital Stay (HOSPITAL_COMMUNITY): Payer: Self-pay

## 2022-04-05 ENCOUNTER — Encounter (HOSPITAL_COMMUNITY): Payer: Self-pay | Admitting: Gastroenterology

## 2022-04-05 LAB — CBC
HCT: 25.4 % — ABNORMAL LOW (ref 39.0–52.0)
Hemoglobin: 8.5 g/dL — ABNORMAL LOW (ref 13.0–17.0)
MCH: 28.4 pg (ref 26.0–34.0)
MCHC: 33.5 g/dL (ref 30.0–36.0)
MCV: 84.9 fL (ref 80.0–100.0)
Platelets: 294 10*3/uL (ref 150–400)
RBC: 2.99 MIL/uL — ABNORMAL LOW (ref 4.22–5.81)
RDW: 15.1 % (ref 11.5–15.5)
WBC: 4.4 10*3/uL (ref 4.0–10.5)
nRBC: 0 % (ref 0.0–0.2)

## 2022-04-05 LAB — PHOSPHORUS: Phosphorus: 3.2 mg/dL (ref 2.5–4.6)

## 2022-04-05 LAB — LEGIONELLA PNEUMOPHILA SEROGP 1 UR AG: L. pneumophila Serogp 1 Ur Ag: NEGATIVE

## 2022-04-05 MED ORDER — BISOPROLOL FUMARATE 5 MG PO TABS
5.0000 mg | ORAL_TABLET | Freq: Every day | ORAL | Status: DC
  Administered 2022-04-05 – 2022-04-08 (×3): 5 mg via ORAL
  Filled 2022-04-05 (×5): qty 1

## 2022-04-05 MED ORDER — FERROUS SULFATE 325 (65 FE) MG PO TABS
325.0000 mg | ORAL_TABLET | Freq: Every day | ORAL | Status: DC
  Administered 2022-04-05 – 2022-04-08 (×3): 325 mg via ORAL
  Filled 2022-04-05 (×4): qty 1

## 2022-04-05 NOTE — Plan of Care (Signed)

## 2022-04-05 NOTE — Consult Note (Signed)
Consult Note  [name] [name] [birth year]  [id number].    Requesting MD: Dr. Sloan Leiter Chief Complaint/Reason for Consult: Gastrostomy tube placement  HPI:  88 y.o. male with medical history significant for for hypertension, hypothyroidism, and BPH  who presented to ED due to worsening cough, SHOB and worsening dysphagia. Patient has history of DISH and has had dysphagia for a number of years but worsening over the past few weeks. He was found to have aspiration pneumonia and admitted to the hospitalist service at [facility] on 10/6 for further management.   Since admission he has been evaluated by GI with negative EGD. NSGY has seen in consultation in regard to his DISH. He has been evaluated by SLP and passed MBS for dysphagia 1 diet and moderate aspiration risk. Due to his mechanical difficulties he is not able to meet his nutritional needs. IR was consulted for PEG placement but unable to complete due to location of transverse colon  Surgical history includes laminectomy 2010 by Dr. Arnoldo Morale.  Denies smoking and alcohol use.  He lives with his son and prior to admission was performing ADLs. His daughter is bedside   ROS: All other systems reviewed and apart from HPI are negative  Family History  Problem Relation Age of Onset   Stroke Neg Hx    Cancer Neg Hx     Past Medical History:  Diagnosis Date   Back pain 10/2008   per MRI 11/06/2008 - severe spinal stenosis worse at L4-5, L3-4, with neural compression at those levels   BPH (benign prostatic hyperplasia)    Eczema    Gout    Hypertension    Hypothyroidism     Past Surgical History:  Procedure Laterality Date   ESOPHAGOGASTRODUODENOSCOPY N/A 04/03/2022   Procedure: ESOPHAGOGASTRODUODENOSCOPY (EGD);  Surgeon: Carol Ada, MD;  Location: [place];  Service: Gastroenterology;  Laterality: N/A;   LAMINECTOMY  12/11/2008    Bilateral L2, L3, and L4 laminotomies, done by Dr. Arnoldo Morale    Social History:  reports  that he has never smoked. He has never used smokeless tobacco. He reports that he does not drink alcohol and does not use drugs.  Allergies: No Known Allergies  Medications Prior to Admission  Medication Sig Dispense Refill   allopurinol (ZYLOPRIM) 100 MG tablet Take 1 tablet (100 mg total) by mouth once daily. 30 tablet 5   bisoprolol (ZEBETA) 5 MG tablet Take 1 tablet (5 mg total) by mouth once daily. (Patient taking differently: Take 5 mg by mouth daily as needed (For high blood pressure).) 30 tablet 0   cetirizine (ZYRTEC) 10 MG tablet Take 1 tablet (10 mg total) by mouth daily. 30 tablet 1   doxazosin (CARDURA) 4 MG tablet Take 1 tablet (4 mg total) by mouth once nightly at bedtime. 30 tablet 4   fluticasone (FLONASE) 50 MCG/ACT nasal spray Place 2 sprays into both nostrils daily. (Patient taking differently: Place 2 sprays into both nostrils daily as needed for allergies.) 16 g 1   furosemide (LASIX) 20 MG tablet Take 1 tablet (20 mg total) by mouth once daily. 30 tablet 1   Iron, Ferrous Sulfate, 325 (65 Fe) MG TABS Take 1 tablet (325 mg) by mouth every other day. (Patient taking differently: Take 325 mg by mouth daily.) 15 tablet 11   levothyroxine (SYNTHROID) 50 MCG tablet Take 1 tablet (50 mcg total) by mouth once daily before breakfast. 30 tablet 6   aspirin EC 81 MG tablet Take 1  tablet (81 mg total) by mouth daily. Swallow whole. (Patient not taking: Reported on 12/21/2021) 30 tablet 12   triamcinolone cream (KENALOG) 0.1 % Apply 1 application topically to the affected area(s) 2 (two) times daily. (Patient not taking: Reported on 04/03/2022) 30 g 0    Blood pressure (!) 174/67, pulse 70, temperature (!) 97.5 F (36.4 C), temperature source Oral, resp. rate 17, height 6' (1.829 m), weight 59.5 kg, SpO2 96 %. Physical Exam: General: pleasant, WD, male who is laying in bed in NAD HEENT: head is normocephalic, atraumatic.  Sclera are noninjected.  Pupils equal and round. EOMs intact.   Ears and nose without any masses or lesions.  Mouth is pink and moist Heart: regular, rate, and rhythm.  Normal s1,s2. No obvious murmurs, gallops, or rubs noted.  Palpable radial and pedal pulses bilaterally Lungs: CTAB, no wheezes, rhonchi, or rales noted.  Respiratory effort nonlabored Abd: soft, NT, ND, +BS, no masses, hernias, or organomegaly MSK: all 4 extremities are symmetrical with no cyanosis, clubbing, or edema. Skin: warm and dry with no masses, lesions, or rashes Neuro: Cranial nerves 2-12 grossly intact, sensation is normal throughout Psych: A&Ox3 with an appropriate affect.    Results for orders placed or performed during the hospital encounter of 04/01/22 (from the past 48 hour(s))  CBC     Status: Abnormal   Collection Time: 04/04/22  1:51 AM  Result Value Ref Range   WBC 5.6 4.0 - 10.5 K/uL   RBC 3.17 (L) 4.22 - 5.81 MIL/uL   Hemoglobin 9.1 (L) 13.0 - 17.0 g/dL   HCT 26.7 (L) 39.0 - 52.0 %   MCV 84.2 80.0 - 100.0 fL   MCH 28.7 26.0 - 34.0 pg   MCHC 34.1 30.0 - 36.0 g/dL   RDW 14.9 11.5 - 15.5 %   Platelets 300 150 - 400 K/uL   nRBC 0.0 0.0 - 0.2 %    Comment: Performed at [facility], [place] [address] [place], [place] [address]  Vitamin B12     Status: Abnormal   Collection Time: 04/04/22  1:51 AM  Result Value Ref Range   Vitamin B-12 64 (L) 180 - 914 pg/mL    Comment: (NOTE) This assay is not validated for testing neonatal or myeloproliferative syndrome specimens for Vitamin B12 levels. Performed at [facility], [place] [address] [place], [place] [id number]   Basic metabolic panel     Status: Abnormal   Collection Time: 04/04/22  5:59 AM  Result Value Ref Range   Sodium 138 135 - 145 mmol/L   Potassium 3.8 3.5 - 5.1 mmol/L   Chloride 107 98 - 111 mmol/L   CO2 24 22 - 32 mmol/L   Glucose, Bld 106 (H) 70 - 99 mg/dL    Comment: Glucose reference range applies only to samples taken after fasting for at least 8 hours.   BUN 44 (H) 8 - 23 mg/dL    Creatinine, Ser 1.23 0.61 - 1.24 mg/dL   Calcium 9.3 8.9 - 10.3 mg/dL   GFR, Estimated 57 (L) >60 mL/min    Comment: (NOTE) Calculated using the CKD-EPI Creatinine Equation (2021)    Anion gap 7 5 - 15    Comment: Performed at [facility] [address], [place], [place] [address]  Procalcitonin     Status: None   Collection Time: 04/04/22  5:59 AM  Result Value Ref Range   Procalcitonin 4.30 ng/mL    Comment:  Interpretation: PCT > 2 ng/mL: Systemic infection (sepsis) is likely, unless other causes are known. (NOTE)       Sepsis PCT Algorithm           Lower Respiratory Tract                                      Infection PCT Algorithm    ----------------------------     ----------------------------         PCT < 0.25 ng/mL                PCT < 0.10 ng/mL          Strongly encourage             Strongly discourage   discontinuation of antibiotics    initiation of antibiotics    ----------------------------     -----------------------------       PCT 0.25 - 0.50 ng/mL            PCT 0.10 - 0.25 ng/mL               OR       >80% decrease in PCT            Discourage initiation of                                            antibiotics      Encourage discontinuation           of antibiotics    ----------------------------     -----------------------------         PCT >= 0.50 ng/mL              PCT 0.26 - 0.50 ng/mL               AND       <80% decrease in PCT              Encourage initiation of                                             antibiotics       Encourage continuation           of antibiotics    ----------------------------     -----------------------------        PCT >= 0.50 ng/mL                  PCT > 0.50 ng/mL               AND         increase in PCT                  Strongly encourage                                      initiation of antibiotics    Strongly encourage escalation           of antibiotics                                      -----------------------------  PCT <= 0.25 ng/mL                                                 OR                                        > 80% decrease in PCT                                      Discontinue / Do not initiate                                             antibiotics  Performed at [facility], [place] [address], [place], [place] [address]   Phosphorus     Status: None   Collection Time: 04/04/22  5:59 AM  Result Value Ref Range   Phosphorus 2.9 2.5 - 4.6 mg/dL    Comment: Performed at [facility] [address], [place], [place] [address]  CBC     Status: Abnormal   Collection Time: 04/05/22 12:39 AM  Result Value Ref Range   WBC 4.4 4.0 - 10.5 K/uL   RBC 2.99 (L) 4.22 - 5.81 MIL/uL   Hemoglobin 8.5 (L) 13.0 - 17.0 g/dL   HCT 25.4 (L) 39.0 - 52.0 %   MCV 84.9 80.0 - 100.0 fL   MCH 28.4 26.0 - 34.0 pg   MCHC 33.5 30.0 - 36.0 g/dL   RDW 15.1 11.5 - 15.5 %   Platelets 294 150 - 400 K/uL   nRBC 0.0 0.0 - 0.2 %    Comment: Performed at [facility], [place] [address], [place], [place] [address]  Phosphorus     Status: None   Collection Time: 04/05/22 12:39 AM  Result Value Ref Range   Phosphorus 3.2 2.5 - 4.6 mg/dL    Comment: Performed at [facility] [address] [place], [place] [address]   CT ABDOMEN WO CONTRAST  Result Date: 04/05/2022 CLINICAL DATA:  Evaluate gastric anatomy prior to potential percutaneous gastrostomy tube placement. EXAM: CT ABDOMEN WITHOUT CONTRAST TECHNIQUE: Multidetector CT imaging of the abdomen was performed following the standard protocol without IV contrast. RADIATION DOSE REDUCTION: This exam was performed according to the departmental dose-optimization program which includes automated exposure control, adjustment of the mA and/or kV according to patient size and/or use of iterative reconstruction technique. COMPARISON:  Chest CT-12/10/2021 FINDINGS: Lower chest: Limited  visualization of the lower thorax demonstrates trace bilateral effusions with associated bibasilar slightly nodular consolidative airspace opacities with associated air bronchograms. Ill-defined areas of ground-glass are seen within the imaged portions of the right middle lobe and lingula. Normal heart size. Coronary artery calcifications. Trace amount of pericardial fluid, presumably physiologic. Hepatobiliary: Normal hepatic contour. Normal noncontrast appearance of the gallbladder given degree of distention. No radiopaque gallstones or biliary sludge. No ascites. Pancreas: Normal noncontrast appearance of the pancreas. Spleen: Normal noncontrast appearance of the spleen. Adrenals/Urinary Tract: Normal noncontrast appearance of the bilateral kidneys. Linear calcifications about the right renal hila  are favored to be vascular in etiology. No definite evidence of nephrolithiasis. No urinary obstruction. Normal noncontrast appearance the bilateral adrenal glands. The urinary bladder was not imaged. Stomach/Bowel: The transverse colon is interposed between the anterior aspect of the stomach and ventral abdominal wall. Ingested enteric contrast extends to the imaged distal descending colon. The cecum is noted to be located within the midline of the abdomen. Normal noncontrast appearance of the terminal ileum and appendix. No significant hiatal hernia. No pneumoperitoneum, pneumatosis or portal venous gas. Vascular/Lymphatic: Atherosclerotic plaque within a normal caliber abdominal aorta. Other: Mild diffuse body wall anasarca. Musculoskeletal: No acute or aggressive osseous abnormalities. Post L4-L5 paraspinal fusion and intervertebral disc space replacement without evidence of hardware failure or loosening. Moderate DDD is seen within the remainder of the lumbar spine, worse at T12-L1 and L2-L3 with disc space height loss, endplate irregularity and sclerosis. Bulky bridging syndesmophytes are noted about the anterior  aspects of nearly all imaged thoracic and lumbar intervertebral disc spaces. IMPRESSION: 1. Suboptimal percutaneous gastrostomy window with interposition of the transverse colon between the anterior wall of the stomach and ventral wall of the abdomen. If gastrostomy tube placement is still desired, surgical consultation is advised. If the patient is deemed a poor surgical candidate, percutaneous gastrostomy tube placement could be attempted with gastric insufflation, however would recommend either the patient receiving barium the night before the procedure (if no obstructive or ileus symptoms) or an enema on the fluoroscopy table to adequately demarcate the transverse colon. 2. Trace bilateral effusions with associated bibasilar slightly nodular airspace opacities with associated air bronchograms and scattered ill-defined areas of ground-glass, constellation of findings worrisome for multifocal infection and/or aspiration. 3. Coronary artery calcifications. Aortic Atherosclerosis (ICD10-I70.0). Electronically Signed   By: Sandi Mariscal M.D.   On: 04/05/2022 08:06   DG Swallowing Func-Speech Pathology  Result Date: 04/04/2022 Table formatting from the original result was not included. Images from the original result were not included. Objective Swallowing Evaluation: Type of Study: MBS-Modified Barium Swallow Study  Patient Details Name: [name] [name] MRN: [id number] Date of Birth: 21-Oct-1933 Today's Date: 04/04/2022 Time: SLP Start Time (ACUTE ONLY): 0820 -SLP Stop Time (ACUTE ONLY): 0900 SLP Time Calculation (min) (ACUTE ONLY): 40 min Past Medical History: Past Medical History: Diagnosis Date  Back pain 10/2008  per MRI 11/06/2008 - severe spinal stenosis worse at L4-5, L3-4, with neural compression at those levels  BPH (benign prostatic hyperplasia)   Eczema   Gout   Hypertension   Hypothyroidism  Past Surgical History: Past Surgical History: Procedure Laterality Date  LAMINECTOMY  12/11/2008   Bilateral L2, L3, and L4  laminotomies, done by Dr. Arnoldo Morale HPI: Aben Mansoor is an 88 y.o. male who presents to the emergency department with dysphagia, shortness of breath, and cough. Dx DISH, pna, likely aspiration, AKI, hyponatremia.  Prior medical history significant for hypertension, hypothyroidism, and BPH. Patient has had worsening dysphagia to both solids and liquids over the past couple months. Has had frequent cough over the same interval, and has developed progressive shortness of breath and worsening productive cough over the past few days. Chest x-ray10/6 notable for patchy infiltrates in the bases and emphysematous changes. CT neck without contrast 10/6: "Extensive anterior bulky endplate osteophytic spurring throughout the visualized cervicothoracic spine, consistent with DISH. 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 Fusion extends to T1." EGD 10/6 - normal esophagus  Subjective: alert  Recommendations for follow up therapy are one component of a multi-disciplinary discharge planning process, led by the attending physician.  Recommendations may be updated based on patient status, additional functional criteria and insurance authorization. Assessment / Plan / Recommendation   04/04/2022   9:03 AM Clinical Impressions Clinical Impression Pt presents with a severe pharyngeal dysphagia related to structural deficits of anterior vertebrae (large anterior syndesmophytes and/or osteophytes per radiology).  He did not demonstrate aspiration during today's study, likely due to limited quantities consumed.  There was good arytenoid-to-base of epiglottis contact, which helped with airway protection, but the epiglottis is unable to fully close over the larynx due to mechanical obstruction from cervical vertebrae.  There is poor patency of UES.  As a result, barium  (thin and puree) sat in valleculae and hypopharynx while pt swallowed  multiple times in an effort to transfer material through pharynx/esophagus.  We attempted  a reclined position - this may have helped marginally to transition thin liquids.  His son was present for exam: we reviewed video, the chronic nature of this mechanical dysphagia, the difficulty his father has had meeting his nutritional needs. He is hoping to speak to Dr. Lovell Sheehan, neurosurgery, about candidacy for repair.  He asked questions about the necessity of a feeding tube. His son verbalized understanding of his father's advanced age and how this will impact decisions about management.  For now, continue to allow purees; there is no benefit in thickening liquids - allow thin liquids per pt's preferences.  D/W Dr. Jerral Ralph. SLP will follow for plan. SLP Visit Diagnosis Dysphagia, pharyngeal phase (R13.13) Impact on safety and function Moderate aspiration risk     04/04/2022   9:03 AM Treatment Recommendations Treatment Recommendations Therapy as outlined in treatment plan below     04/04/2022   9:03 AM Prognosis Prognosis for Safe Diet Advancement Fair   04/04/2022   9:03 AM Diet Recommendations SLP Diet Recommendations Dysphagia 1 (Puree) solids;Thin liquid Liquid Administration via Cup;Straw Medication Administration Crushed with puree Compensations --     04/04/2022   9:03 AM Other Recommendations Oral Care Recommendations Oral care BID Follow Up Recommendations Other (comment)   04/04/2022   9:03 AM Frequency and Duration  Speech Therapy Frequency (ACUTE ONLY) min 2x/week Treatment Duration 2 weeks     04/04/2022   9:03 AM Oral Phase Oral Phase Impaired Oral - Thin Straw WFL Oral - Puree [facility]    04/04/2022   9:03 AM Pharyngeal Phase Pharyngeal Phase Impaired Pharyngeal- Thin Straw Reduced epiglottic inversion;Pharyngeal residue - valleculae;Pharyngeal residue - pyriform Pharyngeal- Puree Reduced epiglottic inversion;Pharyngeal residue - valleculae;Pharyngeal residue - pyriform     No data to display    Blenda Mounts  Laurice 04/04/2022, 9:36 AM                     MR CERVICAL SPINE WO CONTRAST  Result Date: 04/03/2022 CLINICAL DATA:  Chronic neck pain.  DISH. EXAM: MRI CERVICAL SPINE WITHOUT CONTRAST TECHNIQUE: Multiplanar, multisequence MR imaging of the cervical spine was performed. No intravenous contrast was administered. COMPARISON:  None Available. FINDINGS: Alignment: Slight degenerative retrolisthesis is present at C3-4. No other significant listhesis is present. Cervical lordosis is preserved. 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 Fusion extends to T1. Marrow signal and vertebral body heights are otherwise normal. Cord: Normal signal and morphology. Posterior Fossa, vertebral arteries, paraspinal tissues: Prominent soft tissue pannus is present about the dens without central canal stenosis. Craniocervical junction is  otherwise normal. The visualized cranial contents are normal. Flow is present in the vertebral arteries bilaterally. Disc levels: C2-3: Asymmetric right-sided facet hypertrophy is present. No significant stenosis is present. C3-4: A shallow central disc protrusion effaces the ventral CSF. Uncovertebral spurring leads to moderate right and mild left foraminal stenosis. C4-5: A shallow central disc protrusion is present. Uncovertebral spurring leads to moderate foraminal narrowing, right greater than left. C5-6: A left paramedian disc protrusion contacts and distorts the ventral surface the cord without abnormal cord signal. Moderate foraminal narrowing is present bilaterally. C6-7: A small central disc protrusion effaces the ventral CSF. The foramina are patent bilaterally. C7-T1: Negative. IMPRESSION: 1. Multilevel spondylosis of the cervical spine as described. 2. Moderate right and mild left foraminal stenosis at C3-4. 3. Moderate foraminal narrowing bilaterally at C4-5 and C5-6. 4. A left paramedian disc protrusion at C5-6 contacts and distorts the ventral surface the  cord without abnormal cord signal. 5. Small central disc protrusion at C6-7 without significant stenosis. Electronically Signed   By: San Morelle M.D.   On: 04/03/2022 14:24      Assessment/Plan Dysphagia Gastrostomy tube placement - patient has been treated and evaluated for dysphagia related to underlying diffuse idiopathic skeletal hyperostosis and deemed appropriate for gastrostomy tube placement.  - IR was consulted for PEG placement but unable to complete due to location of transverse colon - patient not on anticoagulation - discussed with patient/patient family and agreed to proceed - plan for G tube placement tomorrow OR time allowing - NPO midnight  FEN: dysphagia 1 ID: [email or web address] (PNA) VTE: heparin subq   I reviewed Consultant Neurosurgery notes, last 24 h vitals and pain scores, last 48 h intake and output, last 24 h labs and trends, and last 24 h imaging results.  Due to language barrier, an interpreter was present during the history-taking and subsequent discussion (and for part of the physical exam) with this patient.   Winferd Humphrey, [facility] 04/05/2022, 12:07 PM Please see Amion for pager number during day hours 7:00am-4:30pm

## 2022-04-05 NOTE — Progress Notes (Signed)
IR was requested for image guided G tube placement.   CT abdomen reviewed by Dr. Pascal Lux, patient anatomy suboptimal for percutaneous G tube placement, recommend surgical consult.    Ordering provider notified.   Will delete the G tube placement order.  Please call IR for questions and concerns.   Armando Gang Clarinda Obi PA-C 04/05/2022 11:36 AM

## 2022-04-05 NOTE — Progress Notes (Signed)
[employer] iPad interpreter assisted in completion of informed consent for tube placement on tomorrow, 04/06/2022. Signed copy located in patient's chart.

## 2022-04-05 NOTE — Progress Notes (Signed)
PROGRESS NOTE        PATIENT DETAILS Name: [name] [name] Age: 88 y.o. Sex: male Date of Birth: Jul 11, 1933 Admit Date: 04/01/2022 Admitting Physician Briscoe Deutscher, MD [id number], Binnie Rail, MD  Brief Summary: Patient is a 88 y.o.  male with history of HTN, hypothyroidism who presented with dysphagia (several months), cough-he was found to have aspiration pneumonia.  See below for further details.  Significant events: 10/6>> admit to [facility]-aspiration pneumonia  Significant studies: 10/6>> CXR: Patchy infiltrates at bilateral lung bases. 10/6>> CT head: No acute intracranial abnormality 10/6>> CT soft tissue neck: Consistent with DISH 10/8>> MRI C-spine: Multilevel spondylosis of the cervical spine.  Significant microbiology data: 10/6>> COVID/influenza PCR: Negative 10/6>> blood culture: No growth 10/7>> sputum culture: Pseudomonas  Procedures: 10/8>> EGD: Normal esophagus/stomach  Consults: Neurosurgery GI IR  Subjective: No major issues overnight.  Objective: Vitals: Blood pressure (!) 174/67, pulse 70, temperature (!) 97.5 F (36.4 C), temperature source Oral, resp. rate 17, height 6' (1.829 m), weight 59.5 kg, SpO2 96 %.   Exam: Gen Exam:Alert awake-not in any distress HEENT:atraumatic, normocephalic Chest: B/L clear to auscultation anteriorly CVS:S1S2 regular Abdomen:soft non tender, non distended Extremities:no edema Neurology: Non focal Skin: no rash   Pertinent Labs/Radiology:    Latest Ref Rng & Units 04/05/2022   12:39 AM 04/04/2022    1:51 AM 04/03/2022    5:38 AM  CBC  WBC 4.0 - 10.5 K/uL 4.4  5.6  7.0   Hemoglobin 13.0 - 17.0 g/dL 8.5  9.1  8.6   Hematocrit 39.0 - 52.0 % 25.4  26.7  24.7   Platelets 150 - 400 K/uL 294  300  257     Lab Results  Component Value Date   NA 138 04/04/2022   K 3.8 04/04/2022   CL 107 04/04/2022   CO2 24 04/04/2022      Assessment/Plan: Aspiration PNA Sputum cultures positive for  Pseudomonas-awaiting sensitivity Switched to Zosyn on 10/9.   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  Continue dysphagia 1 diet for pleasure/comfort (no aspiration seen on MBS per SLP) CT abdomen done earlier this morning reviewed-we will discuss with IR-May need [facility]CS consult for open gastrostomy tube placement.  Hyponatremia Resolved with IVF Etiology likely due to dehydration/poor oral intake  DISH Appreciate neurosurgical input-no benefit from surgery.  Hypothyroidism Continue Synthroid  HTN BP now creeping up-resume bisoprolol today, if needed will resume Cardura as well  Normocytic anemia Iron deficiency Hemoglobin slowly creeping down-I suspect this is from acute illness/IV fluids. No evidence of GI bleeding-EGD negative. Resume oral iron supplementation  Severe protein calorie malnutrition Underweight: Estimated body mass index is 17.79 kg/m as calculated from the following:   Height as of this encounter: 6' (1.829 m).   Weight as of this encounter: 59.5 kg.   Code status:   Code Status: Full Code   DVT Prophylaxis: heparin injection 5,000 Units Start: 04/02/22 0600   Family Communication: Son-[name] [phone] updated on 10/9   Disposition Plan: Status is: Inpatient Remains inpatient appropriate because:  Severe dysphagia-likely requires PEG tube insertion   Planned Discharge Destination:Home   Diet: Diet Order             DIET - DYS 1 Room service appropriate? No; Fluid consistency: Thin  Diet effective now                     Antimicrobial agents: Anti-infectives (From admission,  onward)    Start     Dose/Rate Route Frequency Ordered Stop   04/04/22 1330  piperacillin-tazobactam (ZOSYN) IVPB 3.375 g        3.375 g 12.5 mL/hr over 240 Minutes Intravenous Every 8 hours 04/04/22 1240     04/02/22 0600  Ampicillin-Sulbactam (UNASYN) 3 g in sodium chloride 0.9 % 100 mL IVPB  Status:  Discontinued        3 g 200 mL/hr over 30 Minutes Intravenous Every 12 hours 04/02/22 0516 04/04/22 1148   04/01/22 2200  cefTRIAXone (ROCEPHIN) 1 g in sodium chloride 0.9 % 100 mL IVPB        1 g 200 mL/hr over 30 Minutes Intravenous  Once 04/01/22 2146 04/01/22 2308   04/01/22 2200  azithromycin (ZITHROMAX) 500 mg in sodium chloride 0.9 % 250 mL IVPB        500 mg 250 mL/hr over 60 Minutes Intravenous  Once 04/01/22 2146 04/01/22 2358        MEDICATIONS: Scheduled Meds:  (feeding supplement) PROSource Plus  30 mL Oral BID BM   feeding supplement  237 mL Oral TID BM   heparin  5,000 Units Subcutaneous Q8H   levothyroxine  50 mcg Oral Q0600   sodium chloride flush  3 mL Intravenous Q12H   thiamine  200 mg Oral Daily   Continuous Infusions:  lactated ringers 50 mL/hr at 04/04/22 0256   piperacillin-tazobactam (ZOSYN)  IV 3.375 g (04/05/22 0817)   PRN Meds:.acetaminophen **OR** acetaminophen, labetalol, ondansetron **OR** ondansetron (ZOFRAN) IV   I have personally reviewed following labs and imaging studies  LABORATORY DATA: CBC: Recent Labs  Lab 03/31/22 1636 04/01/22 2013 04/03/22 0538 04/04/22 0151 04/05/22 0039  WBC 13.9* 6.2 7.0 5.6 4.4  NEUTROABS 12.3* 5.8  --   --   --   HGB 10.2* 10.7* 8.6* 9.1* 8.5*  HCT 30.5* 31.4* 24.7* 26.7* 25.4*  MCV 86.4 83.5 83.4 84.2 84.9  PLT 288 316 257 300 294     Basic Metabolic Panel: Recent Labs  Lab 03/31/22 1636 04/01/22 2013 04/02/22 0538 04/03/22 0538 04/04/22 0559 04/05/22 0039  NA 129* 129* 132* 136 138  --   K 4.2 3.9 3.8 3.5 3.8  --   CL 97* 93* 99 105 107  --   CO2 22 22 22 23 24   --   GLUCOSE 91 102*  83 93 106*  --   BUN 65* 70* 63* 52* 44*  --   CREATININE 2.49* 2.15* 1.96* 1.53* 1.23  --   CALCIUM 9.2 9.9 9.0 9.0 9.3  --   MG  --   --   --  2.0  --   --   PHOS  --   --   --  2.7 2.9 3.2     GFR: Estimated Creatinine Clearance: 36.3 mL/min (by C-G formula based on SCr of 1.23 mg/dL).  Liver Function Tests: Recent Labs  Lab 03/31/22 1636  AST 16  ALT 9  ALKPHOS 52  BILITOT 1.0  PROT 7.1  ALBUMIN 3.1*    No results for input(s): "  LIPASE", "AMYLASE" in the last 168 hours. No results for input(s): "AMMONIA" in the last 168 hours.  Coagulation Profile: No results for input(s): "INR", "PROTIME" in the last 168 hours.  Cardiac Enzymes: No results for input(s): "CKTOTAL", "CKMB", "CKMBINDEX", "TROPONINI" in the last 168 hours.  BNP (last 3 results) No results for input(s): "PROBNP" in the last 8760 hours.  Lipid Profile: No results for input(s): "CHOL", "HDL", "LDLCALC", "TRIG", "CHOLHDL", "LDLDIRECT" in the last 72 hours.  Thyroid Function Tests: No results for input(s): "TSH", "T4TOTAL", "FREET4", "T3FREE", "THYROIDAB" in the last 72 hours.   Anemia Panel: Recent Labs    04/04/22 0151  VITAMINB12 64*     Urine analysis:    Component Value Date/Time   COLORURINE YELLOW 04/02/2022 0600   APPEARANCEUR HAZY (A) 04/02/2022 0600   LABSPEC 1.011 04/02/2022 0600   PHURINE 5.0 04/02/2022 0600   GLUCOSEU NEGATIVE 04/02/2022 0600   HGBUR SMALL (A) 04/02/2022 0600   BILIRUBINUR NEGATIVE 04/02/2022 0600   KETONESUR NEGATIVE 04/02/2022 0600   PROTEINUR NEGATIVE 04/02/2022 0600   UROBILINOGEN 0.2 12/03/2021 1921   NITRITE NEGATIVE 04/02/2022 0600   LEUKOCYTESUR MODERATE (A) 04/02/2022 0600    Sepsis Labs: Lactic Acid, Venous    Component Value Date/Time   LATICACIDVEN 1.1 04/01/2022 2305    MICROBIOLOGY: Recent Results (from the past 240 hour(s))  Resp Panel by RT-PCR (Flu A&B, Covid) Anterior Nasal Swab     Status: None   Collection Time: 04/01/22  9:56  PM   Specimen: Anterior Nasal Swab  Result Value Ref Range Status   SARS Coronavirus 2 by RT PCR NEGATIVE NEGATIVE Final    Comment: (NOTE) SARS-CoV-2 target nucleic acids are NOT DETECTED.  The SARS-CoV-2 RNA is generally detectable in upper respiratory specimens during the acute phase of infection. The lowest concentration of SARS-CoV-2 viral copies this assay can detect is 138 copies/mL. A negative result does not preclude SARS-Cov-2 infection and should not be used as the sole basis for treatment or other patient management decisions. A negative result may occur with  improper specimen collection/handling, submission of specimen other than nasopharyngeal swab, presence of viral mutation(s) within the areas targeted by this assay, and inadequate number of viral copies(<138 copies/mL). A negative result must be combined with clinical observations, patient history, and epidemiological information. The expected result is Negative.  Fact Sheet for Patients:  BloggerCourse.com  Fact Sheet for Healthcare Providers:  SeriousBroker.it  This test is no t yet approved or cleared by the [place] FDA and  has been authorized for detection and/or diagnosis of SARS-CoV-2 by FDA under an Emergency Use Authorization (EUA). This EUA will remain  in effect (meaning this test can be used) for the duration of the COVID-19 declaration under Section 564(b)(1) of the Act, 21 U.S.C.section 360bbb-3(b)(1), unless the authorization is terminated  or revoked sooner.       Influenza A by PCR NEGATIVE NEGATIVE Final   Influenza B by PCR NEGATIVE NEGATIVE Final    Comment: (NOTE) The Xpert Xpress SARS-CoV-2/FLU/RSV plus assay is intended as an aid in the diagnosis of influenza from Nasopharyngeal swab specimens and should not be used as a sole basis for treatment. Nasal washings and aspirates are unacceptable for Xpert Xpress  SARS-CoV-2/FLU/RSV testing.  Fact Sheet for Patients: BloggerCourse.com  Fact Sheet for Healthcare Providers: SeriousBroker.it  This test is not yet approved or cleared by the [place] FDA and has been authorized for detection and/or diagnosis of SARS-CoV-2 by FDA under an Emergency Use  Authorization (EUA). This EUA will remain in effect (meaning this test can be used) for the duration of the COVID-19 declaration under Section 564(b)(1) of the Act, 21 U.S.C. section 360bbb-3(b)(1), unless the authorization is terminated or revoked.  Performed at [facility], [address], [place], [place] [address]   Blood culture (routine x 2)     Status: None (Preliminary result)   Collection Time: 04/01/22 10:05 PM   Specimen: BLOOD  Result Value Ref Range Status   Specimen Description   Final    BLOOD Performed at [facility], [address] [place], [place] [address]    Special Requests   Final    NONE Performed at [facility], [address], [place], [place] [address]    Culture   Final    NO GROWTH 2 DAYS Performed at [facility], [address], [place], [place] [address]    Report Status PENDING  Incomplete  Blood culture (routine x 2)     Status: None (Preliminary result)   Collection Time: 04/01/22 10:10 PM   Specimen: BLOOD  Result Value Ref Range Status   Specimen Description   Final    BLOOD Performed at [facility], [address] [place], [place] [address]    Special Requests   Final    NONE Performed at [facility], [address] [place], [place] [address]    Culture   Final    NO GROWTH 2 DAYS Performed at [facility], [address], [place], [place] [address]    Report Status PENDING  Incomplete  Expectorated Sputum Assessment w Gram Stain, Rflx to Resp Cult     Status: None    Collection Time: 04/02/22 10:00 AM   Specimen: Sputum  Result Value Ref Range Status   Specimen Description SPU  Final   Special Requests EXPECTORATED  Final   Sputum evaluation   Final    THIS SPECIMEN IS ACCEPTABLE FOR SPUTUM CULTURE Performed at [facility], [address] [place], [place] [address]    Report Status 04/02/2022 FINAL  Final  Culture, Respiratory w Gram Stain     Status: None (Preliminary result)   Collection Time: 04/02/22 10:00 AM   Specimen: Sputum  Result Value Ref Range Status   Specimen Description SPU  Final   Special Requests EXPECTORATED Reflexed from [id number]  Final   Gram Stain   Final    RARE WBC PRESENT, PREDOMINANTLY PMN FEW GRAM NEGATIVE RODS RARE GRAM POSITIVE COCCI    Culture   Final    FEW PSEUDOMONAS AERUGINOSA SUSCEPTIBILITIES TO FOLLOW WITHIN MIXED ORGANISMS Performed at [facility], [address], [place], [place] [address]    Report Status PENDING  Incomplete    RADIOLOGY STUDIES/RESULTS: CT ABDOMEN WO CONTRAST  Result Date: 04/05/2022 CLINICAL DATA:  Evaluate gastric anatomy prior to potential percutaneous gastrostomy tube placement. EXAM: CT ABDOMEN WITHOUT CONTRAST TECHNIQUE: Multidetector CT imaging of the abdomen was performed following the standard protocol without IV contrast. RADIATION DOSE REDUCTION: This exam was performed according to the departmental dose-optimization program which includes automated exposure control, adjustment of the mA and/or kV according to patient size and/or use of iterative reconstruction technique. COMPARISON:  Chest CT-12/10/2021 FINDINGS: Lower chest: Limited visualization of the lower thorax demonstrates trace bilateral effusions with associated bibasilar slightly nodular consolidative airspace opacities with associated air bronchograms. Ill-defined areas of ground-glass are seen within the imaged portions of the right  middle lobe and lingula. Normal heart size. Coronary artery  calcifications. Trace amount of pericardial fluid, presumably physiologic. Hepatobiliary: Normal hepatic contour. Normal noncontrast appearance of the gallbladder given degree of distention. No radiopaque gallstones or biliary sludge. No ascites. Pancreas: Normal noncontrast appearance of the pancreas. Spleen: Normal noncontrast appearance of the spleen. Adrenals/Urinary Tract: Normal noncontrast appearance of the bilateral kidneys. Linear calcifications about the right renal hila are favored to be vascular in etiology. No definite evidence of nephrolithiasis. No urinary obstruction. Normal noncontrast appearance the bilateral adrenal glands. The urinary bladder was not imaged. Stomach/Bowel: The transverse colon is interposed between the anterior aspect of the stomach and ventral abdominal wall. Ingested enteric contrast extends to the imaged distal descending colon. The cecum is noted to be located within the midline of the abdomen. Normal noncontrast appearance of the terminal ileum and appendix. No significant hiatal hernia. No pneumoperitoneum, pneumatosis or portal venous gas. Vascular/Lymphatic: Atherosclerotic plaque within a normal caliber abdominal aorta. Other: Mild diffuse body wall anasarca. Musculoskeletal: No acute or aggressive osseous abnormalities. Post L4-L5 paraspinal fusion and intervertebral disc space replacement without evidence of hardware failure or loosening. Moderate DDD is seen within the remainder of the lumbar spine, worse at T12-L1 and L2-L3 with disc space height loss, endplate irregularity and sclerosis. Bulky bridging syndesmophytes are noted about the anterior aspects of nearly all imaged thoracic and lumbar intervertebral disc spaces. IMPRESSION: 1. Suboptimal percutaneous gastrostomy window with interposition of the transverse colon between the anterior wall of the stomach and ventral wall of the abdomen. If gastrostomy tube placement is still desired, surgical consultation is  advised. If the patient is deemed a poor surgical candidate, percutaneous gastrostomy tube placement could be attempted with gastric insufflation, however would recommend either the patient receiving barium the night before the procedure (if no obstructive or ileus symptoms) or an enema on the fluoroscopy table to adequately demarcate the transverse colon. 2. Trace bilateral effusions with associated bibasilar slightly nodular airspace opacities with associated air bronchograms and scattered ill-defined areas of ground-glass, constellation of findings worrisome for multifocal infection and/or aspiration. 3. Coronary artery calcifications. Aortic Atherosclerosis (ICD10-I70.0). Electronically Signed   By: Simonne Come M.D.   On: 04/05/2022 08:06   DG Swallowing Func-Speech Pathology  Result Date: 04/04/2022 Table formatting from the original result was not included. Images from the original result were not included. Objective Swallowing Evaluation: Type of Study: MBS-Modified Barium Swallow Study  Patient Details Name: Thailan Sava MRN: [id number] Date of Birth: December 11, 1933 Today's Date: 04/04/2022 Time: SLP Start Time (ACUTE ONLY): 0820 -SLP Stop Time (ACUTE ONLY): 0900 SLP Time Calculation (min) (ACUTE ONLY): 40 min Past Medical History: Past Medical History: Diagnosis Date  Back pain 10/2008  per MRI 11/06/2008 - severe spinal stenosis worse at L4-5, L3-4, with neural compression at those levels  BPH (benign prostatic hyperplasia)   Eczema   Gout   Hypertension   Hypothyroidism  Past Surgical History: Past Surgical History: Procedure Laterality Date  LAMINECTOMY  12/11/2008   Bilateral L2, L3, and L4 laminotomies, done by Dr. Lovell Sheehan HPI: Dow Blahnik is an 88 y.o. male who presents to the emergency department with dysphagia, shortness of breath, and cough. Dx DISH, pna, likely aspiration, AKI, hyponatremia.  Prior medical history significant for hypertension, hypothyroidism, and BPH. Patient has had worsening dysphagia to  both solids and liquids over the past couple months. Has had frequent cough over the same interval, and has developed progressive shortness of breath and worsening productive cough over the  past few days. Chest x-ray10/6 notable for patchy infiltrates in the bases and emphysematous changes. CT neck without contrast 10/6: "Extensive anterior bulky endplate osteophytic spurring throughout the visualized cervicothoracic spine, consistent with DISH. 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 Fusion extends to T1." EGD 10/6 - normal esophagus  Subjective: alert  Recommendations for follow up therapy are one component of a multi-disciplinary discharge planning process, led by the attending physician.  Recommendations may be updated based on patient status, additional functional criteria and insurance authorization. Assessment / Plan / Recommendation   04/04/2022   9:03 AM Clinical Impressions Clinical Impression Pt presents with a severe pharyngeal dysphagia related to structural deficits of anterior vertebrae (large anterior syndesmophytes and/or osteophytes per radiology).  He did not demonstrate aspiration during today's study, likely due to limited quantities consumed.  There was good arytenoid-to-base of epiglottis contact, which helped with airway protection, but the epiglottis is unable to fully close over the larynx due to mechanical obstruction from cervical vertebrae.  There is poor patency of UES.  As a result, barium  (thin and puree) sat in valleculae and hypopharynx while pt swallowed multiple times in an effort to transfer material through pharynx/esophagus.  We attempted  a reclined position - this may have helped marginally to transition thin liquids.  His son was present for exam: we reviewed video, the chronic nature of this mechanical dysphagia, the difficulty his father has had meeting his nutritional  needs. He is hoping to speak to Dr. Arnoldo Morale, neurosurgery, about candidacy for repair.  He asked questions about the necessity of a feeding tube. His son verbalized understanding of his father's advanced age and how this will impact decisions about management.  For now, continue to allow purees; there is no benefit in thickening liquids - allow thin liquids per pt's preferences.  D/W Dr. Sloan Leiter. SLP will follow for plan. SLP Visit Diagnosis Dysphagia, pharyngeal phase (R13.13) Impact on safety and function Moderate aspiration risk     04/04/2022   9:03 AM Treatment Recommendations Treatment Recommendations Therapy as outlined in treatment plan below     04/04/2022   9:03 AM Prognosis Prognosis for Safe Diet [facility]   04/04/2022   9:03 AM Diet Recommendations SLP Diet Recommendations Dysphagia 1 (Puree) solids;Thin liquid Liquid Administration via Cup;Straw Medication Administration Crushed with puree Compensations --     04/04/2022   9:03 AM Other Recommendations Oral Care Recommendations Oral care BID Follow Up Recommendations Other (comment)   04/04/2022   9:03 AM Frequency and Duration  Speech Therapy Frequency (ACUTE ONLY) min 2x/week Treatment Duration 2 weeks     04/04/2022   9:03 AM Oral Phase Oral Phase Impaired Oral - Thin Straw WFL Oral - Puree [facility]    04/04/2022   9:03 AM Pharyngeal Phase Pharyngeal Phase Impaired Pharyngeal- Thin Straw Reduced epiglottic inversion;Pharyngeal residue - valleculae;Pharyngeal residue - pyriform Pharyngeal- Puree Reduced epiglottic inversion;Pharyngeal residue - valleculae;Pharyngeal residue - pyriform     No data to display    [name] 04/04/2022, 9:36 AM                     MR CERVICAL SPINE WO CONTRAST  Result Date: 04/03/2022 CLINICAL DATA:  Chronic neck pain.  DISH. EXAM: MRI CERVICAL SPINE WITHOUT CONTRAST TECHNIQUE: Multiplanar, multisequence MR imaging of the cervical spine was performed. No intravenous contrast was administered. COMPARISON:  None  Available.  FINDINGS: Alignment: Slight degenerative retrolisthesis is present at C3-4. No other significant listhesis is present. Cervical lordosis is preserved. 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 Fusion extends to T1. Marrow signal and vertebral body heights are otherwise normal. Cord: Normal signal and morphology. Posterior Fossa, vertebral arteries, paraspinal tissues: Prominent soft tissue pannus is present about the dens without central canal stenosis. Craniocervical junction is otherwise normal. The visualized cranial contents are normal. Flow is present in the vertebral arteries bilaterally. Disc levels: C2-3: Asymmetric right-sided facet hypertrophy is present. No significant stenosis is present. C3-4: A shallow central disc protrusion effaces the ventral CSF. Uncovertebral spurring leads to moderate right and mild left foraminal stenosis. C4-5: A shallow central disc protrusion is present. Uncovertebral spurring leads to moderate foraminal narrowing, right greater than left. C5-6: A left paramedian disc protrusion contacts and distorts the ventral surface the cord without abnormal cord signal. Moderate foraminal narrowing is present bilaterally. C6-7: A small central disc protrusion effaces the ventral CSF. The foramina are patent bilaterally. C7-T1: Negative. IMPRESSION: 1. Multilevel spondylosis of the cervical spine as described. 2. Moderate right and mild left foraminal stenosis at C3-4. 3. Moderate foraminal narrowing bilaterally at C4-5 and C5-6. 4. A left paramedian disc protrusion at C5-6 contacts and distorts the ventral surface the cord without abnormal cord signal. 5. Small central disc protrusion at C6-7 without significant stenosis. Electronically Signed   By: Marin Roberts M.D.   On: 04/03/2022 14:24     LOS: 3 days   Jeoffrey Massed, MD  [facility]    To contact the attending provider between 7A-7P or the covering provider during after  hours 7P-7A, please log into the web site [email or web address] and access using universal [place] password for that web site. If you do not have the password, please call the hospital operator.  04/05/2022, 11:34 AM

## 2022-04-05 NOTE — Progress Notes (Addendum)
Speech Language Pathology Treatment: Dysphagia  Patient Details Name: [name] [name] MRN: [id number] DOB: Jul 20, 1933 Today's Date: 04/05/2022 Time: 0254-2706 SLP Time Calculation (min) (ACUTE ONLY): 12 min  Assessment / Plan / Recommendation Clinical Impression  F/u after yesterday's MBS. Pt's dtr-in-law, Souzan, was at the bedside. She reported that he only had a few bites of breakfast before declining further POs. Similarly, he drank only limited thin liquid per my observation before declining further POs. We reviewed the results of the exam and the impact of osteophytes on his swallowing.  Per Dr. Sloan Leiter, the family wants to proceed with PEG for primary nutrition. Discussed with Souzan that Mr.Sisney should continue soft foods and liquids as he desires or is able. He did not aspirate during the MBS, but with larger quantities of food/liquid he remains at risk for doing so.   She verbalizes understanding ,as does the pt, for whom she translated.  No further SLP f/u is needed - our service will sign off.    HPI HPI: [name] [name] is an 88 y.o. male who presents to the emergency department with dysphagia, shortness of breath, and cough. Dx DISH, pna, likely aspiration, AKI, hyponatremia.  Prior medical history significant for hypertension, hypothyroidism, and BPH. Patient has had worsening dysphagia to both solids and liquids over the past couple months. Has had frequent cough over the same interval, and has developed progressive shortness of breath and worsening productive cough over the past few days. Chest x-ray10/6 notable for patchy infiltrates in the bases and emphysematous changes. CT neck without contrast 10/6: "Extensive anterior bulky endplate osteophytic spurring throughout the visualized cervicothoracic spine, consistent with DISH. 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 Fusion extends to T1." EGD  10/6 - normal esophagus      SLP Plan  All goals met Pt may eat soft foods and drink thin liquids for pleasure/comfort.     Recommendations for follow up therapy are one component of a multi-disciplinary discharge planning process, led by the attending physician.  Recommendations may be updated based on patient status, additional functional criteria and insurance authorization.    Recommendations  Diet recommendations: Thin liquid;Dysphagia 1 (puree) Liquids provided via: Cup;Straw Medication Administration: Crushed with puree Supervision: Patient able to self feed Compensations: Small sips/bites                Oral Care Recommendations: Oral care BID Follow Up Recommendations: No SLP follow up SLP Visit Diagnosis: Dysphagia, pharyngeal phase (R13.13) Plan: All goals met         Adriannah Steinkamp L. Tivis Ringer, MA CCC/SLP Clinical Specialist - Acute Care SLP Acute Rehabilitation Services Office number [phone]   [name]  04/05/2022, 9:55 AM

## 2022-04-05 NOTE — Plan of Care (Signed)
?  Problem: Education: ?Goal: Knowledge of General Education information will improve ?Description: Including pain rating scale, medication(s)/side effects and non-pharmacologic comfort measures ?Outcome: Not Progressing ?  ?Problem: Health Behavior/Discharge Planning: ?Goal: Ability to manage health-related needs will improve ?Outcome: Not Progressing ?  ?Problem: Clinical Measurements: ?Goal: Ability to maintain clinical measurements within normal limits will improve ?Outcome: Not Progressing ?Goal: Will remain free from infection ?Outcome: Not Progressing ?Goal: Diagnostic test results will improve ?Outcome: Not Progressing ?Goal: Respiratory complications will improve ?Outcome: Not Progressing ?Goal: Cardiovascular complication will be avoided ?Outcome: Not Progressing ?  ?Problem: Nutrition: ?Goal: Adequate nutrition will be maintained ?Outcome: Not Progressing ?  ?Problem: Coping: ?Goal: Level of anxiety will decrease ?Outcome: Not Progressing ?  ?Problem: Elimination: ?Goal: Will not experience complications related to bowel motility ?Outcome: Not Progressing ?Goal: Will not experience complications related to urinary retention ?Outcome: Not Progressing ?  ?Problem: Pain Managment: ?Goal: General experience of comfort will improve ?Outcome: Not Progressing ?  ?

## 2022-04-06 ENCOUNTER — Encounter (HOSPITAL_COMMUNITY): Payer: Self-pay | Admitting: Family Medicine

## 2022-04-06 ENCOUNTER — Encounter (HOSPITAL_COMMUNITY)
Admission: EM | Disposition: A | Discharge status: Home / Self Care | Payer: Self-pay | Source: Home / Self Care | Attending: Internal Medicine

## 2022-04-06 ENCOUNTER — Inpatient Hospital Stay (HOSPITAL_COMMUNITY): Payer: Self-pay | Admitting: Anesthesiology

## 2022-04-06 ENCOUNTER — Other Ambulatory Visit: Payer: Self-pay

## 2022-04-06 DIAGNOSIS — E039 Hypothyroidism, unspecified: Secondary | ICD-10-CM

## 2022-04-06 DIAGNOSIS — D638 Anemia in other chronic diseases classified elsewhere: Secondary | ICD-10-CM

## 2022-04-06 DIAGNOSIS — R1310 Dysphagia, unspecified: Secondary | ICD-10-CM

## 2022-04-06 DIAGNOSIS — R1312 Dysphagia, oropharyngeal phase: Secondary | ICD-10-CM

## 2022-04-06 DIAGNOSIS — I10 Essential (primary) hypertension: Secondary | ICD-10-CM

## 2022-04-06 HISTORY — PX: LAPAROSCOPY: SHX197

## 2022-04-06 LAB — CULTURE, RESPIRATORY W GRAM STAIN

## 2022-04-06 LAB — PHOSPHORUS: Phosphorus: 3 mg/dL (ref 2.5–4.6)

## 2022-04-06 SURGERY — LAPAROSCOPY, DIAGNOSTIC
Anesthesia: General | Site: Abdomen

## 2022-04-06 MED ORDER — LACTATED RINGERS IV SOLN: INTRAVENOUS | Status: DC

## 2022-04-06 MED ORDER — FENTANYL CITRATE (PF) 250 MCG/5ML IJ SOLN
INTRAMUSCULAR | Status: DC | PRN
  Administered 2022-04-06: 50 ug via INTRAVENOUS

## 2022-04-06 MED ORDER — BUPIVACAINE HCL 0.25 % IJ SOLN
INTRAMUSCULAR | Status: DC | PRN
  Administered 2022-04-06: 5 mL

## 2022-04-06 MED ORDER — PROMETHAZINE HCL 25 MG/ML IJ SOLN: 6.2500 mg | INTRAMUSCULAR | Status: DC | PRN

## 2022-04-06 MED ORDER — ONDANSETRON HCL 4 MG/2ML IJ SOLN
INTRAMUSCULAR | Status: DC | PRN
  Administered 2022-04-06: 4 mg via INTRAVENOUS

## 2022-04-06 MED ORDER — LIDOCAINE 2% (20 MG/ML) 5 ML SYRINGE
INTRAMUSCULAR | Status: AC
  Filled 2022-04-06: qty 5

## 2022-04-06 MED ORDER — PHENYLEPHRINE 80 MCG/ML (10ML) SYRINGE FOR IV PUSH (FOR BLOOD PRESSURE SUPPORT)
PREFILLED_SYRINGE | INTRAVENOUS | Status: DC | PRN
  Administered 2022-04-06 (×2): 240 ug via INTRAVENOUS

## 2022-04-06 MED ORDER — ONDANSETRON HCL 4 MG/2ML IJ SOLN
INTRAMUSCULAR | Status: AC
  Filled 2022-04-06: qty 2

## 2022-04-06 MED ORDER — BUPIVACAINE-EPINEPHRINE (PF) 0.25% -1:200000 IJ SOLN
INTRAMUSCULAR | Status: AC
  Filled 2022-04-06: qty 30

## 2022-04-06 MED ORDER — SUCCINYLCHOLINE CHLORIDE 200 MG/10ML IV SOSY
PREFILLED_SYRINGE | INTRAVENOUS | Status: DC | PRN
  Administered 2022-04-06: 100 mg via INTRAVENOUS

## 2022-04-06 MED ORDER — LIDOCAINE 2% (20 MG/ML) 5 ML SYRINGE
INTRAMUSCULAR | Status: DC | PRN
  Administered 2022-04-06: 60 mg via INTRAVENOUS

## 2022-04-06 MED ORDER — ALBUMIN HUMAN 5 % IV SOLN: INTRAVENOUS | Status: DC | PRN

## 2022-04-06 MED ORDER — CHLORHEXIDINE GLUCONATE 0.12 % MT SOLN
OROMUCOSAL | Status: AC
  Administered 2022-04-06: 15 mL via OROMUCOSAL
  Filled 2022-04-06: qty 15

## 2022-04-06 MED ORDER — ROCURONIUM BROMIDE 10 MG/ML (PF) SYRINGE
PREFILLED_SYRINGE | INTRAVENOUS | Status: AC
  Filled 2022-04-06: qty 10

## 2022-04-06 MED ORDER — SUCCINYLCHOLINE CHLORIDE 200 MG/10ML IV SOSY
PREFILLED_SYRINGE | INTRAVENOUS | Status: AC
  Filled 2022-04-06: qty 10

## 2022-04-06 MED ORDER — EPHEDRINE SULFATE-NACL 50-0.9 MG/10ML-% IV SOSY
PREFILLED_SYRINGE | INTRAVENOUS | Status: DC | PRN
  Administered 2022-04-06: 5 mg via INTRAVENOUS

## 2022-04-06 MED ORDER — FENTANYL CITRATE (PF) 100 MCG/2ML IJ SOLN: 25.0000 ug | INTRAMUSCULAR | Status: DC | PRN

## 2022-04-06 MED ORDER — CHLORHEXIDINE GLUCONATE 0.12 % MT SOLN: 15.0000 mL | Freq: Once | OROMUCOSAL | Status: AC

## 2022-04-06 MED ORDER — PROPOFOL 10 MG/ML IV BOLUS
INTRAVENOUS | Status: DC | PRN
  Administered 2022-04-06: 90 mg via INTRAVENOUS

## 2022-04-06 MED ORDER — DEXAMETHASONE SODIUM PHOSPHATE 10 MG/ML IJ SOLN
INTRAMUSCULAR | Status: DC | PRN
  Administered 2022-04-06: 5 mg via INTRAVENOUS

## 2022-04-06 MED ORDER — SUGAMMADEX SODIUM 200 MG/2ML IV SOLN
INTRAVENOUS | Status: DC | PRN
  Administered 2022-04-06: 400 mg via INTRAVENOUS

## 2022-04-06 MED ORDER — PHENYLEPHRINE HCL-NACL 20-0.9 MG/250ML-% IV SOLN
INTRAVENOUS | Status: DC | PRN
  Administered 2022-04-06: 50 ug/min via INTRAVENOUS

## 2022-04-06 MED ORDER — PHENYLEPHRINE 80 MCG/ML (10ML) SYRINGE FOR IV PUSH (FOR BLOOD PRESSURE SUPPORT)
PREFILLED_SYRINGE | INTRAVENOUS | Status: AC
  Filled 2022-04-06: qty 10

## 2022-04-06 MED ORDER — AMISULPRIDE (ANTIEMETIC) 5 MG/2ML IV SOLN: 10.0000 mg | Freq: Once | INTRAVENOUS | Status: DC | PRN

## 2022-04-06 MED ORDER — DEXAMETHASONE SODIUM PHOSPHATE 10 MG/ML IJ SOLN
INTRAMUSCULAR | Status: AC
  Filled 2022-04-06: qty 1

## 2022-04-06 MED ORDER — FENTANYL CITRATE (PF) 250 MCG/5ML IJ SOLN
INTRAMUSCULAR | Status: AC
  Filled 2022-04-06: qty 5

## 2022-04-06 MED ORDER — EPHEDRINE 5 MG/ML INJ
INTRAVENOUS | Status: AC
  Filled 2022-04-06: qty 5

## 2022-04-06 MED ORDER — ROCURONIUM BROMIDE 10 MG/ML (PF) SYRINGE
PREFILLED_SYRINGE | INTRAVENOUS | Status: DC | PRN
  Administered 2022-04-06: 50 mg via INTRAVENOUS

## 2022-04-06 MED ORDER — 0.9 % SODIUM CHLORIDE (POUR BTL) OPTIME
TOPICAL | Status: DC | PRN
  Administered 2022-04-06: 1000 mL

## 2022-04-06 MED ORDER — ORAL CARE MOUTH RINSE: 15.0000 mL | Freq: Once | OROMUCOSAL | Status: AC

## 2022-04-06 MED ORDER — ACETAMINOPHEN 500 MG PO TABS
1000.0000 mg | ORAL_TABLET | Freq: Once | ORAL | Status: AC
  Administered 2022-04-06: 1000 mg via ORAL
  Filled 2022-04-06: qty 2

## 2022-04-06 MED ORDER — PROPOFOL 10 MG/ML IV BOLUS
INTRAVENOUS | Status: AC
  Filled 2022-04-06: qty 20

## 2022-04-06 SURGICAL SUPPLY — 31 items
BAG COUNTER SPONGE SURGICOUNT (BAG) ×1 IMPLANT
BENZOIN TINCTURE PRP APPL 2/3 (GAUZE/BANDAGES/DRESSINGS) ×1 IMPLANT
CANISTER SUCT 3000ML PPV (MISCELLANEOUS) IMPLANT
CHLORAPREP W/TINT 26 (MISCELLANEOUS) ×1 IMPLANT
COVER SURGICAL LIGHT HANDLE (MISCELLANEOUS) ×1 IMPLANT
DERMABOND ADVANCED .7 DNX12 (GAUZE/BANDAGES/DRESSINGS) IMPLANT
DRSG TEGADERM 2-3/8X2-3/4 SM (GAUZE/BANDAGES/DRESSINGS) ×2 IMPLANT
DRSG TEGADERM 4X4.75 (GAUZE/BANDAGES/DRESSINGS) IMPLANT
ELECT REM PT RETURN 9FT ADLT (ELECTROSURGICAL) ×1
ELECTRODE REM PT RTRN 9FT ADLT (ELECTROSURGICAL) ×1 IMPLANT
GLOVE BIO SURGEON STRL SZ7 (GLOVE) ×1 IMPLANT
GLOVE BIOGEL PI IND STRL 7.5 (GLOVE) ×1 IMPLANT
GOWN STRL REUS W/ TWL LRG LVL3 (GOWN DISPOSABLE) ×2 IMPLANT
GOWN STRL REUS W/TWL LRG LVL3 (GOWN DISPOSABLE) ×2
KIT BASIN OR (CUSTOM PROCEDURE TRAY) ×1 IMPLANT
KIT CLEAN ENDO COMPLIANCE (KITS) IMPLANT
KIT TURNOVER KIT B (KITS) ×1 IMPLANT
NS IRRIG 1000ML POUR BTL (IV SOLUTION) ×1 IMPLANT
PAD ARMBOARD 7.5X6 YLW CONV (MISCELLANEOUS) ×2 IMPLANT
PENCIL BUTTON HOLSTER BLD 10FT (ELECTRODE) IMPLANT
SET TUBE SMOKE EVAC HIGH FLOW (TUBING) ×1 IMPLANT
SLEEVE ENDOPATH XCEL 5M (ENDOMECHANICALS) ×1 IMPLANT
STRIP CLOSURE SKIN 1/2X4 (GAUZE/BANDAGES/DRESSINGS) ×1 IMPLANT
SUT ETHILON 2 0 FS 18 (SUTURE) IMPLANT
SUT MNCRL AB 4-0 PS2 18 (SUTURE) ×1 IMPLANT
TOWEL GREEN STERILE (TOWEL DISPOSABLE) ×1 IMPLANT
TOWEL GREEN STERILE FF (TOWEL DISPOSABLE) ×1 IMPLANT
TRAY LAPAROSCOPIC MC (CUSTOM PROCEDURE TRAY) ×1 IMPLANT
TROCAR ADV FIXATION 5X100MM (TROCAR) IMPLANT
TROCAR Z-THREAD OPTICAL 5X100M (TROCAR) ×1 IMPLANT
TUBE ENDOVIVE SAFETY PEG 24 (TUBING) IMPLANT

## 2022-04-06 NOTE — Plan of Care (Signed)
  Problem: Health Behavior/Discharge Planning: Goal: Ability to manage health-related needs will improve Outcome: Progressing   Problem: Nutrition: Goal: Adequate nutrition will be maintained Outcome: Progressing   Problem: Coping: Goal: Level of anxiety will decrease Outcome: Progressing   Problem: Pain Managment: Goal: General experience of comfort will improve Outcome: Progressing   Problem: Safety: Goal: Ability to remain free from injury will improve Outcome: Progressing   Problem: Respiratory: Goal: Ability to maintain adequate ventilation will improve Outcome: Progressing

## 2022-04-06 NOTE — Progress Notes (Signed)
PROGRESS NOTE        PATIENT DETAILS Name: [name] [name] Age: 88 y.o. Sex: male Date of Birth: 11-Dec-1933 Admit Date: 04/01/2022 Admitting Physician Briscoe Deutscher, MD TWK:[id number], Binnie Rail, MD  Brief Summary: Patient is a 88 y.o.  male with history of HTN, hypothyroidism who presented with dysphagia (several months), cough-he was found to have aspiration pneumonia.  See below for further details.  Significant events: 10/6>> admit to [facility]-aspiration pneumonia  Significant studies: 10/6>> CXR: Patchy infiltrates at bilateral lung bases. 10/6>> CT head: No acute intracranial abnormality 10/6>> CT soft tissue neck: Consistent with DISH 10/8>> MRI C-spine: Multilevel spondylosis of the cervical spine.  Significant microbiology data: 10/6>> COVID/influenza PCR: Negative 10/6>> blood culture: No growth 10/7>> sputum culture: Pseudomonas  Procedures: 10/8>> EGD: Normal esophagus/stomach  Consults: Neurosurgery GI IR  Subjective: No issues overnight.  Scheduled for PEG tube placement by general surgery today.  Objective: Vitals: Blood pressure (!) 140/72, pulse 72, temperature 97.9 F (36.6 C), temperature source Axillary, resp. rate 19, height 6' (1.829 m), weight 59.5 kg, SpO2 90 %.   Exam: Gen Exam:Alert awake-not in any distress HEENT:atraumatic, normocephalic Chest: B/L clear to auscultation anteriorly CVS:S1S2 regular Abdomen:soft non tender, non distended Extremities:no edema Neurology: Non focal Skin: no rash   Pertinent Labs/Radiology:    Latest Ref Rng & Units 04/05/2022   12:39 AM 04/04/2022    1:51 AM 04/03/2022    5:38 AM  CBC  WBC 4.0 - 10.5 K/uL 4.4  5.6  7.0   Hemoglobin 13.0 - 17.0 g/dL 8.5  9.1  8.6   Hematocrit 39.0 - 52.0 % 25.4  26.7  24.7   Platelets 150 - 400 K/uL 294  300  257     Lab Results  Component Value Date   NA 138 04/04/2022   K 3.8 04/04/2022   CL 107 04/04/2022   CO2 24 04/04/2022       Assessment/Plan: Aspiration PNA Afebrile-no leukocytosis. Overall clinically improved. Sputum cultures positive for Pseudomonas Switched to Zosyn on 10/9.  Dysphagia Etiology due to severe pharyngeal dysphagia due to DISH EGD without stricture/stenosis in esophagus Per son-worsening for the past 2-3 months-but ongoing for the past several years. After discussion with family/patient-since patient not able to meet nutrition needs via oral intake (minimal oral intake)-plans are for PEG tube placement today by general surgery.  Hyponatremia Resolved with IVF Etiology likely due to dehydration/poor oral intake  DISH Appreciate neurosurgical input-no benefit from surgery.  Hypothyroidism Continue Synthroid  HTN BP better after starting bisoprolol.   Cardura remains on hold.  Resume if BP persistently elevated  Normocytic anemia Iron deficiency Hemoglobin on the lower side-this is mostly from acute illness/IV fluid dilution..  No evidence of GI bleeding-EGD negative. Continue oral iron supplementation Follow CBC.  Severe protein calorie malnutrition Underweight: Estimated body mass index is 17.79 kg/m as calculated from the following:   Height as of this encounter: 6' (1.829 m).   Weight as of this encounter: 59.5 kg.   Code status:   Code Status: Full Code   DVT Prophylaxis: heparin injection 5,000 Units Start: 04/02/22 0600   Family Communication: Son-[name] [phone] updated on 10/9   Disposition Plan: Status is: Inpatient Remains inpatient appropriate because: Severe dysphagia-likely requires PEG tube insertion   Planned Discharge Destination:Home   Diet: Diet Order  Diet NPO time specified  Diet effective midnight                     Antimicrobial agents: Anti-infectives (From admission, onward)    Start     Dose/Rate Route Frequency Ordered Stop   04/04/22 1330  piperacillin-tazobactam (ZOSYN) IVPB 3.375 g        3.375  g 12.5 mL/hr over 240 Minutes Intravenous Every 8 hours 04/04/22 1240     04/02/22 0600  Ampicillin-Sulbactam (UNASYN) 3 g in sodium chloride 0.9 % 100 mL IVPB  Status:  Discontinued        3 g 200 mL/hr over 30 Minutes Intravenous Every 12 hours 04/02/22 0516 04/04/22 1148   04/01/22 2200  cefTRIAXone (ROCEPHIN) 1 g in sodium chloride 0.9 % 100 mL IVPB        1 g 200 mL/hr over 30 Minutes Intravenous  Once 04/01/22 2146 04/01/22 2308   04/01/22 2200  azithromycin (ZITHROMAX) 500 mg in sodium chloride 0.9 % 250 mL IVPB        500 mg 250 mL/hr over 60 Minutes Intravenous  Once 04/01/22 2146 04/01/22 2358        MEDICATIONS: Scheduled Meds:  bisoprolol  5 mg Oral Daily   feeding supplement  237 mL Oral TID BM   ferrous sulfate  325 mg Oral Daily   heparin  5,000 Units Subcutaneous Q8H   levothyroxine  50 mcg Oral Q0600   sodium chloride flush  3 mL Intravenous Q12H   thiamine  200 mg Oral Daily   Continuous Infusions:  lactated ringers 50 mL/hr at 04/04/22 0256   piperacillin-tazobactam (ZOSYN)  IV 3.375 g (04/06/22 0650)   PRN Meds:.acetaminophen **OR** acetaminophen, labetalol, ondansetron **OR** ondansetron (ZOFRAN) IV   I have personally reviewed following labs and imaging studies  LABORATORY DATA: CBC: Recent Labs  Lab 03/31/22 1636 04/01/22 2013 04/03/22 0538 04/04/22 0151 04/05/22 0039  WBC 13.9* 6.2 7.0 5.6 4.4  NEUTROABS 12.3* 5.8  --   --   --   HGB 10.2* 10.7* 8.6* 9.1* 8.5*  HCT 30.5* 31.4* 24.7* 26.7* 25.4*  MCV 86.4 83.5 83.4 84.2 84.9  PLT 288 316 257 300 294     Basic Metabolic Panel: Recent Labs  Lab 03/31/22 1636 04/01/22 2013 04/02/22 0538 04/03/22 0538 04/04/22 0559 04/05/22 0039 04/06/22 0029  NA 129* 129* 132* 136 138  --   --   K 4.2 3.9 3.8 3.5 3.8  --   --   CL 97* 93* 99 105 107  --   --   CO2 22 22 22 23 24   --   --   GLUCOSE 91 102* 83 93 106*  --   --   BUN 65* 70* 63* 52* 44*  --   --   CREATININE 2.49* 2.15* 1.96* 1.53*  1.23  --   --   CALCIUM 9.2 9.9 9.0 9.0 9.3  --   --   MG  --   --   --  2.0  --   --   --   PHOS  --   --   --  2.7 2.9 3.2 3.0     GFR: Estimated Creatinine Clearance: 36.3 mL/min (by C-G formula based on SCr of 1.23 mg/dL).  Liver Function Tests: Recent Labs  Lab 03/31/22 1636  AST 16  ALT 9  ALKPHOS 52  BILITOT 1.0  PROT 7.1  ALBUMIN 3.1*    No results for input(s): "  LIPASE", "AMYLASE" in the last 168 hours. No results for input(s): "AMMONIA" in the last 168 hours.  Coagulation Profile: No results for input(s): "INR", "PROTIME" in the last 168 hours.  Cardiac Enzymes: No results for input(s): "CKTOTAL", "CKMB", "CKMBINDEX", "TROPONINI" in the last 168 hours.  BNP (last 3 results) No results for input(s): "PROBNP" in the last 8760 hours.  Lipid Profile: No results for input(s): "CHOL", "HDL", "LDLCALC", "TRIG", "CHOLHDL", "LDLDIRECT" in the last 72 hours.  Thyroid Function Tests: No results for input(s): "TSH", "T4TOTAL", "FREET4", "T3FREE", "THYROIDAB" in the last 72 hours.   Anemia Panel: Recent Labs    04/04/22 0151  VITAMINB12 64*     Urine analysis:    Component Value Date/Time   COLORURINE YELLOW 04/02/2022 0600   APPEARANCEUR HAZY (A) 04/02/2022 0600   LABSPEC 1.011 04/02/2022 0600   PHURINE 5.0 04/02/2022 0600   GLUCOSEU NEGATIVE 04/02/2022 0600   HGBUR SMALL (A) 04/02/2022 0600   BILIRUBINUR NEGATIVE 04/02/2022 0600   KETONESUR NEGATIVE 04/02/2022 0600   PROTEINUR NEGATIVE 04/02/2022 0600   UROBILINOGEN 0.2 12/03/2021 1921   NITRITE NEGATIVE 04/02/2022 0600   LEUKOCYTESUR MODERATE (A) 04/02/2022 0600    Sepsis Labs: Lactic Acid, Venous    Component Value Date/Time   LATICACIDVEN 1.1 04/01/2022 2305    MICROBIOLOGY: Recent Results (from the past 240 hour(s))  Resp Panel by RT-PCR (Flu A&B, Covid) Anterior Nasal Swab     Status: None   Collection Time: 04/01/22  9:56 PM   Specimen: Anterior Nasal Swab  Result Value Ref Range  Status   SARS Coronavirus 2 by RT PCR NEGATIVE NEGATIVE Final    Comment: (NOTE) SARS-CoV-2 target nucleic acids are NOT DETECTED.  The SARS-CoV-2 RNA is generally detectable in upper respiratory specimens during the acute phase of infection. The lowest concentration of SARS-CoV-2 viral copies this assay can detect is 138 copies/mL. A negative result does not preclude SARS-Cov-2 infection and should not be used as the sole basis for treatment or other patient management decisions. A negative result may occur with  improper specimen collection/handling, submission of specimen other than nasopharyngeal swab, presence of viral mutation(s) within the areas targeted by this assay, and inadequate number of viral copies(<138 copies/mL). A negative result must be combined with clinical observations, patient history, and epidemiological information. The expected result is Negative.  Fact Sheet for Patients:  BloggerCourse.com  Fact Sheet for Healthcare Providers:  SeriousBroker.it  This test is no t yet approved or cleared by the [place] FDA and  has been authorized for detection and/or diagnosis of SARS-CoV-2 by FDA under an Emergency Use Authorization (EUA). This EUA will remain  in effect (meaning this test can be used) for the duration of the COVID-19 declaration under Section 564(b)(1) of the Act, 21 U.S.C.section 360bbb-3(b)(1), unless the authorization is terminated  or revoked sooner.       Influenza A by PCR NEGATIVE NEGATIVE Final   Influenza B by PCR NEGATIVE NEGATIVE Final    Comment: (NOTE) The Xpert Xpress SARS-CoV-2/FLU/RSV plus assay is intended as an aid in the diagnosis of influenza from Nasopharyngeal swab specimens and should not be used as a sole basis for treatment. Nasal washings and aspirates are unacceptable for Xpert Xpress SARS-CoV-2/FLU/RSV testing.  Fact Sheet for  Patients: BloggerCourse.com  Fact Sheet for Healthcare Providers: SeriousBroker.it  This test is not yet approved or cleared by the [place] FDA and has been authorized for detection and/or diagnosis of SARS-CoV-2 by FDA under an Emergency Use  Authorization (EUA). This EUA will remain in effect (meaning this test can be used) for the duration of the COVID-19 declaration under Section 564(b)(1) of the Act, 21 U.S.C. section 360bbb-3(b)(1), unless the authorization is terminated or revoked.  Performed at [facility], [address] [place], [place] [address]   Blood culture (routine x 2)     Status: None (Preliminary result)   Collection Time: 04/01/22 10:05 PM   Specimen: BLOOD  Result Value Ref Range Status   Specimen Description   Final    BLOOD Performed at [facility], [address] [place], [place] [address]    Special Requests   Final    NONE Performed at [facility], [address], [place], [place] [address]    Culture   Final    NO GROWTH 3 DAYS Performed at [facility], [address], [place], [place] [address]    Report Status PENDING  Incomplete  Blood culture (routine x 2)     Status: None (Preliminary result)   Collection Time: 04/01/22 10:10 PM   Specimen: BLOOD  Result Value Ref Range Status   Specimen Description   Final    BLOOD Performed at [facility], [address] [place], [place] [address]    Special Requests   Final    NONE Performed at [facility], [address], [place], [place] [address]    Culture   Final    NO GROWTH 3 DAYS Performed at [name], [address] [place], [place] [address]    Report Status PENDING  Incomplete  Expectorated Sputum Assessment w Gram Stain, Rflx to Resp Cult     Status: None   Collection Time: 04/02/22 10:00 AM   Specimen:  Sputum  Result Value Ref Range Status   Specimen Description SPU  Final   Special Requests EXPECTORATED  Final   Sputum evaluation   Final    THIS SPECIMEN IS ACCEPTABLE FOR SPUTUM CULTURE Performed at [facility], [address] [place], [place] [address]    Report Status 04/02/2022 FINAL  Final  Culture, Respiratory w Gram Stain     Status: None   Collection Time: 04/02/22 10:00 AM   Specimen: Sputum  Result Value Ref Range Status   Specimen Description SPU  Final   Special Requests EXPECTORATED Reflexed from [id number]  Final   Gram Stain   Final    RARE WBC PRESENT, PREDOMINANTLY PMN FEW GRAM NEGATIVE RODS RARE GRAM POSITIVE COCCI    Culture   Final    FEW PSEUDOMONAS AERUGINOSA WITHIN MIXED ORGANISMS Performed at [facility], [address], [place], [place] [address]    Report Status 04/06/2022 FINAL  Final   Organism ID, Bacteria PSEUDOMONAS AERUGINOSA  Final      Susceptibility   Pseudomonas aeruginosa - MIC*    CEFTAZIDIME 4 SENSITIVE Sensitive     CIPROFLOXACIN <=0.25 SENSITIVE Sensitive     GENTAMICIN 2 SENSITIVE Sensitive     IMIPENEM 2 SENSITIVE Sensitive     PIP/TAZO 8 SENSITIVE Sensitive     CEFEPIME 2 SENSITIVE Sensitive     * FEW PSEUDOMONAS AERUGINOSA    RADIOLOGY STUDIES/RESULTS: CT ABDOMEN WO CONTRAST  Result Date: 04/05/2022 CLINICAL DATA:  Evaluate gastric anatomy prior to potential percutaneous gastrostomy tube placement. EXAM: CT ABDOMEN WITHOUT CONTRAST TECHNIQUE: Multidetector CT imaging of the abdomen was performed following the standard protocol without IV contrast. RADIATION DOSE REDUCTION: This  exam was performed according to the departmental dose-optimization program which includes automated exposure control, adjustment of the mA and/or kV according to patient size and/or use of iterative reconstruction technique. COMPARISON:  Chest CT-12/10/2021 FINDINGS: Lower chest: Limited visualization of the lower thorax demonstrates trace  bilateral effusions with associated bibasilar slightly nodular consolidative airspace opacities with associated air bronchograms. Ill-defined areas of ground-glass are seen within the imaged portions of the right middle lobe and lingula. Normal heart size. Coronary artery calcifications. Trace amount of pericardial fluid, presumably physiologic. Hepatobiliary: Normal hepatic contour. Normal noncontrast appearance of the gallbladder given degree of distention. No radiopaque gallstones or biliary sludge. No ascites. Pancreas: Normal noncontrast appearance of the pancreas. Spleen: Normal noncontrast appearance of the spleen. Adrenals/Urinary Tract: Normal noncontrast appearance of the bilateral kidneys. Linear calcifications about the right renal hila are favored to be vascular in etiology. No definite evidence of nephrolithiasis. No urinary obstruction. Normal noncontrast appearance the bilateral adrenal glands. The urinary bladder was not imaged. Stomach/Bowel: The transverse colon is interposed between the anterior aspect of the stomach and ventral abdominal wall. Ingested enteric contrast extends to the imaged distal descending colon. The cecum is noted to be located within the midline of the abdomen. Normal noncontrast appearance of the terminal ileum and appendix. No significant hiatal hernia. No pneumoperitoneum, pneumatosis or portal venous gas. Vascular/Lymphatic: Atherosclerotic plaque within a normal caliber abdominal aorta. Other: Mild diffuse body wall anasarca. Musculoskeletal: No acute or aggressive osseous abnormalities. Post L4-L5 paraspinal fusion and intervertebral disc space replacement without evidence of hardware failure or loosening. Moderate DDD is seen within the remainder of the lumbar spine, worse at T12-L1 and L2-L3 with disc space height loss, endplate irregularity and sclerosis. Bulky bridging syndesmophytes are noted about the anterior aspects of nearly all imaged thoracic and lumbar  intervertebral disc spaces. IMPRESSION: 1. Suboptimal percutaneous gastrostomy window with interposition of the transverse colon between the anterior wall of the stomach and ventral wall of the abdomen. If gastrostomy tube placement is still desired, surgical consultation is advised. If the patient is deemed a poor surgical candidate, percutaneous gastrostomy tube placement could be attempted with gastric insufflation, however would recommend either the patient receiving barium the night before the procedure (if no obstructive or ileus symptoms) or an enema on the fluoroscopy table to adequately demarcate the transverse colon. 2. Trace bilateral effusions with associated bibasilar slightly nodular airspace opacities with associated air bronchograms and scattered ill-defined areas of ground-glass, constellation of findings worrisome for multifocal infection and/or aspiration. 3. Coronary artery calcifications. Aortic Atherosclerosis (ICD10-I70.0). Electronically Signed   By: Sandi Mariscal M.D.   On: 04/05/2022 08:06     LOS: 4 days   Oren Binet, MD  [facility]    To contact the attending provider between 7A-7P or the covering provider during after hours 7P-7A, please log into the web site [email or web address] and access using universal [place] password for that web site. If you do not have the password, please call the hospital operator.  04/06/2022, 10:53 AM

## 2022-04-06 NOTE — Progress Notes (Signed)
[facility] night cross cover note:  In accordance with the patient's verbalized wishes, will discontinue telemetry at this time, noting that the order for tele had previously expired.     Babs Bertin, DO Hospitalist

## 2022-04-06 NOTE — Progress Notes (Signed)
Patient ID: [name] [name], male   DOB: [birth year], 88 y.o.   MRN: [id number]  Patient comfortable, has been NPO Will plan to proceed with PEG placement today.  We will insert the laparoscope to make sure that the colon is not interposed anterior to the stomach, then proceed with the usual PEG placement.   [name]. Georgette Dover, MD, [facility]  General Surgery   04/06/2022 8:24 AM

## 2022-04-06 NOTE — Anesthesia Procedure Notes (Signed)
Procedure Name: Intubation Date/Time: 04/06/2022 12:40 PM  Performed by: Erick Colace, CRNAPre-anesthesia Checklist: Patient identified, Emergency Drugs available, Suction available and Patient being monitored Patient Re-evaluated:Patient Re-evaluated prior to induction Oxygen Delivery Method: Circle system utilized Preoxygenation: Pre-oxygenation with 100% oxygen Induction Type: IV induction and Rapid sequence Ventilation: Mask ventilation without difficulty Laryngoscope Size: Mac and 4 Grade View: Grade I Tube type: Oral Tube size: 7.5 mm Number of attempts: 1 Airway Equipment and Method: Stylet and Oral airway Placement Confirmation: ETT inserted through vocal cords under direct vision, positive ETCO2 and breath sounds checked- equal and bilateral Secured at: 22 cm Tube secured with: Tape Dental Injury: Teeth and Oropharynx as per pre-operative assessment

## 2022-04-06 NOTE — Op Note (Signed)
Preop diagnosis: Failure to thrive due to dysphagia secondary to DISH  Postop diagnosis: Same Procedure performed: Upper endoscopy, placement of percutaneous endoscopic gastrostomy tube  Surgeon:Rashawn Rolon K Destanie Tibbetts Resident:Dr. Lossie Faes Anesthesia:  GEN Indications: This is an 88 year old male who has developed severe dysphagia secondary to spinal issues.  He has not been able to take adequate oral intake and has been losing weight.  We are asked to place a gastrostomy tube to aid in nutritional support.  Interventional radiology obtained a CT scan that showed that the transverse colon might be located anterior to the stomach so they deferred on percutaneous placement.  We were asked to see the patient to place feeding tube.  Description of procedure: The patient is brought to the operating room and placed in the supine position on the operating table.  After adequate level of general anesthesia was obtained, the abdomen was prepped with ChloraPrep and draped in sterile fashion.  A timeout was taken to ensure the proper patient and proper procedure.  The endoscope was passed through the mouth down into the stomach.  Initially, we were not able to transilluminate very well.  I made a small midline incision just above the umbilicus and prepared to insert a laparoscopic port to examine the location of the colon in relation to the anterior wall of the stomach.  However, while was preparing to insert the laparoscope, we continued insufflating of the stomach.  The stomach now appears to be firmly opposed to the anterior abdominal wall with easy transillumination and direct one-to-one palpation visualized internally.  The Angiocath was inserted through the anterior abdominal wall in the left upper quadrant 3 fingerbreadths below the costal margin.  This passed directly into the stomach.  The wire was passed and grasped with the endoscopic snare.  The scope and snare were removed from the mouth and attached to the  feeding tube.  We then pulled the guidewire back down the esophagus into the stomach.  The endoscope was reinserted and watch the feeding tube come down.  We made a small cruciate incision in the cyst abdominal wall and pulled the tube exteriorly.  The patient has very little adipose tissue and we were able to pull the tube to about 1.5 cm.  No bleeding was noted internally.  The endoscope was removed after desufflated the stomach.  The flange and clamp were attached to the feeding tube.  The adapter was inserted into the end of the feeding tube.  We secured the tube to the skin with 3 interrupted 2-0 Ethilon sutures.  4-0 Monocryl was used to close the small midline incision.  Dermabond was applied.  Patient was extubated and brought to recovery room in stable condition.  All sponge, instrument, and needle counts are correct.  [name]. Georgette Dover, MD, [facility]  General Surgery   04/06/2022 1:23 PM

## 2022-04-06 NOTE — Progress Notes (Signed)
Patient off the unit for OR procedure at this time. Report Called to short stay Alvis Lemmings, RN.

## 2022-04-06 NOTE — Transfer of Care (Signed)
Immediate Anesthesia Transfer of Care Note  Patient: Glori Luis  Procedure(s) Performed: LAPAROSCOPIC ASSISTED PERCUTANEOUS ENDOSCOPIC GASTROSTOMY PLACEMENT (Abdomen)  Patient Location: PACU  Anesthesia Type:General  Level of Consciousness: awake  Airway & Oxygen Therapy: Patient Spontanous Breathing and Patient connected to face mask oxygen  Post-op Assessment: Report given to RN and Post -op Vital signs reviewed and stable  Post vital signs: Reviewed and stable  Last Vitals:  Vitals Value Taken Time  BP 143/75 04/06/22 1325  Temp    Pulse 65 04/06/22 1330  Resp 15 04/06/22 1330  SpO2 100 % 04/06/22 1330  Vitals shown include unvalidated device data.  Last Pain:  Vitals:   04/06/22 1158  TempSrc:   PainSc: 0-No pain      Patients Stated Pain Goal: 0 (27/06/23 7628)  Complications: No notable events documented.

## 2022-04-06 NOTE — Anesthesia Postprocedure Evaluation (Signed)
Anesthesia Post Note  Patient: [name] [name]  Procedure(s) Performed: LAPAROSCOPIC ASSISTED PERCUTANEOUS ENDOSCOPIC GASTROSTOMY PLACEMENT (Abdomen)     Patient location during evaluation: PACU Anesthesia Type: General Level of consciousness: sedated Pain management: pain level controlled Vital Signs Assessment: post-procedure vital signs reviewed and stable Respiratory status: spontaneous breathing and respiratory function stable Cardiovascular status: stable Postop Assessment: no apparent nausea or vomiting Anesthetic complications: no   No notable events documented.  Last Vitals:  Vitals:   04/06/22 1400 04/06/22 1421  BP: (!) 150/70 (!) 140/71  Pulse: 61   Resp: 14   Temp: 36.5 C 36.6 C  SpO2: 100%     Last Pain:  Vitals:   04/06/22 1421  TempSrc: Axillary  PainSc:                  Kloie Whiting DANIEL

## 2022-04-06 NOTE — Anesthesia Preprocedure Evaluation (Addendum)
Anesthesia Evaluation  Patient identified by MRN, date of birth, ID band Patient awake    Reviewed: Allergy & Precautions, NPO status , Patient's Chart, lab work & pertinent test results  History of Anesthesia Complications Negative for: history of anesthetic complications  Airway Mallampati: II  TM Distance: >3 FB Neck ROM: Full    Dental no notable dental hx. (+) Dental Advisory Given   Pulmonary pneumonia,    Pulmonary exam normal        Cardiovascular hypertension, Pt. on medications Normal cardiovascular exam     Neuro/Psych negative neurological ROS     GI/Hepatic negative GI ROS, Neg liver ROS, Dysphagia    Endo/Other  Hypothyroidism   Renal/GU negative Renal ROS     Musculoskeletal negative musculoskeletal ROS (+)   Abdominal   Peds  Hematology  (+) Blood dyscrasia, anemia ,   Anesthesia Other Findings   Reproductive/Obstetrics                            Anesthesia Physical Anesthesia Plan  ASA: 3  Anesthesia Plan: General   Post-op Pain Management: Minimal or no pain anticipated and Tylenol PO (pre-op)*   Induction: Intravenous  PONV Risk Score and Plan: 2 and Ondansetron and Dexamethasone  Airway Management Planned: Oral ETT  Additional Equipment:   Intra-op Plan:   Post-operative Plan: Extubation in OR  Informed Consent: I have reviewed the patients History and Physical, chart, labs and discussed the procedure including the risks, benefits and alternatives for the proposed anesthesia with the patient or authorized representative who has indicated his/her understanding and acceptance.     Dental advisory given  Plan Discussed with: Anesthesiologist, CRNA and Surgeon  Anesthesia Plan Comments:        Anesthesia Quick Evaluation

## 2022-04-06 NOTE — Progress Notes (Signed)
PT Cancellation Note  Patient Details Name: Callie Bunyard MRN: [id number] DOB: 08-17-1933   Cancelled Treatment:    Reason Eval/Treat Not Completed: (P) Patient at procedure or test/unavailable (off floor for PEG tube insertion -AM. Sleeping when reattempted in PM.) Will continue efforts per PT plan of care as schedule permits.   Kara Pacer Junette Bernat 04/06/2022, 4:25 PM

## 2022-04-07 ENCOUNTER — Ambulatory Visit: Payer: Self-pay | Admitting: Family Medicine

## 2022-04-07 ENCOUNTER — Encounter (HOSPITAL_COMMUNITY): Payer: Self-pay | Admitting: Surgery

## 2022-04-07 LAB — CULTURE, BLOOD (ROUTINE X 2)
Culture: NO GROWTH
Culture: NO GROWTH

## 2022-04-07 LAB — GLUCOSE, CAPILLARY: Glucose-Capillary: 105 mg/dL — ABNORMAL HIGH (ref 70–99)

## 2022-04-07 LAB — PHOSPHORUS
Phosphorus: 3.3 mg/dL (ref 2.5–4.6)
Phosphorus: 3.1 mg/dL (ref 2.5–4.6)

## 2022-04-07 LAB — MAGNESIUM: Magnesium: 2.1 mg/dL (ref 1.7–2.4)

## 2022-04-07 MED ORDER — PROSOURCE TF20 ENFIT COMPATIBL EN LIQD
60.0000 mL | Freq: Every day | ENTERAL | Status: DC
  Administered 2022-04-07 – 2022-04-08 (×2): 60 mL
  Filled 2022-04-07 (×2): qty 60

## 2022-04-07 MED ORDER — ENSURE ENLIVE PO LIQD
237.0000 mL | Freq: Two times a day (BID) | ORAL | Status: DC
  Administered 2022-04-08: 237 mL via ORAL

## 2022-04-07 MED ORDER — DOXAZOSIN MESYLATE 4 MG PO TABS
4.0000 mg | ORAL_TABLET | Freq: Every day | ORAL | Status: DC
  Administered 2022-04-07: 4 mg via ORAL
  Filled 2022-04-07: qty 1

## 2022-04-07 MED ORDER — FREE WATER
135.0000 mL | Freq: Four times a day (QID) | Status: DC
  Administered 2022-04-07 – 2022-04-09 (×7): 135 mL

## 2022-04-07 MED ORDER — JEVITY 1.2 CAL PO LIQD
1000.0000 mL | ORAL | Status: DC
  Administered 2022-04-07 – 2022-04-08 (×2): 1000 mL
  Filled 2022-04-07 (×6): qty 1000

## 2022-04-07 MED ORDER — ACETAMINOPHEN 10 MG/ML IV SOLN
1000.0000 mg | Freq: Once | INTRAVENOUS | Status: AC
  Administered 2022-04-07: 1000 mg via INTRAVENOUS
  Filled 2022-04-07: qty 100

## 2022-04-07 NOTE — Plan of Care (Signed)
  Problem: Education: Goal: Knowledge of General Education information will improve Description Including pain rating scale, medication(s)/side effects and non-pharmacologic comfort measures Outcome: Progressing   Problem: Health Behavior/Discharge Planning: Goal: Ability to manage health-related needs will improve Outcome: Progressing   Problem: Activity: Goal: Risk for activity intolerance will decrease Outcome: Progressing   Problem: Nutrition: Goal: Adequate nutrition will be maintained Outcome: Progressing   Problem: Coping: Goal: Level of anxiety will decrease Outcome: Progressing   Problem: Elimination: Goal: Will not experience complications related to bowel motility Outcome: Progressing Goal: Will not experience complications related to urinary retention Outcome: Progressing   Problem: Pain Managment: Goal: General experience of comfort will improve Outcome: Progressing   Problem: Safety: Goal: Ability to remain free from injury will improve Outcome: Progressing   Problem: Skin Integrity: Goal: Risk for impaired skin integrity will decrease Outcome: Progressing   Problem: Clinical Measurements: Goal: Ability to maintain a body temperature in the normal range will improve Outcome: Progressing   Problem: Respiratory: Goal: Ability to maintain adequate ventilation will improve Outcome: Progressing Goal: Ability to maintain a clear airway will improve Outcome: Progressing   

## 2022-04-07 NOTE — Progress Notes (Addendum)
Physical Therapy Treatment Patient Details Name: [name] [name] MRN: [id number] DOB: 1933-10-08 Today's Date: 04/07/2022   History of Present Illness [name] [name] is a pleasant 88 y.o. male who presents to the emergency department with dysphagia, shortness of breath, and cough.  His work-up was significant for aspiration of pneumonia, due to worsening dysphagia over the last month, he has CT significant for DISH. PEG tube placed 04/06/22. Orthostatic hypotension in PT session on 10/12. PMH: hypertension, hypothyroidism, and BPH    PT Comments    Pt received in supine, sleeping but agreeable to therapy session after being awoken and with good participation and fair tolerance for transfer, exercise and pre-gait training at bedside with RW. Pt limited due to symptomatic orthostatic hypotension, RN/MD notified. RN entering room to start tube feeds at end of session, hopefully with improved hydration/intake BP will be more stable next session so pt can progress gait/stair instruction. Pt needing up to heavy minA to perform functional mobility tasks this date with RW support to stand. Pt continues to benefit from PT services to progress toward functional mobility goals. Plan to bring HEP handout next session to reinforce.  Recommendations for follow up therapy are one component of a multi-disciplinary discharge planning process, led by the attending physician.  Recommendations may be updated based on patient status, additional functional criteria and insurance authorization.  Follow Up Recommendations  Home health PT (consider OPPT if HHPT not available given uninsured status)     Assistance Recommended at Discharge Frequent or constant Supervision/Assistance  Patient can return home with the following A little help with walking and/or transfers;A lot of help with bathing/dressing/bathroom;Assistance with cooking/housework;Direct supervision/assist for medications management;Direct supervision/assist for  financial management;Assist for transportation;Help with stairs or ramp for entrance   Equipment Recommendations  Rolling walker (2 wheels);BSC/3in1 (pt not currently ambulating household distances therefore will need 3in1)    Recommendations for Other Services       Precautions / Restrictions Precautions Precautions: None Restrictions Weight Bearing Restrictions: No     Mobility  Bed Mobility Overal bed mobility: Needs Assistance Bed Mobility: Rolling, Sidelying to Sit Rolling: Min guard Sidelying to sit: Min assist       General bed mobility comments: cues for log rolling, assist with BLE and trunk raising.    Transfers Overall transfer level: Needs assistance Equipment used: Rolling walker (2 wheels) Transfers: Sit to/from Stand Sit to Stand: Min assist           General transfer comment: min A for power up and steadying with RW; from EOB x3 reps, to chair, cues for UE placement each time    Ambulation/Gait Ambulation/Gait assistance: Min assist Gait Distance (Feet): 4 Feet Assistive device: Rolling walker (2 wheels) Gait Pattern/deviations: Step-through pattern, Trunk flexed, Narrow base of support, Shuffle     Pre-gait activities: standing hip flexion x10 reps at bedside General Gait Details: pivotal steps bed>chair, pt with evolving dizziness and when orthostatics checked, pt found to be orthostatic so defer longer distance gait trial; RN entering room to start tube feeds.   Stairs             Wheelchair Mobility    Modified Rankin (Stroke Patients Only)       Balance Overall balance assessment: Needs assistance Sitting-balance support: Feet supported Sitting balance-Leahy Scale: Fair     Standing balance support: Bilateral upper extremity supported, Reliant on assistive device for balance Standing balance-Leahy Scale: Poor Standing balance comment: posterior lean with dynamic standing tasks/weight shifting in RW  Cognition Arousal/Alertness: Awake/alert Behavior During Therapy: WFL for tasks assessed/performed Overall Cognitive Status: Within Functional Limits for tasks assessed                                 General Comments: Pleasantly cooperative, occasionally responding to arabic translator on Ipad in [place].        Exercises Other Exercises Other Exercises: seated BLE AROM: hip flexion, LAQ x10 reps ea Other Exercises: STS x 3 reps for strengthening Other Exercises: standing hip flexion x10 reps ea at RW    General Comments General comments (skin integrity, edema, etc.): BP 127/54 seated and BP 102/54 standing; BP 127/45 (74) sitting after standing reading taken; SpO2 WFL on RA and HR 70's bpm sitting. RN/MD notified Translation via [employer] interpreter on iPad [phone] [name] [name] and pt Daughter in [place] to interpret after she arrived to room.      Pertinent Vitals/Pain Pain Assessment Pain Assessment: 0-10 Pain Score: 8  Pain Location: low back pain (chronic issue per pt) Pain Descriptors / Indicators: Grimacing, Discomfort, Sore Pain Intervention(s): Monitored during session, Repositioned, Ice applied     PT Goals (current goals can now be found in the care plan section) Acute Rehab PT Goals Patient Stated Goal: to feel better and go home PT Goal Formulation: With patient/family Time For Goal Achievement: 04/18/22 Progress towards PT goals: Progressing toward goals    Frequency    Min 3X/week      PT Plan Current plan remains appropriate       AM-PAC PT "6 Clicks" Mobility   Outcome Measure  Help needed turning from your back to your side while in a flat bed without using bedrails?: A Little Help needed moving from lying on your back to sitting on the side of a flat bed without using bedrails?: A Lot (mod cues) Help needed moving to and from a bed to a chair (including a wheelchair)?: A Little Help needed standing up from a chair  using your arms (e.g., wheelchair or bedside chair)?: A Little Help needed to walk in hospital room?: Total Help needed climbing 3-5 steps with a railing? : Total 6 Click Score: 13    End of Session Equipment Utilized During Treatment: Gait belt Activity Tolerance: Patient tolerated treatment well Patient left: in chair;with call bell/phone within reach;with chair alarm set;with family/visitor present Nurse Communication: Mobility status PT Visit Diagnosis: Other abnormalities of gait and mobility (R26.89);Muscle weakness (generalized) (M62.81);Difficulty in walking, not elsewhere classified (R26.2)     Time: [id number] PT Time Calculation (min) (ACUTE ONLY): 37 min  Charges:  $Therapeutic Exercise: 8-22 mins $Therapeutic Activity: 8-22 mins                     Scout Guyett P., PTA Acute Rehabilitation Services Secure Chat Preferred 9a-5:30pm Office: [place] 04/07/2022, 5:36 PM

## 2022-04-07 NOTE — TOC Progression Note (Addendum)
Transition of Care [name]) - Progression Note    Patient Details  Name: [name] [name] MRN: [id number] Date of Birth: 1933-10-17  [facility]) CM/SW Contact  Cyndi Bender, RN Phone Number: 04/07/2022, 9:07 AM  Clinical Narrative:     Patient had PEG tube placed yesterday and will require tube feedings at home. Pam with [facility] is aware and is following this patient.   Spoke to son who is aware the tube feed will be private pay.  Expected Discharge Plan: OP Rehab Barriers to Discharge: Continued Medical Work up  Expected Discharge Plan and Services Expected Discharge Plan: OP Rehab   Discharge Planning Services: CM Consult, [place] [facility]   Living arrangements for the past 2 months: Single Family Home                 DME Arranged: Walker rolling, Bedside commode DME Agency: [employer] Date DME Agency Contacted: 04/04/22 Time DME Agency Contacted: [phone] Representative spoke with at DME Agency: [place]             Social Determinants of Health ([place]) Interventions    Readmission Risk Interventions     No data to display

## 2022-04-07 NOTE — Progress Notes (Addendum)
[facility] Progress Note  1 Day Post-Op  Subjective: CC-  Son at bedside. Patient is comfortable this morning. No abdominal pain, bloating, nausea, or vomiting. No flatus or BM.  Objective: Vital signs in last 24 hours: Temp:  [97.7 F (36.5 C)-98.9 F (37.2 C)] 97.9 F (36.6 C) (10/12 0808) Pulse Rate:  [58-75] 72 (10/12 0400) Resp:  [14-22] 17 (10/12 0808) BP: (133-165)/(61-75) 165/71 (10/12 0808) SpO2:  [97 %-100 %] 100 % (10/12 0400) Weight:  [59.5 kg] 59.5 kg (10/11 1153) Last BM Date : 04/05/22  Intake/Output from previous day: 10/11 0701 - 10/12 0700 In: 900 [I.V.:650; IV Piggyback:250] Out: 329 [Urine:750; Blood:5] Intake/Output this shift: No intake/output data recorded.  PE: Gen:  Alert, NAD, pleasant Abd: soft, ND, nontender, PEG site cdi  Lab Results:  Recent Labs    04/05/22 0039  WBC 4.4  HGB 8.5*  HCT 25.4*  PLT 294   BMET No results for input(s): "NA", "K", "CL", "CO2", "GLUCOSE", "BUN", "CREATININE", "CALCIUM" in the last 72 hours. PT/INR No results for input(s): "LABPROT", "INR" in the last 72 hours. CMP     Component Value Date/Time   NA 138 04/04/2022 0559   NA 133 (L) 03/18/2022 1334   K 3.8 04/04/2022 0559   CL 107 04/04/2022 0559   CO2 24 04/04/2022 0559   GLUCOSE 106 (H) 04/04/2022 0559   BUN 44 (H) 04/04/2022 0559   BUN 40 (H) 03/18/2022 1334   CREATININE 1.23 04/04/2022 0559   CALCIUM 9.3 04/04/2022 0559   PROT 7.1 03/31/2022 1636   PROT 7.7 03/10/2022 1143   ALBUMIN 3.1 (L) 03/31/2022 1636   ALBUMIN 4.0 03/10/2022 1143   AST 16 03/31/2022 1636   ALT 9 03/31/2022 1636   ALKPHOS 52 03/31/2022 1636   BILITOT 1.0 03/31/2022 1636   BILITOT 0.4 03/10/2022 1143   GFRNONAA 57 (L) 04/04/2022 0559   GFRAA 63 01/16/2017 1225   Lipase     Component Value Date/Time   LIPASE 35 10/18/2008 1700       Studies/Results: No results found.  Anti-infectives: Anti-infectives (From admission, onward)    Start      Dose/Rate Route Frequency Ordered Stop   04/04/22 1330  piperacillin-tazobactam (ZOSYN) IVPB 3.375 g        3.375 g 12.5 mL/hr over 240 Minutes Intravenous Every 8 hours 04/04/22 1240     04/02/22 0600  Ampicillin-Sulbactam (UNASYN) 3 g in sodium chloride 0.9 % 100 mL IVPB  Status:  Discontinued        3 g 200 mL/hr over 30 Minutes Intravenous Every 12 hours 04/02/22 0516 04/04/22 1148   04/01/22 2200  cefTRIAXone (ROCEPHIN) 1 g in sodium chloride 0.9 % 100 mL IVPB        1 g 200 mL/hr over 30 Minutes Intravenous  Once 04/01/22 2146 04/01/22 2308   04/01/22 2200  azithromycin (ZITHROMAX) 500 mg in sodium chloride 0.9 % 250 mL IVPB        500 mg 250 mL/hr over 60 Minutes Intravenous  Once 04/01/22 2146 04/01/22 2358        Assessment/Plan POD#1 s/p Upper endoscopy, placement of percutaneous endoscopic gastrostomy tube 10/11 Dr. Georgette Dover - PEG site cdi. Ok to start trickle tube feedings and gradually increase as tolerated. Continue abdominal binder at all times to protect PEG tube. The adapter at the end of the feeding tube is different than our normal one. Discussed with OR and endo and found one of our normal adapters  and switched it out. Tube is now ready to use for medications and tube feedings. We will sign off, please call with questions or concerns.    LOS: 5 days    Franne Forts, [name] Surgery 04/07/2022, 10:58 AM Please see Amion for pager number during day hours 7:00am-4:30pm

## 2022-04-07 NOTE — Progress Notes (Signed)
PROGRESS NOTE        PATIENT DETAILS Name: [name] [name] Age: 88 y.o. Sex: male Date of Birth: [birth year] Admit Date: 04/01/2022 Admitting Physician Briscoe Deutscher, MD TRZ:NBVAPOL, Binnie Rail, MD  Brief Summary: Patient is a 88 y.o.  male with history of HTN, hypothyroidism who presented with dysphagia (several months), cough-he was found to have aspiration pneumonia.  See below for further details.  Significant events: 10/6>> admit to [facility]-aspiration pneumonia  Significant studies: 10/6>> CXR: Patchy infiltrates at bilateral lung bases. 10/6>> CT head: No acute intracranial abnormality 10/6>> CT soft tissue neck: Consistent with DISH 10/8>> MRI C-spine: Multilevel spondylosis of the cervical spine.  Significant microbiology data: 10/6>> COVID/influenza PCR: Negative 10/6>> blood culture: No growth 10/7>> sputum culture: Pseudomonas  Procedures: 10/8>> EGD: Normal esophagus/stomach 10/11>> laparoscopic PEG tube placement  Consults: Neurosurgery GI IR General surgery  Subjective: No major issues overnight.  Lying comfortably in bed.  Hardly any pain at the operative site.  Objective: Vitals: Blood pressure (!) 165/71, pulse 72, temperature 97.9 F (36.6 C), temperature source Axillary, resp. rate 17, height 6' (1.829 m), weight 59.5 kg, SpO2 100 %.   Exam: Gen Exam:Alert awake-not in any distress HEENT:atraumatic, normocephalic Chest: B/L clear to auscultation anteriorly CVS:S1S2 regular Abdomen:soft non tender, non distended Extremities:no edema Neurology: Non focal Skin: no rash    Pertinent Labs/Radiology:    Latest Ref Rng & Units 04/05/2022   12:39 AM 04/04/2022    1:51 AM 04/03/2022    5:38 AM  CBC  WBC 4.0 - 10.5 K/uL 4.4  5.6  7.0   Hemoglobin 13.0 - 17.0 g/dL 8.5  9.1  8.6   Hematocrit 39.0 - 52.0 % 25.4  26.7  24.7   Platelets 150 - 400 K/uL 294  300  257     Lab Results  Component Value Date   NA 138 04/04/2022   K 3.8  04/04/2022   CL 107 04/04/2022   CO2 24 04/04/2022      Assessment/Plan: Aspiration PNA Afebrile-no leukocytosis. Overall clinically improved. Sputum cultures positive for Pseudomonas Switched to Zosyn on 10/9 with plans to switch to a oral agent when PEG tube feeds initiated.  Dysphagia Etiology due to severe pharyngeal dysphagia due to DISH EGD without stricture/stenosis in esophagus Per son-worsening for the past 2-3 months-but ongoing for the past several years. After discussion with family/patient-since patient not able to meet nutrition needs via oral intake (minimal oral intake)-PEG tube placed by general surgery on 10/11 General surgery planning on initiating trickle feeds today-nutrition consulted.  Hyponatremia Resolved with IVF Etiology likely due to dehydration/poor oral intake  DISH Appreciate neurosurgical input-no benefit from surgery.  Hypothyroidism Continue Synthroid  HTN BP elevated-continue bisoprolol-resume Cardura.    Normocytic anemia Iron deficiency Hemoglobin on the lower side-this is mostly from acute illness/IV fluid dilution..  No evidence of GI bleeding-EGD negative. Continue oral iron supplementation Follow CBC.  Severe protein calorie malnutrition Underweight: Estimated body mass index is 17.79 kg/m as calculated from the following:   Height as of this encounter: 6' (1.829 m).   Weight as of this encounter: 59.5 kg.   Code status:   Code Status: Full Code   DVT Prophylaxis: heparin injection 5,000 Units Start: 04/02/22 0600   Family Communication: Son-[name] [phone] updated on 10/9   Disposition Plan: Status is: Inpatient Remains inpatient  appropriate because: Initiating PEG tube feeding today-once stable for discharge per surgery- will likely go home over the next day or so.   Planned Discharge Destination:Home   Diet: Diet Order             DIET - DYS 1 Room service appropriate? Yes; Fluid consistency: Thin   Diet effective now                     Antimicrobial agents: Anti-infectives (From admission, onward)    Start     Dose/Rate Route Frequency Ordered Stop   04/04/22 1330  piperacillin-tazobactam (ZOSYN) IVPB 3.375 g        3.375 g 12.5 mL/hr over 240 Minutes Intravenous Every 8 hours 04/04/22 1240     04/02/22 0600  Ampicillin-Sulbactam (UNASYN) 3 g in sodium chloride 0.9 % 100 mL IVPB  Status:  Discontinued        3 g 200 mL/hr over 30 Minutes Intravenous Every 12 hours 04/02/22 0516 04/04/22 1148   04/01/22 2200  cefTRIAXone (ROCEPHIN) 1 g in sodium chloride 0.9 % 100 mL IVPB        1 g 200 mL/hr over 30 Minutes Intravenous  Once 04/01/22 2146 04/01/22 2308   04/01/22 2200  azithromycin (ZITHROMAX) 500 mg in sodium chloride 0.9 % 250 mL IVPB        500 mg 250 mL/hr over 60 Minutes Intravenous  Once 04/01/22 2146 04/01/22 2358        MEDICATIONS: Scheduled Meds:  bisoprolol  5 mg Oral Daily   feeding supplement  237 mL Oral TID BM   ferrous sulfate  325 mg Oral Daily   heparin  5,000 Units Subcutaneous Q8H   levothyroxine  50 mcg Oral Q0600   sodium chloride flush  3 mL Intravenous Q12H   thiamine  200 mg Oral Daily   Continuous Infusions:  lactated ringers 50 mL/hr at 04/06/22 2233   piperacillin-tazobactam (ZOSYN)  IV 3.375 g (04/07/22 0714)   PRN Meds:.acetaminophen **OR** acetaminophen, labetalol, ondansetron **OR** ondansetron (ZOFRAN) IV   I have personally reviewed following labs and imaging studies  LABORATORY DATA: CBC: Recent Labs  Lab 03/31/22 1636 04/01/22 2013 04/03/22 0538 04/04/22 0151 04/05/22 0039  WBC 13.9* 6.2 7.0 5.6 4.4  NEUTROABS 12.3* 5.8  --   --   --   HGB 10.2* 10.7* 8.6* 9.1* 8.5*  HCT 30.5* 31.4* 24.7* 26.7* 25.4*  MCV 86.4 83.5 83.4 84.2 84.9  PLT 288 316 257 300 294     Basic Metabolic Panel: Recent Labs  Lab 03/31/22 1636 04/01/22 2013 04/02/22 0538 04/03/22 0538 04/04/22 0559 04/05/22 0039 04/06/22 0029  04/07/22 0238  NA 129* 129* 132* 136 138  --   --   --   K 4.2 3.9 3.8 3.5 3.8  --   --   --   CL 97* 93* 99 105 107  --   --   --   CO2 22 22 22 23 24   --   --   --   GLUCOSE 91 102* 83 93 106*  --   --   --   BUN 65* 70* 63* 52* 44*  --   --   --   CREATININE 2.49* 2.15* 1.96* 1.53* 1.23  --   --   --   CALCIUM 9.2 9.9 9.0 9.0 9.3  --   --   --   MG  --   --   --  2.0  --   --   --   --  PHOS  --   --   --  2.7 2.9 3.2 3.0 3.3     GFR: Estimated Creatinine Clearance: 36.3 mL/min (by C-G formula based on SCr of 1.23 mg/dL).  Liver Function Tests: Recent Labs  Lab 03/31/22 1636  AST 16  ALT 9  ALKPHOS 52  BILITOT 1.0  PROT 7.1  ALBUMIN 3.1*    No results for input(s): "LIPASE", "AMYLASE" in the last 168 hours. No results for input(s): "AMMONIA" in the last 168 hours.  Coagulation Profile: No results for input(s): "INR", "PROTIME" in the last 168 hours.  Cardiac Enzymes: No results for input(s): "CKTOTAL", "CKMB", "CKMBINDEX", "TROPONINI" in the last 168 hours.  BNP (last 3 results) No results for input(s): "PROBNP" in the last 8760 hours.  Lipid Profile: No results for input(s): "CHOL", "HDL", "LDLCALC", "TRIG", "CHOLHDL", "LDLDIRECT" in the last 72 hours.  Thyroid Function Tests: No results for input(s): "TSH", "T4TOTAL", "FREET4", "T3FREE", "THYROIDAB" in the last 72 hours.   Anemia Panel: No results for input(s): "VITAMINB12", "FOLATE", "FERRITIN", "TIBC", "IRON", "RETICCTPCT" in the last 72 hours.   Urine analysis:    Component Value Date/Time   COLORURINE YELLOW 04/02/2022 0600   APPEARANCEUR HAZY (A) 04/02/2022 0600   LABSPEC 1.011 04/02/2022 0600   PHURINE 5.0 04/02/2022 0600   GLUCOSEU NEGATIVE 04/02/2022 0600   HGBUR SMALL (A) 04/02/2022 0600   BILIRUBINUR NEGATIVE 04/02/2022 0600   KETONESUR NEGATIVE 04/02/2022 0600   PROTEINUR NEGATIVE 04/02/2022 0600   UROBILINOGEN 0.2 12/03/2021 1921   NITRITE NEGATIVE 04/02/2022 0600   LEUKOCYTESUR  MODERATE (A) 04/02/2022 0600    Sepsis Labs: Lactic Acid, Venous    Component Value Date/Time   LATICACIDVEN 1.1 04/01/2022 2305    MICROBIOLOGY: Recent Results (from the past 240 hour(s))  Resp Panel by RT-PCR (Flu A&B, Covid) Anterior Nasal Swab     Status: None   Collection Time: 04/01/22  9:56 PM   Specimen: Anterior Nasal Swab  Result Value Ref Range Status   SARS Coronavirus 2 by RT PCR NEGATIVE NEGATIVE Final    Comment: (NOTE) SARS-CoV-2 target nucleic acids are NOT DETECTED.  The SARS-CoV-2 RNA is generally detectable in upper respiratory specimens during the acute phase of infection. The lowest concentration of SARS-CoV-2 viral copies this assay can detect is 138 copies/mL. A negative result does not preclude SARS-Cov-2 infection and should not be used as the sole basis for treatment or other patient management decisions. A negative result may occur with  improper specimen collection/handling, submission of specimen other than nasopharyngeal swab, presence of viral mutation(s) within the areas targeted by this assay, and inadequate number of viral copies(<138 copies/mL). A negative result must be combined with clinical observations, patient history, and epidemiological information. The expected result is Negative.  Fact Sheet for Patients:  BloggerCourse.com  Fact Sheet for Healthcare Providers:  SeriousBroker.it  This test is no t yet approved or cleared by the [place] FDA and  has been authorized for detection and/or diagnosis of SARS-CoV-2 by FDA under an Emergency Use Authorization (EUA). This EUA will remain  in effect (meaning this test can be used) for the duration of the COVID-19 declaration under Section 564(b)(1) of the Act, 21 U.S.C.section 360bbb-3(b)(1), unless the authorization is terminated  or revoked sooner.       Influenza A by PCR NEGATIVE NEGATIVE Final   Influenza B by PCR NEGATIVE  NEGATIVE Final    Comment: (NOTE) The Xpert Xpress SARS-CoV-2/FLU/RSV plus assay is intended as an aid in the  diagnosis of influenza from Nasopharyngeal swab specimens and should not be used as a sole basis for treatment. Nasal washings and aspirates are unacceptable for Xpert Xpress SARS-CoV-2/FLU/RSV testing.  Fact Sheet for Patients: BloggerCourse.com  Fact Sheet for Healthcare Providers: SeriousBroker.it  This test is not yet approved or cleared by the [place] FDA and has been authorized for detection and/or diagnosis of SARS-CoV-2 by FDA under an Emergency Use Authorization (EUA). This EUA will remain in effect (meaning this test can be used) for the duration of the COVID-19 declaration under Section 564(b)(1) of the Act, 21 U.S.C. section 360bbb-3(b)(1), unless the authorization is terminated or revoked.  Performed at [facility], [address], [place], [place] [address]   Blood culture (routine x 2)     Status: None   Collection Time: 04/01/22 10:05 PM   Specimen: BLOOD  Result Value Ref Range Status   Specimen Description   Final    BLOOD Performed at [facility], [address] [place], [place] [address]    Special Requests   Final    NONE Performed at [facility], [address] [place], [place] [address]    Culture   Final    NO GROWTH 5 DAYS Performed at [facility], [address], [place], [place] [address]    Report Status 04/07/2022 FINAL  Final  Blood culture (routine x 2)     Status: None   Collection Time: 04/01/22 10:10 PM   Specimen: BLOOD  Result Value Ref Range Status   Specimen Description   Final    BLOOD Performed at [facility], [address] [place], [place] [address]    Special Requests   Final    NONE Performed at [facility], [address] [place], [place]  [address]    Culture   Final    NO GROWTH 5 DAYS Performed at [facility], [address], [place], [place] [address]    Report Status 04/07/2022 FINAL  Final  Expectorated Sputum Assessment w Gram Stain, Rflx to Resp Cult     Status: None   Collection Time: 04/02/22 10:00 AM   Specimen: Sputum  Result Value Ref Range Status   Specimen Description SPU  Final   Special Requests EXPECTORATED  Final   Sputum evaluation   Final    THIS SPECIMEN IS ACCEPTABLE FOR SPUTUM CULTURE Performed at [facility], [address] [place], [place] [address]    Report Status 04/02/2022 FINAL  Final  Culture, Respiratory w Gram Stain     Status: None   Collection Time: 04/02/22 10:00 AM   Specimen: Sputum  Result Value Ref Range Status   Specimen Description SPU  Final   Special Requests EXPECTORATED Reflexed from [id number]  Final   Gram Stain   Final    RARE WBC PRESENT, PREDOMINANTLY PMN FEW GRAM NEGATIVE RODS RARE GRAM POSITIVE COCCI    Culture   Final    FEW PSEUDOMONAS AERUGINOSA WITHIN MIXED ORGANISMS Performed at [facility], [address] [place], [place] [address]    Report Status 04/06/2022 FINAL  Final   Organism ID, Bacteria PSEUDOMONAS AERUGINOSA  Final      Susceptibility   Pseudomonas aeruginosa - MIC*    CEFTAZIDIME 4 SENSITIVE Sensitive     CIPROFLOXACIN <=0.25 SENSITIVE Sensitive     GENTAMICIN 2 SENSITIVE Sensitive     IMIPENEM 2 SENSITIVE Sensitive     PIP/TAZO  8 SENSITIVE Sensitive     CEFEPIME 2 SENSITIVE Sensitive     * FEW PSEUDOMONAS AERUGINOSA    RADIOLOGY STUDIES/RESULTS: No results found.   LOS: 5 days   Jeoffrey Massed, MD  [facility]    To contact the attending provider between 7A-7P or the covering provider during after hours 7P-7A, please log into the web site [email or web address] and access using universal [place] password for that web site. If you do not have the password, please call the hospital operator.  04/07/2022,  11:06 AM

## 2022-04-07 NOTE — Progress Notes (Addendum)
Initial Nutrition Assessment  DOCUMENTATION CODES:   Severe malnutrition in context of chronic illness, Underweight  INTERVENTION:   Tube Feeds via PEG: Start Jevity 1.2 at 20 mL/hr and advance by 10 mL q8h to goal rate of 60 mL/hr. (1440 mL/day) ProSource TF20 = daily 135 mL free water flush q6h  Provides 1808 kcal, 100 grams of protein, and 1702 mL free water daily. Monitor magnesium, potassium, and phosphorus BID for at least 3 days, MD to replete as needed, as pt is at risk for refeeding syndrome given poor PO and severe malnutrition. Decrease Ensure Enlive po BID, each supplement provides 350 kcal and 20 grams of protein. Encourage good PO intake As pt tolerates tube feeds, are able to transition to bolus feeds via PEG: 1 carton of Jevity 1.2 - six times per day (1422 mL/day) 50 mL free water flush before and after each bolus Provides 1706 kcal, 79 gm protein, and 1746 mL total free water daily.   NUTRITION DIAGNOSIS:   Severe Malnutrition related to chronic illness (dysphagia) as evidenced by percent weight loss, severe muscle depletion, severe fat depletion.  GOAL:   Patient will meet greater than or equal to 90% of their needs  MONITOR:   PO intake, TF tolerance, Labs, I & O's, Weight trends  REASON FOR ASSESSMENT:   Consult Enteral/tube feeding initiation and management  ASSESSMENT:   88 y.o. male presented to the ED with dysphagia for multiple months, SOB, and cough. PMH includes  HTN, anemia, and gout. Pt admitted with aspiration pneumonia, hyponatremia, and AKI on CKD.   10/06 - admitted 10/07 - diet advanced to dysphagia 1, Honey Thick liquids 10/08 - EGD; liquids advanced to thin liquids 10/11 - PEG placed   Met with pt, video interpreter was used during RD visit. Pt reports that he was eating good prior to coming to the hospital. States that he has not ate since being here. Denies any weight loss. RD explained the use of his PEG tube and that he will be  getting fed via that tube, although he can still eat by mouth.  Per EMR, pt has had a 10% weight loss within 3 months, this is clinically significant for time frame.  Discussed case with RN, RN reports that pt has only took a few bites and has not been eating great.   Medications reviewed and include: Ferrous Sulfate, Thiamine, IV antibiotics  Labs reviewed.  NUTRITION - FOCUSED PHYSICAL EXAM:  Flowsheet Row Most Recent Value  Orbital Region Moderate depletion  Upper Arm Region Severe depletion  Thoracic and Lumbar Region Severe depletion  Buccal Region Moderate depletion  Temple Region Moderate depletion  Clavicle Bone Region Severe depletion  Clavicle and Acromion Bone Region Severe depletion  Scapular Bone Region Severe depletion  Dorsal Hand Moderate depletion  Patellar Region Severe depletion  Anterior Thigh Region Severe depletion  Posterior Calf Region Severe depletion  Edema (RD Assessment) None  Hair Reviewed  Eyes Reviewed  Mouth Reviewed  Skin Reviewed  Nails Reviewed   Diet Order:   Diet Order             DIET - DYS 1 Room service appropriate? Yes; Fluid consistency: Thin  Diet effective now                   EDUCATION NEEDS:   No education needs have been identified at this time  Skin:  Skin Assessment: Reviewed RN Assessment  Last BM:  10/10  Height:  Ht Readings from Last 1 Encounters:  04/06/22 6' (1.829 m)    Weight:   Wt Readings from Last 1 Encounters:  04/06/22 59.5 kg    Ideal Body Weight:  80.9 kg  BMI:  Body mass index is 17.79 kg/m.  Estimated Nutritional Needs:   Kcal:  1700-1900  Protein:  85-100 grams  Fluid:  >/= 1.7 L   Hermina Barters RD, LDN Clinical Dietitian See [facility] for contact information.

## 2022-04-08 ENCOUNTER — Other Ambulatory Visit: Payer: Self-pay

## 2022-04-08 ENCOUNTER — Other Ambulatory Visit: Payer: Self-pay | Admitting: Internal Medicine

## 2022-04-08 ENCOUNTER — Other Ambulatory Visit (HOSPITAL_COMMUNITY): Payer: Self-pay

## 2022-04-08 DIAGNOSIS — I10 Essential (primary) hypertension: Secondary | ICD-10-CM

## 2022-04-08 DIAGNOSIS — E43 Unspecified severe protein-calorie malnutrition: Secondary | ICD-10-CM | POA: Insufficient documentation

## 2022-04-08 DIAGNOSIS — I872 Venous insufficiency (chronic) (peripheral): Secondary | ICD-10-CM

## 2022-04-08 LAB — BASIC METABOLIC PANEL
Anion gap: 9 (ref 5–15)
BUN: 31 mg/dL — ABNORMAL HIGH (ref 8–23)
CO2: 26 mmol/L (ref 22–32)
Calcium: 9.5 mg/dL (ref 8.9–10.3)
Chloride: 104 mmol/L (ref 98–111)
Creatinine, Ser: 1.46 mg/dL — ABNORMAL HIGH (ref 0.61–1.24)
GFR, Estimated: 47 mL/min — ABNORMAL LOW (ref >60)
Glucose, Bld: 118 mg/dL — ABNORMAL HIGH (ref 70–99)
Potassium: 4.3 mmol/L (ref 3.5–5.1)
Sodium: 139 mmol/L (ref 135–145)

## 2022-04-08 LAB — PHOSPHORUS
Phosphorus: 2.8 mg/dL (ref 2.5–4.6)
Phosphorus: 2.4 mg/dL — ABNORMAL LOW (ref 2.5–4.6)

## 2022-04-08 LAB — MAGNESIUM
Magnesium: 2.1 mg/dL (ref 1.7–2.4)
Magnesium: 1.9 mg/dL (ref 1.7–2.4)

## 2022-04-08 LAB — GLUCOSE, CAPILLARY
Glucose-Capillary: 101 mg/dL — ABNORMAL HIGH (ref 70–99)
Glucose-Capillary: 105 mg/dL — ABNORMAL HIGH (ref 70–99)
Glucose-Capillary: 109 mg/dL — ABNORMAL HIGH (ref 70–99)
Glucose-Capillary: 112 mg/dL — ABNORMAL HIGH (ref 70–99)
Glucose-Capillary: 114 mg/dL — ABNORMAL HIGH (ref 70–99)
Glucose-Capillary: 131 mg/dL — ABNORMAL HIGH (ref 70–99)
Glucose-Capillary: 134 mg/dL — ABNORMAL HIGH (ref 70–99)

## 2022-04-08 MED ORDER — JEVITY 1 CAL/FIBER PO LIQD
1.0000 | Freq: Every day | ORAL | 11 refills | Status: DC
  Filled 2022-04-08: qty 45000, fill #0

## 2022-04-08 MED ORDER — FREE WATER
11 refills | Status: DC
  Filled 2022-04-08: qty 9000, fill #0

## 2022-04-08 MED ORDER — FUROSEMIDE 20 MG PO TABS
20.0000 mg | ORAL_TABLET | Freq: Every day | ORAL | 1 refills | Status: DC
  Filled 2022-04-08: qty 90, 90d supply, fill #0

## 2022-04-08 MED ORDER — JEVITY 1.2 CAL PO LIQD: 1000.0000 mL | ORAL | Status: DC

## 2022-04-08 MED ORDER — JEVITY 1 CAL/FIBER PO LIQD
1.0000 | Freq: Every day | ORAL | 11 refills | Status: AC
  Filled 2022-04-08: qty 45000, fill #0

## 2022-04-08 MED ORDER — LEVOFLOXACIN 750 MG PO TABS
750.0000 mg | ORAL_TABLET | Freq: Once | ORAL | Status: AC
  Administered 2022-04-08: 750 mg via ORAL
  Filled 2022-04-08: qty 1

## 2022-04-08 NOTE — Progress Notes (Signed)
Physical Therapy Treatment Patient Details Name: [name] [name] MRN: [id number] DOB: [birth year] Today's Date: 04/08/2022   History of Present Illness [name] [name] is a pleasant 88 y.o. male who presents to the emergency department with dysphagia, shortness of breath, and cough.  His work-up was significant for aspiration of pneumonia, due to worsening dysphagia over the last month, he has CT significant for DISH. PEG tube placed 04/06/22. Orthostatic hypotension in PT session on 10/12. PMH: hypertension, hypothyroidism, and BPH    PT Comments    Pt received in supine, agreeable to therapy session and with good participation and fair tolerance for seated/standing tasks, pt limited due to symptomatic orthostatic hypotension. Pt at times with delayed response to PTA/translator during session and intermittently endorses lightheadedness with transfers. He reported he had this "sometimes" at home prior to admission. Pt instructed on seated/supine LE HEP for strengthening and improved hemodynamics with teach-back as detailed below. Pt may benefit from thigh high TED hose for improved hemodynamic stability and may need to consider wheelchair DME for DC home if BP remains soft with standing tasks. Plan to progress gait/stairs next session if PT more stable. Pt continues to benefit from PT services to progress toward functional mobility goals.  Orthostatic BP -taken in Right UE Supine 141/54 (84) HR 67 bpm  Sitting (lightheaded) 103/47 (60) HR 77 bpm  Sitting after 5 min 100/48 (67)   Orthostatic BP- taken in Left UE  Sitting (~5 mins after above reading taken, not lightheaded) 153/64 (91) HR 79 bpm  Sitting after stand pivot to BSC 105/41 (64)  Standing 87/41 (50)  Sitting EOB after standing 88/41 (56)  Supine, BLE elevated on pillow 124/66 (86)       Recommendations for follow up therapy are one component of a multi-disciplinary discharge planning process, led by the attending physician.  Recommendations  may be updated based on patient status, additional functional criteria and insurance authorization.  Follow Up Recommendations  Home health PT (consider OPPT if HHPT not available given uninsured status)     Assistance Recommended at Discharge Frequent or constant Supervision/Assistance  Patient can return home with the following A little help with walking and/or transfers;A lot of help with bathing/dressing/bathroom;Assistance with cooking/housework;Direct supervision/assist for medications management;Direct supervision/assist for financial management;Assist for transportation;Help with stairs or ramp for entrance   Equipment Recommendations  Rolling walker (2 wheels);BSC/3in1 (pt not currently ambulating household distances therefore will need 3in1 and may need wheelchair pending progress with mobility)    Recommendations for Other Services       Precautions / Restrictions Precautions Precautions: None Precaution Comments: symptomatic orthostatic hypotension Restrictions Weight Bearing Restrictions: No     Mobility  Bed Mobility Overal bed mobility: Needs Assistance Bed Mobility: Rolling, Sidelying to Sit, Sit to Supine Rolling: Min guard Sidelying to sit: Min assist, HOB elevated   Sit to supine: Min assist, HOB elevated   General bed mobility comments: cues for log rolling, assist with BLE and trunk raising.    Transfers Overall transfer level: Needs assistance Equipment used: Rolling walker (2 wheels) Transfers: Sit to/from Stand Sit to Stand: Min assist, Mod assist           General transfer comment: min A for power up and steadying with RW; from EOB with modA initially then minA subsequent transfers to/from [facility] and EOB.    Ambulation/Gait Ambulation/Gait assistance: Min assist Gait Distance (Feet): 5 Feet Assistive device: Rolling walker (2 wheels) Gait Pattern/deviations: Step-through pattern, Trunk flexed, Narrow base of support, Shuffle  General  Gait Details: pivotal steps bed>BSC>bed, pt with continued lightheadedness sitting on BSC, orthostatics + for hypotension so defer longer distance gait trial for pt safety.   Stairs Stairs:  (defer given + orthostatic hypotension)             Balance Overall balance assessment: Needs assistance Sitting-balance support: Feet supported Sitting balance-Leahy Scale: Fair     Standing balance support: Bilateral upper extremity supported, Reliant on assistive device for balance Standing balance-Leahy Scale: Poor Standing balance comment: posterior lean with dynamic standing tasks/weight shifting in RW                            Cognition Arousal/Alertness: Awake/alert Behavior During Therapy: WFL for tasks assessed/performed Overall Cognitive Status: Within Functional Limits for tasks assessed                                 General Comments: Pleasantly cooperative, occasionally responding to arabic translator on Ipad in [place] (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 When I prompt questions/commands to him in english or when translator does in Arabic, frequently he does not respond (~25-30% of the time). Per translator, maybe a difference in dialect, could also be slow processing/mild confusion? Pt was told by RN that his DC was being held due to low BP when working with PT and 5 mins later when I came in to grab something from the room, he asked me what the matter was, why he wasn't going to DC, maybe some mild short term memory deficits.        Exercises Other Exercises Other Exercises: seated BLE AROM: hip flexion, LAQ x10 reps ea Other Exercises: STS x 3 reps for strengthening Other Exercises: supine BLE AROM: ankle pumps, heel slides, marching x10 reps ea for teach-back of HEP Other Exercises: HEP link: [email or web address] Access Code: [email or web address]    General Comments General comments (skin integrity, edema, etc.):  Translation via [employer] lang interpreter Zina # [id number] on iPad. BP MAP (84) initially supine, resting HR 60's bpm, standing BP MAP (60), improved to (67) after ~5 mins and seated exercises, then MAP (91) on other arm after sitting ~8 mins. BP MAP (50) standing at bedside and then (86) supine with BLE elevated on pillow (HOB ~30* due to feeding tube).      Pertinent Vitals/Pain Pain Assessment Pain Assessment: No/denies pain Pain Intervention(s): Monitored during session, Repositioned           PT Goals (current goals can now be found in the care plan section) Acute Rehab PT Goals Patient Stated Goal: to feel better and go home PT Goal Formulation: With patient/family Time For Goal Achievement: 04/18/22 Progress towards PT goals: Progressing toward goals (slowly due to unstable BP)    Frequency    Min 3X/week      PT Plan Current plan remains appropriate       AM-PAC PT "6 Clicks" Mobility   Outcome Measure  Help needed turning from your back to your side while in a flat bed without using bedrails?: A Little Help needed moving from lying on your back to sitting on the side of a flat bed without using bedrails?: A Little Help needed moving to and from a bed to a chair (including a wheelchair)?: A Little Help needed standing up from a chair using your arms (e.g.,  wheelchair or bedside chair)?: A Little Help needed to walk in hospital room?: Total (<79ft for pt safety) Help needed climbing 3-5 steps with a railing? : Total 6 Click Score: 14    End of Session Equipment Utilized During Treatment: Gait belt Activity Tolerance: Treatment limited secondary to medical complications (Comment) (symptomatic orthostatic hypotension) Patient left: with call bell/phone within reach;in bed;with bed alarm set Nurse Communication: Mobility status;Other (comment) (MD/RN notified of BPs, pt may benefit from TED hose and WC for DC?) PT Visit Diagnosis: Other abnormalities of gait and mobility  (R26.89);Muscle weakness (generalized) (M62.81);Difficulty in walking, not elsewhere classified (R26.2)     Time: 5003-7048 PT Time Calculation (min) (ACUTE ONLY): 44 min  Charges:  $Therapeutic Exercise: 8-22 mins $Therapeutic Activity: 23-37 mins                     Nhan Qualley P., PTA Acute Rehabilitation Services Secure Chat Preferred 9a-5:30pm Office: [place] 04/08/2022, 12:19 PM

## 2022-04-08 NOTE — Discharge Summary (Addendum)
PATIENT DETAILS Name: [name] [name] Age: 88 y.o. Sex: male Date of Birth: 02-Jun-1934 MRN: [id number]. Admitting Physician: Briscoe Deutscher, MD RSW:NIOEVOJ, Binnie Rail, MD  Admit Date: 04/01/2022 Discharge date: 04/09/2022  Recommendations for Outpatient Follow-up:  Follow up with PCP in 1-2 weeks Please obtain CMP/CBC in one week  Admitted From:  Home  Disposition: Home   Discharge Condition: fair  CODE STATUS:   Code Status: Full Code   Diet recommendation:  Diet Order             Diet - low sodium heart healthy           DIET - DYS 1 Room service appropriate? Yes; Fluid consistency: Thin  Diet effective now                    Brief Summary: Patient is a 88 y.o.  male with history of HTN, hypothyroidism who presented with dysphagia (several months), cough-he was found to have aspiration pneumonia.  See below for further details.   Significant events: 10/6>> admit to [facility]-aspiration pneumonia   Significant studies: 10/6>> CXR: Patchy infiltrates at bilateral lung bases. 10/6>> CT head: No acute intracranial abnormality 10/6>> CT soft tissue neck: Consistent with DISH 10/8>> MRI C-spine: Multilevel spondylosis of the cervical spine.   Significant microbiology data: 10/6>> COVID/influenza PCR: Negative 10/6>> blood culture: No growth 10/7>> sputum culture: Pseudomonas   Procedures: 10/8>> EGD: Normal esophagus/stomach 10/11>> laparoscopic PEG tube placement   Consults: Neurosurgery GI IR General surgery    Brief Hospital Course: Aspiration PNA Afebrile-no leukocytosis. Overall clinically improved. Sputum cultures positive for Pseudomonas Completed 5 days of Zosyn-we will get 1 dose of levofloxacin prior to discharge.  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 After discussion with family/patient-since patient not able to meet nutrition needs via oral intake (minimal oral intake)-PEG tube placed by general surgery on 10/11.  Tolerating PEG tube feeds without any issues, will be switched to bolus regimen on discharge. Discussed with case manager-attempts ongoing to arrange for outpatient assistance/home health services on discharge.   Hyponatremia Resolved with IVF Etiology likely due to dehydration/poor oral intake   DISH Appreciate neurosurgical input-no benefit from surgery.   Hypothyroidism Continue Synthroid   HTN BP better-continue bisoprolol Holding Cardura due to orthostatic hypotension-allow some amount of permissive hypertension.  Orthostatic hypotension Patient-acknowledges intermittent dizzy spells when standing up even prior to this hospitalization TED stockings ordered Cardura held Stop Lasix (listed in home medication) Finesse volume status has been stable Slightly orthostatic this morning but asymptomatic   Normocytic anemia Iron deficiency Hemoglobin on the lower side-this is mostly from acute illness/IV fluid dilution..  No evidence of GI bleeding-EGD negative. Continue oral iron supplementation Follow CBC.   Severe protein calorie malnutrition Underweight: Estimated body mass index is 17.79 kg/m as calculated from the following:   Height as of this encounter: 6' (1.829 m).   Weight as of this encounter: 59.5 kg.   Nutrition Status: Nutrition Problem: Severe Malnutrition Etiology: chronic illness (dysphagia) Signs/Symptoms: percent weight loss, severe muscle depletion, severe fat depletion Percent weight loss: 10 % Interventions: Tube feeding, Ensure Enlive (each supplement provides 350kcal and 20 grams of protein), MVI    Discharge Diagnoses:  Principal Problem:   Aspiration pneumonia (HCC) Active Problems:   Hypothyroidism   Essential hypertension  Dysphagia   Hyponatremia   Elevated  troponin   Acute renal failure superimposed on stage 3a chronic kidney disease (HCC)   Protein-calorie malnutrition, severe   Discharge Instructions:  Activity:  As tolerated   Discharge Instructions     Ambulatory referral to Occupational Therapy   Complete by: As directed    Ambulatory referral to Physical Therapy   Complete by: As directed    Ambulatory referral to Speech Therapy   Complete by: As directed    Call MD for:  difficulty breathing, headache or visual disturbances   Complete by: As directed    Call MD for:  persistant nausea and vomiting   Complete by: As directed    Call MD for:  redness, tenderness, or signs of infection (pain, swelling, redness, odor or green/yellow discharge around incision site)   Complete by: As directed    Call MD for:  severe uncontrolled pain   Complete by: As directed    Diet - low sodium heart healthy   Complete by: As directed    Discharge instructions   Complete by: As directed    Follow with Primary MD  Marcine Matar, MD in 1-2 weeks  Please get a complete blood count and chemistry panel checked by your Primary MD at your next visit, and again as instructed by your Primary MD.  Get Medicines reviewed and adjusted: Please take all your medications with you for your next visit with your Primary MD  Laboratory/radiological data: Please request your Primary MD to go over all hospital tests and procedure/radiological results at the follow up, please ask your Primary MD to get all Hospital records sent to his/her office.  In some cases, they will be blood work, cultures and biopsy results pending at the time of your discharge. Please request that your primary care M.D. follows up on these results.  Also Note the following: If you experience worsening of your admission symptoms, develop shortness of breath, life threatening emergency, suicidal or homicidal thoughts you must seek medical attention immediately by calling 911 or  calling your MD immediately  if symptoms less severe.  You must read complete instructions/literature along with all the possible adverse reactions/side effects for all the Medicines you take and that have been prescribed to you. Take any new Medicines after you have completely understood and accpet all the possible adverse reactions/side effects.   Do not drive when taking Pain medications or sleeping medications (Benzodaizepines)  Do not take more than prescribed Pain, Sleep and Anxiety Medications. It is not advisable to combine anxiety,sleep and pain medications without talking with your primary care practitioner  Special Instructions: If you have smoked or chewed Tobacco  in the last 2 yrs please stop smoking, stop any regular Alcohol  and or any Recreational drug use.  Wear Seat belts while driving.  Please note: You were cared for by a hospitalist during your hospital stay. Once you are discharged, your primary care physician will handle any further medical issues. Please note that NO REFILLS for any discharge medications will be authorized once you are discharged, as it is imperative that you return to your primary care physician (or establish a relationship with a primary care physician if you do not have one) for your post hospital discharge needs so that they can reassess your need for medications and monitor your lab values.   Increase activity slowly   Complete by: As directed    No dressing needed   Complete by: As directed  Allergies as of 04/09/2022   No Known Allergies      Medication List     STOP taking these medications    doxazosin 4 MG tablet Commonly known as: CARDURA   furosemide 20 MG tablet Commonly known as: LASIX       TAKE these medications    allopurinol 100 MG tablet Commonly known as: ZYLOPRIM Take 1 tablet (100 mg total) by mouth once daily.   aspirin EC 81 MG tablet Take 1 tablet (81 mg total) by mouth daily. Swallow whole.    bisoprolol 5 MG tablet Commonly known as: ZEBETA Take 1 tablet (5 mg total) by mouth once daily. What changed:  when to take this reasons to take this   cetirizine 10 MG tablet Commonly known as: ZYRTEC Take 1 tablet (10 mg total) by mouth daily.   FeroSul 325 (65 FE) MG tablet Generic drug: ferrous sulfate Take 1 tablet (325 mg) by mouth every other day. What changed: when to take this   fluticasone 50 MCG/ACT nasal spray Commonly known as: FLONASE Place 2 sprays into both nostrils daily. What changed:  when to take this reasons to take this   free water Soln 50 mL free water flush via PEG tube before and after each bolus   Jevity 1 Cal/Fiber Liqd 1 Can by PEG Tube route 6 (six) times daily.   levothyroxine 50 MCG tablet Commonly known as: SYNTHROID Take 1 tablet (50 mcg total) by mouth once daily before breakfast.   triamcinolone cream 0.1 % Commonly known as: KENALOG Apply 1 application topically to the affected area(s) 2 (two) times daily.               Durable Medical Equipment  (From admission, onward)           Start     Ordered   04/04/22 1551  For home use only DME Walker rolling  Once       Question Answer Comment  Walker: With 5 Inch Wheels   Patient needs a walker to treat with the following condition Weakness      04/04/22 1551   04/04/22 1551  For home use only DME Bedside commode  Once       Question:  Patient needs a bedside commode to treat with the following condition  Answer:  Weakness   04/04/22 1551              Discharge Care Instructions  (From admission, onward)           Start     Ordered   04/08/22 0000  No dressing needed        04/08/22 0906            Follow-up Information     [facility] Follow up.   Specialty: Rehabilitation Why: Call to schedule apt for physical, occupational, and speech therapy Contact information: [address] [id number] [place] [address] [phone]        Marcine Matar, MD. Schedule an appointment as soon as possible for a visit in 1 week(s).   Specialty: Internal Medicine Contact information: [address] [place] [address] [phone]         [name] Follow up.   Why: Feeding formula and equipment company Contact information: [address] [place] [address] [phone]  No Known Allergies   Other Procedures/Studies: CT ABDOMEN WO CONTRAST  Result Date: 04/05/2022 CLINICAL DATA:  Evaluate gastric anatomy prior to potential percutaneous gastrostomy tube placement. EXAM: CT ABDOMEN WITHOUT CONTRAST TECHNIQUE: Multidetector CT imaging of the abdomen was performed following the standard protocol without IV contrast. RADIATION DOSE REDUCTION: This exam was performed according to the departmental dose-optimization program which includes automated exposure control, adjustment of the mA and/or kV according to patient size and/or use of iterative reconstruction technique. COMPARISON:  Chest CT-12/10/2021 FINDINGS: Lower chest: Limited visualization of the lower thorax demonstrates trace bilateral effusions with associated bibasilar slightly nodular consolidative airspace opacities with associated air bronchograms. Ill-defined areas of ground-glass are seen within the imaged portions of the right middle lobe and lingula. Normal heart size. Coronary artery calcifications. Trace amount of pericardial fluid, presumably physiologic. Hepatobiliary: Normal hepatic contour. Normal noncontrast appearance of the gallbladder given degree of distention. No radiopaque gallstones or biliary sludge. No ascites. Pancreas: Normal noncontrast appearance of the pancreas. Spleen: Normal noncontrast appearance of the spleen. Adrenals/Urinary Tract: Normal noncontrast appearance of the bilateral kidneys. Linear  calcifications about the right renal hila are favored to be vascular in etiology. No definite evidence of nephrolithiasis. No urinary obstruction. Normal noncontrast appearance the bilateral adrenal glands. The urinary bladder was not imaged. Stomach/Bowel: The transverse colon is interposed between the anterior aspect of the stomach and ventral abdominal wall. Ingested enteric contrast extends to the imaged distal descending colon. The cecum is noted to be located within the midline of the abdomen. Normal noncontrast appearance of the terminal ileum and appendix. No significant hiatal hernia. No pneumoperitoneum, pneumatosis or portal venous gas. Vascular/Lymphatic: Atherosclerotic plaque within a normal caliber abdominal aorta. Other: Mild diffuse body wall anasarca. Musculoskeletal: No acute or aggressive osseous abnormalities. Post L4-L5 paraspinal fusion and intervertebral disc space replacement without evidence of hardware failure or loosening. Moderate DDD is seen within the remainder of the lumbar spine, worse at T12-L1 and L2-L3 with disc space height loss, endplate irregularity and sclerosis. Bulky bridging syndesmophytes are noted about the anterior aspects of nearly all imaged thoracic and lumbar intervertebral disc spaces. IMPRESSION: 1. Suboptimal percutaneous gastrostomy window with interposition of the transverse colon between the anterior wall of the stomach and ventral wall of the abdomen. If gastrostomy tube placement is still desired, surgical consultation is advised. If the patient is deemed a poor surgical candidate, percutaneous gastrostomy tube placement could be attempted with gastric insufflation, however would recommend either the patient receiving barium the night before the procedure (if no obstructive or ileus symptoms) or an enema on the fluoroscopy table to adequately demarcate the transverse colon. 2. Trace bilateral effusions with associated bibasilar slightly nodular airspace  opacities with associated air bronchograms and scattered ill-defined areas of ground-glass, constellation of findings worrisome for multifocal infection and/or aspiration. 3. Coronary artery calcifications. Aortic Atherosclerosis (ICD10-I70.0). Electronically Signed   By: Simonne Come M.D.   On: 04/05/2022 08:06   DG Swallowing Func-Speech Pathology  Result Date: 04/04/2022 Table formatting from the original result was not included. Images from the original result were not included. Objective Swallowing Evaluation: Type of Study: MBS-Modified Barium Swallow Study  Patient Details Name: Saleem Coccia MRN: [id number] Date of Birth: 04-02-1934 Today's Date: 04/04/2022 Time: SLP Start Time (ACUTE ONLY): 0820 -SLP Stop Time (ACUTE ONLY): 0900 SLP Time Calculation (min) (ACUTE ONLY): 40 min Past Medical History: Past Medical History: Diagnosis Date  Back pain 10/2008  per MRI 11/06/2008 - severe spinal stenosis worse at L4-5,  L3-4, with neural compression at those levels  BPH (benign prostatic hyperplasia)   Eczema   Gout   Hypertension   Hypothyroidism  Past Surgical History: Past Surgical History: Procedure Laterality Date  LAMINECTOMY  12/11/2008   Bilateral L2, L3, and L4 laminotomies, done by Dr. Arnoldo Morale HPI: Khyree Carillo is an 88 y.o. male who presents to the emergency department with dysphagia, shortness of breath, and cough. Dx DISH, pna, likely aspiration, AKI, hyponatremia.  Prior medical history significant for hypertension, hypothyroidism, and BPH. Patient has had worsening dysphagia to both solids and liquids over the past couple months. Has had frequent cough over the same interval, and has developed progressive shortness of breath and worsening productive cough over the past few days. Chest x-ray10/6 notable for patchy infiltrates in the bases and emphysematous changes. CT neck without contrast 10/6: "Extensive anterior bulky endplate osteophytic spurring throughout the visualized cervicothoracic spine, consistent  with DISH. 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 Fusion extends to T1." EGD 10/6 - normal esophagus  Subjective: alert  Recommendations for follow up therapy are one component of a multi-disciplinary discharge planning process, led by the attending physician.  Recommendations may be updated based on patient status, additional functional criteria and insurance authorization. Assessment / Plan / Recommendation   04/04/2022   9:03 AM Clinical Impressions Clinical Impression Pt presents with a severe pharyngeal dysphagia related to structural deficits of anterior vertebrae (large anterior syndesmophytes and/or osteophytes per radiology).  He did not demonstrate aspiration during today's study, likely due to limited quantities consumed.  There was good arytenoid-to-base of epiglottis contact, which helped with airway protection, but the epiglottis is unable to fully close over the larynx due to mechanical obstruction from cervical vertebrae.  There is poor patency of UES.  As a result, barium  (thin and puree) sat in valleculae and hypopharynx while pt swallowed multiple times in an effort to transfer material through pharynx/esophagus.  We attempted  a reclined position - this may have helped marginally to transition thin liquids.  His son was present for exam: we reviewed video, the chronic nature of this mechanical dysphagia, the difficulty his father has had meeting his nutritional needs. He is hoping to speak to Dr. Arnoldo Morale, neurosurgery, about candidacy for repair.  He asked questions about the necessity of a feeding tube. His son verbalized understanding of his father's advanced age and how this will impact decisions about management.  For now, continue to allow purees; there is no benefit in thickening liquids - allow thin liquids per pt's preferences.  D/W Dr. Sloan Leiter. SLP will follow for plan. SLP Visit  Diagnosis Dysphagia, pharyngeal phase (R13.13) Impact on safety and function Moderate aspiration risk     04/04/2022   9:03 AM Treatment Recommendations Treatment Recommendations Therapy as outlined in treatment plan below     04/04/2022   9:03 AM Prognosis Prognosis for Safe Diet Advancement Fair   04/04/2022   9:03 AM Diet Recommendations SLP Diet Recommendations Dysphagia 1 (Puree) solids;Thin liquid Liquid Administration via Cup;Straw Medication Administration Crushed with puree Compensations --     04/04/2022   9:03 AM Other Recommendations Oral Care Recommendations Oral care BID Follow Up Recommendations Other (comment)   04/04/2022   9:03 AM Frequency and Duration  Speech Therapy Frequency (ACUTE ONLY) min 2x/week Treatment Duration 2 weeks     04/04/2022   9:03 AM Oral Phase Oral Phase Impaired Oral -  Thin Straw WFL Oral - Puree [facility]    04/04/2022   9:03 AM Pharyngeal Phase Pharyngeal Phase Impaired Pharyngeal- Thin Straw Reduced epiglottic inversion;Pharyngeal residue - valleculae;Pharyngeal residue - pyriform Pharyngeal- Puree Reduced epiglottic inversion;Pharyngeal residue - valleculae;Pharyngeal residue - pyriform     No data to display    Blenda Mounts Laurice 04/04/2022, 9:36 AM                     MR CERVICAL SPINE WO CONTRAST  Result Date: 04/03/2022 CLINICAL DATA:  Chronic neck pain.  DISH. EXAM: MRI CERVICAL SPINE WITHOUT CONTRAST TECHNIQUE: Multiplanar, multisequence MR imaging of the cervical spine was performed. No intravenous contrast was administered. COMPARISON:  None Available. FINDINGS: Alignment: Slight degenerative retrolisthesis is present at C3-4. No other significant listhesis is present. Cervical lordosis is preserved. 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 Fusion extends to T1. Marrow signal and vertebral body heights are otherwise normal. Cord: Normal signal and morphology. Posterior Fossa, vertebral arteries, paraspinal tissues: Prominent soft tissue  pannus is present about the dens without central canal stenosis. Craniocervical junction is otherwise normal. The visualized cranial contents are normal. Flow is present in the vertebral arteries bilaterally. Disc levels: C2-3: Asymmetric right-sided facet hypertrophy is present. No significant stenosis is present. C3-4: A shallow central disc protrusion effaces the ventral CSF. Uncovertebral spurring leads to moderate right and mild left foraminal stenosis. C4-5: A shallow central disc protrusion is present. Uncovertebral spurring leads to moderate foraminal narrowing, right greater than left. C5-6: A left paramedian disc protrusion contacts and distorts the ventral surface the cord without abnormal cord signal. Moderate foraminal narrowing is present bilaterally. C6-7: A small central disc protrusion effaces the ventral CSF. The foramina are patent bilaterally. C7-T1: Negative. IMPRESSION: 1. Multilevel spondylosis of the cervical spine as described. 2. Moderate right and mild left foraminal stenosis at C3-4. 3. Moderate foraminal narrowing bilaterally at C4-5 and C5-6. 4. A left paramedian disc protrusion at C5-6 contacts and distorts the ventral surface the cord without abnormal cord signal. 5. Small central disc protrusion at C6-7 without significant stenosis. Electronically Signed   By: Marin Roberts M.D.   On: 04/03/2022 14:24   CT Soft Tissue Neck Wo Contrast  Result Date: 04/01/2022 CLINICAL DATA:  Initial evaluation for hoarseness, dysphagia. EXAM: CT NECK WITHOUT CONTRAST TECHNIQUE: Multidetector CT imaging of the neck was performed following the standard protocol without intravenous contrast. RADIATION DOSE REDUCTION: This exam was performed according to the departmental dose-optimization program which includes automated exposure control, adjustment of the mA and/or kV according to patient size and/or use of iterative reconstruction technique. COMPARISON:  None Available. FINDINGS: Pharynx and  larynx: Oral cavity within normal limits. No visible acute inflammatory changes about the teeth. Oropharynx and nasopharynx within normal limits. No retropharyngeal collection or swelling. Negative epiglottis. Vallecula clear. Hypopharynx and supraglottic larynx within normal limits. Glottis symmetric without visible abnormality. Subglottic airway patent clear. Visualized upper esophagus within normal limits. Salivary glands: Salivary glands including the parotid and submandibular glands are grossly within normal limits. Thyroid: Thyroid within normal limits. Lymph nodes: No visible enlarged or pathologic adenopathy on this noncontrast examination. Vascular: Scattered atheromatous change about the aortic arch, carotid bifurcations, and skull base. Limited intracranial: Unremarkable. Visualized orbits: Prior bilateral ocular lens replacement. Remote posttraumatic defect noted at the left lamina papyracea. Otherwise unremarkable. Mastoids and visualized paranasal sinuses: Mild chronic mucoperiosteal thickening noted about the ethmoidal air cells and maxillary sinuses. Trace right mastoid effusion  noted, of doubtful significance. Middle ear cavities are well pneumatized and free of fluid. 1.4 cm sclerotic lesion at the anterior aspect of the left maxillary sinus (series 3, image 15), indeterminate, but likely benign. Skeleton: No other discrete or worrisome osseous lesions. Extensive anterior bulky endplate osteophytic spurring seen throughout the visualized cervicothoracic spine, suggesting dish. Mild chronic compression deformities noted at the superior endplates of T3 and T4. Osteoarthritic changes noted about the partially visualized shoulders. Upper chest: Mild patchy ground-glass opacity noted within the visualized left upper and lower lobes, likely mild infection/pneumonitis. Visualized upper chest demonstrates no other acute finding. Other: None. IMPRESSION: 1. Mild patchy ground-glass opacity within the  visualized left upper and lower lobes, likely mild infection/pneumonitis. 2. Extensive anterior bulky endplate osteophytic spurring throughout the visualized cervicothoracic spine, consistent with DISH. These changes can sometimes be associated with dysphagia. 3. No other acute abnormality within the neck. Aortic Atherosclerosis (ICD10-I70.0). Electronically Signed   By: Rise Mu M.D.   On: 04/01/2022 23:24   CT Head Wo Contrast  Result Date: 04/01/2022 CLINICAL DATA:  Dysphagia for 2 months.  Shortness of breath. EXAM: CT HEAD WITHOUT CONTRAST TECHNIQUE: Contiguous axial images were obtained from the base of the skull through the vertex without intravenous contrast. RADIATION DOSE REDUCTION: This exam was performed according to the departmental dose-optimization program which includes automated exposure control, adjustment of the mA and/or kV according to patient size and/or use of iterative reconstruction technique. COMPARISON:  12/03/2021 FINDINGS: Brain: Diffuse cerebral atrophy. Ventricular dilatation consistent with central atrophy. Cavum septum pellucidum. Low-attenuation changes in the deep white matter consistent with small vessel ischemia. No abnormal extra-axial fluid collections. No mass effect or midline shift. Gray-white matter junctions are distinct. Basal cisterns are not effaced. No acute intracranial hemorrhage. Vascular: Intracranial arterial vascular calcifications. Skull: Normal. Negative for fracture or focal lesion. Sinuses/Orbits: No acute finding. Other: None. IMPRESSION: No acute intracranial abnormalities. Chronic atrophy and small vessel ischemic changes. Electronically Signed   By: Burman Nieves M.D.   On: 04/01/2022 23:07   DG Chest 2 View  Result Date: 04/01/2022 CLINICAL DATA:  Shortness of breath.  Dysphagia for 2 months. EXAM: CHEST - 2 VIEW COMPARISON:  03/06/2022 FINDINGS: Heart size and pulmonary vascularity are normal. Emphysematous changes in the lungs.  New infiltrates in the lung bases may indicate pneumonia or aspiration. No pleural effusions. No pneumothorax. Mediastinal contours appear intact. Severe degenerative changes in the right shoulder. Previous resection or resorption of the left humeral head with remodeling of bone and degenerative changes in the left shoulder. Degenerative changes also in the spine. IMPRESSION: 1. Patchy infiltrates in the lung bases may be due to pneumonia or aspiration. 2. Emphysematous changes in the lungs. 3. Severe degenerative changes in the visualized skeleton. Previous resection or resorption of the left humeral head. Electronically Signed   By: Burman Nieves M.D.   On: 04/01/2022 20:50     TODAY-DAY OF DISCHARGE:  Subjective:   Desean Heemstra today has no headache,no chest abdominal pain,no new weakness tingling or numbness, feels much better wants to go home today.  Objective:   Blood pressure (!) 147/62, pulse 75, temperature 97.6 F (36.4 C), temperature source Oral, resp. rate 15, height 6' (1.829 m), weight 61.7 kg, SpO2 100 %.  Intake/Output Summary (Last 24 hours) at 04/09/2022 1007 Last data filed at 04/08/2022 1527 Gross per 24 hour  Intake --  Output 600 ml  Net -600 ml   Filed Weights   04/05/22 0500 04/06/22  1153 04/08/22 0455  Weight: 59.5 kg 59.5 kg 61.7 kg    Exam: Awake Alert, Oriented *3, No new F.N deficits, Normal affect [place].AT,PERRAL Supple Neck,No JVD, No cervical lymphadenopathy appriciated.  Symmetrical Chest wall movement, Good air movement bilaterally, CTAB RRR,No Gallops,Rubs or new Murmurs, No Parasternal Heave +ve B.Sounds, Abd Soft, Non tender, No organomegaly appriciated, No rebound -guarding or rigidity. No Cyanosis, Clubbing or edema, No new Rash or bruise   PERTINENT RADIOLOGIC STUDIES: No results found.   PERTINENT LAB RESULTS: CBC: No results for input(s): "WBC", "HGB", "HCT", "PLT" in the last 72 hours. CMET CMP     Component Value Date/Time   NA  138 04/09/2022 0440   NA 133 (L) 03/18/2022 1334   K 4.6 04/09/2022 0440   CL 105 04/09/2022 0440   CO2 28 04/09/2022 0440   GLUCOSE 104 (H) 04/09/2022 0440   BUN 27 (H) 04/09/2022 0440   BUN 40 (H) 03/18/2022 1334   CREATININE 1.20 04/09/2022 0440   CALCIUM 9.6 04/09/2022 0440   PROT 7.1 03/31/2022 1636   PROT 7.7 03/10/2022 1143   ALBUMIN 3.1 (L) 03/31/2022 1636   ALBUMIN 4.0 03/10/2022 1143   AST 16 03/31/2022 1636   ALT 9 03/31/2022 1636   ALKPHOS 52 03/31/2022 1636   BILITOT 1.0 03/31/2022 1636   BILITOT 0.4 03/10/2022 1143   GFRNONAA 59 (L) 04/09/2022 0440   GFRAA 63 01/16/2017 1225    GFR Estimated Creatinine Clearance: 38.6 mL/min (by C-G formula based on SCr of 1.2 mg/dL). No results for input(s): "LIPASE", "AMYLASE" in the last 72 hours. No results for input(s): "CKTOTAL", "CKMB", "CKMBINDEX", "TROPONINI" in the last 72 hours. Invalid input(s): "POCBNP" No results for input(s): "DDIMER" in the last 72 hours. No results for input(s): "HGBA1C" in the last 72 hours. No results for input(s): "CHOL", "HDL", "LDLCALC", "TRIG", "CHOLHDL", "LDLDIRECT" in the last 72 hours. No results for input(s): "TSH", "T4TOTAL", "T3FREE", "THYROIDAB" in the last 72 hours.  Invalid input(s): "FREET3" No results for input(s): "VITAMINB12", "FOLATE", "FERRITIN", "TIBC", "IRON", "RETICCTPCT" in the last 72 hours. Coags: No results for input(s): "INR" in the last 72 hours.  Invalid input(s): "PT" Microbiology: Recent Results (from the past 240 hour(s))  Resp Panel by RT-PCR (Flu A&B, Covid) Anterior Nasal Swab     Status: None   Collection Time: 04/01/22  9:56 PM   Specimen: Anterior Nasal Swab  Result Value Ref Range Status   SARS Coronavirus 2 by RT PCR NEGATIVE NEGATIVE Final    Comment: (NOTE) SARS-CoV-2 target nucleic acids are NOT DETECTED.  The SARS-CoV-2 RNA is generally detectable in upper respiratory specimens during the acute phase of infection. The lowest concentration  of SARS-CoV-2 viral copies this assay can detect is 138 copies/mL. A negative result does not preclude SARS-Cov-2 infection and should not be used as the sole basis for treatment or other patient management decisions. A negative result may occur with  improper specimen collection/handling, submission of specimen other than nasopharyngeal swab, presence of viral mutation(s) within the areas targeted by this assay, and inadequate number of viral copies(<138 copies/mL). A negative result must be combined with clinical observations, patient history, and epidemiological information. The expected result is Negative.  Fact Sheet for Patients:  BloggerCourse.com  Fact Sheet for Healthcare Providers:  SeriousBroker.it  This test is no t yet approved or cleared by the [place] FDA and  has been authorized for detection and/or diagnosis of SARS-CoV-2 by FDA under an Emergency Use Authorization (EUA). This EUA  will remain  in effect (meaning this test can be used) for the duration of the COVID-19 declaration under Section 564(b)(1) of the Act, 21 U.S.C.section 360bbb-3(b)(1), unless the authorization is terminated  or revoked sooner.       Influenza A by PCR NEGATIVE NEGATIVE Final   Influenza B by PCR NEGATIVE NEGATIVE Final    Comment: (NOTE) The Xpert Xpress SARS-CoV-2/FLU/RSV plus assay is intended as an aid in the diagnosis of influenza from Nasopharyngeal swab specimens and should not be used as a sole basis for treatment. Nasal washings and aspirates are unacceptable for Xpert Xpress SARS-CoV-2/FLU/RSV testing.  Fact Sheet for Patients: BloggerCourse.com  Fact Sheet for Healthcare Providers: SeriousBroker.it  This test is not yet approved or cleared by the [place] FDA and has been authorized for detection and/or diagnosis of SARS-CoV-2 by FDA under an Emergency Use  Authorization (EUA). This EUA will remain in effect (meaning this test can be used) for the duration of the COVID-19 declaration under Section 564(b)(1) of the Act, 21 U.S.C. section 360bbb-3(b)(1), unless the authorization is terminated or revoked.  Performed at [facility], [address] [place], [place] [address]   Blood culture (routine x 2)     Status: None   Collection Time: 04/01/22 10:05 PM   Specimen: BLOOD  Result Value Ref Range Status   Specimen Description   Final    BLOOD Performed at [facility], [address] [place], [place] [address]    Special Requests   Final    NONE Performed at [facility], [address], [place], [place] [address]    Culture   Final    NO GROWTH 5 DAYS Performed at [facility], [address], [place], [place] [address]    Report Status 04/07/2022 FINAL  Final  Blood culture (routine x 2)     Status: None   Collection Time: 04/01/22 10:10 PM   Specimen: BLOOD  Result Value Ref Range Status   Specimen Description   Final    BLOOD Performed at [facility], [address] [place], [place] [address]    Special Requests   Final    NONE Performed at [facility], [address] [place], [place] [address]    Culture   Final    NO GROWTH 5 DAYS Performed at [facility], [address] [place], [place] [address]    Report Status 04/07/2022 FINAL  Final  Expectorated Sputum Assessment w Gram Stain, Rflx to Resp Cult     Status: None   Collection Time: 04/02/22 10:00 AM   Specimen: Sputum  Result Value Ref Range Status   Specimen Description SPU  Final   Special Requests EXPECTORATED  Final   Sputum evaluation   Final    THIS SPECIMEN IS ACCEPTABLE FOR SPUTUM CULTURE Performed at [facility], [address], [place], [place] [address]    Report Status 04/02/2022 FINAL  Final  Culture, Respiratory w Gram  Stain     Status: None   Collection Time: 04/02/22 10:00 AM   Specimen: Sputum  Result Value Ref Range Status   Specimen Description SPU  Final   Special Requests EXPECTORATED Reflexed from [id number]  Final   Gram Stain   Final    RARE WBC PRESENT, PREDOMINANTLY PMN FEW GRAM NEGATIVE RODS RARE GRAM POSITIVE COCCI    Culture   Final    FEW PSEUDOMONAS AERUGINOSA WITHIN MIXED ORGANISMS Performed at  [facility], [address], [place], [place] [address]    Report Status 04/06/2022 FINAL  Final   Organism ID, Bacteria PSEUDOMONAS AERUGINOSA  Final      Susceptibility   Pseudomonas aeruginosa - MIC*    CEFTAZIDIME 4 SENSITIVE Sensitive     CIPROFLOXACIN <=0.25 SENSITIVE Sensitive     GENTAMICIN 2 SENSITIVE Sensitive     IMIPENEM 2 SENSITIVE Sensitive     PIP/TAZO 8 SENSITIVE Sensitive     CEFEPIME 2 SENSITIVE Sensitive     * FEW PSEUDOMONAS AERUGINOSA    FURTHER DISCHARGE INSTRUCTIONS:  Get Medicines reviewed and adjusted: Please take all your medications with you for your next visit with your Primary MD  Laboratory/radiological data: Please request your Primary MD to go over all hospital tests and procedure/radiological results at the follow up, please ask your Primary MD to get all Hospital records sent to his/her office.  In some cases, they will be blood work, cultures and biopsy results pending at the time of your discharge. Please request that your primary care M.D. goes through all the records of your hospital data and follows up on these results.  Also Note the following: If you experience worsening of your admission symptoms, develop shortness of breath, life threatening emergency, suicidal or homicidal thoughts you must seek medical attention immediately by calling 911 or calling your MD immediately  if symptoms less severe.  You must read complete instructions/literature along with all the possible adverse reactions/side effects for all the Medicines you take and  that have been prescribed to you. Take any new Medicines after you have completely understood and accpet all the possible adverse reactions/side effects.   Do not drive when taking Pain medications or sleeping medications (Benzodaizepines)  Do not take more than prescribed Pain, Sleep and Anxiety Medications. It is not advisable to combine anxiety,sleep and pain medications without talking with your primary care practitioner  Special Instructions: If you have smoked or chewed Tobacco  in the last 2 yrs please stop smoking, stop any regular Alcohol  and or any Recreational drug use.  Wear Seat belts while driving.  Please note: You were cared for by a hospitalist during your hospital stay. Once you are discharged, your primary care physician will handle any further medical issues. Please note that NO REFILLS for any discharge medications will be authorized once you are discharged, as it is imperative that you return to your primary care physician (or establish a relationship with a primary care physician if you do not have one) for your post hospital discharge needs so that they can reassess your need for medications and monitor your lab values.  Total Time spent coordinating discharge including counseling, education and face to face time equals greater than 30 minutes.  SignedJeoffrey Massed 04/09/2022 10:07 AM

## 2022-04-08 NOTE — Telephone Encounter (Signed)
Requested Prescriptions  Pending Prescriptions Disp Refills  . furosemide (LASIX) 20 MG tablet 90 tablet 1    Sig: Take 1 tablet (20 mg total) by mouth once daily.     Cardiovascular:  Diuretics - Loop Failed - 04/08/2022  2:18 PM      Failed - Cr in normal range and within 180 days    Creatinine, Ser  Date Value Ref Range Status  04/08/2022 1.46 (H) 0.61 - 1.24 mg/dL Final   Creatinine, Urine  Date Value Ref Range Status  04/02/2022 81 mg/dL Final    Comment:    Performed at [facility], [place] [address] [place], [place] [address]         Failed - Last BP in normal range    BP Readings from Last 1 Encounters:  04/08/22 (!) 105/44         Passed - K in normal range and within 180 days    Potassium  Date Value Ref Range Status  04/08/2022 4.3 3.5 - 5.1 mmol/L Final         Passed - Ca in normal range and within 180 days    Calcium  Date Value Ref Range Status  04/08/2022 9.5 8.9 - 10.3 mg/dL Final   Calcium, Ion  Date Value Ref Range Status  12/04/2021 1.30 1.15 - 1.40 mmol/L Final         Passed - Na in normal range and within 180 days    Sodium  Date Value Ref Range Status  04/08/2022 139 135 - 145 mmol/L Final  03/18/2022 133 (L) 134 - 144 mmol/L Final         Passed - Cl in normal range and within 180 days    Chloride  Date Value Ref Range Status  04/08/2022 104 98 - 111 mmol/L Final         Passed - Mg Level in normal range and within 180 days    Magnesium  Date Value Ref Range Status  04/08/2022 2.1 1.7 - 2.4 mg/dL Final    Comment:    Performed at [facility], [place] [address], [place],  [address]         Passed - Valid encounter within last 6 months    Recent Outpatient Visits          4 weeks ago Loss of appetite   [place], Charlane Ferretti, MD   3 months ago Adhesive capsulitis of both shoulders   [place], MD   5 years ago Essential  hypertension   [place], MD   5 years ago Acute kidney injury [facility])   [place], [place], [place]

## 2022-04-08 NOTE — TOC Transition Note (Addendum)
Transition of Care [name]) - CM/SW Discharge Note   Patient Details  Name: [name] [name] MRN: [id number] Date of Birth: 03-Aug-1933  [facility] CM/SW Contact:  Verdell Carmine, RN Phone Number: 04/08/2022, 9:10 AM   Clinical Narrative:     Spoke with son at bedside. He is concerned as he cannot pay for feeding. The patient has a note from [facility] about hospitalization. Called Pam from [place] to see if there are any programs. There is none that they have. Called Zack blank from [employer]. He will set the patient up through charity.  Received call from MD stating patient is DC today. Son states he does not need any help at home, but wants to make sure that the patient has everything needed and is tolerating everything well.  2094 D/W Provider and RN. Patient will go home on bolus feeds, informed zack at [employer]. 32 Son called and spoke to him regarding coming in this afternoon for teaching about feeding. He will bolus feed at home, he will be in the afternoon for teaching. Shanda Howells will be delivered to home via [employer]. We will need to give 3-5 days worth of supplies and formula. ( Jevity) for DC.  1600 working on [employer] with pharmacy three days worth would be 5 bottles. Zack from [employer] stated that should be enough. They will bring up to room 1630 Enteral form for signature with Dr. Sloan Leiter , placed at his station in dictating room on 5 W per request. He will sign tomorrow AM, then Cm will fax / email back to [employer].     Barriers to Discharge:  (feeding family cannot pay for)   Patient Goals and CMS Choice Patient states their goals for this hospitalization and ongoing recovery are:: return home CMS Medicare.gov Compare Post Acute Care list provided to:: Patient Represenative (must comment) Choice offered to / list presented to : Adult Children  Discharge Placement               Home. BSC and walker, feeding provided by [employer].        Discharge Plan and Services   Discharge  Planning Services: CM Consult, [place] Program            DME Arranged: Tube feeding DME Agency: [employer] Date DME Agency Contacted: 04/08/22 Time DME Agency Contacted: [phone] Representative spoke with at DME Agency: [place] ([place]) Interventions     Readmission Risk Interventions     No data to display

## 2022-04-08 NOTE — Progress Notes (Signed)
Family provided education on bolus feedings and medication demonstration via PEG tube. Family able to verbalize and show RN correct method.

## 2022-04-08 NOTE — Progress Notes (Signed)
Orthostatic with physical therapy-apparently dizzy as well. Hold discharge  Stop Cardura Hydrate overnight Place TED stockings Reassess 10/14 for discharge.

## 2022-04-09 LAB — BASIC METABOLIC PANEL
Anion gap: 5 (ref 5–15)
BUN: 27 mg/dL — ABNORMAL HIGH (ref 8–23)
CO2: 28 mmol/L (ref 22–32)
Calcium: 9.6 mg/dL (ref 8.9–10.3)
Chloride: 105 mmol/L (ref 98–111)
Creatinine, Ser: 1.2 mg/dL (ref 0.61–1.24)
GFR, Estimated: 59 mL/min — ABNORMAL LOW (ref >60)
Glucose, Bld: 104 mg/dL — ABNORMAL HIGH (ref 70–99)
Potassium: 4.6 mmol/L (ref 3.5–5.1)
Sodium: 138 mmol/L (ref 135–145)

## 2022-04-09 LAB — GLUCOSE, CAPILLARY
Glucose-Capillary: 117 mg/dL — ABNORMAL HIGH (ref 70–99)
Glucose-Capillary: 111 mg/dL — ABNORMAL HIGH (ref 70–99)

## 2022-04-09 MED ORDER — BISOPROLOL FUMARATE 5 MG PO TABS
5.0000 mg | ORAL_TABLET | Freq: Every day | ORAL | 6 refills | Status: DC
  Filled 2022-04-09 – 2022-05-09 (×2): qty 30, 30d supply, fill #0
  Filled 2022-06-08: qty 30, 30d supply, fill #1
  Filled 2023-03-13: qty 30, 30d supply, fill #2

## 2022-04-09 NOTE — Progress Notes (Signed)
Son [place] at bedside requesting to speak with case management in regards to [place] costs once DC, he requested to speak face to face w/ case management. RN messaged Carles Collet, RN of case management with son's concerns. Jackelyn Poling states she tried to reach him by phone, by was unsuccessful. Her contact number [phone] was provided and given to son at this time.

## 2022-04-09 NOTE — Progress Notes (Addendum)
Seen and examined No major issues overnight Although slightly orthostatic-asymptomatic this morning This seems to be a chronic issue-patient acknowledges intermittent dizziness when standing up even at home Stable for discharge See discharge summary for details

## 2022-04-09 NOTE — TOC Transition Note (Signed)
Transition of Care [facility]) - CM/SW Discharge Note   Patient Details  Name: [name] [name] MRN: [id number] Date of Birth: Jan 12, 1934  Transition of Care [facility]) CM/SW Contact:  Carles Collet, RN Phone Number: 04/09/2022, 8:54 AM   Clinical Narrative:     DC meds filled through [place] yesterday per Dr Sloan Leiter. HH- per prior TOC notes, [facility] agency could not accept. DME- per prior notes RW and 3/1 ordered for patient.  Tube feeds for home- per prior notes, pharmacy to bring 5 bottles of Jevity to the room for patient to take home.  Tube feed orders signed this morning and scanned to [place] who has confirmed receipt of order. Nursing staff to ensure son is taught at bedside how to administer bolus feeds, and send home with supplies from bedside and 5 bottles of Jevity.   Final next level of care: Home/Self Care Barriers to Discharge: No Barriers Identified   Patient Goals and CMS Choice Patient states their goals for this hospitalization and ongoing recovery are:: return home CMS Medicare.gov Compare Post Acute Care list provided to:: Patient Represenative (must comment) Choice offered to / list presented to : Adult Children  Discharge Placement                       Discharge Plan and Services   Discharge Planning Services: CM Consult, [place] Program            DME Arranged: Tube feeding DME Agency: [employer] Date DME Agency Contacted: 04/08/22 Time DME Agency Contacted: [phone] Representative spoke with at DME Agency: [place] ([place]) Interventions     Readmission Risk Interventions     No data to display

## 2022-04-09 NOTE — Plan of Care (Signed)
  Problem: Education: Goal: Knowledge of General Education information will improve Description: Including pain rating scale, medication(s)/side effects and non-pharmacologic comfort measures Outcome: Progressing   Problem: Activity: Goal: Risk for activity intolerance will decrease Outcome: Progressing   Problem: Nutrition: Goal: Adequate nutrition will be maintained Outcome: Progressing   Problem: Elimination: Goal: Will not experience complications related to bowel motility Outcome: Progressing   Problem: Activity: Goal: Ability to tolerate increased activity will improve Outcome: Progressing   Problem: Respiratory: Goal: Ability to maintain adequate ventilation will improve Outcome: Progressing

## 2022-04-11 ENCOUNTER — Telehealth: Payer: Self-pay

## 2022-04-11 ENCOUNTER — Other Ambulatory Visit: Payer: Self-pay

## 2022-04-11 NOTE — Telephone Encounter (Signed)
Transition Care Management Follow-up Telephone Call  I called with assistance of Arabic interpreter [id number]/[employer]. His son, Godfrey Pick answered and said that he did not need an interpreter, so I called him back.  Date of discharge and from where: 04/09/2022, [name]  How have you been since you were released from the hospital? Godfrey Pick said that his father is not doing well. Since, the feeding tube placement, he just wants to lay down.  Godfrey Pick said he administers the tube feedings or his father  6 times/ day as instructed and he has no problems with administering the feedings.  However, his father has been incontinent of bowels and bladder after the feedings.  [name] cleans him but said this had not happened before  he said he is not able to afford adult diapers. I gave him the address and phone number for [employer] [facility] and instructed him to call  and inquire if they have incontinence products. I explained to him that they usually have donated products that are free to seniors.   [name] then said that he gives his father and mother a place to live- they stay with he and his wife. He also as 7 children and said he is not able to afford the feeding formula.  I suggested that he contact [employer] [facility] and inquire about a financial assistance program that may be able to help with the cost.I provided him with the phone number or [employer]. I also sent a message to [employer] [facility] regarding information about financial assistance.  Any questions or concerns? Yes- noted above  Items Reviewed: Did the pt receive and understand the discharge instructions provided? Yes  Medications obtained and verified? Yes - he said his father has all of his medications and Khalad did not have any questions about the med regime  Other? No  Any new allergies since your discharge? No  Dietary orders reviewed? Yes- Godfrey Pick said his father is not taking anything by mouth.The hospital gave  him a pill crusher and he crushes his father's meds and administers them via PEG.  Do you have support at home? Yes   Home Care and Equipment/Supplies: Were home health services ordered? no If so, what is the name of the agency? N/a  Has the agency set up a time to come to the patient's home? not applicable Were any new equipment or medical supplies ordered?  Yes: RW, 3:1 commode, tube feeding formula/ supplies.  What is the name of the medical supply agency? [employer] [facility] , he received the DME and supplies from the hospital  Were you able to get the supplies/equipment? yes Do you have any questions related to the use of the equipment or supplies? No  Functional Questionnaire: (I = Independent and D = Dependent) ADLs: dependent - Godfrey Pick and his wife provide 24/7 care for him. Godfrey Pick said his wife quit her job in order to care for his father while he works. He is only able to ambulate with assistance.  He has been referred to outpatient PT,OT,ST.   Follow up appointments reviewed:  PCP Hospital f/u appt confirmed? Yes  Scheduled to see Gwinda Passe, NP - 04/25/2022.  Dr Laural Benes did not have anything available  until the end of November and Godfrey Pick wanted his father to be seen. I offered to schedule him at [facility] but he said [facility] was fine.  Specialist Hospital f/u appt confirmed?  None scheduled at this time    Are transportation arrangements needed? No  If their condition worsens, is the pt aware to call PCP or go to the Emergency Dept.? Yes Was the patient provided with contact information for the PCP's office or ED? Yes Was to pt encouraged to call back with questions or concerns? Yes

## 2022-04-16 ENCOUNTER — Emergency Department (HOSPITAL_BASED_OUTPATIENT_CLINIC_OR_DEPARTMENT_OTHER)
Admission: EM | Admit: 2022-04-16 | Discharge: 2022-04-17 | Disposition: A | Discharge status: Home / Self Care | Payer: Self-pay | Attending: Emergency Medicine | Admitting: Emergency Medicine

## 2022-04-16 ENCOUNTER — Other Ambulatory Visit: Payer: Self-pay

## 2022-04-16 ENCOUNTER — Encounter (HOSPITAL_BASED_OUTPATIENT_CLINIC_OR_DEPARTMENT_OTHER): Payer: Self-pay | Admitting: Emergency Medicine

## 2022-04-16 DIAGNOSIS — Z4659 Encounter for fitting and adjustment of other gastrointestinal appliance and device: Secondary | ICD-10-CM

## 2022-04-16 DIAGNOSIS — Z4889 Encounter for other specified surgical aftercare: Secondary | ICD-10-CM

## 2022-04-16 DIAGNOSIS — Z79899 Other long term (current) drug therapy: Secondary | ICD-10-CM | POA: Insufficient documentation

## 2022-04-16 DIAGNOSIS — I10 Essential (primary) hypertension: Secondary | ICD-10-CM | POA: Insufficient documentation

## 2022-04-16 DIAGNOSIS — Z431 Encounter for attention to gastrostomy: Secondary | ICD-10-CM | POA: Insufficient documentation

## 2022-04-16 DIAGNOSIS — Z4800 Encounter for change or removal of nonsurgical wound dressing: Secondary | ICD-10-CM | POA: Insufficient documentation

## 2022-04-16 DIAGNOSIS — Z7982 Long term (current) use of aspirin: Secondary | ICD-10-CM | POA: Insufficient documentation

## 2022-04-16 DIAGNOSIS — E039 Hypothyroidism, unspecified: Secondary | ICD-10-CM | POA: Insufficient documentation

## 2022-04-16 DIAGNOSIS — R1310 Dysphagia, unspecified: Secondary | ICD-10-CM | POA: Insufficient documentation

## 2022-04-16 MED ORDER — LIDOCAINE-EPINEPHRINE (PF) 2 %-1:200000 IJ SOLN
5.0000 mL | Freq: Once | INTRAMUSCULAR | Status: AC
  Administered 2022-04-16: 5 mL via INTRADERMAL
  Filled 2022-04-16: qty 20

## 2022-04-16 NOTE — ED Provider Notes (Signed)
  [facility] EMERGENCY DEPT Provider Note   CSN: [id number] Arrival date & time: 04/16/22  2029     History {Add pertinent medical, surgical, social history, OB history to HPI:1} No chief complaint on file.   [name] [name] is a 88 y.o. male.  HPI     Home Medications Prior to Admission medications   Medication Sig Start Date End Date Taking? Authorizing Provider  allopurinol (ZYLOPRIM) 100 MG tablet Take 1 tablet (100 mg total) by mouth once daily. 01/07/22   Ladell Pier, MD  aspirin EC 81 MG tablet Take 1 tablet (81 mg total) by mouth daily. Swallow whole. Patient not taking: Reported on 12/21/2021 12/07/21   Mayers, Cari S, PA-C  bisoprolol (ZEBETA) 5 MG tablet Take 1 tablet (5 mg total) by mouth once daily. 04/09/22   Ladell Pier, MD  cetirizine (ZYRTEC) 10 MG tablet Take 1 tablet (10 mg total) by mouth daily. 03/10/22   Charlott Rakes, MD  fluticasone (FLONASE) 50 MCG/ACT nasal spray Place 2 sprays into both nostrils daily. Patient taking differently: Place 2 sprays into both nostrils daily as needed for allergies. 03/10/22   Charlott Rakes, MD  Iron, Ferrous Sulfate, 325 (65 Fe) MG TABS Take 1 tablet (325 mg) by mouth every other day. Patient taking differently: Take 325 mg by mouth daily. 12/09/21   Mayers, Cari S, PA-C  levothyroxine (SYNTHROID) 50 MCG tablet Take 1 tablet (50 mcg total) by mouth once daily before breakfast. 01/07/22   Ladell Pier, MD  Nutritional Supplements (JEVITY 1 CAL/FIBER) LIQD 1 Can by PEG Tube route 6 (six) times daily. 04/08/22 04/08/23  Ghimire, Henreitta Leber, MD  triamcinolone cream (KENALOG) 0.1 % Apply 1 application topically to the affected area(s) 2 (two) times daily. Patient not taking: Reported on 04/03/2022 12/07/21   Mayers, Loraine Grip, PA-C  Water For Irrigation, Sterile (FREE WATER) SOLN 50 mL free water flush via PEG tube before and after each bolus 04/08/22   Ghimire, Henreitta Leber, MD      Allergies    Patient has no  known allergies.    Review of Systems   Review of Systems  Physical Exam Updated Vital Signs BP 139/64   Pulse 80   Temp 98.4 F (36.9 C) (Oral)   Resp 18   SpO2 100%  Physical Exam  ED Results / Procedures / Treatments   Labs (all labs ordered are listed, but only abnormal results are displayed) Labs Reviewed - No data to display  EKG None  Radiology No results found.  Procedures Procedures  {Document cardiac monitor, telemetry assessment procedure when appropriate:1}  Medications Ordered in ED Medications - No data to display  ED Course/ Medical Decision Making/ A&P                           Medical Decision Making  ***  {Document critical care time when appropriate:1} {Document review of labs and clinical decision tools ie heart score, Chads2Vasc2 etc:1}  {Document your independent review of radiology images, and any outside records:1} {Document your discussion with family members, caretakers, and with consultants:1} {Document social determinants of health affecting pt's care:1} {Document your decision making why or why not admission, treatments were needed:1} Final Clinical Impression(s) / ED Diagnoses Final diagnoses:  None    Rx / DC Orders ED Discharge Orders     None

## 2022-04-16 NOTE — ED Notes (Signed)
Feeding tube flushes well, no problem when flushing tube.

## 2022-04-16 NOTE — Discharge Instructions (Signed)
It appears that your feeding tube had one of the 3 sutures holding in place pulled out.  Fortunately it does not appear that there was any problem in the tube did not leave the body.  We assessed the tube function and it worked well according to nursing.  Please continue to use it as directed.  To maintain security we did place another suture and it was well-appearing.  Please keep the area clean and follow-up with PCP.

## 2022-04-16 NOTE — ED Triage Notes (Signed)
Patient has feeding tube,  Held in place by 3 sutures, son noticed that 1 stitch was loose this morning, wanted to make sure it was still in the correct location.

## 2022-04-18 ENCOUNTER — Other Ambulatory Visit: Payer: Self-pay

## 2022-04-20 NOTE — Telephone Encounter (Addendum)
I called patient's son, [name] [name], to inform him that I just received the following information from  [employer]:  04/12/2022- The [place] formula is scheduled to arrive today. As far as the financial assistance, We can arrange a payment plan or he can apply for hardship, however the hardship is typically only approved if a patient has insurance.   Before I could share that information with him, he said everything is great!  His father is doing great.  He received the formula and the representative from [place] told him to call a week before the formula is scheduled to run out and they will send more. He said she did not mention anything about the need to complete an application for financial assistance.  He also stated that he went to [employer] and obtained incontinence products.   He was extremely thankful for the assistance provided.

## 2022-04-25 ENCOUNTER — Encounter (INDEPENDENT_AMBULATORY_CARE_PROVIDER_SITE_OTHER): Payer: Self-pay | Admitting: Primary Care

## 2022-04-25 NOTE — Progress Notes (Signed)
Use [employer] interpreter for [place] [phone] number on file [phone]  incorrect

## 2022-05-06 ENCOUNTER — Other Ambulatory Visit: Payer: Self-pay

## 2022-05-09 ENCOUNTER — Other Ambulatory Visit: Payer: Self-pay | Admitting: Family Medicine

## 2022-05-09 ENCOUNTER — Other Ambulatory Visit: Payer: Self-pay

## 2022-05-09 DIAGNOSIS — R052 Subacute cough: Secondary | ICD-10-CM

## 2022-05-09 MED ORDER — CETIRIZINE HCL 10 MG PO TABS
10.0000 mg | ORAL_TABLET | Freq: Every day | ORAL | 2 refills | Status: DC
  Filled 2022-05-09: qty 30, 30d supply, fill #0
  Filled 2022-06-08: qty 30, 30d supply, fill #1
  Filled 2022-08-22 (×2): qty 30, 30d supply, fill #2

## 2022-05-11 ENCOUNTER — Other Ambulatory Visit: Payer: Self-pay

## 2022-06-08 ENCOUNTER — Other Ambulatory Visit: Payer: Self-pay

## 2022-06-09 ENCOUNTER — Other Ambulatory Visit: Payer: Self-pay

## 2022-08-21 ENCOUNTER — Ambulatory Visit (HOSPITAL_COMMUNITY)
Admission: EM | Admit: 2022-08-21 | Discharge: 2022-08-21 | Disposition: A | Discharge status: Home / Self Care | Payer: Self-pay | Attending: Physician Assistant | Admitting: Physician Assistant

## 2022-08-21 ENCOUNTER — Other Ambulatory Visit: Payer: Self-pay

## 2022-08-21 DIAGNOSIS — I872 Venous insufficiency (chronic) (peripheral): Secondary | ICD-10-CM

## 2022-08-21 DIAGNOSIS — Q809 Congenital ichthyosis, unspecified: Secondary | ICD-10-CM

## 2022-08-21 MED ORDER — EUCERIN INTENSIVE REPAIR EX OINT
1.0000 [IU] | TOPICAL_OINTMENT | Freq: Two times a day (BID) | CUTANEOUS | 2 refills | Status: DC
  Filled 2022-08-21: qty 360, fill #0

## 2022-08-21 NOTE — ED Triage Notes (Signed)
Dry, open wound to left anterior ankle.   Patient has had wounds before .  Patient has seen specialist.    Patient has been sent to a specialist and "received socks" and improved. Family member requested cream for this

## 2022-08-21 NOTE — ED Provider Notes (Signed)
[facility]    CSN: [id number] Arrival date & time: 08/21/22  1737      History   Chief Complaint Chief Complaint  Patient presents with   Rash    HPI [name] [name] is a 89 y.o. male.   89 year old male presents with left ankle stasis dermatitis with crusting eschar, dry skin.  History is being taken through son being the interpreter with Arabic being the language.  Son indicates that for the past week the patient has had a crusted stasis dermatitis area on the left anterior ankle, mild weeping, mild pain.  Son indicates there is been no swelling to the ankle or redness.  Send indicates he has had a similar episode before to where he had to go to the wound center and it was treated, healed, he was given some compression stockings that he wore at that time that helped with circulation.  Patient does not have any compression stockings on this evening.  This indicates he does have a PCP that he sees on a regular basis and he will make an appointment for the patient to be seen within the next week.  Son also indicates that the patient has dry skin with peeling mainly arms, legs.  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  Patient has not used any OTC medications or creams on a regular basis to help decrease dryness.   Rash   Past Medical History:  Diagnosis Date   Back pain 10/2008   per MRI 11/06/2008 - severe spinal stenosis worse at L4-5, L3-4, with neural compression at those levels   BPH (benign prostatic hyperplasia)    Eczema    Gout    Hypertension    Hypothyroidism     Patient Active Problem List   Diagnosis Date Noted   Protein-calorie malnutrition, severe 04/08/2022   Dysphagia 04/02/2022   Aspiration pneumonia ([place]) 04/02/2022   Hyponatremia 04/02/2022   Elevated troponin 04/02/2022   Acute renal failure superimposed on stage 3a chronic kidney disease ([place]) 04/02/2022   Edema 12/07/2021   Venous insufficiency of both lower extremities  12/07/2021   Eczema 12/07/2021   Hx of gout 01/24/2017   Hypothyroidism 10/30/2008   Anemia, chronic disease 09/18/2008   Essential hypertension 09/18/2008   BENIGN PROSTATIC HYPERTROPHY, WITH URINARY OBSTRUCTION 09/18/2008   BACK PAIN, CHRONIC 09/18/2008    Past Surgical History:  Procedure Laterality Date   ESOPHAGOGASTRODUODENOSCOPY N/A 04/03/2022   Procedure: ESOPHAGOGASTRODUODENOSCOPY (EGD);  Surgeon: Carol Ada, MD;  Location: [facility];  Service: Gastroenterology;  Laterality: N/A;   LAMINECTOMY  12/11/2008    Bilateral L2, L3, and L4 laminotomies, done by Dr. Arnoldo Morale   LAPAROSCOPY N/A 04/06/2022   Procedure: LAPAROSCOPIC ASSISTED PERCUTANEOUS ENDOSCOPIC GASTROSTOMY PLACEMENT;  Surgeon: Donnie Mesa, MD;  Location: [facility];  Service: General;  Laterality: N/A;       Home Medications    Prior to Admission medications   Medication Sig Start Date End Date Taking? Authorizing Provider  Emollient (EUCERIN INTENSIVE REPAIR) OINT Apply 1 Units topically 2 (two) times daily. To dry skin twice daily. 08/21/22  Yes Nyoka Lint, PA-C  allopurinol (ZYLOPRIM) 100 MG tablet Take 1 tablet (100 mg total) by mouth once daily. 01/07/22   Ladell Pier, MD  aspirin EC 81 MG tablet Take 1 tablet (81 mg total) by mouth daily. Swallow whole. Patient not taking: Reported on 12/21/2021 12/07/21   Mayers, Cari S, PA-C  bisoprolol (ZEBETA) 5 MG tablet Take 1 tablet (5  mg total) by mouth once daily. 04/09/22   Ladell Pier, MD  cetirizine (ZYRTEC) 10 MG tablet Take 1 tablet (10 mg total) by mouth daily. 05/09/22   Ladell Pier, MD  fluticasone (FLONASE) 50 MCG/ACT nasal spray Place 2 sprays into both nostrils daily. Patient taking differently: Place 2 sprays into both nostrils daily as needed for allergies. 03/10/22   Charlott Rakes, MD  Iron, Ferrous Sulfate, 325 (65 Fe) MG TABS Take 1 tablet (325 mg) by mouth every other day. Patient taking differently: Take 325 mg by mouth  daily. 12/09/21   Mayers, Cari S, PA-C  levothyroxine (SYNTHROID) 50 MCG tablet Take 1 tablet (50 mcg total) by mouth once daily before breakfast. 01/07/22   Ladell Pier, MD  Nutritional Supplements (JEVITY 1 CAL/FIBER) LIQD 1 Can by PEG Tube route 6 (six) times daily. 04/08/22 04/08/23  Ghimire, Henreitta Leber, MD  triamcinolone cream (KENALOG) 0.1 % Apply 1 application topically to the affected area(s) 2 (two) times daily. Patient not taking: Reported on 04/03/2022 12/07/21   Mayers, Loraine Grip, PA-C  Water For Irrigation, Sterile (FREE WATER) SOLN 50 mL free water flush via PEG tube before and after each bolus 04/08/22   Ghimire, Henreitta Leber, MD    Family History Family History  Problem Relation Age of Onset   Stroke Neg Hx    Cancer Neg Hx     Social History Social History   Tobacco Use   Smoking status: Never   Smokeless tobacco: Never  Vaping Use   Vaping Use: Never used  Substance Use Topics   Alcohol use: No   Drug use: No     Allergies   Patient has no known allergies.   Review of Systems Review of Systems  Skin:  Positive for rash (left ankle stasis dermatitis and dry skin).     Physical Exam Triage Vital Signs ED Triage Vitals  Enc Vitals Group     BP 08/21/22 1749 (!) 102/54     Pulse Rate 08/21/22 1749 85     Resp 08/21/22 1749 18     Temp 08/21/22 1749 (!) 97.3 F (36.3 C)     Temp Source 08/21/22 1749 Oral     SpO2 08/21/22 1749 98 %     Weight --      Height --      Head Circumference --      Peak Flow --      Pain Score 08/21/22 1747 0     Pain Loc --      Pain Edu? --      Excl. in [place]? --    No data found.  Updated Vital Signs BP (!) 102/54 (BP Location: Right Arm)   Pulse 85   Temp (!) 97.3 F (36.3 C) (Oral)   Resp 18   SpO2 98%   Visual Acuity Right Eye Distance:   Left Eye Distance:   Bilateral Distance:    Right Eye Near:   Left Eye Near:    Bilateral Near:     Physical Exam Constitutional:      Appearance: Normal  appearance.  Musculoskeletal:       Feet:  Feet:     Comments: Left ankle: There is a 3 x 3 cm crusting stasis dermatitis area with mild weeping that is anterior ankle without redness, no streaking. Skin:         Comments: Skin: Bilateral upper arms, bilateral thighs, with dry, flaking, skin that is present.  Itchy diagnosis present.  No evidence of infection.  Neurological:     Mental Status: He is alert.      UC Treatments / Results  Labs (all labs ordered are listed, but only abnormal results are displayed) Labs Reviewed - No data to display  EKG   Radiology No results found.  Procedures Procedures (including critical care time)  Medications Ordered in UC Medications - No data to display  Initial Impression / Assessment and Plan / UC Course  I have reviewed the triage vital signs and the nursing notes.  Pertinent labs & imaging results that were available during my care of the patient were reviewed by me and considered in my medical decision making (see chart for details).    Plan: The diagnosis will be treated with the following: 1.  Stasis dermatitis left ankle: A.  Wet-to-dry dressings. B.  Advised follow-up PCP for reevaluation and treatment. 2.  Ichthyosis: A.  Advised to continue use Dove soap with moisturizing cream. B.  Advised to use Eucerin ointment applied to the skin twice daily to help decrease skin dryness. 3.  Advised follow-up PCP return to urgent care as needed.  Final Clinical Impressions(s) / UC Diagnoses   Final diagnoses:  Stasis dermatitis, acute  Ichthyosis     Discharge Instructions      Advised to use the Eucerin ointment and applied to the skin twice daily to help decrease the dry skin condition. Advised to use wet-to-dry dressings on the left ankle wound twice daily.  Advised to arrange an appointment with PCP for evaluation and follow-up within the next 5 to 7 days.  Advised to return to urgent care as needed.    ED  Prescriptions     Medication Sig Dispense Auth. Provider   Emollient (EUCERIN INTENSIVE REPAIR) OINT Apply 1 Units topically 2 (two) times daily. To dry skin twice daily. 360 g Nyoka Lint, PA-C      PDMP not reviewed this encounter.   Nyoka Lint, PA-C 08/21/22 1827

## 2022-08-21 NOTE — Discharge Instructions (Signed)
Advised to use the Eucerin ointment and applied to the skin twice daily to help decrease the dry skin condition. Advised to use wet-to-dry dressings on the left ankle wound twice daily.  Advised to arrange an appointment with PCP for evaluation and follow-up within the next 5 to 7 days.  Advised to return to urgent care as needed.

## 2022-08-22 ENCOUNTER — Other Ambulatory Visit: Payer: Self-pay

## 2022-09-23 ENCOUNTER — Other Ambulatory Visit: Payer: Self-pay

## 2022-09-23 ENCOUNTER — Other Ambulatory Visit: Payer: Self-pay | Admitting: Internal Medicine

## 2022-09-23 ENCOUNTER — Other Ambulatory Visit: Payer: Self-pay | Admitting: Physician Assistant

## 2022-09-23 DIAGNOSIS — L308 Other specified dermatitis: Secondary | ICD-10-CM

## 2022-09-23 DIAGNOSIS — R052 Subacute cough: Secondary | ICD-10-CM

## 2022-09-23 MED ORDER — CETIRIZINE HCL 10 MG PO TABS
10.0000 mg | ORAL_TABLET | Freq: Every day | ORAL | 0 refills | Status: DC
  Filled 2022-09-23: qty 90, 90d supply, fill #0

## 2022-09-23 MED ORDER — TRIAMCINOLONE ACETONIDE 0.1 % EX CREA
1.0000 | TOPICAL_CREAM | Freq: Two times a day (BID) | CUTANEOUS | 0 refills | Status: DC
  Filled 2022-09-23: qty 30, 15d supply, fill #0

## 2022-09-23 NOTE — Telephone Encounter (Signed)
Requested Prescriptions  Pending Prescriptions Disp Refills   cetirizine (ZYRTEC) 10 MG tablet 90 tablet 0    Sig: Take 1 tablet (10 mg total) by mouth daily.     Ear, Nose, and Throat:  Antihistamines 2 Passed - 09/23/2022  1:46 PM      Passed - Cr in normal range and within 360 days    Creatinine, Ser  Date Value Ref Range Status  04/09/2022 1.20 0.61 - 1.24 mg/dL Final   Creatinine, Urine  Date Value Ref Range Status  04/02/2022 81 mg/dL Final    Comment:    Performed at [facility], [place] [address] [place], [place] [address]         Passed - Valid encounter within last 12 months    Recent Outpatient Visits           6 months ago Loss of appetite   [place], Charlane Ferretti, MD   8 months ago Adhesive capsulitis of both shoulders   [place], MD   5 years ago Essential hypertension   [place], MD   5 years ago Acute kidney injury [facility])   [place], [place], [place]

## 2022-09-24 ENCOUNTER — Other Ambulatory Visit: Payer: Self-pay

## 2022-09-26 ENCOUNTER — Other Ambulatory Visit: Payer: Self-pay

## 2022-09-28 ENCOUNTER — Other Ambulatory Visit: Payer: Self-pay

## 2022-12-14 ENCOUNTER — Other Ambulatory Visit: Payer: Self-pay | Admitting: Physician Assistant

## 2022-12-14 ENCOUNTER — Other Ambulatory Visit: Payer: Self-pay | Admitting: Internal Medicine

## 2022-12-14 ENCOUNTER — Other Ambulatory Visit: Payer: Self-pay

## 2022-12-14 DIAGNOSIS — D649 Anemia, unspecified: Secondary | ICD-10-CM

## 2022-12-14 DIAGNOSIS — R052 Subacute cough: Secondary | ICD-10-CM

## 2022-12-14 DIAGNOSIS — Z8739 Personal history of other diseases of the musculoskeletal system and connective tissue: Secondary | ICD-10-CM

## 2022-12-14 MED ORDER — CETIRIZINE HCL 10 MG PO TABS
10.0000 mg | ORAL_TABLET | Freq: Every day | ORAL | 0 refills | Status: DC
  Filled 2022-12-14 – 2022-12-23 (×2): qty 30, 30d supply, fill #0

## 2022-12-14 MED ORDER — ALLOPURINOL 100 MG PO TABS
100.0000 mg | ORAL_TABLET | Freq: Every day | ORAL | 0 refills | Status: DC
  Filled 2022-12-14 – 2022-12-23 (×2): qty 30, 30d supply, fill #0

## 2022-12-14 NOTE — Telephone Encounter (Signed)
Requested medication (s) are due for refill today: Yes  Requested medication (s) are on the active medication list: yes    Last refill: 12/09/21  #15  11 refills  Future visit scheduled no  Notes to clinic:Historical Provider, please review. Thank you.  Requested Prescriptions  Pending Prescriptions Disp Refills   Iron, Ferrous Sulfate, 325 (65 Fe) MG TABS 15 tablet 11    Sig: Take 1 tablet (325 mg) by mouth every other day.     There is no refill protocol information for this order

## 2022-12-15 ENCOUNTER — Other Ambulatory Visit: Payer: Self-pay

## 2022-12-15 MED ORDER — IRON (FERROUS SULFATE) 325 (65 FE) MG PO TABS
325.0000 mg | ORAL_TABLET | ORAL | 0 refills | Status: DC
  Filled 2022-12-15 – 2022-12-23 (×2): qty 30, 60d supply, fill #0

## 2022-12-21 ENCOUNTER — Other Ambulatory Visit: Payer: Self-pay

## 2022-12-23 ENCOUNTER — Other Ambulatory Visit: Payer: Self-pay

## 2023-03-13 ENCOUNTER — Other Ambulatory Visit: Payer: Self-pay | Admitting: Internal Medicine

## 2023-03-13 ENCOUNTER — Other Ambulatory Visit: Payer: Self-pay

## 2023-03-13 DIAGNOSIS — R052 Subacute cough: Secondary | ICD-10-CM

## 2023-03-13 DIAGNOSIS — E039 Hypothyroidism, unspecified: Secondary | ICD-10-CM

## 2023-03-13 DIAGNOSIS — Z8739 Personal history of other diseases of the musculoskeletal system and connective tissue: Secondary | ICD-10-CM

## 2023-03-13 DIAGNOSIS — D649 Anemia, unspecified: Secondary | ICD-10-CM

## 2023-03-14 NOTE — Telephone Encounter (Signed)
Requested medication (s) are due for refill today:   Yes  Requested medication (s) are on the active medication list:   Yes  Future visit scheduled:   Yes 10/14 for CPE with Dr. Laural Benes   Last ordered: 12/15/2022 #30, 0 refills  Returned because OV and labs needed so unable to refill per protocol.      Requested Prescriptions  Pending Prescriptions Disp Refills   Iron, Ferrous Sulfate, 325 (65 Fe) MG TABS 30 tablet 0    Sig: Take 1 tablet (325 mg) by mouth every other day. Please make an appointment with Dr. Laural Benes for more refills.     There is no refill protocol information for this order

## 2023-03-14 NOTE — Telephone Encounter (Signed)
Called using [employer] for Arabic [phone] however his son [name] answered the phone and said he was fluent in [place] so interpreter not needed.  Appt made for [name] [name] with Dr. Laural Benes for 04/10/2023 at 2:50 for his yearly physical and medication refills.  Refill requests forwarded to Dr. Laural Benes for review for refills prior to his upcoming appt.

## 2023-03-15 ENCOUNTER — Other Ambulatory Visit: Payer: Self-pay

## 2023-03-15 MED ORDER — ALLOPURINOL 100 MG PO TABS
100.0000 mg | ORAL_TABLET | Freq: Every day | ORAL | 0 refills | Status: DC
  Filled 2023-03-15: qty 30, 30d supply, fill #0

## 2023-03-15 MED ORDER — IRON (FERROUS SULFATE) 325 (65 FE) MG PO TABS
325.0000 mg | ORAL_TABLET | ORAL | 0 refills | Status: DC
  Filled 2023-03-15: qty 30, 60d supply, fill #0

## 2023-03-15 MED ORDER — LEVOTHYROXINE SODIUM 50 MCG PO TABS
50.0000 ug | ORAL_TABLET | Freq: Every day | ORAL | 0 refills | Status: DC
  Filled 2023-03-15: qty 30, 30d supply, fill #0

## 2023-03-17 ENCOUNTER — Other Ambulatory Visit: Payer: Self-pay

## 2023-04-10 ENCOUNTER — Encounter: Payer: Self-pay | Admitting: Internal Medicine

## 2023-04-10 ENCOUNTER — Other Ambulatory Visit: Payer: Self-pay

## 2023-04-10 ENCOUNTER — Ambulatory Visit: Payer: Self-pay | Attending: Internal Medicine | Admitting: Internal Medicine

## 2023-04-10 VITALS — BP 97/60 | HR 98 | Wt 119.2 lb

## 2023-04-10 DIAGNOSIS — E039 Hypothyroidism, unspecified: Secondary | ICD-10-CM

## 2023-04-10 DIAGNOSIS — Z23 Encounter for immunization: Secondary | ICD-10-CM

## 2023-04-10 DIAGNOSIS — E43 Unspecified severe protein-calorie malnutrition: Secondary | ICD-10-CM

## 2023-04-10 DIAGNOSIS — L308 Other specified dermatitis: Secondary | ICD-10-CM

## 2023-04-10 DIAGNOSIS — R413 Other amnesia: Secondary | ICD-10-CM

## 2023-04-10 DIAGNOSIS — Z931 Gastrostomy status: Secondary | ICD-10-CM

## 2023-04-10 DIAGNOSIS — I872 Venous insufficiency (chronic) (peripheral): Secondary | ICD-10-CM

## 2023-04-10 DIAGNOSIS — Z0000 Encounter for general adult medical examination without abnormal findings: Secondary | ICD-10-CM

## 2023-04-10 DIAGNOSIS — D649 Anemia, unspecified: Secondary | ICD-10-CM

## 2023-04-10 DIAGNOSIS — Z2821 Immunization not carried out because of patient refusal: Secondary | ICD-10-CM

## 2023-04-10 MED ORDER — TRIAMCINOLONE ACETONIDE 0.1 % EX CREA
1.0000 | TOPICAL_CREAM | Freq: Two times a day (BID) | CUTANEOUS | 2 refills | Status: DC
  Filled 2023-04-10: qty 45, 30d supply, fill #0

## 2023-04-10 MED ORDER — IRON (FERROUS SULFATE) 325 (65 FE) MG PO TABS
325.0000 mg | ORAL_TABLET | ORAL | 2 refills | Status: DC
  Filled 2023-04-10 – 2023-06-16 (×2): qty 60, 120d supply, fill #0
  Filled 2023-10-02: qty 60, 120d supply, fill #1

## 2023-04-10 NOTE — Progress Notes (Unsigned)
Patient ID: [name] [name], male    DOB: [birth year]  MRN: [id number]  CC: Annual Exam (Physical. Garth Schlatter dry skin around L ankle x2-3 mo, itchy - requesting a bigger size of triamcinolone /No to shingles vax, /)   Subjective: [name] [name] is a 89 y.o. male who presents for annual exam.  Son [name] [name] is with him.  Pt offered our interpreting service via video.  Patient and son declines.  They prefer for his son to interpret for him.  Waiver was signed permitting [place] to use his son as his interpreter. His concerns today include:  history of HTN, hypothyroidism, BPH, normocytic anemia, gout, venous insufficiency, dysphagia with PEG placement 03/2022, DISH  Since I last saw him, patient was started on PEG feeding during hospitalization about 1 year ago due to aspiration pneumonia from dysphagia associated with DISH cervical spine.  I note that patient has had significant weight loss.  According to our scale he has lost about 17 pounds in the past year.  He is getting feedings 6 times a day with a can of Jevity through the PEG with water flushes. Son reports that his wife does the tube feedings and there has been no problem with the tube.  Patient sometimes tried to eat via mouth but coughs so they have restricted his feeding to the PEG tube. Patient denies any abdominal pain.  Reports he is moving his bowels okay.  No blood in the stools.  He is incontinent of bowel and bladder and wears depends underwear.  Son is requesting refill on triamcinolone cream for chronic intermittent itchy rash on the left lower leg.  He does well as long as he has the triamcinolone cream but has been out of it for 3 months.  Son reports that patient sleeps a lot during the day and is up at night.  He has had some decline in memory but still interacts with his family and his wife.  No behavioral issues or wandering off.  Patient lives with son and daughter-in-law.  He still takes Levothyroxine which is given through the PEG  tube. No longer on blood pressure medication. He is on iron supplement every other day. Both iron and Levothyroxine are crushed and given through PEG.  Patient Active Problem List   Diagnosis Date Noted   Protein-calorie malnutrition, severe 04/08/2022   Dysphagia 04/02/2022   Aspiration pneumonia (HCC) 04/02/2022   Hyponatremia 04/02/2022   Elevated troponin 04/02/2022   Acute renal failure superimposed on stage 3a chronic kidney disease (HCC) 04/02/2022   Edema 12/07/2021   Venous insufficiency of both lower extremities 12/07/2021   Eczema 12/07/2021   Hx of gout 01/24/2017   Hypothyroidism 10/30/2008   Anemia, chronic disease 09/18/2008   Essential hypertension 09/18/2008   BENIGN PROSTATIC HYPERTROPHY, WITH URINARY OBSTRUCTION 09/18/2008   Backache 09/18/2008     Current Outpatient Medications on File Prior to Visit  Medication Sig Dispense Refill   allopurinol (ZYLOPRIM) 100 MG tablet Take 1 tablet (100 mg total) by mouth daily. Must keep appointment in October 2024 for additional refills. 30 tablet 0   bisoprolol (ZEBETA) 5 MG tablet Take 1 tablet (5 mg total) by mouth once daily. 30 tablet 6   cetirizine (ZYRTEC) 10 MG tablet Take 1 tablet (10 mg total) by mouth daily. Must have office visit for refills 30 tablet 0   Emollient (EUCERIN INTENSIVE REPAIR) OINT Apply 1 Units topically 2 (two) times daily. To dry skin twice daily. 360 g 2  Iron, Ferrous Sulfate, 325 (65 Fe) MG TABS Take 1 tablet (325 mg) by mouth every other day. Please make an appointment with Dr. Laural Benes for more refills. 30 tablet 0   levothyroxine (SYNTHROID) 50 MCG tablet Take 1 tablet (50 mcg total) by mouth once daily before breakfast. 30 tablet 0   Water For Irrigation, Sterile (FREE WATER) SOLN 50 mL free water flush via PEG tube before and after each bolus 9000 mL 11   aspirin EC 81 MG tablet Take 1 tablet (81 mg total) by mouth daily. Swallow whole. (Patient not taking: Reported on 04/10/2023) 30  tablet 12   fluticasone (FLONASE) 50 MCG/ACT nasal spray Place 2 sprays into both nostrils daily. (Patient not taking: Reported on 04/10/2023) 16 g 1   triamcinolone cream (KENALOG) 0.1 % Apply 1 application topically to the affected area(s) 2 (two) times daily. (Patient not taking: Reported on 04/10/2023) 30 g 0   No current facility-administered medications on file prior to visit.    No Known Allergies  Social History   Socioeconomic History   Marital status: Married    Spouse name: Not on file   Number of children: Not on file   Years of education: Not on file   Highest education level: Not on file  Occupational History   Not on file  Tobacco Use   Smoking status: Never   Smokeless tobacco: Never  Vaping Use   Vaping status: Never Used  Substance and Sexual Activity   Alcohol use: No   Drug use: No   Sexual activity: Not Currently  Other Topics Concern   Not on file  Social History Narrative   Not on file   Social Determinants of Health   Financial Resource Strain: Not on file  Food Insecurity: Not on file  Transportation Needs: Not on file  Physical Activity: Not on file  Stress: Not on file  Social Connections: Not on file  Intimate Partner Violence: Not on file    Family History  Problem Relation Age of Onset   Stroke Neg Hx    Cancer Neg Hx     Past Surgical History:  Procedure Laterality Date   ESOPHAGOGASTRODUODENOSCOPY N/A 04/03/2022   Procedure: ESOPHAGOGASTRODUODENOSCOPY (EGD);  Surgeon: Jeani Hawking, MD;  Location: [facility] ENDOSCOPY;  Service: Gastroenterology;  Laterality: N/A;   LAMINECTOMY  12/11/2008    Bilateral L2, L3, and L4 laminotomies, done by Dr. Lovell Sheehan   LAPAROSCOPY N/A 04/06/2022   Procedure: LAPAROSCOPIC ASSISTED PERCUTANEOUS ENDOSCOPIC GASTROSTOMY PLACEMENT;  Surgeon: Manus Rudd, MD;  Location: [facility] OR;  Service: General;  Laterality: N/A;    ROS: Review of Systems Negative except as stated above  PHYSICAL EXAM: BP 97/60 (BP  Location: Left Arm, Patient Position: Sitting, Cuff Size: Normal)   Pulse 98   Wt 119 lb 3.2 oz (54.1 kg)   SpO2 98%   BMI 16.17 kg/m   Wt Readings from Last 3 Encounters:  04/10/23 119 lb 3.2 oz (54.1 kg)  04/08/22 136 lb 0.4 oz (61.7 kg)  03/10/22 132 lb 12.8 oz (60.2 kg)   Physical Exam General appearance -elderly male who appears malnourished but in NAD.  Clothing clean. Mental status -patient is oriented to person.  He is not sure what day of the week it is. Eyes -nonicteric sclera.  Slightly pale conjunctiva. Ears -moderate amount of soft wax in both ear canals. Nose - normal and patent, no erythema, discharge or polyps Mouth -tongue is moist.  Poor dental hygiene with several of his  teeth missing and plaque buildup Neck -no lymphadenopathy.  No thyromegaly. Lymphatics -no cervical axillary lymphadenopathy. Chest - clear to auscultation, no wheezes, rales or rhonchi, symmetric air entry Heart - normal rate, regular rhythm, normal S1, S2, no murmurs, rubs, clicks or gallops Abdomen -PEG tube in the left upper quadrant.  It is not adherent to the abdominal wall looks like sutures that kept it adherent to the skin have come loose thought sutures still noted on the round base.  The skin surface around the PEG has a small amounts of mucous but no redness or signs of infection.   Musculoskeletal -patient was skeletal wasting.  He ambulates with a cane.  Gait is slow and wide-based.  He requires full assistance of his son and myself in getting on the exam table Extremities -hyperpigmentation in both one third of the lower extremities. Skin -scaly flaky rash on the left lower leg above and around the ankle.     Latest Ref Rng & Units 04/09/2022    4:40 AM 04/08/2022    6:04 AM 04/04/2022    5:59 AM  CMP  Glucose 70 - 99 mg/dL 578  469  629   BUN 8 - 23 mg/dL 27  31  44   Creatinine 0.61 - 1.24 mg/dL 5.28  4.13  2.44   Sodium 135 - 145 mmol/L 138  139  138   Potassium 3.5 - 5.1  mmol/L 4.6  4.3  3.8   Chloride 98 - 111 mmol/L 105  104  107   CO2 22 - 32 mmol/L 28  26  24    Calcium 8.9 - 10.3 mg/dL 9.6  9.5  9.3    Lipid Panel     Component Value Date/Time   CHOL 166 12/07/2021 1200   TRIG 76 12/07/2021 1200   HDL 48 12/07/2021 1200   CHOLHDL 3.5 12/07/2021 1200   CHOLHDL 5.2 08/31/2008 0515   VLDL 18 08/31/2008 0515   LDLCALC 104 (H) 12/07/2021 1200    CBC    Component Value Date/Time   WBC 4.4 04/05/2022 0039   RBC 2.99 (L) 04/05/2022 0039   HGB 8.5 (L) 04/05/2022 0039   HGB 11.0 (L) 03/10/2022 1143   HCT 25.4 (L) 04/05/2022 0039   HCT 32.1 (L) 03/10/2022 1143   PLT 294 04/05/2022 0039   PLT 311 03/10/2022 1143   MCV 84.9 04/05/2022 0039   MCV 85 03/10/2022 1143   MCH 28.4 04/05/2022 0039   MCHC 33.5 04/05/2022 0039   RDW 15.1 04/05/2022 0039   RDW 13.0 03/10/2022 1143   LYMPHSABS 0.2 (L) 04/01/2022 2013   LYMPHSABS 1.9 03/10/2022 1143   MONOABS 0.2 04/01/2022 2013   EOSABS 0.0 04/01/2022 2013   EOSABS 0.2 03/10/2022 1143   BASOSABS 0.0 04/01/2022 2013   BASOSABS 0.1 03/10/2022 1143    ASSESSMENT AND PLAN:  1. Annual physical exam Discussed ways to help keep patient oriented by keeping a calendar on his wall and marked normal of the dates every day.  Try to stick to her daily routine. - Comprehensive metabolic panel  2. Protein-calorie malnutrition, severe Patient appears malnourished but this was also noted at time of hosp 1 yr ago.   Son reports he is getting the recommended 6 cans of Jevity feeding with water flushes daily as was recommended at hosp dischg.  No issues with tube being blocked or leakage around site.  However, I question whether we need to get him in with IR to make sure tube  is still in good position since it is no longer adherant to abdominal wall.  - Comprehensive metabolic panel  3. S/P percutaneous endoscopic gastrostomy (PEG) tube placement (HCC) See #3 above  4. Chronic anemia - Iron, Ferrous Sulfate, 325  (65 Fe) MG TABS; Take 1 tablet (325 mg) by mouth every other day. Dispense: 60 tablet; Refill: 2 - CBC  5. Other eczema - triamcinolone cream (KENALOG) 0.1 %; Apply 1 application topically to the affected area(s) 2 (two) times daily.  Dispense: 45 g; Refill: 2  6. Hypothyroidism, unspecified type Continue levothyroxine. - TSH  7. Venous insufficiency of both lower extremities  8. Influenza vaccination declined Offered and recommended.  Son declined.  9. Need for COVID-19 vaccine Recommended getting COVID 19 booster.  Advised that he can get this at any outside pharmacy  10. Memory changes See #1 above.     Patient and son were given the opportunity to ask questions.  [name] had to leave at the end of the visit to go to work.  York Spaniel his wife will come to [name] up pt.  She subsequently did but I did not get to speak with her.   This documentation was completed using Paediatric nurse.  Any transcriptional errors are unintentional.  No orders of the defined types were placed in this encounter.    Requested Prescriptions   Pending Prescriptions Disp Refills   cetirizine (ZYRTEC) 10 MG tablet 30 tablet 0    Sig: Take 1 tablet (10 mg total) by mouth daily. Must have office visit for refills   Iron, Ferrous Sulfate, 325 (65 Fe) MG TABS 30 tablet 0    Sig: Take 1 tablet (325 mg) by mouth every other day. Please make an appointment with Dr. Laural Benes for more refills.   triamcinolone cream (KENALOG) 0.1 % 30 g 0    Sig: Apply 1 application topically to the affected area(s) 2 (two) times daily.    No follow-ups on file.  Jonah Blue, MD, FACP

## 2023-04-10 NOTE — Patient Instructions (Signed)
We will set up an appointment with interventional radiology to have his PEG tube checked..  Please consider having him get the flu shot and an updated COVID booster.  I would like to refer him to a nutritionist to make sure that the feeding that he is getting is adequate to meet his nutritional needs.Marland Kitchen

## 2023-04-11 LAB — COMPREHENSIVE METABOLIC PANEL
ALT: 14 [IU]/L (ref 0–44)
AST: 22 [IU]/L (ref 0–40)
Albumin: 3.8 g/dL (ref 3.7–4.7)
Alkaline Phosphatase: 84 [IU]/L (ref 44–121)
BUN/Creatinine Ratio: 41 — ABNORMAL HIGH (ref 10–24)
BUN: 46 mg/dL — ABNORMAL HIGH (ref 8–27)
Bilirubin Total: 0.3 mg/dL (ref 0.0–1.2)
CO2: 23 mmol/L (ref 20–29)
Calcium: 10 mg/dL (ref 8.6–10.2)
Chloride: 102 mmol/L (ref 96–106)
Creatinine, Ser: 1.13 mg/dL (ref 0.76–1.27)
Globulin, Total: 3.1 g/dL (ref 1.5–4.5)
Glucose: 95 mg/dL (ref 70–99)
Potassium: 5.6 mmol/L — ABNORMAL HIGH (ref 3.5–5.2)
Sodium: 139 mmol/L (ref 134–144)
Total Protein: 6.9 g/dL (ref 6.0–8.5)
eGFR: 63 mL/min/{1.73_m2} (ref >59)

## 2023-04-11 LAB — CBC
Hematocrit: 36.8 % — ABNORMAL LOW (ref 37.5–51.0)
Hemoglobin: 11.6 g/dL — ABNORMAL LOW (ref 13.0–17.7)
MCH: 30.1 pg (ref 26.6–33.0)
MCHC: 31.5 g/dL (ref 31.5–35.7)
MCV: 96 fL (ref 79–97)
Platelets: 310 10*3/uL (ref 150–450)
RBC: 3.85 x10E6/uL — ABNORMAL LOW (ref 4.14–5.80)
RDW: 13 % (ref 11.6–15.4)
WBC: 4 10*3/uL (ref 3.4–10.8)

## 2023-04-11 LAB — TSH: TSH: 39.7 u[IU]/mL — ABNORMAL HIGH (ref 0.450–4.500)

## 2023-04-12 ENCOUNTER — Telehealth: Payer: Self-pay | Admitting: Internal Medicine

## 2023-04-12 ENCOUNTER — Other Ambulatory Visit: Payer: Self-pay

## 2023-04-12 DIAGNOSIS — E875 Hyperkalemia: Secondary | ICD-10-CM

## 2023-04-12 DIAGNOSIS — E039 Hypothyroidism, unspecified: Secondary | ICD-10-CM

## 2023-04-12 DIAGNOSIS — E43 Unspecified severe protein-calorie malnutrition: Secondary | ICD-10-CM

## 2023-04-12 MED ORDER — LEVOTHYROXINE SODIUM 75 MCG PO TABS
75.0000 ug | ORAL_TABLET | Freq: Every day | ORAL | 2 refills | Status: DC
  Filled 2023-04-12: qty 60, 60d supply, fill #0
  Filled 2023-06-16: qty 60, 60d supply, fill #1
  Filled 2023-10-02: qty 60, 60d supply, fill #2

## 2023-04-12 NOTE — Telephone Encounter (Signed)
Phone call placed to pt's son ,Godfrey Pick, 04/11/2023 around 6:40 p.m to go over lab results.  We did a 3 way call with his wife on the line since she does his tube feedings and give meds. I informed them that patient's anemia is better.  Daughter-in-law confirms that she is the iron supplement every other day through the feeding tube.  -Informed them that  thyroid level is abnormal with TSH of 39.  I inquired whether he has been getting the levothyroxine 50 mcg daily consistently.  Daughter-in-law confirms that she has been giving it to him consistently every day.  I recommend that we increase the dose from 50 mcg daily to 75 mcg daily.  This means if she still has some of the 50 mcg tablets, she will start giving 1-1/2 tablets daily until she runs out of the current bottle.  Updated prescription sent to the pharmacy for the 75 mcg tablets. Informed that potassium level is elevated.  High potassium can cause the heart to go into abnormal rhythms.  Patient is not on potassium supplement or any meds that should increase potassium level.  Daughter-in-law also confirms that he does not receive any orange juice through the PEG.  I requested that they bring him back to the lab on Thursday or Friday of this week for recheck of the potassium level.  They are willing to do so. I also asked the daughter-in-law whether she gets any resistance when she does the PEG feeding and/or water flushes.  She said no.  She also reports that she does not see any fluid leaking from around the PEG site after feedings.  I would like to get him to a nutritionist to make sure that the amount of feeding that he is getting is still appropriate to meet his caloric needs.  Son tells me that they are working on getting him approved again for the orange card and called financial.  04/12/2023: I spoke with the intervention radiologist Dr. Grace Isaac about patient's PEG tube not being adherent to the abdominal wall and whether it needs to be evaluated.   He states that as long as there is no leaking around the tube and the tube is still functioning normally, there is no need for evaluation or replacement.

## 2023-04-14 ENCOUNTER — Other Ambulatory Visit: Payer: Self-pay

## 2023-04-14 ENCOUNTER — Ambulatory Visit: Payer: Self-pay | Attending: Internal Medicine

## 2023-04-14 ENCOUNTER — Ambulatory Visit: Payer: Self-pay | Admitting: Dietician

## 2023-04-14 DIAGNOSIS — E875 Hyperkalemia: Secondary | ICD-10-CM

## 2023-04-15 LAB — POTASSIUM: Potassium: 5 mmol/L (ref 3.5–5.2)

## 2023-04-16 ENCOUNTER — Inpatient Hospital Stay (HOSPITAL_BASED_OUTPATIENT_CLINIC_OR_DEPARTMENT_OTHER)
Admission: EM | Admit: 2023-04-16 | Discharge: 2023-04-23 | DRG: 177 | Disposition: A | Discharge status: Home / Self Care | Payer: Self-pay | Attending: Internal Medicine | Admitting: Internal Medicine

## 2023-04-16 ENCOUNTER — Encounter (HOSPITAL_BASED_OUTPATIENT_CLINIC_OR_DEPARTMENT_OTHER): Payer: Self-pay

## 2023-04-16 ENCOUNTER — Emergency Department (HOSPITAL_BASED_OUTPATIENT_CLINIC_OR_DEPARTMENT_OTHER): Payer: Self-pay

## 2023-04-16 DIAGNOSIS — E43 Unspecified severe protein-calorie malnutrition: Secondary | ICD-10-CM | POA: Diagnosis present

## 2023-04-16 DIAGNOSIS — E870 Hyperosmolality and hypernatremia: Secondary | ICD-10-CM | POA: Diagnosis not present

## 2023-04-16 DIAGNOSIS — N400 Enlarged prostate without lower urinary tract symptoms: Secondary | ICD-10-CM | POA: Diagnosis present

## 2023-04-16 DIAGNOSIS — R4182 Altered mental status, unspecified: Secondary | ICD-10-CM

## 2023-04-16 DIAGNOSIS — I4719 Other supraventricular tachycardia: Secondary | ICD-10-CM | POA: Diagnosis present

## 2023-04-16 DIAGNOSIS — U071 COVID-19: Principal | ICD-10-CM | POA: Diagnosis present

## 2023-04-16 DIAGNOSIS — N189 Chronic kidney disease, unspecified: Secondary | ICD-10-CM | POA: Diagnosis present

## 2023-04-16 DIAGNOSIS — N139 Obstructive and reflux uropathy, unspecified: Secondary | ICD-10-CM | POA: Diagnosis present

## 2023-04-16 DIAGNOSIS — R7989 Other specified abnormal findings of blood chemistry: Secondary | ICD-10-CM | POA: Diagnosis present

## 2023-04-16 DIAGNOSIS — Z931 Gastrostomy status: Secondary | ICD-10-CM

## 2023-04-16 DIAGNOSIS — R1310 Dysphagia, unspecified: Secondary | ICD-10-CM

## 2023-04-16 DIAGNOSIS — D631 Anemia in chronic kidney disease: Secondary | ICD-10-CM | POA: Diagnosis present

## 2023-04-16 DIAGNOSIS — E860 Dehydration: Secondary | ICD-10-CM | POA: Diagnosis present

## 2023-04-16 DIAGNOSIS — I129 Hypertensive chronic kidney disease with stage 1 through stage 4 chronic kidney disease, or unspecified chronic kidney disease: Secondary | ICD-10-CM | POA: Diagnosis present

## 2023-04-16 DIAGNOSIS — R1312 Dysphagia, oropharyngeal phase: Secondary | ICD-10-CM

## 2023-04-16 DIAGNOSIS — J1282 Pneumonia due to coronavirus disease 2019: Secondary | ICD-10-CM | POA: Diagnosis present

## 2023-04-16 DIAGNOSIS — Z79890 Hormone replacement therapy (postmenopausal): Secondary | ICD-10-CM

## 2023-04-16 DIAGNOSIS — E039 Hypothyroidism, unspecified: Secondary | ICD-10-CM | POA: Diagnosis present

## 2023-04-16 DIAGNOSIS — D638 Anemia in other chronic diseases classified elsewhere: Secondary | ICD-10-CM | POA: Diagnosis present

## 2023-04-16 DIAGNOSIS — A020 Salmonella enteritis: Secondary | ICD-10-CM | POA: Diagnosis not present

## 2023-04-16 DIAGNOSIS — Z7982 Long term (current) use of aspirin: Secondary | ICD-10-CM

## 2023-04-16 DIAGNOSIS — I10 Essential (primary) hypertension: Secondary | ICD-10-CM | POA: Diagnosis present

## 2023-04-16 DIAGNOSIS — I2489 Other forms of acute ischemic heart disease: Secondary | ICD-10-CM | POA: Diagnosis present

## 2023-04-16 DIAGNOSIS — N179 Acute kidney failure, unspecified: Secondary | ICD-10-CM | POA: Insufficient documentation

## 2023-04-16 DIAGNOSIS — Z681 Body mass index (BMI) 19 or less, adult: Secondary | ICD-10-CM

## 2023-04-16 DIAGNOSIS — F0390 Unspecified dementia without behavioral disturbance: Secondary | ICD-10-CM | POA: Diagnosis present

## 2023-04-16 DIAGNOSIS — J440 Chronic obstructive pulmonary disease with acute lower respiratory infection: Secondary | ICD-10-CM | POA: Diagnosis present

## 2023-04-16 DIAGNOSIS — Z79899 Other long term (current) drug therapy: Secondary | ICD-10-CM

## 2023-04-16 DIAGNOSIS — R296 Repeated falls: Secondary | ICD-10-CM | POA: Diagnosis present

## 2023-04-16 DIAGNOSIS — R32 Unspecified urinary incontinence: Secondary | ICD-10-CM | POA: Diagnosis present

## 2023-04-16 DIAGNOSIS — R112 Nausea with vomiting, unspecified: Secondary | ICD-10-CM | POA: Diagnosis present

## 2023-04-16 DIAGNOSIS — I471 Supraventricular tachycardia, unspecified: Secondary | ICD-10-CM

## 2023-04-16 LAB — COMPREHENSIVE METABOLIC PANEL
ALT: 12 U/L (ref 0–44)
AST: 20 U/L (ref 15–41)
Albumin: 3.7 g/dL (ref 3.5–5.0)
Alkaline Phosphatase: 62 U/L (ref 38–126)
Anion gap: 7 (ref 5–15)
BUN: 52 mg/dL — ABNORMAL HIGH (ref 8–23)
CO2: 28 mmol/L (ref 22–32)
Calcium: 10.6 mg/dL — ABNORMAL HIGH (ref 8.9–10.3)
Chloride: 101 mmol/L (ref 98–111)
Creatinine, Ser: 1.44 mg/dL — ABNORMAL HIGH (ref 0.61–1.24)
GFR, Estimated: 47 mL/min — ABNORMAL LOW (ref >60)
Glucose, Bld: 137 mg/dL — ABNORMAL HIGH (ref 70–99)
Potassium: 5 mmol/L (ref 3.5–5.1)
Sodium: 136 mmol/L (ref 135–145)
Total Bilirubin: 0.5 mg/dL (ref 0.3–1.2)
Total Protein: 7.3 g/dL (ref 6.5–8.1)

## 2023-04-16 LAB — URINALYSIS, ROUTINE W REFLEX MICROSCOPIC
Bacteria, UA: NONE SEEN
Bilirubin Urine: NEGATIVE
Glucose, UA: NEGATIVE mg/dL
Ketones, ur: NEGATIVE mg/dL
Leukocytes,Ua: NEGATIVE
Nitrite: NEGATIVE
Specific Gravity, Urine: 1.028 (ref 1.005–1.030)
pH: 6 (ref 5.0–8.0)

## 2023-04-16 LAB — LACTIC ACID, PLASMA
Lactic Acid, Venous: 1.8 mmol/L (ref 0.5–1.9)
Lactic Acid, Venous: 1 mmol/L (ref 0.5–1.9)

## 2023-04-16 LAB — RESP PANEL BY RT-PCR (RSV, FLU A&B, COVID)  RVPGX2
Influenza A by PCR: NEGATIVE
Influenza B by PCR: NEGATIVE
Resp Syncytial Virus by PCR: NEGATIVE
SARS Coronavirus 2 by RT PCR: POSITIVE — AB

## 2023-04-16 LAB — CBC
HCT: 32.8 % — ABNORMAL LOW (ref 39.0–52.0)
Hemoglobin: 11 g/dL — ABNORMAL LOW (ref 13.0–17.0)
MCH: 31 pg (ref 26.0–34.0)
MCHC: 33.5 g/dL (ref 30.0–36.0)
MCV: 92.4 fL (ref 80.0–100.0)
Platelets: 248 10*3/uL (ref 150–400)
RBC: 3.55 MIL/uL — ABNORMAL LOW (ref 4.22–5.81)
RDW: 14.2 % (ref 11.5–15.5)
WBC: 8.6 10*3/uL (ref 4.0–10.5)
nRBC: 0 % (ref 0.0–0.2)

## 2023-04-16 LAB — AMMONIA: Ammonia: 20 umol/L (ref 9–35)

## 2023-04-16 LAB — LIPASE, BLOOD: Lipase: 56 U/L — ABNORMAL HIGH (ref 11–51)

## 2023-04-16 LAB — TROPONIN I (HIGH SENSITIVITY)
Troponin I (High Sensitivity): 194 ng/L (ref <18)
Troponin I (High Sensitivity): 189 ng/L (ref <18)

## 2023-04-16 MED ORDER — ACETAMINOPHEN 160 MG/5ML PO SOLN
650.0000 mg | Freq: Once | ORAL | Status: AC
  Administered 2023-04-16: 650 mg
  Filled 2023-04-16: qty 20.3

## 2023-04-16 MED ORDER — ONDANSETRON HCL 4 MG/2ML IJ SOLN
4.0000 mg | Freq: Once | INTRAMUSCULAR | Status: AC
Start: 2023-04-16 — End: 2023-04-16
  Administered 2023-04-16: 4 mg via INTRAVENOUS
  Filled 2023-04-16: qty 2

## 2023-04-16 MED ORDER — LEVOTHYROXINE SODIUM 75 MCG PO TABS
75.0000 ug | ORAL_TABLET | Freq: Every day | ORAL | Status: DC
  Administered 2023-04-17 – 2023-04-23 (×7): 75 ug
  Filled 2023-04-16 (×7): qty 1

## 2023-04-16 MED ORDER — ENOXAPARIN SODIUM 40 MG/0.4ML IJ SOSY
40.0000 mg | PREFILLED_SYRINGE | INTRAMUSCULAR | Status: DC
  Administered 2023-04-17: 40 mg via SUBCUTANEOUS
  Filled 2023-04-16: qty 0.4

## 2023-04-16 MED ORDER — FERROUS SULFATE 325 (65 FE) MG PO TABS
325.0000 mg | ORAL_TABLET | ORAL | Status: DC
  Administered 2023-04-17: 325 mg via ORAL
  Filled 2023-04-16: qty 1

## 2023-04-16 MED ORDER — IOHEXOL 300 MG/ML  SOLN
100.0000 mL | Freq: Once | INTRAMUSCULAR | Status: AC | PRN
  Administered 2023-04-16: 100 mL via INTRAVENOUS

## 2023-04-16 MED ORDER — ONDANSETRON HCL 4 MG/2ML IJ SOLN
4.0000 mg | Freq: Four times a day (QID) | INTRAMUSCULAR | Status: DC | PRN
  Administered 2023-04-17: 4 mg via INTRAVENOUS
  Filled 2023-04-16 (×3): qty 2

## 2023-04-16 MED ORDER — LOPERAMIDE HCL 1 MG/7.5ML PO SUSP
2.0000 mg | ORAL | Status: DC | PRN
  Administered 2023-04-17 (×3): 2 mg
  Filled 2023-04-16 (×4): qty 15

## 2023-04-16 MED ORDER — LACTATED RINGERS IV BOLUS
1000.0000 mL | Freq: Once | INTRAVENOUS | Status: AC
  Administered 2023-04-16: 1000 mL via INTRAVENOUS

## 2023-04-16 MED ORDER — ALLOPURINOL 100 MG PO TABS
50.0000 mg | ORAL_TABLET | ORAL | Status: DC
Start: 2023-04-17 — End: 2023-04-23
  Administered 2023-04-17 – 2023-04-23 (×4): 50 mg
  Filled 2023-04-16 (×4): qty 1

## 2023-04-16 MED ORDER — METOPROLOL TARTRATE 5 MG/5ML IV SOLN: 5.0000 mg | Freq: Once | INTRAVENOUS | Status: DC

## 2023-04-16 MED ORDER — LOPERAMIDE HCL 2 MG PO CAPS: 2.0000 mg | ORAL_CAPSULE | ORAL | Status: DC | PRN

## 2023-04-16 MED ORDER — LACTATED RINGERS IV SOLN: INTRAVENOUS | Status: AC

## 2023-04-16 MED ORDER — JEVITY 1.2 CAL PO LIQD
237.0000 mL | ORAL | Status: DC
  Administered 2023-04-17 – 2023-04-18 (×7): 237 mL
  Filled 2023-04-16 (×12): qty 237

## 2023-04-16 NOTE — ED Notes (Signed)
Attempted to call report to IP RN, RN unavailable at this time, RN will call back when available.

## 2023-04-16 NOTE — Progress Notes (Addendum)
Acceptance note:    89 y/o M with dementia, dysphagia s/p PEG tube, hypothyroidism with recently adjusted thyroid medication-presents to [facility] ED with N/V/D x 2 days, labs with mild AKI on CKD (creatinine 1.1->1.4) suggesting dehydration and also with elevated troponin 1 80-1 90s (previously 30-80).  Patient has dementia, no complaints of chest pain but concern for increasing confusion/falls recently.  CT head/CT abdomen unremarkable.  Patient did have an episode of SVT @ 170 b/m lasting less than 5 minutes and spontaneously resolved in the ED. ? Troponin elevation secondary to SVT episodes at home.  EDP to discuss case with cardiology, echo to be ordered upon arrival.  Received 1 L of IV fluids in the ED. Accepted to telemetry OBS bed.    Addendum 6.40pm: Notified by [facility] EDP that patient started having fevers and COVID testing was sent an hour back--now returned positive. Hemodynamically stable. Will need contact isolation.

## 2023-04-16 NOTE — Assessment & Plan Note (Signed)
-  recent TSH of 39. PCP had increase his levothyroxine from 50 to last week.

## 2023-04-16 NOTE — ED Provider Notes (Signed)
EMERGENCY DEPARTMENT AT [facility] Provider Note   CSN: [id number] Arrival date & time: 04/16/23  7829     History  Chief Complaint  Patient presents with   Emesis    [name] [name] is a 89 y.o. male.  HPI     89yo male with history of BPH, hypertension, dementia, GT feedings, presents with concern for nausea, vomiting, diarrhea, generalized weakness, increased confusion.  Reports for the last few days has had nausea, vomiting, diarrhea. Not vomiting much up last night as not much left, but having severe nausea, dry heaves. 8 episodes of diarrhea. Unclear if abdominal pain. Denied chest pain, dyspnea, cough. No known fever but did appear to have chills/was shaky.  Has some dementia at baseline but more confused, has had falls including a fall in the bathroom this AM.   He is arabic speaking and son would like to interpret in the setting of his dementia  Past Medical History:  Diagnosis Date   Back pain 10/2008   per MRI 11/06/2008 - severe spinal stenosis worse at L4-5, L3-4, with neural compression at those levels   BPH (benign prostatic hyperplasia)    Eczema    Gout    Hypertension    Hypothyroidism      Home Medications Prior to Admission medications   Medication Sig Start Date End Date Taking? Authorizing Provider  allopurinol (ZYLOPRIM) 100 MG tablet Take 1 tablet (100 mg total) by mouth daily. Must keep appointment in October 2024 for additional refills. 03/15/23   Marcine Matar, MD  aspirin EC 81 MG tablet Take 1 tablet (81 mg total) by mouth daily. Swallow whole. Patient not taking: Reported on 04/10/2023 12/07/21   Mayers, Cari S, PA-C  bisoprolol (ZEBETA) 5 MG tablet Take 1 tablet (5 mg total) by mouth once daily. 04/09/22   Marcine Matar, MD  cetirizine (ZYRTEC) 10 MG tablet Take 1 tablet (10 mg total) by mouth daily. Must have office visit for refills 12/14/22   Marcine Matar, MD  Emollient (EUCERIN INTENSIVE REPAIR) OINT Apply 1  Units topically 2 (two) times daily. To dry skin twice daily. 08/21/22   Ellsworth Lennox, PA-C  fluticasone [facility]) 50 MCG/ACT nasal spray Place 2 sprays into both nostrils daily. Patient not taking: Reported on 04/10/2023 03/10/22   Hoy Register, MD  Iron, Ferrous Sulfate, 325 (65 Fe) MG TABS Take 1 tablet (325 mg) by mouth every other day. Please make an appointment with Dr. Laural Benes for more refills. 04/10/23   Marcine Matar, MD  levothyroxine (SYNTHROID) 75 MCG tablet Take 1 tablet (75 mcg total) by mouth daily before breakfast. 04/12/23   Marcine Matar, MD  triamcinolone cream (KENALOG) 0.1 % Apply 1 application topically to the affected area(s) 2 (two) times daily. 04/10/23   Marcine Matar, MD  Water For Irrigation, Sterile (FREE WATER) SOLN 50 mL free water flush via PEG tube before and after each bolus 04/08/22   Ghimire, Werner Lean, MD      Allergies    Patient has no known allergies.    Review of Systems   Review of Systems  Physical Exam Updated Vital Signs BP (!) 109/50 (BP Location: Right Arm)   Pulse 85   Temp 99 F (37.2 C) (Oral)   Resp 18   Ht 6' (1.829 m)   Wt 52.3 kg   SpO2 100%   BMI 15.64 kg/m  Physical Exam Vitals and nursing note reviewed.  Constitutional:  General: He is not in acute distress.    Appearance: He is well-developed and underweight. He is ill-appearing. He is not diaphoretic.  HENT:     Head: Normocephalic and atraumatic.  Eyes:     Conjunctiva/sclera: Conjunctivae normal.  Cardiovascular:     Rate and Rhythm: Normal rate and regular rhythm.     Heart sounds: Normal heart sounds. No murmur heard.    No friction rub. No gallop.  Pulmonary:     Effort: Pulmonary effort is normal. No respiratory distress.     Breath sounds: Normal breath sounds. No wheezing or rales.  Abdominal:     General: There is no distension.     Palpations: Abdomen is soft.     Tenderness: There is abdominal tenderness (mild diffuse). There is no  guarding.     Comments: GT in place   Musculoskeletal:     Cervical back: Normal range of motion.  Skin:    General: Skin is warm and dry.  Neurological:     Mental Status: He is alert.     Comments: States in [place], year 1495 (baseline per pt) Left arm with difficulty lifting at shoulder and holign up, baseline per son since prior left arm injury.   Normal bilateral LE strength, symmetric facies     ED Results / Procedures / Treatments   Labs (all labs ordered are listed, but only abnormal results are displayed) Labs Reviewed  RESP PANEL BY RT-PCR (RSV, FLU A&B, COVID)  RVPGX2 - Abnormal; Notable for the following components:      Result Value   SARS Coronavirus 2 by RT PCR POSITIVE (*)    All other components within normal limits  LIPASE, BLOOD - Abnormal; Notable for the following components:   Lipase 56 (*)    All other components within normal limits  COMPREHENSIVE METABOLIC PANEL - Abnormal; Notable for the following components:   Glucose, Bld 137 (*)    BUN 52 (*)    Creatinine, Ser 1.44 (*)    Calcium 10.6 (*)    GFR, Estimated 47 (*)    All other components within normal limits  CBC - Abnormal; Notable for the following components:   RBC 3.55 (*)    Hemoglobin 11.0 (*)    HCT 32.8 (*)    All other components within normal limits  URINALYSIS, ROUTINE W REFLEX MICROSCOPIC - Abnormal; Notable for the following components:   Hgb urine dipstick TRACE (*)    Protein, ur TRACE (*)    All other components within normal limits  TROPONIN I (HIGH SENSITIVITY) - Abnormal; Notable for the following components:   Troponin I (High Sensitivity) 194 (*)    All other components within normal limits  TROPONIN I (HIGH SENSITIVITY) - Abnormal; Notable for the following components:   Troponin I (High Sensitivity) 189 (*)    All other components within normal limits  CULTURE, BLOOD (ROUTINE X 2)  CULTURE, BLOOD (ROUTINE X 2)  GASTROINTESTINAL PANEL BY PCR, STOOL (REPLACES STOOL  CULTURE)  C DIFFICILE QUICK SCREEN W PCR REFLEX    LACTIC ACID, PLASMA  LACTIC ACID, PLASMA  AMMONIA    EKG EKG Interpretation Date/Time:  Sunday April 16 2023 12:55:53 EDT Ventricular Rate:  115 PR Interval:  167 QRS Duration:  90 QT Interval:  324 QTC Calculation: 449 R Axis:   84  Text Interpretation: Sinus tachycardia Ventricular premature complex Borderline right axis deviation Probable anteroseptal infarct, old Artifact in lead(s) I II III aVR aVL V1  V2 V3 V6 Since prior ECG< rhythm now sinus Confirmed by Alvira Monday ([id number]) on 04/16/2023 9:26:09 PM  Radiology DG Chest Portable 1 View  Result Date: 04/16/2023 CLINICAL DATA:  Vomiting, and diarrhea, and weakness. Altered mental status. EXAM: PORTABLE CHEST 1 VIEW COMPARISON:  04/01/2022 FINDINGS: The heart size and mediastinal contours are within normal limits. Marked pulmonary hyperinflation is seen, consistent with COPD. Both lungs are clear. Old left humeral neck fracture is again noted. IMPRESSION: COPD. No active disease. Electronically Signed   By: Danae Orleans M.D.   On: 04/16/2023 13:30   CT ABDOMEN PELVIS W CONTRAST  Result Date: 04/16/2023 CLINICAL DATA:  Diarrhea EXAM: CT ABDOMEN AND PELVIS WITH CONTRAST TECHNIQUE: Multidetector CT imaging of the abdomen and pelvis was performed using the standard protocol following bolus administration of intravenous contrast. RADIATION DOSE REDUCTION: This exam was performed according to the departmental dose-optimization program which includes automated exposure control, adjustment of the mA and/or kV according to patient size and/or use of iterative reconstruction technique. CONTRAST:  OMNIPAQUE IOHEXOL 300 MG/ML  SOLN COMPARISON:  CT scan abdomen from 04/05/2022. FINDINGS: Examination is moderately limited due to patient's motion during data acquisition. Lower chest: There are patchy opacities in the left lung lower lobe and minimal opacities in the right lung lower lobe,  which may represent focal pneumonitis and/or atelectasis. Correlate clinically. The lung bases are otherwise clear. No pleural effusion. The heart is normal in size. No pericardial effusion. Hepatobiliary: The liver is normal in size. Non-cirrhotic configuration. No suspicious mass. No intrahepatic or extrahepatic bile duct dilation. Limited evaluation of gallbladder due to extensive motion. Pancreas: Unremarkable. No pancreatic ductal dilatation or surrounding inflammatory changes. Spleen: Within normal limits. No focal lesion. Adrenals/Urinary Tract: Adrenal glands are unremarkable. Markedly limited evaluation of bilateral kidneys due to extensive motion. There scattered subcentimeter hypoattenuating foci, too small to adequately characterize. No hydronephrosis seen. No nephroureterolithiasis. Unremarkable urinary bladder. Stomach/Bowel: Left upper quadrant gastrostomy noted. No disproportionate dilation of the small or large bowel loops. No evidence of abnormal bowel wall thickening or inflammatory changes. The appendix was not visualized; however there is no acute inflammatory process in the right lower quadrant. Vascular/Lymphatic: No ascites or pneumoperitoneum. No abdominal or pelvic lymphadenopathy, by size criteria. No aneurysmal dilation of the major abdominal arteries. There are moderate peripheral atherosclerotic vascular calcifications of the aorta and its major branches. Reproductive: Enlarged prostate with median lobe projecting into the bladder base. Other: The visualized soft tissues and abdominal wall are unremarkable. Musculoskeletal: No suspicious osseous lesions. There are moderate - marked multilevel degenerative changes in the visualized spine. L4-5 posterior spinal fixation noted. IMPRESSION: 1. Examination is moderately limited due to patient's motion during data acquisition. 2. No acute findings in the abdomen or pelvis. 3. Patchy opacities in the left lung lower lobe and minimal opacities  in the right lung lower lobe, which may represent focal pneumonitis and/or atelectasis. Correlate clinically. 4. Enlarged prostate with median lobe projecting into the bladder base. 5. Multiple other nonacute observations, as described above. Aortic Atherosclerosis (ICD10-I70.0). Electronically Signed   By: Jules Schick M.D.   On: 04/16/2023 12:05   CT Head Wo Contrast  Result Date: 04/16/2023 CLINICAL DATA:  Altered mental status, nontraumatic. Vomiting and nausea for 3 days EXAM: CT HEAD WITHOUT CONTRAST TECHNIQUE: Contiguous axial images were obtained from the base of the skull through the vertex without intravenous contrast. RADIATION DOSE REDUCTION: This exam was performed according to the departmental dose-optimization program which includes automated exposure  control, adjustment of the mA and/or kV according to patient size and/or use of iterative reconstruction technique. COMPARISON:  04/21/2022 FINDINGS: Brain: No evidence of acute infarction, hemorrhage, hydrocephalus, extra-axial collection or mass lesion/mass effect. Generalized atrophy with chronic small vessel ischemia. Vascular: No hyperdense vessel or unexpected calcification. Skull: Normal. Negative for fracture or focal lesion. Sinuses/Orbits: No acute finding. Other: Mild motion artifact but overall diagnostic scan. IMPRESSION: Involuting brain without acute or interval finding. Electronically Signed   By: Tiburcio Pea M.D.   On: 04/16/2023 11:52    Procedures Procedures    Medications Ordered in ED Medications  lactated ringers bolus 1,000 mL (0 mLs Intravenous Stopped 04/16/23 1231)  iohexol (OMNIPAQUE) 300 MG/ML solution 100 mL (100 mLs Intravenous Contrast Given 04/16/23 1110)  ondansetron (ZOFRAN) injection 4 mg (4 mg Intravenous Given 04/16/23 1321)  acetaminophen (TYLENOL) 160 MG/5ML solution 650 mg (650 mg Per Tube Given 04/16/23 1402)    ED Course/ Medical Decision Making/ A&P                                     89yo male with history of BPH, hypertension, dementia, GT feedings, presents with concern for nausea, vomiting, diarrhea, generalized weakness, increased confusion.  DDx includes ICH, electrolyte abnormalities, anemia, hepatic encephalopathy, sepsis, ACS, dehydration, colitis, gastroenteritis, intraabdominal infection. No focal neuro changes, doubt CVA.   CT head completed and evaluated by me and shows no acute abnormalities.  Labs evaluated by me and show no sign of clinically significant electrolyte abnormality, mild anemia, no leukocytosis, Cr mildly elevated in comparison to most recent, BUN elevated, calcium slightly elevated.  Troponin elevated to 194, stable on recheck.  Overall, suspect this is secondary to stress, acute illness, consider myocarditis. Does not described acute cp or dyspnea, trend of elevation not consistent with MI. UA negative.  CT abdomen pelvis completed given vomiting, diarrhea, some discomfort shows no acute intraabdominal findings.  Developed brief episode of SVT that stopped spontaneously.  Troponin elevated prior to this> Cardiology consulted regarding troponin elevation, arrhythmia.   COVID 19 positive.  Suspect encephalopathy and dehydration secondary to COVID 19.  Blood cx pending.        Final Clinical Impression(s) / ED Diagnoses Final diagnoses:  COVID-19  Elevated troponin  Dehydration  SVT (supraventricular tachycardia) (HCC)  Nausea vomiting and diarrhea  Altered mental status, unspecified altered mental status type    Rx / DC Orders ED Discharge Orders     None         Alvira Monday, MD 04/16/23 2255

## 2023-04-16 NOTE — ED Triage Notes (Signed)
Patient has G-Tube and only gets feedings through tube

## 2023-04-16 NOTE — ED Triage Notes (Signed)
Onset three days of vomiting and diarrhea.  Denies abdominal pain

## 2023-04-16 NOTE — H&P (Incomplete)
History and Physical    Patient: [name] [name] KVQ:[id number] DOB: 1934/03/14 DOA: 04/16/2023 DOS: the patient was seen and examined on 04/16/2023 PCP: Marcine Matar, MD  Patient coming from:  [facility]  Chief Complaint:  Chief Complaint  Patient presents with  . Emesis   HPI: [name] [name] is a 89 y.o. male with medical history significant of dysphagia s/p PEG tube, hypertension, hypothyroidism, severe protein malnutrition, BPH, anemia who presents with nausea, vomiting and diarrhea.  Pt speaks Arabic and has dementia. Son at bedside wants to translate for him. Says he has been having nausea, vomiting and diarrhea x 3 days. Had 3 falls since last night due to increase weakness. Finally came to ED because pt is incontinent and his diarrhea was too much for family to take care of.    On arrival to the ED, he was initially afebrile, tachycardic and normotensive.  However later developed a fever of 101.8 and was found to be COVID-positive.  CBC was negative for leukocytosis, hemoglobin of 11.  BMP notable for elevated creatinine of 1.44 from prior of 1.13.  UA was negative.  Troponin was elevated at 194 to 189. Patient reportedly had an episode of SVT at 170 bpm lasting for about 5 minutes that spontaneously resolved in the ED.  EDP consulted cardiology and they recommended echocardiogram. Pt did not have any chest pain or shortness of breath.   Review of Systems: unable to review all systems due to the inability of the patient to answer questions. Past Medical History:  Diagnosis Date  . Back pain 10/2008   per MRI 11/06/2008 - severe spinal stenosis worse at L4-5, L3-4, with neural compression at those levels  . BPH (benign prostatic hyperplasia)   . Eczema   . Gout   . Hypertension   . Hypothyroidism    Past Surgical History:  Procedure Laterality Date  . ESOPHAGOGASTRODUODENOSCOPY N/A 04/03/2022   Procedure: ESOPHAGOGASTRODUODENOSCOPY (EGD);  Surgeon: Jeani Hawking, MD;   Location: [facility] ENDOSCOPY;  Service: Gastroenterology;  Laterality: N/A;  . LAMINECTOMY  12/11/2008    Bilateral L2, L3, and L4 laminotomies, done by Dr. Lovell Sheehan  . LAPAROSCOPY N/A 04/06/2022   Procedure: LAPAROSCOPIC ASSISTED PERCUTANEOUS ENDOSCOPIC GASTROSTOMY PLACEMENT;  Surgeon: Manus Rudd, MD;  Location: [facility] OR;  Service: General;  Laterality: N/A;   Social History:  reports that he has never smoked. He has never used smokeless tobacco. He reports that he does not drink alcohol and does not use drugs.  No Known Allergies  Family History  Problem Relation Age of Onset  . Stroke Neg Hx   . Cancer Neg Hx     Prior to Admission medications   Medication Sig Start Date End Date Taking? Authorizing Provider  allopurinol (ZYLOPRIM) 100 MG tablet Take 1 tablet (100 mg total) by mouth daily. Must keep appointment in October 2024 for additional refills. 03/15/23   Marcine Matar, MD  aspirin EC 81 MG tablet Take 1 tablet (81 mg total) by mouth daily. Swallow whole. Patient not taking: Reported on 04/10/2023 12/07/21   Mayers, Cari S, PA-C  bisoprolol (ZEBETA) 5 MG tablet Take 1 tablet (5 mg total) by mouth once daily. 04/09/22   Marcine Matar, MD  cetirizine (ZYRTEC) 10 MG tablet Take 1 tablet (10 mg total) by mouth daily. Must have office visit for refills 12/14/22   Marcine Matar, MD  Emollient (EUCERIN INTENSIVE REPAIR) OINT Apply 1 Units topically 2 (two) times daily. To dry skin twice daily. 08/21/22  Ellsworth Lennox, PA-C  fluticasone [facility]) 50 MCG/ACT nasal spray Place 2 sprays into both nostrils daily. Patient not taking: Reported on 04/10/2023 03/10/22   Hoy Register, MD  Iron, Ferrous Sulfate, 325 (65 Fe) MG TABS Take 1 tablet (325 mg) by mouth every other day. Please make an appointment with Dr. Laural Benes for more refills. 04/10/23   Marcine Matar, MD  levothyroxine (SYNTHROID) 75 MCG tablet Take 1 tablet (75 mcg total) by mouth daily before breakfast. 04/12/23    Marcine Matar, MD  triamcinolone cream (KENALOG) 0.1 % Apply 1 application topically to the affected area(s) 2 (two) times daily. 04/10/23   Marcine Matar, MD  Water For Irrigation, Sterile (FREE WATER) SOLN 50 mL free water flush via PEG tube before and after each bolus 04/08/22   Ghimire, Werner Lean, MD    Physical Exam: Vitals:   04/16/23 1700 04/16/23 2000 04/16/23 2025 04/16/23 2130  BP: 125/61 115/73  (!) 109/50  Pulse: 84 78  85  Resp: 20 16  18   Temp:   99.4 F (37.4 C) 99 F (37.2 C)  TempSrc:   Rectal Oral  SpO2: 99% 100%  100%  Weight:    52.3 kg  Height:       *** Data Reviewed: {Tip this will not be part of the note when signed- Document your independent interpretation of telemetry tracing, EKG, lab, Radiology test or any other diagnostic tests. Add any new diagnostic test ordered today. (Optional):[id number]} {Results:26384}  Assessment and Plan: No notes have been filed under this hospital service. Service: Hospitalist     Advance Care Planning:   Code Status: Prior ***  Consults: ***  Family Communication: ***  Severity of Illness: {Observation/Inpatient:21159}  Author: Anselm Jungling, DO 04/16/2023 10:31 PM  For on call review [email or web address].

## 2023-04-16 NOTE — ED Notes (Signed)
Patient presents shacking all over.  Breathing rapidly labored but clear.  Speaks Arabic but son states patient is confused at base line making it difficult for translation.

## 2023-04-16 NOTE — H&P (Signed)
History and Physical    Patient: [name] [name] YQI:[id number] DOB: [birth year] DOA: 04/16/2023 DOS: the patient was seen and examined on 04/17/2023 PCP: Marcine Matar, MD  Patient coming from:  [facility]  Chief Complaint:  Chief Complaint  Patient presents with   Emesis   HPI: [name] [name] is a 89 y.o. male with medical history significant of dysphagia s/p PEG tube, hypertension, hypothyroidism, severe protein malnutrition, BPH, anemia who presents with nausea, vomiting and diarrhea.  Pt speaks Arabic and has dementia. Son at bedside wants to translate for him. Says he has been having nausea, vomiting and diarrhea x 3 days. Had 3 falls since last night due to increase weakness. Finally came to ED because pt is incontinent and his diarrhea was too much for family to take care of.    On arrival to the ED, he was initially afebrile, tachycardic and normotensive.  However later developed a fever of 101.8 and was found to be COVID-positive.  CBC was negative for leukocytosis, hemoglobin of 11.  BMP notable for elevated creatinine of 1.44 from prior of 1.13.  UA was negative.  Troponin was elevated at 194 to 189. Patient reportedly had an episode of SVT at 170 bpm lasting for about 5 minutes that spontaneously resolved in the ED.  EDP consulted cardiology and they recommended echocardiogram. Pt did not have any chest pain or shortness of breath.   Review of Systems: unable to review all systems due to the inability of the patient to answer questions. Past Medical History:  Diagnosis Date   Back pain 10/2008   per MRI 11/06/2008 - severe spinal stenosis worse at L4-5, L3-4, with neural compression at those levels   BPH (benign prostatic hyperplasia)    Eczema    Gout    Hypertension    Hypothyroidism    Past Surgical History:  Procedure Laterality Date   ESOPHAGOGASTRODUODENOSCOPY N/A 04/03/2022   Procedure: ESOPHAGOGASTRODUODENOSCOPY (EGD);  Surgeon: Jeani Hawking, MD;   Location: [facility] ENDOSCOPY;  Service: Gastroenterology;  Laterality: N/A;   LAMINECTOMY  12/11/2008    Bilateral L2, L3, and L4 laminotomies, done by Dr. Lovell Sheehan   LAPAROSCOPY N/A 04/06/2022   Procedure: LAPAROSCOPIC ASSISTED PERCUTANEOUS ENDOSCOPIC GASTROSTOMY PLACEMENT;  Surgeon: Manus Rudd, MD;  Location: [facility] OR;  Service: General;  Laterality: N/A;   Social History:  reports that he has never smoked. He has never used smokeless tobacco. He reports that he does not drink alcohol and does not use drugs.  No Known Allergies  Family History  Problem Relation Age of Onset   Stroke Neg Hx    Cancer Neg Hx     Prior to Admission medications   Medication Sig Start Date End Date Taking? Authorizing Provider  allopurinol (ZYLOPRIM) 100 MG tablet Take 1 tablet (100 mg total) by mouth daily. Must keep appointment in October 2024 for additional refills. 03/15/23   Marcine Matar, MD  aspirin EC 81 MG tablet Take 1 tablet (81 mg total) by mouth daily. Swallow whole. Patient not taking: Reported on 04/10/2023 12/07/21   Mayers, Cari S, PA-C  bisoprolol (ZEBETA) 5 MG tablet Take 1 tablet (5 mg total) by mouth once daily. 04/09/22   Marcine Matar, MD  cetirizine (ZYRTEC) 10 MG tablet Take 1 tablet (10 mg total) by mouth daily. Must have office visit for refills 12/14/22   Marcine Matar, MD  Emollient (EUCERIN INTENSIVE REPAIR) OINT Apply 1 Units topically 2 (two) times daily. To dry skin twice daily. 08/21/22  Ellsworth Lennox, PA-C  fluticasone [facility]) 50 MCG/ACT nasal spray Place 2 sprays into both nostrils daily. Patient not taking: Reported on 04/10/2023 03/10/22   Hoy Register, MD  Iron, Ferrous Sulfate, 325 (65 Fe) MG TABS Take 1 tablet (325 mg) by mouth every other day. Please make an appointment with Dr. Laural Benes for more refills. 04/10/23   Marcine Matar, MD  levothyroxine (SYNTHROID) 75 MCG tablet Take 1 tablet (75 mcg total) by mouth daily before breakfast. 04/12/23   Marcine Matar, MD  triamcinolone cream (KENALOG) 0.1 % Apply 1 application topically to the affected area(s) 2 (two) times daily. 04/10/23   Marcine Matar, MD  Water For Irrigation, Sterile (FREE WATER) SOLN 50 mL free water flush via PEG tube before and after each bolus 04/08/22   Maretta Bees, MD    Physical Exam: Vitals:   04/16/23 2000 04/16/23 2025 04/16/23 2130 04/17/23 0120  BP: 115/73  (!) 109/50 (!) 119/54  Pulse: 78  85 87  Resp: 16  18 18   Temp:  99.4 F (37.4 C) 99 F (37.2 C) 98.9 F (37.2 C)  TempSrc:  Rectal Oral Oral  SpO2: 100%  100% 100%  Weight:   52.3 kg   Height:       Constitutional: NAD, calm, comfortable, thin cachectic male lying in bed Eyes: lids and conjunctivae normal ENMT: Mucous membranes are moist.  Neck: normal, supple Respiratory: clear to auscultation bilaterally, no wheezing, no crackles. Normal respiratory effort. No accessory muscle use.  Cardiovascular: Regular rate and rhythm, no murmurs / rubs / gallops. No extremity edema.  Abdomen: Soft, nontender, cachectic abdomen.  PEG tube in place with holder not attached to skin but no drainage musculoskeletal: no clubbing / cyanosis. No joint deformity upper and lower extremities.  Muscle wasting on all extremities Skin: no rashes, lesions, ulcers. No induration Neurologic: CN 2-12 grossly intact. Strength 5/5 in all 4.  Psychiatric: Unable to assess as patient has dementia and there is a language barrier.  Normal mood.   Data Reviewed:  See HPI  Assessment and Plan: * COVID-19 virus infection With ongoing GI symptoms -Unable to initiate Paxlovid due to low creatinine clearance -Treat symptomatically with antiemetics and Imodium  SVT (supraventricular tachycardia) (HCC) - SVT with aberrancy.  Cardiology suspect related to COVID.  Recommended echocardiogram.  AKI (acute kidney injury) (HCC) - Creatinine elevated at 1.44 from prior of 1.13 - Keep on continuous IV fluids  overnight -renally dose all medications  Elevated troponin -Troponin of 194-189. Pt also had brief episode of SVT with aberrancy in the ED.  Curbside with cardiology on-call cough fellow.  Elevated troponin likely demand from AKI and COVID.  Will obtain echocardiogram in the morning and can consult cardiology as needed.  Dysphagia S/p PEG tube -Keep n.p.o. patient aspirates when he takes anything orally -continue daily Jevity feeds 6x daily  Essential hypertension -not on antihypertensives at home  Anemia, chronic disease Stable -New home iron supplementation  Hypothyroidism -recent TSH of 39. PCP had increase his levothyroxine from 50 to last week.       Advance Care Planning:   Code Status: Full Code   Consults: curbside cardiology  Family Communication: Son at bedside  Severity of Illness: The appropriate patient status for this patient is OBSERVATION. Observation status is judged to be reasonable and necessary in order to provide the required intensity of service to ensure the patient's safety. The patient's presenting symptoms, physical exam findings, and initial  radiographic and laboratory data in the context of their medical condition is felt to place them at decreased risk for further clinical deterioration. Furthermore, it is anticipated that the patient will be medically stable for discharge from the hospital within 2 midnights of admission.   Author: Anselm Jungling, DO 04/17/2023 1:42 AM  For on call review [email or web address].

## 2023-04-17 ENCOUNTER — Observation Stay (HOSPITAL_COMMUNITY): Payer: Self-pay

## 2023-04-17 DIAGNOSIS — Z931 Gastrostomy status: Secondary | ICD-10-CM

## 2023-04-17 DIAGNOSIS — R9431 Abnormal electrocardiogram [ECG] [EKG]: Secondary | ICD-10-CM

## 2023-04-17 DIAGNOSIS — U071 COVID-19: Principal | ICD-10-CM | POA: Diagnosis present

## 2023-04-17 LAB — C DIFFICILE QUICK SCREEN W PCR REFLEX
C Diff antigen: NEGATIVE
C Diff interpretation: NOT DETECTED
C Diff toxin: NEGATIVE

## 2023-04-17 LAB — ECHOCARDIOGRAM COMPLETE
Area-P 1/2: 3.13 cm2
Height: 72 in
S' Lateral: 2.6 cm
Weight: 1844.81 [oz_av]

## 2023-04-17 LAB — GASTROINTESTINAL PANEL BY PCR, STOOL (REPLACES STOOL CULTURE)

## 2023-04-17 LAB — CBC
HCT: 33.7 % — ABNORMAL LOW (ref 39.0–52.0)
Hemoglobin: 11 g/dL — ABNORMAL LOW (ref 13.0–17.0)
MCH: 30.2 pg (ref 26.0–34.0)
MCHC: 32.6 g/dL (ref 30.0–36.0)
MCV: 92.6 fL (ref 80.0–100.0)
Platelets: 284 10*3/uL (ref 150–400)
RBC: 3.64 MIL/uL — ABNORMAL LOW (ref 4.22–5.81)
RDW: 14.2 % (ref 11.5–15.5)
WBC: 5.8 10*3/uL (ref 4.0–10.5)
nRBC: 0 % (ref 0.0–0.2)

## 2023-04-17 LAB — BASIC METABOLIC PANEL
Anion gap: 12 (ref 5–15)
BUN: 53 mg/dL — ABNORMAL HIGH (ref 8–23)
CO2: 24 mmol/L (ref 22–32)
Calcium: 10 mg/dL (ref 8.9–10.3)
Chloride: 102 mmol/L (ref 98–111)
Creatinine, Ser: 1.48 mg/dL — ABNORMAL HIGH (ref 0.61–1.24)
GFR, Estimated: 46 mL/min — ABNORMAL LOW (ref >60)
Glucose, Bld: 107 mg/dL — ABNORMAL HIGH (ref 70–99)
Potassium: 5.1 mmol/L (ref 3.5–5.1)
Sodium: 138 mmol/L (ref 135–145)

## 2023-04-17 LAB — TSH: TSH: 18.819 u[IU]/mL — ABNORMAL HIGH (ref 0.350–4.500)

## 2023-04-17 LAB — LIPID PANEL
Cholesterol: 129 mg/dL (ref 0–200)
HDL: 44 mg/dL (ref >40)
LDL Cholesterol: 72 mg/dL (ref 0–99)
Total CHOL/HDL Ratio: 2.9 {ratio}
Triglycerides: 64 mg/dL (ref <150)
VLDL: 13 mg/dL (ref 0–40)

## 2023-04-17 LAB — HEMOGLOBIN A1C
Hgb A1c MFr Bld: 5.4 % (ref 4.8–5.6)
Mean Plasma Glucose: 108.28 mg/dL

## 2023-04-17 LAB — GLUCOSE, CAPILLARY: Glucose-Capillary: 127 mg/dL — ABNORMAL HIGH (ref 70–99)

## 2023-04-17 MED ORDER — SODIUM CHLORIDE 0.9 % IV SOLN
2.0000 g | INTRAVENOUS | Status: AC
  Administered 2023-04-17 – 2023-04-21 (×5): 2 g via INTRAVENOUS
  Filled 2023-04-17 (×5): qty 20

## 2023-04-17 MED ORDER — METOPROLOL TARTRATE 12.5 MG HALF TABLET
12.5000 mg | ORAL_TABLET | Freq: Two times a day (BID) | ORAL | Status: DC
  Administered 2023-04-17 – 2023-04-23 (×12): 12.5 mg
  Filled 2023-04-17 (×12): qty 1

## 2023-04-17 MED ORDER — NIRMATRELVIR/RITONAVIR (PAXLOVID) TABLET (RENAL DOSING): 2.0000 | ORAL_TABLET | Freq: Two times a day (BID) | ORAL | Status: DC

## 2023-04-17 MED ORDER — ADULT MULTIVITAMIN W/MINERALS CH
1.0000 | ORAL_TABLET | Freq: Every day | ORAL | Status: DC
Start: 2023-04-17 — End: 2023-04-17

## 2023-04-17 MED ORDER — METOPROLOL TARTRATE 12.5 MG HALF TABLET
12.5000 mg | ORAL_TABLET | Freq: Two times a day (BID) | ORAL | Status: DC
  Administered 2023-04-17: 12.5 mg via ORAL
  Filled 2023-04-17 (×2): qty 1

## 2023-04-17 MED ORDER — ACETAMINOPHEN 650 MG RE SUPP: 650.0000 mg | Freq: Four times a day (QID) | RECTAL | Status: DC | PRN

## 2023-04-17 MED ORDER — ACETAMINOPHEN 325 MG PO TABS
650.0000 mg | ORAL_TABLET | Freq: Four times a day (QID) | ORAL | Status: DC | PRN
  Administered 2023-04-17 – 2023-04-20 (×3): 650 mg
  Filled 2023-04-17 (×3): qty 2

## 2023-04-17 MED ORDER — LACTATED RINGERS IV SOLN: INTRAVENOUS | Status: AC

## 2023-04-17 MED ORDER — ENOXAPARIN SODIUM 30 MG/0.3ML IJ SOSY
30.0000 mg | PREFILLED_SYRINGE | INTRAMUSCULAR | Status: DC
  Administered 2023-04-18 – 2023-04-21 (×4): 30 mg via SUBCUTANEOUS
  Filled 2023-04-17 (×4): qty 0.3

## 2023-04-17 MED ORDER — DEXAMETHASONE 6 MG PO TABS
6.0000 mg | ORAL_TABLET | ORAL | Status: DC
  Administered 2023-04-17 – 2023-04-18 (×2): 6 mg
  Filled 2023-04-17 (×3): qty 1

## 2023-04-17 MED ORDER — THIAMINE MONONITRATE 100 MG PO TABS: 100.0000 mg | ORAL_TABLET | Freq: Every day | ORAL | Status: DC

## 2023-04-17 MED ORDER — TAMSULOSIN HCL 0.4 MG PO CAPS
0.4000 mg | ORAL_CAPSULE | Freq: Every day | ORAL | Status: DC
  Administered 2023-04-17: 0.4 mg via ORAL
  Filled 2023-04-17: qty 1

## 2023-04-17 MED ORDER — THIAMINE MONONITRATE 100 MG PO TABS
100.0000 mg | ORAL_TABLET | Freq: Every day | ORAL | Status: DC
  Administered 2023-04-17 – 2023-04-23 (×7): 100 mg
  Filled 2023-04-17 (×7): qty 1

## 2023-04-17 MED ORDER — ADULT MULTIVITAMIN W/MINERALS CH
1.0000 | ORAL_TABLET | Freq: Every day | ORAL | Status: DC
  Administered 2023-04-17 – 2023-04-23 (×7): 1
  Filled 2023-04-17 (×7): qty 1

## 2023-04-17 MED ORDER — DEXAMETHASONE 6 MG PO TABS
6.0000 mg | ORAL_TABLET | ORAL | Status: DC
  Filled 2023-04-17: qty 1

## 2023-04-17 MED ORDER — ACETAMINOPHEN 160 MG/5ML PO SOLN
ORAL | Status: AC
  Filled 2023-04-17: qty 20.3

## 2023-04-17 MED ORDER — FERROUS SULFATE 300 (60 FE) MG/5ML PO SOLN
300.0000 mg | ORAL | Status: DC
  Administered 2023-04-19 – 2023-04-23 (×3): 300 mg
  Filled 2023-04-17 (×3): qty 5

## 2023-04-17 MED ORDER — ACETAMINOPHEN 650 MG RE SUPP
RECTAL | Status: AC
  Filled 2023-04-17: qty 1

## 2023-04-17 NOTE — Assessment & Plan Note (Signed)
With ongoing GI symptoms -Unable to initiate Paxlovid due to low creatinine clearance -Treat symptomatically with antiemetics and Imodium

## 2023-04-17 NOTE — Progress Notes (Addendum)
Paxlovid was ordered but he can't take anything orally. There is no data to be given per tube. Ok to [employer] order per Dr. Elvera Lennox.  Hold flomax since it can also clog the tube   [name] [name], PharmD, BCIDP, AAHIVP, CPP Infectious Disease Pharmacist 04/17/2023 12:00 PM

## 2023-04-17 NOTE — Assessment & Plan Note (Addendum)
Stable -New home iron supplementation

## 2023-04-17 NOTE — Plan of Care (Signed)
  Problem: Education: Goal: Knowledge of General Education information will improve Description Including pain rating scale, medication(s)/side effects and non-pharmacologic comfort measures Outcome: Progressing   Problem: Health Behavior/Discharge Planning: Goal: Ability to manage health-related needs will improve Outcome: Progressing   

## 2023-04-17 NOTE — Progress Notes (Signed)
   04/17/23 0817  Assess: MEWS Score  Temp 99.4 F (37.4 C)  BP (!) 151/80  MAP (mmHg) 101  Pulse Rate (!) 116  ECG Heart Rate (!) 117  Resp 18  Level of Consciousness Alert  SpO2 95 %  O2 Device Room Air  Assess: MEWS Score  MEWS Temp 0  MEWS Systolic 0  MEWS Pulse 2  MEWS RR 0  MEWS LOC 0  MEWS Score 2  MEWS Score Color Yellow  Assess: if the MEWS score is Yellow or Red  Were vital signs accurate and taken at a resting state? Yes  Does the patient meet 2 or more of the SIRS criteria? No  MEWS guidelines implemented  Yes, yellow  Treat  MEWS Interventions Considered administering scheduled or prn medications/treatments as ordered  Take Vital Signs  Increase Vital Sign Frequency  Yellow: Q2hr x1, continue Q4hrs until patient remains green for 12hrs  Escalate  MEWS: Escalate Yellow: Discuss with charge nurse and consider notifying provider and/or RRT  Notify: Charge Nurse/RN  Name of Charge Nurse/RN Notified Melva  Provider Notification  Provider Name/Title Gherghe  Date Provider Notified 04/17/23  Time Provider Notified 0830  Method of Notification Page (secure chat)  Notification Reason New onset of dysrhythmia  Provider response At bedside  Date of Provider Response 04/17/23  Time of Provider Response 0839  Assess: SIRS CRITERIA  SIRS Temperature  0  SIRS Pulse 1  SIRS Respirations  0  SIRS WBC 0  SIRS Score Sum  1

## 2023-04-17 NOTE — Assessment & Plan Note (Signed)
-   SVT with aberrancy.  Cardiology suspect related to COVID.  Recommended echocardiogram.

## 2023-04-17 NOTE — Consult Note (Addendum)
Cardiology Consultation   Patient ID: [name] [name] MRN: [id number]; DOB: 1934/04/03  Admit date: 04/16/2023 Date of Consult: 04/17/2023  PCP:  Marcine Matar, MD   [facility] Providers Cardiologist: New to Dr. Bjorn Pippin  Patient Profile:   [name] [name] is a 89 y.o. male with a hx of dementia, chronic SOB, dysphagia s/p PEG placement, HTN, hypothyroidism, BPH, chronic anemia, gout, who is being seen 04/17/2023 for the evaluation of elevated troponin at the request of Dr Cyndia Bent.  History of Present Illness:   Mr. Patao with above PMH presented to ER 04/16/23 for nausea, vomiting, and diarrhea for 3 days. He had 3 falls at home due to increased weakness. He was found febrile with temp 101.8 at ED and had episode of SVT at ED.   Patient has advanced dementia, he is not able to communicate despite using interpreter.  Called the patient's son/emergency contact Mr.[name] at [phone], he states the patient lives with him. He had ongoing on GI symptoms as mentioned above and therefore was sent here. He states patient is always SOB at baseline for unknown duration of time, had visited the hospital several times for this in the past, some times he was found to have pneumonia, other times the symptom etiology was felt unclear. He has never smoked cigarettes. He is not aware if he has any lung disease diagnosed. He feels patient has a lot going on currently and agrees that patient should have time to recover from current illness. He denied any known hx of MI, CAD, CVA, CHF for the patient.   Admission diagnostic revealed AKI with creatinine 1.44 (1.13 on 04/10/2023), GFR 47, BUN 52.  High sensitive troponin 194 >189.  Lactic acid 1.8 >1.  CBC with hemoglobin 11, otherwise unremarkable.  COVID-19 positive. UA + glucose and protein. GI panel showed salmonella species. CTAP showed patchy opacities in the left lung lower lobe and minimal opacities in the right lung lower lobe, which may represent  focal pneumonitis and/or atelectasis. CTH no acute finding. CXR showed COPD.   He was admitted to medicine service for COVID-19, infectious colitis, and AKI.  He had episodes of SVT at ED. Echocardiogram completed today revealed LVEF 60 to 65%, no regional motion abnormality, concentric LVH, grade 1 DD, normal RV, trivial MR, aortic sclerosis.  Cardiology is consulted today for further evaluation of elevated troponin.     Past Medical History:  Diagnosis Date   Back pain 10/2008   per MRI 11/06/2008 - severe spinal stenosis worse at L4-5, L3-4, with neural compression at those levels   BPH (benign prostatic hyperplasia)    Eczema    Gout    Hypertension    Hypothyroidism     Past Surgical History:  Procedure Laterality Date   ESOPHAGOGASTRODUODENOSCOPY N/A 04/03/2022   Procedure: ESOPHAGOGASTRODUODENOSCOPY (EGD);  Surgeon: Jeani Hawking, MD;  Location: [facility] ENDOSCOPY;  Service: Gastroenterology;  Laterality: N/A;   LAMINECTOMY  12/11/2008    Bilateral L2, L3, and L4 laminotomies, done by Dr. Lovell Sheehan   LAPAROSCOPY N/A 04/06/2022   Procedure: LAPAROSCOPIC ASSISTED PERCUTANEOUS ENDOSCOPIC GASTROSTOMY PLACEMENT;  Surgeon: Manus Rudd, MD;  Location: [facility] OR;  Service: General;  Laterality: N/A;     Home Medications:  Prior to Admission medications   Medication Sig Start Date End Date Taking? Authorizing Provider  allopurinol (ZYLOPRIM) 100 MG tablet Take 1 tablet (100 mg total) by mouth daily. Must keep appointment in October 2024 for additional refills. 03/15/23  Yes Marcine Matar, MD  Cardiology Consultation   Patient ID: [name] [name] MRN: [id number]; DOB: 1934/04/03  Admit date: 04/16/2023 Date of Consult: 04/17/2023  PCP:  Marcine Matar, MD   [facility] Providers Cardiologist: New to Dr. Bjorn Pippin  Patient Profile:   [name] [name] is a 89 y.o. male with a hx of dementia, chronic SOB, dysphagia s/p PEG placement, HTN, hypothyroidism, BPH, chronic anemia, gout, who is being seen 04/17/2023 for the evaluation of elevated troponin at the request of Dr Cyndia Bent.  History of Present Illness:   Mr. Patao with above PMH presented to ER 04/16/23 for nausea, vomiting, and diarrhea for 3 days. He had 3 falls at home due to increased weakness. He was found febrile with temp 101.8 at ED and had episode of SVT at ED.   Patient has advanced dementia, he is not able to communicate despite using interpreter.  Called the patient's son/emergency contact Mr.[name] at [phone], he states the patient lives with him. He had ongoing on GI symptoms as mentioned above and therefore was sent here. He states patient is always SOB at baseline for unknown duration of time, had visited the hospital several times for this in the past, some times he was found to have pneumonia, other times the symptom etiology was felt unclear. He has never smoked cigarettes. He is not aware if he has any lung disease diagnosed. He feels patient has a lot going on currently and agrees that patient should have time to recover from current illness. He denied any known hx of MI, CAD, CVA, CHF for the patient.   Admission diagnostic revealed AKI with creatinine 1.44 (1.13 on 04/10/2023), GFR 47, BUN 52.  High sensitive troponin 194 >189.  Lactic acid 1.8 >1.  CBC with hemoglobin 11, otherwise unremarkable.  COVID-19 positive. UA + glucose and protein. GI panel showed salmonella species. CTAP showed patchy opacities in the left lung lower lobe and minimal opacities in the right lung lower lobe, which may represent  focal pneumonitis and/or atelectasis. CTH no acute finding. CXR showed COPD.   He was admitted to medicine service for COVID-19, infectious colitis, and AKI.  He had episodes of SVT at ED. Echocardiogram completed today revealed LVEF 60 to 65%, no regional motion abnormality, concentric LVH, grade 1 DD, normal RV, trivial MR, aortic sclerosis.  Cardiology is consulted today for further evaluation of elevated troponin.     Past Medical History:  Diagnosis Date   Back pain 10/2008   per MRI 11/06/2008 - severe spinal stenosis worse at L4-5, L3-4, with neural compression at those levels   BPH (benign prostatic hyperplasia)    Eczema    Gout    Hypertension    Hypothyroidism     Past Surgical History:  Procedure Laterality Date   ESOPHAGOGASTRODUODENOSCOPY N/A 04/03/2022   Procedure: ESOPHAGOGASTRODUODENOSCOPY (EGD);  Surgeon: Jeani Hawking, MD;  Location: [facility] ENDOSCOPY;  Service: Gastroenterology;  Laterality: N/A;   LAMINECTOMY  12/11/2008    Bilateral L2, L3, and L4 laminotomies, done by Dr. Lovell Sheehan   LAPAROSCOPY N/A 04/06/2022   Procedure: LAPAROSCOPIC ASSISTED PERCUTANEOUS ENDOSCOPIC GASTROSTOMY PLACEMENT;  Surgeon: Manus Rudd, MD;  Location: [facility] OR;  Service: General;  Laterality: N/A;     Home Medications:  Prior to Admission medications   Medication Sig Start Date End Date Taking? Authorizing Provider  allopurinol (ZYLOPRIM) 100 MG tablet Take 1 tablet (100 mg total) by mouth daily. Must keep appointment in October 2024 for additional refills. 03/15/23  Yes Marcine Matar, MD  Cardiology Consultation   Patient ID: [name] [name] MRN: [id number]; DOB: 1934/04/03  Admit date: 04/16/2023 Date of Consult: 04/17/2023  PCP:  Marcine Matar, MD   [facility] Providers Cardiologist: New to Dr. Bjorn Pippin  Patient Profile:   [name] [name] is a 89 y.o. male with a hx of dementia, chronic SOB, dysphagia s/p PEG placement, HTN, hypothyroidism, BPH, chronic anemia, gout, who is being seen 04/17/2023 for the evaluation of elevated troponin at the request of Dr Cyndia Bent.  History of Present Illness:   Mr. Patao with above PMH presented to ER 04/16/23 for nausea, vomiting, and diarrhea for 3 days. He had 3 falls at home due to increased weakness. He was found febrile with temp 101.8 at ED and had episode of SVT at ED.   Patient has advanced dementia, he is not able to communicate despite using interpreter.  Called the patient's son/emergency contact Mr.[name] at [phone], he states the patient lives with him. He had ongoing on GI symptoms as mentioned above and therefore was sent here. He states patient is always SOB at baseline for unknown duration of time, had visited the hospital several times for this in the past, some times he was found to have pneumonia, other times the symptom etiology was felt unclear. He has never smoked cigarettes. He is not aware if he has any lung disease diagnosed. He feels patient has a lot going on currently and agrees that patient should have time to recover from current illness. He denied any known hx of MI, CAD, CVA, CHF for the patient.   Admission diagnostic revealed AKI with creatinine 1.44 (1.13 on 04/10/2023), GFR 47, BUN 52.  High sensitive troponin 194 >189.  Lactic acid 1.8 >1.  CBC with hemoglobin 11, otherwise unremarkable.  COVID-19 positive. UA + glucose and protein. GI panel showed salmonella species. CTAP showed patchy opacities in the left lung lower lobe and minimal opacities in the right lung lower lobe, which may represent  focal pneumonitis and/or atelectasis. CTH no acute finding. CXR showed COPD.   He was admitted to medicine service for COVID-19, infectious colitis, and AKI.  He had episodes of SVT at ED. Echocardiogram completed today revealed LVEF 60 to 65%, no regional motion abnormality, concentric LVH, grade 1 DD, normal RV, trivial MR, aortic sclerosis.  Cardiology is consulted today for further evaluation of elevated troponin.     Past Medical History:  Diagnosis Date   Back pain 10/2008   per MRI 11/06/2008 - severe spinal stenosis worse at L4-5, L3-4, with neural compression at those levels   BPH (benign prostatic hyperplasia)    Eczema    Gout    Hypertension    Hypothyroidism     Past Surgical History:  Procedure Laterality Date   ESOPHAGOGASTRODUODENOSCOPY N/A 04/03/2022   Procedure: ESOPHAGOGASTRODUODENOSCOPY (EGD);  Surgeon: Jeani Hawking, MD;  Location: [facility] ENDOSCOPY;  Service: Gastroenterology;  Laterality: N/A;   LAMINECTOMY  12/11/2008    Bilateral L2, L3, and L4 laminotomies, done by Dr. Lovell Sheehan   LAPAROSCOPY N/A 04/06/2022   Procedure: LAPAROSCOPIC ASSISTED PERCUTANEOUS ENDOSCOPIC GASTROSTOMY PLACEMENT;  Surgeon: Manus Rudd, MD;  Location: [facility] OR;  Service: General;  Laterality: N/A;     Home Medications:  Prior to Admission medications   Medication Sig Start Date End Date Taking? Authorizing Provider  allopurinol (ZYLOPRIM) 100 MG tablet Take 1 tablet (100 mg total) by mouth daily. Must keep appointment in October 2024 for additional refills. 03/15/23  Yes Marcine Matar, MD  Cardiology Consultation   Patient ID: [name] [name] MRN: [id number]; DOB: 1934/04/03  Admit date: 04/16/2023 Date of Consult: 04/17/2023  PCP:  Marcine Matar, MD   [facility] Providers Cardiologist: New to Dr. Bjorn Pippin  Patient Profile:   [name] [name] is a 89 y.o. male with a hx of dementia, chronic SOB, dysphagia s/p PEG placement, HTN, hypothyroidism, BPH, chronic anemia, gout, who is being seen 04/17/2023 for the evaluation of elevated troponin at the request of Dr Cyndia Bent.  History of Present Illness:   Mr. Patao with above PMH presented to ER 04/16/23 for nausea, vomiting, and diarrhea for 3 days. He had 3 falls at home due to increased weakness. He was found febrile with temp 101.8 at ED and had episode of SVT at ED.   Patient has advanced dementia, he is not able to communicate despite using interpreter.  Called the patient's son/emergency contact Mr.[name] at [phone], he states the patient lives with him. He had ongoing on GI symptoms as mentioned above and therefore was sent here. He states patient is always SOB at baseline for unknown duration of time, had visited the hospital several times for this in the past, some times he was found to have pneumonia, other times the symptom etiology was felt unclear. He has never smoked cigarettes. He is not aware if he has any lung disease diagnosed. He feels patient has a lot going on currently and agrees that patient should have time to recover from current illness. He denied any known hx of MI, CAD, CVA, CHF for the patient.   Admission diagnostic revealed AKI with creatinine 1.44 (1.13 on 04/10/2023), GFR 47, BUN 52.  High sensitive troponin 194 >189.  Lactic acid 1.8 >1.  CBC with hemoglobin 11, otherwise unremarkable.  COVID-19 positive. UA + glucose and protein. GI panel showed salmonella species. CTAP showed patchy opacities in the left lung lower lobe and minimal opacities in the right lung lower lobe, which may represent  focal pneumonitis and/or atelectasis. CTH no acute finding. CXR showed COPD.   He was admitted to medicine service for COVID-19, infectious colitis, and AKI.  He had episodes of SVT at ED. Echocardiogram completed today revealed LVEF 60 to 65%, no regional motion abnormality, concentric LVH, grade 1 DD, normal RV, trivial MR, aortic sclerosis.  Cardiology is consulted today for further evaluation of elevated troponin.     Past Medical History:  Diagnosis Date   Back pain 10/2008   per MRI 11/06/2008 - severe spinal stenosis worse at L4-5, L3-4, with neural compression at those levels   BPH (benign prostatic hyperplasia)    Eczema    Gout    Hypertension    Hypothyroidism     Past Surgical History:  Procedure Laterality Date   ESOPHAGOGASTRODUODENOSCOPY N/A 04/03/2022   Procedure: ESOPHAGOGASTRODUODENOSCOPY (EGD);  Surgeon: Jeani Hawking, MD;  Location: [facility] ENDOSCOPY;  Service: Gastroenterology;  Laterality: N/A;   LAMINECTOMY  12/11/2008    Bilateral L2, L3, and L4 laminotomies, done by Dr. Lovell Sheehan   LAPAROSCOPY N/A 04/06/2022   Procedure: LAPAROSCOPIC ASSISTED PERCUTANEOUS ENDOSCOPIC GASTROSTOMY PLACEMENT;  Surgeon: Manus Rudd, MD;  Location: [facility] OR;  Service: General;  Laterality: N/A;     Home Medications:  Prior to Admission medications   Medication Sig Start Date End Date Taking? Authorizing Provider  allopurinol (ZYLOPRIM) 100 MG tablet Take 1 tablet (100 mg total) by mouth daily. Must keep appointment in October 2024 for additional refills. 03/15/23  Yes Marcine Matar, MD  7 12    Recent Labs  Lab 04/10/23 1641 04/16/23 0936  PROT 6.9 7.3  ALBUMIN 3.8 3.7  AST 22 20  ALT 14 12  ALKPHOS 84 62  BILITOT 0.3 0.5   Lipids No results for input(s): "CHOL", "TRIG", "HDL", "LABVLDL", "LDLCALC", "CHOLHDL" in the last 168 hours.  Hematology Recent Labs  Lab 04/10/23 1641 04/16/23 0936 04/17/23 1206  WBC 4.0 8.6 5.8  RBC 3.85* 3.55* 3.64*  HGB 11.6* 11.0* 11.0*  HCT 36.8* 32.8* 33.7*  MCV 96 92.4 92.6  MCH 30.1 31.0 30.2  MCHC 31.5 33.5 32.6  RDW 13.0 14.2 14.2  PLT 310 248 284   Thyroid  Recent Labs  Lab 04/17/23 1206  TSH 18.819*    BNPNo results for input(s): "BNP", "PROBNP" in the last 168 hours.  DDimer No results for input(s): "DDIMER" in the last 168 hours.   Radiology/Studies:  ECHOCARDIOGRAM COMPLETE  Result Date: 04/17/2023    ECHOCARDIOGRAM REPORT   Patient Name:   KIEN COLOMBO  Date of Exam: 04/17/2023 Medical Rec #:  [id number]  Height:       72.0 in Accession #:    [id number] Weight:       115.3 lb Date of Birth:  September 03, 1933   BSA:          1.686 m Patient Age:    89 years   BP:           96/51 mmHg Patient Gender: M          HR:           115 bpm. Exam Location:  Inpatient Procedure: 2D Echo, Color Doppler and Cardiac Doppler Indications:    R94.31 Abnormal EKG  History:        Patient has no prior history of Echocardiogram examinations.                 Risk Factors:Hypertension.  Sonographer:    Irving Burton Senior RDCS Referring Phys: [id number] CHING T TU  Sonographer Comments: Windows limited by thin body habitus. IMPRESSIONS  1. Left ventricular ejection fraction, by estimation, is 60 to 65%. The left ventricle has normal function. The left ventricle has no regional wall motion abnormalities. There is moderate concentric left ventricular hypertrophy. Left ventricular diastolic parameters are consistent with Grade I diastolic dysfunction (impaired relaxation).  2. Right ventricular systolic function is normal. The right  ventricular size is normal. Tricuspid regurgitation signal is inadequate for assessing PA pressure.  3. The mitral valve is normal in structure. Trivial mitral valve regurgitation.  4. The aortic valve is tricuspid. There is mild calcification of the aortic valve. Aortic valve regurgitation is not visualized. Aortic valve sclerosis/calcification is present, without any evidence of aortic stenosis.  5. The inferior vena cava is normal in size with greater than 50% respiratory variability, suggesting right atrial pressure of 3 mmHg. FINDINGS  Left Ventricle: Left ventricular ejection fraction, by estimation, is 60 to 65%. The left ventricle has normal function. The left ventricle has no regional wall motion abnormalities. The left ventricular internal cavity size was small. There is moderate  concentric left ventricular hypertrophy. Left ventricular diastolic parameters are consistent with Grade I diastolic dysfunction (impaired relaxation). Right Ventricle: The right ventricular size is normal. No increase in right ventricular wall thickness. Right ventricular systolic function is normal. Tricuspid regurgitation signal is inadequate for assessing PA pressure. Left Atrium: Left atrial size was normal in size. Right Atrium: Right atrial size was normal in size. Pericardium: Trivial  Cardiology Consultation   Patient ID: [name] [name] MRN: [id number]; DOB: 1934/04/03  Admit date: 04/16/2023 Date of Consult: 04/17/2023  PCP:  Marcine Matar, MD   [facility] Providers Cardiologist: New to Dr. Bjorn Pippin  Patient Profile:   [name] [name] is a 89 y.o. male with a hx of dementia, chronic SOB, dysphagia s/p PEG placement, HTN, hypothyroidism, BPH, chronic anemia, gout, who is being seen 04/17/2023 for the evaluation of elevated troponin at the request of Dr Cyndia Bent.  History of Present Illness:   Mr. Patao with above PMH presented to ER 04/16/23 for nausea, vomiting, and diarrhea for 3 days. He had 3 falls at home due to increased weakness. He was found febrile with temp 101.8 at ED and had episode of SVT at ED.   Patient has advanced dementia, he is not able to communicate despite using interpreter.  Called the patient's son/emergency contact Mr.[name] at [phone], he states the patient lives with him. He had ongoing on GI symptoms as mentioned above and therefore was sent here. He states patient is always SOB at baseline for unknown duration of time, had visited the hospital several times for this in the past, some times he was found to have pneumonia, other times the symptom etiology was felt unclear. He has never smoked cigarettes. He is not aware if he has any lung disease diagnosed. He feels patient has a lot going on currently and agrees that patient should have time to recover from current illness. He denied any known hx of MI, CAD, CVA, CHF for the patient.   Admission diagnostic revealed AKI with creatinine 1.44 (1.13 on 04/10/2023), GFR 47, BUN 52.  High sensitive troponin 194 >189.  Lactic acid 1.8 >1.  CBC with hemoglobin 11, otherwise unremarkable.  COVID-19 positive. UA + glucose and protein. GI panel showed salmonella species. CTAP showed patchy opacities in the left lung lower lobe and minimal opacities in the right lung lower lobe, which may represent  focal pneumonitis and/or atelectasis. CTH no acute finding. CXR showed COPD.   He was admitted to medicine service for COVID-19, infectious colitis, and AKI.  He had episodes of SVT at ED. Echocardiogram completed today revealed LVEF 60 to 65%, no regional motion abnormality, concentric LVH, grade 1 DD, normal RV, trivial MR, aortic sclerosis.  Cardiology is consulted today for further evaluation of elevated troponin.     Past Medical History:  Diagnosis Date   Back pain 10/2008   per MRI 11/06/2008 - severe spinal stenosis worse at L4-5, L3-4, with neural compression at those levels   BPH (benign prostatic hyperplasia)    Eczema    Gout    Hypertension    Hypothyroidism     Past Surgical History:  Procedure Laterality Date   ESOPHAGOGASTRODUODENOSCOPY N/A 04/03/2022   Procedure: ESOPHAGOGASTRODUODENOSCOPY (EGD);  Surgeon: Jeani Hawking, MD;  Location: [facility] ENDOSCOPY;  Service: Gastroenterology;  Laterality: N/A;   LAMINECTOMY  12/11/2008    Bilateral L2, L3, and L4 laminotomies, done by Dr. Lovell Sheehan   LAPAROSCOPY N/A 04/06/2022   Procedure: LAPAROSCOPIC ASSISTED PERCUTANEOUS ENDOSCOPIC GASTROSTOMY PLACEMENT;  Surgeon: Manus Rudd, MD;  Location: [facility] OR;  Service: General;  Laterality: N/A;     Home Medications:  Prior to Admission medications   Medication Sig Start Date End Date Taking? Authorizing Provider  allopurinol (ZYLOPRIM) 100 MG tablet Take 1 tablet (100 mg total) by mouth daily. Must keep appointment in October 2024 for additional refills. 03/15/23  Yes Marcine Matar, MD  Cardiology Consultation   Patient ID: [name] [name] MRN: [id number]; DOB: 1934/04/03  Admit date: 04/16/2023 Date of Consult: 04/17/2023  PCP:  Marcine Matar, MD   [facility] Providers Cardiologist: New to Dr. Bjorn Pippin  Patient Profile:   [name] [name] is a 89 y.o. male with a hx of dementia, chronic SOB, dysphagia s/p PEG placement, HTN, hypothyroidism, BPH, chronic anemia, gout, who is being seen 04/17/2023 for the evaluation of elevated troponin at the request of Dr Cyndia Bent.  History of Present Illness:   Mr. Patao with above PMH presented to ER 04/16/23 for nausea, vomiting, and diarrhea for 3 days. He had 3 falls at home due to increased weakness. He was found febrile with temp 101.8 at ED and had episode of SVT at ED.   Patient has advanced dementia, he is not able to communicate despite using interpreter.  Called the patient's son/emergency contact Mr.[name] at [phone], he states the patient lives with him. He had ongoing on GI symptoms as mentioned above and therefore was sent here. He states patient is always SOB at baseline for unknown duration of time, had visited the hospital several times for this in the past, some times he was found to have pneumonia, other times the symptom etiology was felt unclear. He has never smoked cigarettes. He is not aware if he has any lung disease diagnosed. He feels patient has a lot going on currently and agrees that patient should have time to recover from current illness. He denied any known hx of MI, CAD, CVA, CHF for the patient.   Admission diagnostic revealed AKI with creatinine 1.44 (1.13 on 04/10/2023), GFR 47, BUN 52.  High sensitive troponin 194 >189.  Lactic acid 1.8 >1.  CBC with hemoglobin 11, otherwise unremarkable.  COVID-19 positive. UA + glucose and protein. GI panel showed salmonella species. CTAP showed patchy opacities in the left lung lower lobe and minimal opacities in the right lung lower lobe, which may represent  focal pneumonitis and/or atelectasis. CTH no acute finding. CXR showed COPD.   He was admitted to medicine service for COVID-19, infectious colitis, and AKI.  He had episodes of SVT at ED. Echocardiogram completed today revealed LVEF 60 to 65%, no regional motion abnormality, concentric LVH, grade 1 DD, normal RV, trivial MR, aortic sclerosis.  Cardiology is consulted today for further evaluation of elevated troponin.     Past Medical History:  Diagnosis Date   Back pain 10/2008   per MRI 11/06/2008 - severe spinal stenosis worse at L4-5, L3-4, with neural compression at those levels   BPH (benign prostatic hyperplasia)    Eczema    Gout    Hypertension    Hypothyroidism     Past Surgical History:  Procedure Laterality Date   ESOPHAGOGASTRODUODENOSCOPY N/A 04/03/2022   Procedure: ESOPHAGOGASTRODUODENOSCOPY (EGD);  Surgeon: Jeani Hawking, MD;  Location: [facility] ENDOSCOPY;  Service: Gastroenterology;  Laterality: N/A;   LAMINECTOMY  12/11/2008    Bilateral L2, L3, and L4 laminotomies, done by Dr. Lovell Sheehan   LAPAROSCOPY N/A 04/06/2022   Procedure: LAPAROSCOPIC ASSISTED PERCUTANEOUS ENDOSCOPIC GASTROSTOMY PLACEMENT;  Surgeon: Manus Rudd, MD;  Location: [facility] OR;  Service: General;  Laterality: N/A;     Home Medications:  Prior to Admission medications   Medication Sig Start Date End Date Taking? Authorizing Provider  allopurinol (ZYLOPRIM) 100 MG tablet Take 1 tablet (100 mg total) by mouth daily. Must keep appointment in October 2024 for additional refills. 03/15/23  Yes Marcine Matar, MD

## 2023-04-17 NOTE — Plan of Care (Signed)

## 2023-04-17 NOTE — Assessment & Plan Note (Signed)
-  Troponin of 194-189. Pt also had brief episode of SVT with aberrancy in the ED.  Curbside with cardiology on-call cough fellow.  Elevated troponin likely demand from AKI and COVID.  Will obtain echocardiogram in the morning and can consult cardiology as needed.

## 2023-04-17 NOTE — Progress Notes (Signed)
PROGRESS NOTE  [name] [name] GNF:[id number] DOB: 1933/09/14 DOA: 04/16/2023 PCP: Marcine Matar, MD   LOS: 0 days   Brief Narrative / Interim history: 89 year old male with history of dementia, dysphagia status post PEG tube, HTN, hypothyroidism, BPH, anemia who comes into the hospital with nausea, vomiting, diarrhea.  This has been going on for the past 3 days, and has been weaker and had increased falls.  He was febrile in the ER 101.8, was found to be COVID-positive.  He had an episode of SVT while in the ED that lasted about 5 minutes.  Cardiology was consulted  Subjective / 24h Interval events: Has underlying dementia, appears comfortable on my evaluation, slightly agitated  Assesement and Plan: Principal Problem:   COVID-19 virus infection Active Problems:   Hypothyroidism   Anemia, chronic disease   Essential hypertension   Dysphagia   Elevated troponin   AKI (acute kidney injury) (HCC)   Intractable nausea and vomiting   SVT (supraventricular tachycardia) (HCC)   PEG (percutaneous endoscopic gastrostomy) status (HCC)  Principal problem COVID-19-patient's symptoms have been going on for the past 3 days.  Start Paxlovid /steroids  Active problems SVT-cardiology consulted, appreciate input.  I started him on metoprolol this morning.  2D echo pending  Nausea, vomiting, diarrhea -vomited tube feeds today.  At risk for aspiration.  C. difficile and GI pathogen panel pending  AKI-monitor, on fluids  Elevated troponin-likely in the setting of SVT  Dysphagia-status post PEG tube.  History of aspiration  Anemia of chronic disease-stable  Hypothyroidism-recent TSH of 39, PCP has increased his levothyroxine from 50-75 last week.  Underlying dementia -seems to have a poor baseline, on feeding tubes, advanced dementia.  Palliative consulted  History of BPH-required In-N-Out cath last night.  Start Flomax  Scheduled Meds:  allopurinol  50 mg Per Tube QODAY   enoxaparin  (LOVENOX) injection  40 mg Subcutaneous Q24H   feeding supplement (JEVITY 1.2 CAL)  237 mL Per Tube Q4H   ferrous sulfate  325 mg Oral QODAY   levothyroxine  75 mcg Per Tube QAC breakfast   metoprolol tartrate  12.5 mg Oral BID   tamsulosin  0.4 mg Oral Daily   Continuous Infusions: PRN Meds:.acetaminophen, loperamide HCl, ondansetron (ZOFRAN) IV  Current Outpatient Medications  Medication Instructions   allopurinol (ZYLOPRIM) 100 mg, Oral, Daily, Must keep appointment in October 2024 for additional refills.   aspirin EC 81 mg, Oral, Daily, Swallow whole.   bisoprolol (ZEBETA) 5 MG tablet Take 1 tablet (5 mg total) by mouth once daily.   cetirizine (ZYRTEC) 10 mg, Oral, Daily, Must have office visit for refills   Emollient (EUCERIN INTENSIVE REPAIR) OINT 1 Units, Apply externally, 2 times daily, To dry skin twice daily.   Iron, Ferrous Sulfate, 325 (65 Fe) MG TABS Take 1 tablet (325 mg) by mouth every other day. Please make an appointment with Dr. Laural Benes for more refills.   levothyroxine (SYNTHROID) 75 mcg, Oral, Daily before breakfast   triamcinolone cream (KENALOG) 0.1 % Apply 1 application topically to the affected area(s) 2 (two) times daily.   Water For Irrigation, Sterile (FREE WATER) SOLN 50 mL free water flush via PEG tube before and after each bolus    Diet Orders (From admission, onward)     Start     Ordered   04/16/23 2307  Diet NPO time specified  Diet effective now        04/16/23 2307  DVT prophylaxis: enoxaparin (LOVENOX) injection 40 mg Start: 04/17/23 1000   Lab Results  Component Value Date   PLT 248 04/16/2023      Code Status: Full Code  Family Communication: no family at bedside   Status is: Observation The patient will require care spanning > 2 midnights and should be moved to inpatient because: febrile, aspiration, SVT all ongoing   Level of care: Progressive  Consultants:  Cardiology   Objective: Vitals:   04/17/23 0500  04/17/23 0817 04/17/23 1000 04/17/23 1117  BP: 106/87 (!) 151/80 130/87 (!) 144/67  Pulse: 95 (!) 116 63   Resp: 19 18 19 18   Temp: 99 F (37.2 C) 99.4 F (37.4 C)  (!) 102.6 F (39.2 C)  TempSrc: Oral Axillary  Axillary  SpO2: 100% 95% 99%   Weight:      Height:        Intake/Output Summary (Last 24 hours) at 04/17/2023 1120 Last data filed at 04/17/2023 1001 Gross per 24 hour  Intake 2199.86 ml  Output 6700 ml  Net -4500.14 ml   Wt Readings from Last 3 Encounters:  04/16/23 52.3 kg  04/10/23 54.1 kg  04/08/22 61.7 kg    Examination:  Constitutional: NAD Eyes: no scleral icterus ENMT: Mucous membranes are moist.  Neck: normal, supple Respiratory: Faint rhonchi, no wheezing Cardiovascular: Regular rate and rhythm, no murmurs / rubs / gallops.  Abdomen: non distended, no tenderness. Bowel sounds positive.  Musculoskeletal: no clubbing / cyanosis.   Data Reviewed: I have independently reviewed following labs and imaging studies   CBC Recent Labs  Lab 04/10/23 1641 04/16/23 0936  WBC 4.0 8.6  HGB 11.6* 11.0*  HCT 36.8* 32.8*  PLT 310 248  MCV 96 92.4  MCH 30.1 31.0  MCHC 31.5 33.5  RDW 13.0 14.2    Recent Labs  Lab 04/10/23 1641 04/14/23 1557 04/16/23 0936 04/16/23 1026 04/16/23 1320 04/17/23 0256  NA 139  --  136  --   --  138  K 5.6* 5.0 5.0  --   --  5.1  CL 102  --  101  --   --  102  CO2 23  --  28  --   --  24  GLUCOSE 95  --  137*  --   --  107*  BUN 46*  --  52*  --   --  53*  CREATININE 1.13  --  1.44*  --   --  1.48*  CALCIUM 10.0  --  10.6*  --   --  10.0  AST 22  --  20  --   --   --   ALT 14  --  12  --   --   --   ALKPHOS 84  --  62  --   --   --   BILITOT 0.3  --  0.5  --   --   --   ALBUMIN 3.8  --  3.7  --   --   --   LATICACIDVEN  --   --  1.8  --  1.0  --   TSH 39.700*  --   --   --   --   --   AMMONIA  --   --   --  20  --   --      ------------------------------------------------------------------------------------------------------------------ No results for input(s): "CHOL", "HDL", "LDLCALC", "TRIG", "CHOLHDL", "LDLDIRECT" in the last 72 hours.  No results found for: "HGBA1C" ------------------------------------------------------------------------------------------------------------------ No  results for input(s): "TSH", "T4TOTAL", "T3FREE", "THYROIDAB" in the last 72 hours.  Invalid input(s): "FREET3"  Cardiac Enzymes No results for input(s): "CKMB", "TROPONINI", "MYOGLOBIN" in the last 168 hours.  Invalid input(s): "CK" ------------------------------------------------------------------------------------------------------------------    Component Value Date/Time   BNP 61.6 12/10/2021 1749    CBG: No results for input(s): "GLUCAP" in the last 168 hours.  Recent Results (from the past 240 hour(s))  Blood culture (routine x 2)     Status: None (Preliminary result)   Collection Time: 04/16/23  9:36 AM   Specimen: BLOOD  Result Value Ref Range Status   Specimen Description   Final    BLOOD RIGHT ANTECUBITAL Performed at [facility], [address] [place], [place] [address]    Special Requests   Final    BOTTLES DRAWN AEROBIC AND ANAEROBIC Blood Culture adequate volume Performed at [facility], [address] [place], [place] [address]    Culture   Final    NO GROWTH < 12 HOURS Performed at [facility], [address], [place], [place] [address]    Report Status PENDING  Incomplete  Blood culture (routine x 2)     Status: None (Preliminary result)   Collection Time: 04/16/23 10:00 AM   Specimen: BLOOD RIGHT ARM  Result Value Ref Range Status   Specimen Description   Final    BLOOD RIGHT ARM Performed at [facility], [address], [place], [place] [address]    Special Requests   Final    BOTTLES DRAWN AEROBIC ONLY Blood Culture  adequate volume Performed at [facility], [address], [place], [place] [address]    Culture   Final    NO GROWTH < 12 HOURS Performed at [facility], [address], [place], [place] [address]    Report Status PENDING  Incomplete  Resp panel by RT-PCR (RSV, Flu A&B, Covid) Anterior Nasal Swab     Status: Abnormal   Collection Time: 04/16/23  2:59 PM   Specimen: Anterior Nasal Swab  Result Value Ref Range Status   SARS Coronavirus 2 by RT PCR POSITIVE (A) NEGATIVE Final    Comment: (NOTE) SARS-CoV-2 target nucleic acids are DETECTED.  The SARS-CoV-2 RNA is generally detectable in upper respiratory specimens during the acute phase of infection. Positive results are indicative of the presence of the identified virus, but do not rule out bacterial infection or co-infection with other pathogens not detected by the test. Clinical correlation with patient history and other diagnostic information is necessary to determine patient infection status. The expected result is Negative.  Fact Sheet for Patients: BloggerCourse.com  Fact Sheet for Healthcare Providers: SeriousBroker.it  This test is not yet approved or cleared by the [place] FDA and  has been authorized for detection and/or diagnosis of SARS-CoV-2 by FDA under an Emergency Use Authorization (EUA).  This EUA will remain in effect (meaning this test can be used) for the duration of  the COVID-19 declaration under Section 564(b)(1) of the A ct, 21 U.S.C. section 360bbb-3(b)(1), unless the authorization is terminated or revoked sooner.     Influenza A by PCR NEGATIVE NEGATIVE Final   Influenza B by PCR NEGATIVE NEGATIVE Final    Comment: (NOTE) The Xpert Xpress SARS-CoV-2/FLU/RSV plus assay is intended as an aid in the diagnosis of influenza from Nasopharyngeal swab specimens and should not be used as a sole basis for treatment. Nasal  washings and aspirates are unacceptable for Xpert  Xpress SARS-CoV-2/FLU/RSV testing.  Fact Sheet for Patients: BloggerCourse.com  Fact Sheet for Healthcare Providers: SeriousBroker.it  This test is not yet approved or cleared by the [place] FDA and has been authorized for detection and/or diagnosis of SARS-CoV-2 by FDA under an Emergency Use Authorization (EUA). This EUA will remain in effect (meaning this test can be used) for the duration of the COVID-19 declaration under Section 564(b)(1) of the Act, 21 U.S.C. section 360bbb-3(b)(1), unless the authorization is terminated or revoked.     Resp Syncytial Virus by PCR NEGATIVE NEGATIVE Final    Comment: (NOTE) Fact Sheet for Patients: BloggerCourse.com  Fact Sheet for Healthcare Providers: SeriousBroker.it  This test is not yet approved or cleared by the [place] FDA and has been authorized for detection and/or diagnosis of SARS-CoV-2 by FDA under an Emergency Use Authorization (EUA). This EUA will remain in effect (meaning this test can be used) for the duration of the COVID-19 declaration under Section 564(b)(1) of the Act, 21 U.S.C. section 360bbb-3(b)(1), unless the authorization is terminated or revoked.  Performed at [facility], [address] [place], [place] [address]      Radiology Studies: DG Chest Portable 1 View  Result Date: 04/16/2023 CLINICAL DATA:  Vomiting, and diarrhea, and weakness. Altered mental status. EXAM: PORTABLE CHEST 1 VIEW COMPARISON:  04/01/2022 FINDINGS: The heart size and mediastinal contours are within normal limits. Marked pulmonary hyperinflation is seen, consistent with COPD. Both lungs are clear. Old left humeral neck fracture is again noted. IMPRESSION: COPD. No active disease. Electronically Signed   By: Danae Orleans M.D.   On: 04/16/2023 13:30   CT  ABDOMEN PELVIS W CONTRAST  Result Date: 04/16/2023 CLINICAL DATA:  Diarrhea EXAM: CT ABDOMEN AND PELVIS WITH CONTRAST TECHNIQUE: Multidetector CT imaging of the abdomen and pelvis was performed using the standard protocol following bolus administration of intravenous contrast. RADIATION DOSE REDUCTION: This exam was performed according to the departmental dose-optimization program which includes automated exposure control, adjustment of the mA and/or kV according to patient size and/or use of iterative reconstruction technique. CONTRAST:  OMNIPAQUE IOHEXOL 300 MG/ML  SOLN COMPARISON:  CT scan abdomen from 04/05/2022. FINDINGS: Examination is moderately limited due to patient's motion during data acquisition. Lower chest: There are patchy opacities in the left lung lower lobe and minimal opacities in the right lung lower lobe, which may represent focal pneumonitis and/or atelectasis. Correlate clinically. The lung bases are otherwise clear. No pleural effusion. The heart is normal in size. No pericardial effusion. Hepatobiliary: The liver is normal in size. Non-cirrhotic configuration. No suspicious mass. No intrahepatic or extrahepatic bile duct dilation. Limited evaluation of gallbladder due to extensive motion. Pancreas: Unremarkable. No pancreatic ductal dilatation or surrounding inflammatory changes. Spleen: Within normal limits. No focal lesion. Adrenals/Urinary Tract: Adrenal glands are unremarkable. Markedly limited evaluation of bilateral kidneys due to extensive motion. There scattered subcentimeter hypoattenuating foci, too small to adequately characterize. No hydronephrosis seen. No nephroureterolithiasis. Unremarkable urinary bladder. Stomach/Bowel: Left upper quadrant gastrostomy noted. No disproportionate dilation of the small or large bowel loops. No evidence of abnormal bowel wall thickening or inflammatory changes. The appendix was not visualized; however there is no acute inflammatory  process in the right lower quadrant. Vascular/Lymphatic: No ascites or pneumoperitoneum. No abdominal or pelvic lymphadenopathy, by size criteria. No aneurysmal dilation of the major abdominal arteries. There are moderate peripheral atherosclerotic vascular calcifications of the aorta and its major branches. Reproductive: Enlarged prostate with median lobe projecting into the bladder  base. Other: The visualized soft tissues and abdominal wall are unremarkable. Musculoskeletal: No suspicious osseous lesions. There are moderate - marked multilevel degenerative changes in the visualized spine. L4-5 posterior spinal fixation noted. IMPRESSION: 1. Examination is moderately limited due to patient's motion during data acquisition. 2. No acute findings in the abdomen or pelvis. 3. Patchy opacities in the left lung lower lobe and minimal opacities in the right lung lower lobe, which may represent focal pneumonitis and/or atelectasis. Correlate clinically. 4. Enlarged prostate with median lobe projecting into the bladder base. 5. Multiple other nonacute observations, as described above. Aortic Atherosclerosis (ICD10-I70.0). Electronically Signed   By: Jules Schick M.D.   On: 04/16/2023 12:05   CT Head Wo Contrast  Result Date: 04/16/2023 CLINICAL DATA:  Altered mental status, nontraumatic. Vomiting and nausea for 3 days EXAM: CT HEAD WITHOUT CONTRAST TECHNIQUE: Contiguous axial images were obtained from the base of the skull through the vertex without intravenous contrast. RADIATION DOSE REDUCTION: This exam was performed according to the departmental dose-optimization program which includes automated exposure control, adjustment of the mA and/or kV according to patient size and/or use of iterative reconstruction technique. COMPARISON:  04/21/2022 FINDINGS: Brain: No evidence of acute infarction, hemorrhage, hydrocephalus, extra-axial collection or mass lesion/mass effect. Generalized atrophy with chronic small vessel  ischemia. Vascular: No hyperdense vessel or unexpected calcification. Skull: Normal. Negative for fracture or focal lesion. Sinuses/Orbits: No acute finding. Other: Mild motion artifact but overall diagnostic scan. IMPRESSION: Involuting brain without acute or interval finding. Electronically Signed   By: Tiburcio Pea M.D.   On: 04/16/2023 11:52     Pamella Pert, MD, PhD [facility]  Between 7 am - 7 pm I am available, please contact me via Amion (for emergencies) or Securechat (non urgent messages)  Between 7 pm - 7 am I am not available, please contact night coverage MD/APP via Amion

## 2023-04-17 NOTE — Progress Notes (Signed)
Pt had run of SVT in 160s - MD notified.  Will continue to monitor.

## 2023-04-17 NOTE — Progress Notes (Signed)
   04/17/23 1117  Assess: MEWS Score  Temp (!) 102.6 F (39.2 C)  BP (!) 144/67  MAP (mmHg) 88  ECG Heart Rate (!) 129  Resp 18  Level of Consciousness Alert  SpO2 99 %  O2 Device Room Air  Assess: MEWS Score  MEWS Temp 2  MEWS Systolic 0  MEWS Pulse 2  MEWS RR 0  MEWS LOC 0  MEWS Score 4  MEWS Score Color Red  Assess: if the MEWS score is Yellow or Red  Were vital signs accurate and taken at a resting state? Yes  Does the patient meet 2 or more of the SIRS criteria? Yes  Does the patient have a confirmed or suspected source of infection? Yes  MEWS guidelines implemented  Yes, red  Treat  MEWS Interventions Considered administering scheduled or prn medications/treatments as ordered  Take Vital Signs  Increase Vital Sign Frequency  Red: Q1hr x2, continue Q4hrs until patient remains green for 12hrs  Escalate  MEWS: Escalate Red: Discuss with charge nurse and notify provider. Consider notifying RRT. If remains red for 2 hours consider need for higher level of care  Notify: Charge Nurse/RN  Name of Charge Nurse/RN Notified Melva  Provider Notification  Provider Name/Title Gherghe  Date Provider Notified 04/17/23  Time Provider Notified 1123  Method of Notification Page (secure chat)  Notification Reason Change in status  Provider response See new orders  Date of Provider Response 04/17/23  Time of Provider Response 1130  Notify: Rapid Response  Name of Rapid Response RN Notified Hannah  Date Rapid Response Notified 04/17/23  Time Rapid Response Notified 1125  Assess: SIRS CRITERIA  SIRS Temperature  1  SIRS Pulse 1  SIRS Respirations  0  SIRS WBC 0  SIRS Score Sum  2

## 2023-04-17 NOTE — Assessment & Plan Note (Addendum)
S/p PEG tube -Keep n.p.o. patient aspirates when he takes anything orally -continue daily Jevity feeds 6x daily

## 2023-04-17 NOTE — Assessment & Plan Note (Addendum)
-   Creatinine elevated at 1.44 from prior of 1.13 - Keep on continuous IV fluids overnight -renally dose all medications

## 2023-04-17 NOTE — Progress Notes (Signed)
Pt temperature remain elevated.  Ice packs placed under patients arms.  MD notified.  Will continue to monitor.

## 2023-04-17 NOTE — Progress Notes (Signed)
Echocardiogram 2D Echocardiogram has been performed.  Warren Lacy Lindey Renzulli RDCS 04/17/2023, 2:37 PM

## 2023-04-17 NOTE — Assessment & Plan Note (Signed)
-  not on antihypertensives at home

## 2023-04-17 NOTE — Plan of Care (Signed)
  Problem: Clinical Measurements: Goal: Diagnostic test results will improve Outcome: Progressing   Problem: Nutrition: Goal: Adequate nutrition will be maintained Outcome: Progressing   

## 2023-04-18 ENCOUNTER — Encounter (HOSPITAL_COMMUNITY): Payer: Self-pay | Admitting: Internal Medicine

## 2023-04-18 LAB — CBC
HCT: 29.5 % — ABNORMAL LOW (ref 39.0–52.0)
Hemoglobin: 9.6 g/dL — ABNORMAL LOW (ref 13.0–17.0)
MCH: 30.4 pg (ref 26.0–34.0)
MCHC: 32.5 g/dL (ref 30.0–36.0)
MCV: 93.4 fL (ref 80.0–100.0)
Platelets: 221 10*3/uL (ref 150–400)
RBC: 3.16 MIL/uL — ABNORMAL LOW (ref 4.22–5.81)
RDW: 14.2 % (ref 11.5–15.5)
WBC: 5.5 10*3/uL (ref 4.0–10.5)
nRBC: 0 % (ref 0.0–0.2)

## 2023-04-18 LAB — COMPREHENSIVE METABOLIC PANEL
ALT: 20 U/L (ref 0–44)
AST: 29 U/L (ref 15–41)
Albumin: 2.4 g/dL — ABNORMAL LOW (ref 3.5–5.0)
Alkaline Phosphatase: 50 U/L (ref 38–126)
Anion gap: 10 (ref 5–15)
BUN: 62 mg/dL — ABNORMAL HIGH (ref 8–23)
CO2: 24 mmol/L (ref 22–32)
Calcium: 9.9 mg/dL (ref 8.9–10.3)
Chloride: 107 mmol/L (ref 98–111)
Creatinine, Ser: 1.9 mg/dL — ABNORMAL HIGH (ref 0.61–1.24)
GFR, Estimated: 34 mL/min — ABNORMAL LOW (ref >60)
Glucose, Bld: 123 mg/dL — ABNORMAL HIGH (ref 70–99)
Potassium: 4.3 mmol/L (ref 3.5–5.1)
Sodium: 141 mmol/L (ref 135–145)
Total Bilirubin: 0.5 mg/dL (ref 0.3–1.2)
Total Protein: 6.4 g/dL — ABNORMAL LOW (ref 6.5–8.1)

## 2023-04-18 LAB — GLUCOSE, CAPILLARY
Glucose-Capillary: 128 mg/dL — ABNORMAL HIGH (ref 70–99)
Glucose-Capillary: 113 mg/dL — ABNORMAL HIGH (ref 70–99)
Glucose-Capillary: 108 mg/dL — ABNORMAL HIGH (ref 70–99)
Glucose-Capillary: 136 mg/dL — ABNORMAL HIGH (ref 70–99)
Glucose-Capillary: 124 mg/dL — ABNORMAL HIGH (ref 70–99)

## 2023-04-18 LAB — MAGNESIUM: Magnesium: 2.3 mg/dL (ref 1.7–2.4)

## 2023-04-18 MED ORDER — FREE WATER
100.0000 mL | Status: DC
  Administered 2023-04-18 – 2023-04-20 (×11): 100 mL

## 2023-04-18 MED ORDER — CHLORHEXIDINE GLUCONATE CLOTH 2 % EX PADS
6.0000 | MEDICATED_PAD | Freq: Every day | CUTANEOUS | Status: DC
Start: 2023-04-18 — End: 2023-04-23
  Administered 2023-04-18 – 2023-04-23 (×6): 6 via TOPICAL

## 2023-04-18 MED ORDER — JEVITY 1.5 CAL/FIBER PO LIQD
1000.0000 mL | ORAL | Status: DC
  Administered 2023-04-18 – 2023-04-22 (×6): 1000 mL
  Filled 2023-04-18 (×8): qty 1000

## 2023-04-18 MED ORDER — BANATROL TF EN LIQD
60.0000 mL | Freq: Two times a day (BID) | ENTERAL | Status: DC
Start: 2023-04-18 — End: 2023-04-23
  Administered 2023-04-18 – 2023-04-23 (×10): 60 mL
  Filled 2023-04-18 (×10): qty 60

## 2023-04-18 MED ORDER — PROSOURCE TF20 ENFIT COMPATIBL EN LIQD
60.0000 mL | Freq: Every day | ENTERAL | Status: DC
Start: 2023-04-18 — End: 2023-04-23
  Administered 2023-04-18 – 2023-04-23 (×6): 60 mL
  Filled 2023-04-18 (×6): qty 60

## 2023-04-18 MED ORDER — LACTATED RINGERS IV SOLN: INTRAVENOUS | Status: AC

## 2023-04-18 NOTE — Plan of Care (Signed)
  Problem: Education: Goal: Knowledge of General Education information will improve Description: Including pain rating scale, medication(s)/side effects and non-pharmacologic comfort measures Outcome: Not Progressing   

## 2023-04-18 NOTE — Progress Notes (Signed)
Final   Shigella/Enteroinvasive E coli (EIEC) NOT DETECTED NOT DETECTED Final   Cryptosporidium NOT DETECTED NOT DETECTED Final   Cyclospora cayetanensis NOT DETECTED NOT DETECTED Final   Entamoeba histolytica NOT DETECTED NOT DETECTED Final   Giardia lamblia NOT DETECTED NOT DETECTED Final   Adenovirus F40/41 NOT DETECTED NOT DETECTED Final   Astrovirus NOT DETECTED NOT DETECTED Final   Norovirus GI/GII NOT DETECTED NOT DETECTED Final   Rotavirus A NOT DETECTED NOT DETECTED Final   Sapovirus (I, II, IV, and V) NOT DETECTED NOT DETECTED Final    Comment: Performed at [facility], [address] [place], [place] [address]  C Difficile Quick Screen w PCR reflex     Status: None   Collection Time: 04/16/23  8:26 PM   Specimen: Stool  Result Value Ref Range Status   C Diff antigen NEGATIVE NEGATIVE Final   C Diff toxin NEGATIVE NEGATIVE Final   C Diff interpretation No C. difficile detected.  Final     Comment: Performed at [facility], [address] [place], [place] [address]     Radiology Studies: ECHOCARDIOGRAM COMPLETE  Result Date: 04/17/2023    ECHOCARDIOGRAM REPORT   Patient Name:   [name] [name]  Date of Exam: 04/17/2023 Medical Rec #:  [id number]  Height:       72.0 in Accession #:    [id number] Weight:       115.3 lb Date of Birth:  Jul 01, 1933   BSA:          1.686 m Patient Age:    89 years   BP:           96/51 mmHg Patient Gender: M          HR:           115 bpm. Exam Location:  Inpatient Procedure: 2D Echo, Color Doppler and Cardiac Doppler Indications:    R94.31 Abnormal EKG  History:        Patient has no prior history of Echocardiogram examinations.                 Risk Factors:Hypertension.  Sonographer:    Irving Burton Senior RDCS Referring Phys: [id number] CHING T TU  Sonographer Comments: Windows limited by thin body habitus. IMPRESSIONS  1. Left ventricular ejection fraction, by estimation, is 60 to 65%. The left ventricle has normal function. The left ventricle has no regional wall motion abnormalities. There is moderate concentric left ventricular hypertrophy. Left ventricular diastolic parameters are consistent with Grade I diastolic dysfunction (impaired relaxation).  2. Right ventricular systolic function is normal. The right ventricular size is normal. Tricuspid regurgitation signal is inadequate for assessing PA pressure.  3. The mitral valve is normal in structure. Trivial mitral valve regurgitation.  4. The aortic valve is tricuspid. There is mild calcification of the aortic valve. Aortic valve regurgitation is not visualized. Aortic valve sclerosis/calcification is present, without any evidence of aortic stenosis.  5. The inferior vena cava is normal in size with greater than 50% respiratory variability, suggesting right atrial pressure of 3 mmHg. FINDINGS  Left Ventricle: Left ventricular ejection fraction, by estimation, is 60 to 65%. The left ventricle has normal function.  The left ventricle has no regional wall motion abnormalities. The left ventricular internal cavity size was small. There is moderate  concentric left ventricular hypertrophy. Left ventricular diastolic parameters are consistent with Grade I diastolic dysfunction (impaired relaxation). Right Ventricle: The right ventricular size is normal. No increase in  Final   Shigella/Enteroinvasive E coli (EIEC) NOT DETECTED NOT DETECTED Final   Cryptosporidium NOT DETECTED NOT DETECTED Final   Cyclospora cayetanensis NOT DETECTED NOT DETECTED Final   Entamoeba histolytica NOT DETECTED NOT DETECTED Final   Giardia lamblia NOT DETECTED NOT DETECTED Final   Adenovirus F40/41 NOT DETECTED NOT DETECTED Final   Astrovirus NOT DETECTED NOT DETECTED Final   Norovirus GI/GII NOT DETECTED NOT DETECTED Final   Rotavirus A NOT DETECTED NOT DETECTED Final   Sapovirus (I, II, IV, and V) NOT DETECTED NOT DETECTED Final    Comment: Performed at [facility], [address] [place], [place] [address]  C Difficile Quick Screen w PCR reflex     Status: None   Collection Time: 04/16/23  8:26 PM   Specimen: Stool  Result Value Ref Range Status   C Diff antigen NEGATIVE NEGATIVE Final   C Diff toxin NEGATIVE NEGATIVE Final   C Diff interpretation No C. difficile detected.  Final     Comment: Performed at [facility], [address] [place], [place] [address]     Radiology Studies: ECHOCARDIOGRAM COMPLETE  Result Date: 04/17/2023    ECHOCARDIOGRAM REPORT   Patient Name:   [name] [name]  Date of Exam: 04/17/2023 Medical Rec #:  [id number]  Height:       72.0 in Accession #:    [id number] Weight:       115.3 lb Date of Birth:  Jul 01, 1933   BSA:          1.686 m Patient Age:    89 years   BP:           96/51 mmHg Patient Gender: M          HR:           115 bpm. Exam Location:  Inpatient Procedure: 2D Echo, Color Doppler and Cardiac Doppler Indications:    R94.31 Abnormal EKG  History:        Patient has no prior history of Echocardiogram examinations.                 Risk Factors:Hypertension.  Sonographer:    Irving Burton Senior RDCS Referring Phys: [id number] CHING T TU  Sonographer Comments: Windows limited by thin body habitus. IMPRESSIONS  1. Left ventricular ejection fraction, by estimation, is 60 to 65%. The left ventricle has normal function. The left ventricle has no regional wall motion abnormalities. There is moderate concentric left ventricular hypertrophy. Left ventricular diastolic parameters are consistent with Grade I diastolic dysfunction (impaired relaxation).  2. Right ventricular systolic function is normal. The right ventricular size is normal. Tricuspid regurgitation signal is inadequate for assessing PA pressure.  3. The mitral valve is normal in structure. Trivial mitral valve regurgitation.  4. The aortic valve is tricuspid. There is mild calcification of the aortic valve. Aortic valve regurgitation is not visualized. Aortic valve sclerosis/calcification is present, without any evidence of aortic stenosis.  5. The inferior vena cava is normal in size with greater than 50% respiratory variability, suggesting right atrial pressure of 3 mmHg. FINDINGS  Left Ventricle: Left ventricular ejection fraction, by estimation, is 60 to 65%. The left ventricle has normal function.  The left ventricle has no regional wall motion abnormalities. The left ventricular internal cavity size was small. There is moderate  concentric left ventricular hypertrophy. Left ventricular diastolic parameters are consistent with Grade I diastolic dysfunction (impaired relaxation). Right Ventricle: The right ventricular size is normal. No increase in  Final   Shigella/Enteroinvasive E coli (EIEC) NOT DETECTED NOT DETECTED Final   Cryptosporidium NOT DETECTED NOT DETECTED Final   Cyclospora cayetanensis NOT DETECTED NOT DETECTED Final   Entamoeba histolytica NOT DETECTED NOT DETECTED Final   Giardia lamblia NOT DETECTED NOT DETECTED Final   Adenovirus F40/41 NOT DETECTED NOT DETECTED Final   Astrovirus NOT DETECTED NOT DETECTED Final   Norovirus GI/GII NOT DETECTED NOT DETECTED Final   Rotavirus A NOT DETECTED NOT DETECTED Final   Sapovirus (I, II, IV, and V) NOT DETECTED NOT DETECTED Final    Comment: Performed at [facility], [address] [place], [place] [address]  C Difficile Quick Screen w PCR reflex     Status: None   Collection Time: 04/16/23  8:26 PM   Specimen: Stool  Result Value Ref Range Status   C Diff antigen NEGATIVE NEGATIVE Final   C Diff toxin NEGATIVE NEGATIVE Final   C Diff interpretation No C. difficile detected.  Final     Comment: Performed at [facility], [address] [place], [place] [address]     Radiology Studies: ECHOCARDIOGRAM COMPLETE  Result Date: 04/17/2023    ECHOCARDIOGRAM REPORT   Patient Name:   [name] [name]  Date of Exam: 04/17/2023 Medical Rec #:  [id number]  Height:       72.0 in Accession #:    [id number] Weight:       115.3 lb Date of Birth:  Jul 01, 1933   BSA:          1.686 m Patient Age:    89 years   BP:           96/51 mmHg Patient Gender: M          HR:           115 bpm. Exam Location:  Inpatient Procedure: 2D Echo, Color Doppler and Cardiac Doppler Indications:    R94.31 Abnormal EKG  History:        Patient has no prior history of Echocardiogram examinations.                 Risk Factors:Hypertension.  Sonographer:    Irving Burton Senior RDCS Referring Phys: [id number] CHING T TU  Sonographer Comments: Windows limited by thin body habitus. IMPRESSIONS  1. Left ventricular ejection fraction, by estimation, is 60 to 65%. The left ventricle has normal function. The left ventricle has no regional wall motion abnormalities. There is moderate concentric left ventricular hypertrophy. Left ventricular diastolic parameters are consistent with Grade I diastolic dysfunction (impaired relaxation).  2. Right ventricular systolic function is normal. The right ventricular size is normal. Tricuspid regurgitation signal is inadequate for assessing PA pressure.  3. The mitral valve is normal in structure. Trivial mitral valve regurgitation.  4. The aortic valve is tricuspid. There is mild calcification of the aortic valve. Aortic valve regurgitation is not visualized. Aortic valve sclerosis/calcification is present, without any evidence of aortic stenosis.  5. The inferior vena cava is normal in size with greater than 50% respiratory variability, suggesting right atrial pressure of 3 mmHg. FINDINGS  Left Ventricle: Left ventricular ejection fraction, by estimation, is 60 to 65%. The left ventricle has normal function.  The left ventricle has no regional wall motion abnormalities. The left ventricular internal cavity size was small. There is moderate  concentric left ventricular hypertrophy. Left ventricular diastolic parameters are consistent with Grade I diastolic dysfunction (impaired relaxation). Right Ventricle: The right ventricular size is normal. No increase in  Final   Shigella/Enteroinvasive E coli (EIEC) NOT DETECTED NOT DETECTED Final   Cryptosporidium NOT DETECTED NOT DETECTED Final   Cyclospora cayetanensis NOT DETECTED NOT DETECTED Final   Entamoeba histolytica NOT DETECTED NOT DETECTED Final   Giardia lamblia NOT DETECTED NOT DETECTED Final   Adenovirus F40/41 NOT DETECTED NOT DETECTED Final   Astrovirus NOT DETECTED NOT DETECTED Final   Norovirus GI/GII NOT DETECTED NOT DETECTED Final   Rotavirus A NOT DETECTED NOT DETECTED Final   Sapovirus (I, II, IV, and V) NOT DETECTED NOT DETECTED Final    Comment: Performed at [facility], [address] [place], [place] [address]  C Difficile Quick Screen w PCR reflex     Status: None   Collection Time: 04/16/23  8:26 PM   Specimen: Stool  Result Value Ref Range Status   C Diff antigen NEGATIVE NEGATIVE Final   C Diff toxin NEGATIVE NEGATIVE Final   C Diff interpretation No C. difficile detected.  Final     Comment: Performed at [facility], [address] [place], [place] [address]     Radiology Studies: ECHOCARDIOGRAM COMPLETE  Result Date: 04/17/2023    ECHOCARDIOGRAM REPORT   Patient Name:   [name] [name]  Date of Exam: 04/17/2023 Medical Rec #:  [id number]  Height:       72.0 in Accession #:    [id number] Weight:       115.3 lb Date of Birth:  Jul 01, 1933   BSA:          1.686 m Patient Age:    89 years   BP:           96/51 mmHg Patient Gender: M          HR:           115 bpm. Exam Location:  Inpatient Procedure: 2D Echo, Color Doppler and Cardiac Doppler Indications:    R94.31 Abnormal EKG  History:        Patient has no prior history of Echocardiogram examinations.                 Risk Factors:Hypertension.  Sonographer:    Irving Burton Senior RDCS Referring Phys: [id number] CHING T TU  Sonographer Comments: Windows limited by thin body habitus. IMPRESSIONS  1. Left ventricular ejection fraction, by estimation, is 60 to 65%. The left ventricle has normal function. The left ventricle has no regional wall motion abnormalities. There is moderate concentric left ventricular hypertrophy. Left ventricular diastolic parameters are consistent with Grade I diastolic dysfunction (impaired relaxation).  2. Right ventricular systolic function is normal. The right ventricular size is normal. Tricuspid regurgitation signal is inadequate for assessing PA pressure.  3. The mitral valve is normal in structure. Trivial mitral valve regurgitation.  4. The aortic valve is tricuspid. There is mild calcification of the aortic valve. Aortic valve regurgitation is not visualized. Aortic valve sclerosis/calcification is present, without any evidence of aortic stenosis.  5. The inferior vena cava is normal in size with greater than 50% respiratory variability, suggesting right atrial pressure of 3 mmHg. FINDINGS  Left Ventricle: Left ventricular ejection fraction, by estimation, is 60 to 65%. The left ventricle has normal function.  The left ventricle has no regional wall motion abnormalities. The left ventricular internal cavity size was small. There is moderate  concentric left ventricular hypertrophy. Left ventricular diastolic parameters are consistent with Grade I diastolic dysfunction (impaired relaxation). Right Ventricle: The right ventricular size is normal. No increase in  PROGRESS NOTE  [name] [name] WGN:[id number] DOB: May 12, 1934 DOA: 04/16/2023 PCP: Marcine Matar, MD   LOS: 1 day   Brief Narrative / Interim history: 89 year old male with history of dementia, dysphagia status post PEG tube, HTN, hypothyroidism, BPH, anemia who comes into the hospital with nausea, vomiting, diarrhea.  This has been going on for the past 3 days, and has been weaker and had increased falls.  He was febrile in the ER 101.8, was found to be COVID-positive.  He had an episode of SVT while in the ED that lasted about 5 minutes.  Cardiology was consulted  Subjective / 24h Interval events: Has underlying dementia, appears comfortable on my evaluation, slightly agitated  Assesement and Plan: Principal Problem:   COVID-19 virus infection Active Problems:   Hypothyroidism   Anemia, chronic disease   Essential hypertension   Dysphagia   Elevated troponin   AKI (acute kidney injury) (HCC)   Intractable nausea and vomiting   SVT (supraventricular tachycardia) (HCC)   PEG (percutaneous endoscopic gastrostomy) status (HCC)   COVID-19  Principal problem COVID-19-patient's symptoms have been going on for the past 3 days.  Paxlovid unable to be used per tube -Clinically stable  Active problems SVT-cardiology consulted, appreciate input.  Stable on metoprolol.  2D echo with normal LVEF 60 to 65%, grade 1 diastolic dysfunction -Continue to monitor on telemetry  Nausea, vomiting, diarrhea due to Salmonella colitis-colitis with concern for systemic symptoms with high fever, hypotension on 10/21.  Started on ceftriaxone, fever improved  AKI-creatinine slightly worse today, likely in the setting of ongoing diarrhea, nausea, soft blood pressures at time, with poor p.o. intake.  Continue IV fluids  Elevated troponin-likely in the setting of SVT  Dysphagia-status post PEG tube.  History of aspiration  Anemia of chronic disease-stable  Hypothyroidism-recent TSH of 39, PCP has  increased his levothyroxine from 50-75 last week.  TSH improving now.  Continue current dose  Underlying dementia -seems to have a poor baseline, on feeding tubes, advanced dementia.   History of BPH-required In-N-Out cath last night.  Start Flomax  Scheduled Meds:  allopurinol  50 mg Per Tube QODAY   Chlorhexidine Gluconate Cloth  6 each Topical Daily   dexamethasone  6 mg Per Tube Q24H   enoxaparin (LOVENOX) injection  30 mg Subcutaneous Q24H   feeding supplement (JEVITY 1.2 CAL)  237 mL Per Tube Q4H   [START ON 04/19/2023] ferrous sulfate  300 mg Per Tube QODAY   levothyroxine  75 mcg Per Tube QAC breakfast   metoprolol tartrate  12.5 mg Per Tube BID   multivitamin with minerals  1 tablet Per Tube Daily   thiamine  100 mg Per Tube Daily   Continuous Infusions:  cefTRIAXone (ROCEPHIN)  IV Stopped (04/17/23 1510)   lactated ringers 75 mL/hr at 04/18/23 0832   PRN Meds:.acetaminophen, ondansetron (ZOFRAN) IV  Current Outpatient Medications  Medication Instructions   allopurinol (ZYLOPRIM) 100 mg, Oral, Daily, Must keep appointment in October 2024 for additional refills.   aspirin EC 81 mg, Oral, Daily, Swallow whole.   bisoprolol (ZEBETA) 5 MG tablet Take 1 tablet (5 mg total) by mouth once daily.   cetirizine (ZYRTEC) 10 mg, Oral, Daily, Must have office visit for refills   Emollient (EUCERIN INTENSIVE REPAIR) OINT 1 Units, Apply externally, 2 times daily, To dry skin twice daily.   Iron, Ferrous Sulfate, 325 (65 Fe) MG TABS Take 1 tablet (325 mg) by mouth every other day. Please make an appointment with Dr.  Final   Shigella/Enteroinvasive E coli (EIEC) NOT DETECTED NOT DETECTED Final   Cryptosporidium NOT DETECTED NOT DETECTED Final   Cyclospora cayetanensis NOT DETECTED NOT DETECTED Final   Entamoeba histolytica NOT DETECTED NOT DETECTED Final   Giardia lamblia NOT DETECTED NOT DETECTED Final   Adenovirus F40/41 NOT DETECTED NOT DETECTED Final   Astrovirus NOT DETECTED NOT DETECTED Final   Norovirus GI/GII NOT DETECTED NOT DETECTED Final   Rotavirus A NOT DETECTED NOT DETECTED Final   Sapovirus (I, II, IV, and V) NOT DETECTED NOT DETECTED Final    Comment: Performed at [facility], [address] [place], [place] [address]  C Difficile Quick Screen w PCR reflex     Status: None   Collection Time: 04/16/23  8:26 PM   Specimen: Stool  Result Value Ref Range Status   C Diff antigen NEGATIVE NEGATIVE Final   C Diff toxin NEGATIVE NEGATIVE Final   C Diff interpretation No C. difficile detected.  Final     Comment: Performed at [facility], [address] [place], [place] [address]     Radiology Studies: ECHOCARDIOGRAM COMPLETE  Result Date: 04/17/2023    ECHOCARDIOGRAM REPORT   Patient Name:   [name] [name]  Date of Exam: 04/17/2023 Medical Rec #:  [id number]  Height:       72.0 in Accession #:    [id number] Weight:       115.3 lb Date of Birth:  Jul 01, 1933   BSA:          1.686 m Patient Age:    89 years   BP:           96/51 mmHg Patient Gender: M          HR:           115 bpm. Exam Location:  Inpatient Procedure: 2D Echo, Color Doppler and Cardiac Doppler Indications:    R94.31 Abnormal EKG  History:        Patient has no prior history of Echocardiogram examinations.                 Risk Factors:Hypertension.  Sonographer:    Irving Burton Senior RDCS Referring Phys: [id number] CHING T TU  Sonographer Comments: Windows limited by thin body habitus. IMPRESSIONS  1. Left ventricular ejection fraction, by estimation, is 60 to 65%. The left ventricle has normal function. The left ventricle has no regional wall motion abnormalities. There is moderate concentric left ventricular hypertrophy. Left ventricular diastolic parameters are consistent with Grade I diastolic dysfunction (impaired relaxation).  2. Right ventricular systolic function is normal. The right ventricular size is normal. Tricuspid regurgitation signal is inadequate for assessing PA pressure.  3. The mitral valve is normal in structure. Trivial mitral valve regurgitation.  4. The aortic valve is tricuspid. There is mild calcification of the aortic valve. Aortic valve regurgitation is not visualized. Aortic valve sclerosis/calcification is present, without any evidence of aortic stenosis.  5. The inferior vena cava is normal in size with greater than 50% respiratory variability, suggesting right atrial pressure of 3 mmHg. FINDINGS  Left Ventricle: Left ventricular ejection fraction, by estimation, is 60 to 65%. The left ventricle has normal function.  The left ventricle has no regional wall motion abnormalities. The left ventricular internal cavity size was small. There is moderate  concentric left ventricular hypertrophy. Left ventricular diastolic parameters are consistent with Grade I diastolic dysfunction (impaired relaxation). Right Ventricle: The right ventricular size is normal. No increase in

## 2023-04-18 NOTE — Progress Notes (Signed)
Telemetry reviewed.  Rhythm improved this morning, no further SVT.  BP 131/73.  Would continue metoprolol 12.5 mg BID.  Little Ishikawa, MD

## 2023-04-18 NOTE — Discharge Instructions (Addendum)
Recommend increasing home TF regimen on discharge in setting of ongoing outpatient weight loss: 1.5 cartons Jevity 1.5 QID + 1 carton Jevity 1.5 once daily (7 cartons daily) 30ml free water flushes before and after each bolus ( total) additional free water daily This regimen provides: 2485 kcal, 106g protein, free water   Follow with Marcine Matar, MD in 5-7 days  Please get a complete blood count and chemistry panel checked by your Primary MD at your next visit, and again as instructed by your Primary MD. Please get your medications reviewed and adjusted by your Primary MD.  Please request your Primary MD to go over all Hospital Tests and Procedure/Radiological results at the follow up, please get all Hospital records sent to your Prim MD by signing hospital release before you go home.  In some cases, there will be blood work, cultures and biopsy results pending at the time of your discharge. Please request that your primary care M.D. goes through all the records of your hospital data and follows up on these results.  If you had Pneumonia of Lung problems at the Hospital: Please get a 2 view Chest X ray done in 6-8 weeks after hospital discharge or sooner if instructed by your Primary MD.  If you have Congestive Heart Failure: Please call your Cardiologist or Primary MD anytime you have any of the following symptoms:  1) 3 pound weight gain in 24 hours or 5 pounds in 1 week  2) shortness of breath, with or without a dry hacking cough  3) swelling in the hands, feet or stomach  4) if you have to sleep on extra pillows at night in order to breathe  Follow cardiac low salt diet and 1.5 lit/day fluid restriction.  If you have diabetes Accuchecks 4 times/day, Once in AM empty stomach and then before each meal. Log in all results and show them to your primary doctor at your next visit. If any glucose reading is under 80 or above 300 call your primary MD  immediately.  If you have Seizure/Convulsions/Epilepsy: Please do not drive, operate heavy machinery, participate in activities at heights or participate in high speed sports until you have seen by Primary MD or a Neurologist and advised to do so again. Per [facility] statutes, patients with seizures are not allowed to drive until they have been seizure-free for six months.  Use caution when using heavy equipment or power tools. Avoid working on ladders or at heights. Take showers instead of baths. Ensure the water temperature is not too high on the home water heater. Do not go swimming alone. Do not lock yourself in a room alone (i.e. bathroom). When caring for infants or small children, sit down when holding, feeding, or changing them to minimize risk of injury to the child in the event you have a seizure. Maintain good sleep hygiene. Avoid alcohol.   If you had Gastrointestinal Bleeding: Please ask your Primary MD to check a complete blood count within one week of discharge or at your next visit. Your endoscopic/colonoscopic biopsies that are pending at the time of discharge, will also need to followed by your Primary MD.  Get Medicines reviewed and adjusted. Please take all your medications with you for your next visit with your Primary MD  Please request your Primary MD to go over all hospital tests and procedure/radiological results at the follow up, please ask your Primary MD to get all Hospital records sent to his/her office.  If you experience worsening of your admission symptoms, develop shortness of breath, life threatening emergency, suicidal or homicidal thoughts you must seek medical attention immediately by calling 911 or calling your MD immediately  if symptoms less severe.  You must read complete instructions/literature along with all the possible adverse reactions/side effects for all the Medicines you take and that have been prescribed to you. Take any new Medicines after  you have completely understood and accpet all the possible adverse reactions/side effects.   Do not drive or operate heavy machinery when taking Pain medications.   Do not take more than prescribed Pain, Sleep and Anxiety Medications  Special Instructions: If you have smoked or chewed Tobacco  in the last 2 yrs please stop smoking, stop any regular Alcohol  and or any Recreational drug use.  Wear Seat belts while driving.  Please note You were cared for by a hospitalist during your hospital stay. If you have any questions about your discharge medications or the care you received while you were in the hospital after you are discharged, you can call the unit and asked to speak with the hospitalist on call if the hospitalist that took care of you is not available. Once you are discharged, your primary care physician will handle any further medical issues. Please note that NO REFILLS for any discharge medications will be authorized once you are discharged, as it is imperative that you return to your primary care physician (or establish a relationship with a primary care physician if you do not have one) for your aftercare needs so that they can reassess your need for medications and monitor your lab values.  You can reach the hospitalist office at phone [phone] or fax [phone]   If you do not have a primary care physician, you can call [phone] for a physician referral.  Activity: As tolerated with Full fall precautions use walker/cane & assistance as needed    Diet: tube feeds  Disposition Home

## 2023-04-18 NOTE — Progress Notes (Signed)
Initial Nutrition Assessment  DOCUMENTATION CODES:   Severe malnutrition in context of chronic illness, Underweight  INTERVENTION:  Adjust TF via PEG during admission: Jevity 1.5 at 36ml/hr ( per day) 60ml ProSource TF 20 once daily free water flushes q4h ( total)  Provides 2240 kcal, 1122g protein and free water  Banatrol BID-provides 45kcal, 5g soluble fiber and 2g protein per serving.  Recommend increasing home TF regimen on discharge in setting of ongoing outpatient weight loss: 1.5 cartons Jevity 1.5 QID + 1 carton Jevity 1.5 once daily (7 cartons daily) 30ml free water flushes before and after each bolus ( total) additional free water daily This regimen provides: 2485 kcal, 106g protein, free water  NUTRITION DIAGNOSIS:   Severe Malnutrition related to chronic illness as evidenced by severe fat depletion, severe muscle depletion.  GOAL:   Patient will meet greater than or equal to 90% of their needs  MONITOR:   Labs, Weight trends, TF tolerance, I & O's  REASON FOR ASSESSMENT:   Consult Assessment of nutrition requirement/status  ASSESSMENT:   Pt admitted with nausea, vomiting and diarrhea x3 days PTA. Found to be COVID positive on admission with infectious colitis and AKI. PMH significant for dementia, dysphagia s/p PEG tube, HTN, hypothyroidism, severe protein malnutrition, BPH, anemia.   Met with pt at bedside. No family present at this time to obtain detailed nutrition related history from as pt noted to have history of dementia.   Noted bolus TF held over night d/t nausea and small emesis after Jevity bolus. Spoke with RN this morning who mentions that pt is expressing fullness d/t bolus, free water flushes and flushes with medications. Inquiring about adjusting regimen to provide less volume.   Pt last assessed by [place] RD 03/2022  Called and spoke with pt's son via phone call to obtain nutrition related  history. He reports that pt has been solely reliant on TF via PEG. He continues with 6 boluses per day. Unable to recall formula as his wife manages this. Per review of chart, pt still using Jevity. He reports that pt has been tolerating this well without nausea, vomiting or diarrhea up until acute illness.   Report of nausea, vomiting and diarrhea seems to be related to acute illness versus TF intolerance. However, will transition pt to continuous TF regimen for ease of administration. This will also provide less volume at one time in hopes to reduce bloating and emesis.   Unfortunately, there is limited weight history on file to review. Over the last year, it appears pt's weight has declined 11.3%. It is unclear how recently or rapidly this weight loss has occurred.  Per review of PCP note from 10/14, they noted a 17 lb weight loss x1 year.  Pt's current home TF regimen of 6 cartons Jevity 1.5 provides 2130 kcal, 91g protein and free water. In the setting of noted weight loss, would recommend increasing home TF regimen. Recommendations placed.   Medications: decadron, ferrous sulfate, MVI, thiamine Drips: abx, LR @ 23ml/hr  Labs: BUN 62, Cr 1.90, GFR 34, CBG's 108-128 x24 hours  NUTRITION - FOCUSED PHYSICAL EXAM:  Flowsheet Row Most Recent Value  Orbital Region Severe depletion  Upper Arm Region Severe depletion  Thoracic and Lumbar Region Severe depletion  Buccal Region Severe depletion  Temple Region Severe depletion  Clavicle Bone Region Severe depletion  Clavicle and Acromion Bone Region Severe depletion  Scapular Bone Region Severe depletion  Dorsal Hand Severe depletion  Patellar Region Severe depletion  Anterior Thigh Region Severe depletion  Posterior Calf Region Severe depletion  Edema (RD Assessment) None  Hair Reviewed  Eyes Reviewed  Mouth Reviewed  Skin Reviewed  Nails Reviewed       Diet Order:   Diet Order             Diet NPO time specified  Diet  effective now                   EDUCATION NEEDS:   No education needs have been identified at this time  Skin:  Skin Assessment: Reviewed RN Assessment  Last BM:  via FMS x24 hours  Height:   Ht Readings from Last 1 Encounters:  04/16/23 6' (1.829 m)    Weight:   Wt Readings from Last 1 Encounters:  04/16/23 52.3 kg   BMI:  Body mass index is 15.64 kg/m.  Estimated Nutritional Needs:   Kcal:  2100-2300  Protein:  105-120g  Fluid:  >/=1.7L  Drusilla Kanner, RDN, LDN Clinical Nutrition

## 2023-04-18 NOTE — Progress Notes (Signed)
Pt does not tolerate feeding, increase residuals 100 + held feedings. Pt CBG [id number]. Pt became nauseated and vomited small amt after Jevity, may need to adjust to another feeding pt  is able to digest without high residual. Family states pt does not eat this much, may need to adjust amount or interval of feeding will report to day RN to follow up with MD.  Consult to dietitian to review. SRP, RN

## 2023-04-19 LAB — GLUCOSE, CAPILLARY
Glucose-Capillary: 116 mg/dL — ABNORMAL HIGH (ref 70–99)
Glucose-Capillary: 88 mg/dL (ref 70–99)
Glucose-Capillary: 130 mg/dL — ABNORMAL HIGH (ref 70–99)
Glucose-Capillary: 103 mg/dL — ABNORMAL HIGH (ref 70–99)
Glucose-Capillary: 103 mg/dL — ABNORMAL HIGH (ref 70–99)

## 2023-04-19 LAB — COMPREHENSIVE METABOLIC PANEL
ALT: 20 U/L (ref 0–44)
AST: 30 U/L (ref 15–41)
Albumin: 2.6 g/dL — ABNORMAL LOW (ref 3.5–5.0)
Alkaline Phosphatase: 59 U/L (ref 38–126)
Anion gap: 12 (ref 5–15)
BUN: 76 mg/dL — ABNORMAL HIGH (ref 8–23)
CO2: 20 mmol/L — ABNORMAL LOW (ref 22–32)
Calcium: 10.1 mg/dL (ref 8.9–10.3)
Chloride: 112 mmol/L — ABNORMAL HIGH (ref 98–111)
Creatinine, Ser: 2.13 mg/dL — ABNORMAL HIGH (ref 0.61–1.24)
GFR, Estimated: 29 mL/min — ABNORMAL LOW (ref >60)
Glucose, Bld: 126 mg/dL — ABNORMAL HIGH (ref 70–99)
Potassium: 4.1 mmol/L (ref 3.5–5.1)
Sodium: 144 mmol/L (ref 135–145)
Total Bilirubin: 0.3 mg/dL (ref 0.3–1.2)
Total Protein: 6.9 g/dL (ref 6.5–8.1)

## 2023-04-19 LAB — CBC
HCT: 31.4 % — ABNORMAL LOW (ref 39.0–52.0)
Hemoglobin: 10.2 g/dL — ABNORMAL LOW (ref 13.0–17.0)
MCH: 30.1 pg (ref 26.0–34.0)
MCHC: 32.5 g/dL (ref 30.0–36.0)
MCV: 92.6 fL (ref 80.0–100.0)
Platelets: 246 10*3/uL (ref 150–400)
RBC: 3.39 MIL/uL — ABNORMAL LOW (ref 4.22–5.81)
RDW: 14.3 % (ref 11.5–15.5)
WBC: 7.8 10*3/uL (ref 4.0–10.5)
nRBC: 0 % (ref 0.0–0.2)

## 2023-04-19 LAB — MAGNESIUM: Magnesium: 2.5 mg/dL — ABNORMAL HIGH (ref 1.7–2.4)

## 2023-04-19 MED ORDER — ORAL CARE MOUTH RINSE: 15.0000 mL | OROMUCOSAL | Status: DC | PRN

## 2023-04-19 MED ORDER — ORAL CARE MOUTH RINSE
15.0000 mL | OROMUCOSAL | Status: DC
  Administered 2023-04-19 – 2023-04-22 (×16): 15 mL via OROMUCOSAL

## 2023-04-19 MED ORDER — LACTATED RINGERS IV SOLN
INTRAVENOUS | Status: AC
Start: 2023-04-19 — End: 2023-04-20

## 2023-04-19 NOTE — Progress Notes (Signed)
   04/19/23 1715  What Happened  Was fall witnessed? No  Was patient injured? No  Patient found on floor (on fall matt)  Found by Staff-comment  Stated prior activity other (comment) (patient told son he was scooting to end of bed just being busy)  Provider Notification  Provider Name/Title Dr Elvera Lennox  Date Provider Notified 04/19/23  Time Provider Notified 1715  Method of Notification Page  Notification Reason Fall  Provider response No new orders  Date of Provider Response 04/19/23  Time of Provider Response 1715  Follow Up  Family notified Yes - comment  Time family notified 1725  Additional tests No  Progress note created (see row info) Yes  Adult Fall Risk Assessment  Risk Factor Category (scoring not indicated) High fall risk per protocol (document High fall risk)  Patient Fall Risk Level High fall risk  Adult Fall Risk Interventions  Required Bundle Interventions *See Row Information* High fall risk - low, moderate, and high requirements implemented  Vitals  BP (!) 149/118  Pulse Rate 92  ECG Heart Rate 94  Resp 17  Oxygen Therapy  SpO2 100 %  O2 Device Room Air  Pain Assessment  Pain Scale PAINAD  PAINAD (Pain Assessment in Advanced Dementia)  Breathing 0  Negative Vocalization 0  Facial Expression 0  Body Language 0  Consolability 0  PAINAD Score 0  PCA/Epidural/Spinal Assessment  Respiratory Pattern Regular;Unlabored  Neurological  Neuro (WDL) X  Level of Consciousness Alert  Orientation Level Oriented to person;Disoriented to time;Disoriented to situation;Disoriented to place  Cognition Memory impairment;Impulsive  Speech Clear;Nods/gestures appropriately  R Pupil Size (mm) 3  R Pupil Shape Round  R Pupil Reaction Brisk  L Pupil Size (mm) 3  L Pupil Shape Round  L Pupil Reaction Brisk  Motor Function/Sensation Assessment Grip;Motor response  R Hand Grip Strong  L Hand Grip Strong  RUE Motor Response Purposeful movement  LUE Motor Response  Purposeful movement  RLE Motor Response Purposeful movement  LLE Motor Response Purposeful movement  Neuro Symptoms None  Glasgow Coma Scale  Eye Opening 4  Best Verbal Response (NON-intubated) 4  Best Motor Response 6  Glasgow Coma Scale Score 14  Musculoskeletal  Musculoskeletal (WDL) X  Assistive Device Cane  Generalized Weakness Yes  Weight Bearing Restrictions No  Musculoskeletal Details  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

## 2023-04-19 NOTE — Plan of Care (Signed)
  Problem: Education: Goal: Knowledge of General Education information will improve Description Including pain rating scale, medication(s)/side effects and non-pharmacologic comfort measures Outcome: Progressing   

## 2023-04-19 NOTE — TOC Initial Note (Signed)
Transition of Care [facility]) - Initial/Assessment Note    Patient Details  Name: [name] [name] MRN: [id number] Date of Birth: [birth year]  [facility] CM/SW Contact:    Leone Haven, RN Phone Number: 04/19/2023, 4:23 PM  Clinical Narrative:                 Per son , From home with son, has PCP , Dr. Laural Benes at [facility] clinic and no insurance on file, has a orange card states has no HH services in place at this time or DME at home.  Son will transport him home at [employer] and he is support system, states gets medications from [facility] and [facility] [employer].   Pta self ambulatory . Son states he will need a cane, NCM will see if can get a cane for charity for him.  [employer] will supply the cane.   Expected Discharge Plan: Home/Self Care Barriers to Discharge: Continued Medical Work up   Patient Goals and CMS Choice Patient states their goals for this hospitalization and ongoing recovery are:: home   Choice offered to / list presented to : NA      Expected Discharge Plan and Services In-house Referral: NA Discharge Planning Services: CM Consult Post Acute Care Choice: NA Living arrangements for the past 2 months: Single Family Home                   DME Agency: NA       HH Arranged: NA          Prior Living Arrangements/Services Living arrangements for the past 2 months: Single Family Home Lives with:: Adult Children Patient language and need for interpreter reviewed:: Yes Do you feel safe going back to the place where you live?: Yes      Need for Family Participation in Patient Care: Yes (Comment) Care giver support system in place?: Yes (comment)   Criminal Activity/Legal Involvement Pertinent to Current Situation/Hospitalization: No - Comment as needed  Activities of Daily Living      Permission Sought/Granted Permission sought to share information with : Case Manager Permission granted to share information with : Yes, Verbal Permission Granted               Emotional Assessment         Alcohol / Substance Use: Not Applicable Psych Involvement: No (comment)  Admission diagnosis:  Intractable nausea and vomiting [R11.2] COVID-19 [U07.1] Patient Active Problem List   Diagnosis Date Noted   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   COVID-19 04/17/2023   Intractable nausea and vomiting 04/16/2023   COVID-19 virus infection 04/16/2023   SVT (supraventricular tachycardia) (HCC) 04/16/2023   Protein-calorie malnutrition, severe 04/08/2022   Dysphagia 04/02/2022   Aspiration pneumonia (HCC) 04/02/2022   Hyponatremia 04/02/2022   Elevated troponin 04/02/2022   AKI (acute kidney injury) (HCC) 04/02/2022   Edema 12/07/2021   Venous insufficiency of both lower extremities 12/07/2021   Eczema 12/07/2021   Hx of gout 01/24/2017   Hypothyroidism 10/30/2008   Anemia, chronic disease 09/18/2008   Essential hypertension 09/18/2008   BENIGN PROSTATIC HYPERTROPHY, WITH URINARY OBSTRUCTION 09/18/2008   Backache 09/18/2008   PCP:  Marcine Matar, MD Pharmacy:   [facility] [address] [place] [address] Phone: [phone] Fax: [phone]  [facility] [address] [place] [address] Phone: [phone] Fax: [phone]  [id number] - [place], [place] -  [address] AT [facility] [facility] [address] [place] [address] Phone: [phone] Fax: [phone]     Social Determinants of Health (SDOH) Social History: SDOH Screenings   Depression (PHQ2-9): Medium Risk (03/10/2022)  Tobacco Use: Low Risk  (04/16/2023)   SDOH Interventions:     Readmission Risk Interventions     No data to display

## 2023-04-19 NOTE — Progress Notes (Signed)
PROGRESS NOTE  [name] [name] MWU:[id number] DOB: 03/10/1934 DOA: 04/16/2023 PCP: Marcine Matar, MD   LOS: 2 days   Brief Narrative / Interim history: 89 year old male with history of dementia, dysphagia status post PEG tube, HTN, hypothyroidism, BPH, anemia who comes into the hospital with nausea, vomiting, diarrhea.  This has been going on for the past 3 days, and has been weaker and had increased falls.  He was febrile in the ER 101.8, was found to be COVID-positive.  He had an episode of SVT while in the ED that lasted about 5 minutes.  Cardiology was consulted  Subjective / 24h Interval events: Appears comfortable.  Afebrile.  No further SVT  Assesement and Plan: Principal Problem:   COVID-19 virus infection Active Problems:   Hypothyroidism   Anemia, chronic disease   Essential hypertension   Dysphagia   Elevated troponin   AKI (acute kidney injury) (HCC)   Intractable nausea and vomiting   SVT (supraventricular tachycardia) (HCC)   PEG (percutaneous endoscopic gastrostomy) status (HCC)   COVID-19  Principal problem COVID-19-patient's symptoms have been going on for 3 days prior to admission.  Paxlovid unable to be used per tube -Clinically stable, has never been hypoxic, discontinue Decadron  Active problems SVT-cardiology consulted, appreciate input.  Stable on metoprolol.  2D echo with normal LVEF 60 to 65%, grade 1 diastolic dysfunction -Continue to monitor on telemetry  Nausea, vomiting, diarrhea due to Salmonella colitis-colitis with concern for systemic symptoms with high fever, hypotension on 10/21.  Started on ceftriaxone 10/21, fever resolved following initiation of antibiotics -Appears to start tolerating tube feeds better with less vomiting.  Continue to monitor -Continue Flexi-Seal for now  AKI-likely in the setting of nausea, vomiting, diarrhea.  Continue IV fluids, increase rate today  Elevated troponin-likely in the setting of SVT  Dysphagia-status  post PEG tube.  History of aspiration  Anemia of chronic disease-stable  Hypothyroidism-recent TSH of 39, PCP has increased his levothyroxine from 50-75 last week.  TSH improving now.  Continue current dose  Underlying dementia -seems to have a poor baseline, on feeding tubes, advanced dementia. PT consult pending  History of BPH-required In-N-Out cath, now has a Foley.  Voiding trial closer to discharge  Scheduled Meds:  allopurinol  50 mg Per Tube QODAY   Chlorhexidine Gluconate Cloth  6 each Topical Daily   dexamethasone  6 mg Per Tube Q24H   enoxaparin (LOVENOX) injection  30 mg Subcutaneous Q24H   feeding supplement (PROSource TF20)  60 mL Per Tube Daily   ferrous sulfate  300 mg Per Tube QODAY   fiber supplement (BANATROL TF)  60 mL Per Tube BID   free water  100 mL Per Tube Q4H   levothyroxine  75 mcg Per Tube QAC breakfast   metoprolol tartrate  12.5 mg Per Tube BID   multivitamin with minerals  1 tablet Per Tube Daily   mouth rinse  15 mL Mouth Rinse 4 times per day   thiamine  100 mg Per Tube Daily   Continuous Infusions:  cefTRIAXone (ROCEPHIN)  IV Stopped (04/18/23 1604)   feeding supplement (JEVITY 1.5 CAL/FIBER) 60 mL/hr at 04/19/23 0600   lactated ringers     PRN Meds:.acetaminophen, ondansetron (ZOFRAN) IV, mouth rinse  Current Outpatient Medications  Medication Instructions   allopurinol (ZYLOPRIM) 100 mg, Oral, Daily, Must keep appointment in October 2024 for additional refills.   aspirin EC 81 mg, Oral, Daily, Swallow whole.   bisoprolol (ZEBETA) 5 MG tablet Take 1  tablet (5 mg total) by mouth once daily.   cetirizine (ZYRTEC) 10 mg, Oral, Daily, Must have office visit for refills   Emollient (EUCERIN INTENSIVE REPAIR) OINT 1 Units, Apply externally, 2 times daily, To dry skin twice daily.   Iron, Ferrous Sulfate, 325 (65 Fe) MG TABS Take 1 tablet (325 mg) by mouth every other day. Please make an appointment with Dr. Laural Benes for more refills.   levothyroxine  (SYNTHROID) 75 mcg, Oral, Daily before breakfast   triamcinolone cream (KENALOG) 0.1 % Apply 1 application topically to the affected area(s) 2 (two) times daily.   Water For Irrigation, Sterile (FREE WATER) SOLN 50 mL free water flush via PEG tube before and after each bolus    Diet Orders (From admission, onward)     Start     Ordered   04/16/23 2307  Diet NPO time specified  Diet effective now        04/16/23 2307            DVT prophylaxis: enoxaparin (LOVENOX) injection 30 mg Start: 04/18/23 1000   Lab Results  Component Value Date   PLT 246 04/19/2023      Code Status: Full Code  Family Communication: no family at bedside   Status is: Inpatient  Level of care: Progressive  Consultants:  Cardiology   Objective: Vitals:   04/18/23 1940 04/19/23 0300 04/19/23 0554 04/19/23 0821  BP: 134/74 (!) 153/99 (!) 162/80 (!) 153/95  Pulse: 84  98 92  Resp: 17 15 19 14   Temp: 98.3 F (36.8 C) 98.3 F (36.8 C) 98 F (36.7 C) (!) 97.5 F (36.4 C)  TempSrc: Axillary Axillary Axillary Oral  SpO2: 100% 99% 100% 100%  Weight:      Height:        Intake/Output Summary (Last 24 hours) at 04/19/2023 1035 Last data filed at 04/19/2023 0600 Gross per 24 hour  Intake 2622.68 ml  Output 1475 ml  Net 1147.68 ml   Wt Readings from Last 3 Encounters:  04/16/23 52.3 kg  04/10/23 54.1 kg  04/08/22 61.7 kg    Examination:  Constitutional: NAD Eyes: lids and conjunctivae normal, no scleral icterus ENMT: mmm Neck: normal, supple Respiratory: clear to auscultation bilaterally, no wheezing, no crackles. Normal respiratory effort.  Cardiovascular: Regular rate and rhythm, no murmurs / rubs / gallops. No LE edema. Abdomen: soft, no distention, no tenderness. Bowel sounds positive.   Data Reviewed: I have independently reviewed following labs and imaging studies   CBC Recent Labs  Lab 04/16/23 0936 04/17/23 1206 04/18/23 0442 04/19/23 0311  WBC 8.6 5.8 5.5 7.8  HGB  11.0* 11.0* 9.6* 10.2*  HCT 32.8* 33.7* 29.5* 31.4*  PLT 248 284 221 246  MCV 92.4 92.6 93.4 92.6  MCH 31.0 30.2 30.4 30.1  MCHC 33.5 32.6 32.5 32.5  RDW 14.2 14.2 14.2 14.3    Recent Labs  Lab 04/14/23 1557 04/16/23 0936 04/16/23 1026 04/16/23 1320 04/17/23 0256 04/17/23 1206 04/17/23 1831 04/18/23 0442 04/19/23 0311  NA  --  136  --   --  138  --   --  141 144  K 5.0 5.0  --   --  5.1  --   --  4.3 4.1  CL  --  101  --   --  102  --   --  107 112*  CO2  --  28  --   --  24  --   --  24 20*  GLUCOSE  --  137*  --   --  107*  --   --  123* 126*  BUN  --  52*  --   --  53*  --   --  62* 76*  CREATININE  --  1.44*  --   --  1.48*  --   --  1.90* 2.13*  CALCIUM  --  10.6*  --   --  10.0  --   --  9.9 10.1  AST  --  20  --   --   --   --   --  29 30  ALT  --  12  --   --   --   --   --  20 20  ALKPHOS  --  62  --   --   --   --   --  50 59  BILITOT  --  0.5  --   --   --   --   --  0.5 0.3  ALBUMIN  --  3.7  --   --   --   --   --  2.4* 2.6*  MG  --   --   --   --   --   --   --  2.3 2.5*  LATICACIDVEN  --  1.8  --  1.0  --   --   --   --   --   TSH  --   --   --   --   --  18.819*  --   --   --   HGBA1C  --   --   --   --   --   --  5.4  --   --   AMMONIA  --   --  20  --   --   --   --   --   --     ------------------------------------------------------------------------------------------------------------------ Recent Labs    04/17/23 1831  CHOL 129  HDL 44  LDLCALC 72  TRIG 64  CHOLHDL 2.9    Lab Results  Component Value Date   HGBA1C 5.4 04/17/2023   ------------------------------------------------------------------------------------------------------------------ Recent Labs    04/17/23 1206  TSH 18.819*    Cardiac Enzymes No results for input(s): "CKMB", "TROPONINI", "MYOGLOBIN" in the last 168 hours.  Invalid input(s): "CK" ------------------------------------------------------------------------------------------------------------------     Component Value Date/Time   BNP 61.6 12/10/2021 1749    CBG: Recent Labs  Lab 04/18/23 0804 04/18/23 1130 04/18/23 1633 04/19/23 0553 04/19/23 0826  GLUCAP 108* 136* 124* 116* 88    Recent Results (from the past 240 hour(s))  Blood culture (routine x 2)     Status: None (Preliminary result)   Collection Time: 04/16/23  9:36 AM   Specimen: BLOOD  Result Value Ref Range Status   Specimen Description   Final    BLOOD RIGHT ANTECUBITAL Performed at [facility], [address] [place], [place] [address]    Special Requests   Final    BOTTLES DRAWN AEROBIC AND ANAEROBIC Blood Culture adequate volume Performed at [facility], [address], [place], [place] [address]    Culture   Final    NO GROWTH 3 DAYS Performed at [facility], [address], [place], [place] [address]    Report Status PENDING  Incomplete  Blood culture (routine x 2)     Status: None (Preliminary result)   Collection Time: 04/16/23 10:00 AM   Specimen: BLOOD RIGHT ARM  Result Value Ref Range Status   Specimen Description   Final    BLOOD RIGHT ARM Performed at [facility], [address], [place], [place] [address]    Special Requests   Final    BOTTLES DRAWN AEROBIC ONLY Blood Culture adequate volume Performed at [facility], [address] [place], [place] [address]    Culture   Final    NO GROWTH 3 DAYS Performed at [facility], [address], [place], [place] [address]    Report Status PENDING  Incomplete  Resp panel by RT-PCR (RSV, Flu A&B, Covid) Anterior Nasal Swab     Status: Abnormal   Collection Time: 04/16/23  2:59 PM   Specimen: Anterior Nasal Swab  Result Value Ref Range Status   SARS Coronavirus 2 by RT PCR POSITIVE (A) NEGATIVE Final    Comment: (NOTE) SARS-CoV-2 target nucleic acids are DETECTED.  The SARS-CoV-2 RNA is generally detectable in upper respiratory specimens during the acute  phase of infection. Positive results are indicative of the presence of the identified virus, but do not rule out bacterial infection or co-infection with other pathogens not detected by the test. Clinical correlation with patient history and other diagnostic information is necessary to determine patient infection status. The expected result is Negative.  Fact Sheet for Patients: BloggerCourse.com  Fact Sheet for Healthcare Providers: SeriousBroker.it  This test is not yet approved or cleared by the [place] FDA and  has been authorized for detection and/or diagnosis of SARS-CoV-2 by FDA under an Emergency Use Authorization (EUA).  This EUA will remain in effect (meaning this test can be used) for the duration of  the COVID-19 declaration under Section 564(b)(1) of the A ct, 21 U.S.C. section 360bbb-3(b)(1), unless the authorization is terminated or revoked sooner.     Influenza A by PCR NEGATIVE NEGATIVE Final   Influenza B by PCR NEGATIVE NEGATIVE Final    Comment: (NOTE) The Xpert Xpress SARS-CoV-2/FLU/RSV plus assay is intended as an aid in the diagnosis of influenza from Nasopharyngeal swab specimens and should not be used as a sole basis for treatment. Nasal washings and aspirates are unacceptable for Xpert Xpress SARS-CoV-2/FLU/RSV testing.  Fact Sheet for Patients: BloggerCourse.com  Fact Sheet for Healthcare Providers: SeriousBroker.it  This test is not yet approved or cleared by the [place] FDA and has been authorized for detection and/or diagnosis of SARS-CoV-2 by FDA under an Emergency Use Authorization (EUA). This EUA will remain in effect (meaning this test can be used) for the duration of the COVID-19 declaration under Section 564(b)(1) of the Act, 21 U.S.C. section 360bbb-3(b)(1), unless the authorization is terminated or revoked.     Resp Syncytial  Virus by PCR NEGATIVE NEGATIVE Final    Comment: (NOTE) Fact Sheet for Patients: BloggerCourse.com  Fact Sheet for Healthcare Providers: SeriousBroker.it  This test is not yet approved or cleared by the [place] FDA and has been authorized for detection and/or diagnosis of SARS-CoV-2 by FDA under an Emergency Use Authorization (EUA). This EUA will remain in effect (meaning this test can be used) for the duration of the COVID-19 declaration under Section 564(b)(1) of the Act, 21 U.S.C. section 360bbb-3(b)(1), unless the authorization is terminated or revoked.  Performed at [facility], [address] [place], [place] [address]   Gastrointestinal Panel by PCR , Stool     Status: Abnormal   Collection Time: 04/16/23  8:26 PM   Specimen: Stool  Result Value Ref  Range Status   Campylobacter species NOT DETECTED NOT DETECTED Final   Plesimonas shigelloides NOT DETECTED NOT DETECTED Final   Salmonella species DETECTED (A) NOT DETECTED Final    Comment: RESULT CALLED TO, READ BACK BY AND VERIFIED WITH: MAGGIE CHRISMON 1407 04/17/23 MU    Yersinia enterocolitica NOT DETECTED NOT DETECTED Final   Vibrio species NOT DETECTED NOT DETECTED Final   Vibrio cholerae NOT DETECTED NOT DETECTED Final   Enteroaggregative E coli (EAEC) NOT DETECTED NOT DETECTED Final   Enteropathogenic E coli (EPEC) NOT DETECTED NOT DETECTED Final   Enterotoxigenic E coli (ETEC) NOT DETECTED NOT DETECTED Final   Shiga like toxin producing E coli (STEC) NOT DETECTED NOT DETECTED Final   Shigella/Enteroinvasive E coli (EIEC) NOT DETECTED NOT DETECTED Final   Cryptosporidium NOT DETECTED NOT DETECTED Final   Cyclospora cayetanensis NOT DETECTED NOT DETECTED Final   Entamoeba histolytica NOT DETECTED NOT DETECTED Final   Giardia lamblia NOT DETECTED NOT DETECTED Final   Adenovirus F40/41 NOT DETECTED NOT DETECTED Final   Astrovirus NOT  DETECTED NOT DETECTED Final   Norovirus GI/GII NOT DETECTED NOT DETECTED Final   Rotavirus A NOT DETECTED NOT DETECTED Final   Sapovirus (I, II, IV, and V) NOT DETECTED NOT DETECTED Final    Comment: Performed at [facility], [address] [place], [place] [address]  C Difficile Quick Screen w PCR reflex     Status: None   Collection Time: 04/16/23  8:26 PM   Specimen: Stool  Result Value Ref Range Status   C Diff antigen NEGATIVE NEGATIVE Final   C Diff toxin NEGATIVE NEGATIVE Final   C Diff interpretation No C. difficile detected.  Final    Comment: Performed at [name], [address] [place], [place] [address]     Radiology Studies: No results found.   Pamella Pert, MD, PhD [facility]  Between 7 am - 7 pm I am available, please contact me via Amion (for emergencies) or Securechat (non urgent messages)  Between 7 pm - 7 am I am not available, please contact night coverage MD/APP via Amion

## 2023-04-19 NOTE — Progress Notes (Signed)
Telemetry reviewed, no further SVT.  Continue metoprolol.  Cardiology will sign off at this time, please call if any questions arise.  Little Ishikawa, MD

## 2023-04-20 LAB — BASIC METABOLIC PANEL
Anion gap: 9 (ref 5–15)
BUN: 66 mg/dL — ABNORMAL HIGH (ref 8–23)
CO2: 23 mmol/L (ref 22–32)
Calcium: 9.6 mg/dL (ref 8.9–10.3)
Chloride: 116 mmol/L — ABNORMAL HIGH (ref 98–111)
Creatinine, Ser: 1.58 mg/dL — ABNORMAL HIGH (ref 0.61–1.24)
GFR, Estimated: 42 mL/min — ABNORMAL LOW (ref >60)
Glucose, Bld: 82 mg/dL (ref 70–99)
Potassium: 3.9 mmol/L (ref 3.5–5.1)
Sodium: 148 mmol/L — ABNORMAL HIGH (ref 135–145)

## 2023-04-20 LAB — GLUCOSE, CAPILLARY
Glucose-Capillary: 100 mg/dL — ABNORMAL HIGH (ref 70–99)
Glucose-Capillary: 72 mg/dL (ref 70–99)
Glucose-Capillary: 81 mg/dL (ref 70–99)
Glucose-Capillary: 86 mg/dL (ref 70–99)
Glucose-Capillary: 90 mg/dL (ref 70–99)

## 2023-04-20 MED ORDER — FREE WATER
200.0000 mL | Status: DC
  Administered 2023-04-20 – 2023-04-23 (×17): 200 mL

## 2023-04-20 MED ORDER — LACTATED RINGERS IV SOLN
INTRAVENOUS | Status: AC
Start: 2023-04-20 — End: 2023-04-21

## 2023-04-20 NOTE — Progress Notes (Signed)
OT Cancellation Note  Patient Details Name: [name] MRN: [id number] DOB: December 28, 1933   Cancelled Treatment:    Reason Eval/Treat Not Completed: Other (comment) [employer] interpreter not at bedside and per staff, pt requires a different dialect that ipad interpreter can provide. Contacted son who plans to come to hospital at 10AM to assist with interpreting PT/OT evals.  Lorre Munroe 04/20/2023, 8:47 AM

## 2023-04-20 NOTE — Evaluation (Signed)
Occupational Therapy Evaluation Patient Details Name: [name] [name] MRN: [id number] DOB: 1933/09/14 Today's Date: 04/20/2023   History of Present Illness 89 y/o male presents to [facility] 04/16/23 w/ N/V and diarrhea w/ weakness and increased falls. Found to be Covid+ with run of SVT while in ED. Also admitted w/ salmonella colitis and AKI. PMHx: dementia, dysphagia s/p PEG tube, HTN, BPH, anemia.   Clinical Impression   PTA, pt lives with family, typically ambulatory with a cane and able to manage ADLs (aside from bathing assist for safety). Per family, pt with recent falls only due to this recent bout of illness. Pt presents now with deficits in strength, standing balance, and endurance. Pt able to follow commands consistently w/ assist of family to interpret. Pt requires Min-Mod A for bed mobility, Mod A x 2 for standing progressing to Min A x 2 with trial of RW. Pt requires Min A for UB ADL and up to Mod A for LB ADLs d/t deficits. Pt's family plan to take pt home at DC, able to provide 24/7 assist. Provided gait belt for home use and recommend wheelchair and BSC at DC based on current functional abilities.       If plan is discharge home, recommend the following: A lot of help with walking and/or transfers;A lot of help with bathing/dressing/bathroom    Functional Status Assessment  Patient has had a recent decline in their functional status and demonstrates the ability to make significant improvements in function in a reasonable and predictable amount of time.  Equipment Recommendations  BSC/3in1;Wheelchair (measurements OT);Wheelchair cushion (measurements OT) (TBD pending progress)    Recommendations for Other Services       Precautions / Restrictions Precautions Precautions: Fall;Other (comment) Precaution Comments: flexiseal, PEG Restrictions Weight Bearing Restrictions: No      Mobility Bed Mobility Overal bed mobility: Needs Assistance Bed Mobility: Supine to Sit, Sit to Supine      Supine to sit: Mod assist, HOB elevated, Used rails Sit to supine: Min assist   General bed mobility comments: assist to bring LE to EOB and handheld assist to lift trunk with good effort on pt part    Transfers Overall transfer level: Needs assistance Equipment used: Rolling walker (2 wheels), 2 person hand held assist Transfers: Sit to/from Stand Sit to Stand: Mod assist, Min assist, +2 safety/equipment, +2 physical assistance           General transfer comment: Mod A x 2 to stand from bedside with handheld assist w/ noted posterior lean. improved with RW to Min A x 2 for safety      Balance Overall balance assessment: Needs assistance Sitting-balance support: No upper extremity supported, Feet supported Sitting balance-Leahy Scale: Fair     Standing balance support: Bilateral upper extremity supported, During functional activity Standing balance-Leahy Scale: Poor                             ADL either performed or assessed with clinical judgement   ADL Overall ADL's : Needs assistance/impaired Eating/Feeding: NPO   Grooming: Sitting;Supervision/safety   Upper Body Bathing: Minimal assistance;Sitting   Lower Body Bathing: Moderate assistance;Sitting/lateral leans;Sit to/from stand   Upper Body Dressing : Minimal assistance;Sitting   Lower Body Dressing: Moderate assistance;Sitting/lateral leans;Sit to/from stand   Toilet Transfer: Minimal assistance;Stand-pivot;Rolling walker (2 wheels);BSC/3in1   Toileting- Clothing Manipulation and Hygiene: Sitting/lateral lean;Sit to/from stand;Moderate assistance  Vision Baseline Vision/History: 0 No visual deficits Ability to See in Adequate Light: 0 Adequate Patient Visual Report: No change from baseline Vision Assessment?: No apparent visual deficits     Perception         Praxis         Pertinent Vitals/Pain Pain Assessment Pain Assessment: Faces Faces Pain Scale: Hurts a  little bit Pain Location: suspect flexiseal site Pain Descriptors / Indicators: Grimacing, Guarding Pain Intervention(s): Monitored during session, Limited activity within patient's tolerance     Extremity/Trunk Assessment Upper Extremity Assessment Upper Extremity Assessment: Generalized weakness;Right hand dominant   Lower Extremity Assessment Lower Extremity Assessment: Defer to PT evaluation   Cervical / Trunk Assessment Cervical / Trunk Assessment: Normal   Communication Communication Communication: No apparent difficulties;Other (comment) (family assisted with interpreting) Cueing Techniques: Verbal cues;Gestural cues   Cognition Arousal: Alert Behavior During Therapy: WFL for tasks assessed/performed Overall Cognitive Status: History of cognitive impairments - at baseline                                 General Comments: hx of dementia but follows directions consistently, benefits from encouragement to participate.     General Comments  Daughter in law at bedside with her children to interpret and son entering shortly after. provided gait belt    Exercises     Shoulder Instructions      Home Living Family/patient expects to be discharged to:: Private residence Living Arrangements: Children;Other relatives (daughter in law and son) Available Help at Discharge: Family;Available 24 hours/day Type of Home: House Home Access: Stairs to enter [employer] of Steps: 3 Entrance Stairs-Rails: Right;Left Home Layout: One level     Bathroom Shower/Tub: Producer, television/film/video: Standard     Home Equipment: Agricultural consultant (2 wheels);Cane - single point          Prior Functioning/Environment Prior Level of Function : Needs assist             Mobility Comments: cane for mobility. family take pt for daily walks outside ADLs Comments: Able to dress self. daughter in law assists with bathing tasks because she does not want him to  fall. able to toilet self. family mostly manage IADLs        OT Problem List: Decreased strength;Decreased activity tolerance;Impaired balance (sitting and/or standing);Decreased cognition;Decreased safety awareness;Decreased knowledge of use of DME or AE      OT Treatment/Interventions: Self-care/ADL training;Therapeutic exercise;Energy conservation;DME and/or AE instruction;Therapeutic activities;Patient/family education;Balance training    OT Goals(Current goals can be found in the care plan section) Acute Rehab OT Goals Patient Stated Goal: for pt to increase strength, not go home until ready OT Goal Formulation: With patient/family Time For Goal Achievement: 05/04/23 Potential to Achieve Goals: Good  OT Frequency: Min 1X/week    Co-evaluation              AM-PAC OT "6 Clicks" Daily Activity     Outcome Measure Help from another person eating meals?: Total (NPO) Help from another person taking care of personal grooming?: A Little Help from another person toileting, which includes using toliet, bedpan, or urinal?: A Lot Help from another person bathing (including washing, rinsing, drying)?: A Lot Help from another person to put on and taking off regular upper body clothing?: A Little Help from another person to put on and taking off regular lower body clothing?: A Lot 6 Click Score:  13   End of Session Equipment Utilized During Treatment: Rolling walker (2 wheels) Nurse Communication: Mobility status  Activity Tolerance: Patient tolerated treatment well Patient left: in bed;with call bell/phone within reach;with bed alarm set;with family/visitor present;with nursing/sitter in room  OT Visit Diagnosis: Unsteadiness on feet (R26.81);Other abnormalities of gait and mobility (R26.89);Muscle weakness (generalized) (M62.81)                Time: 1000-1028 OT Time Calculation (min): 28 min Charges:  OT General Charges $OT Visit: 1 Visit OT Evaluation $OT Eval Moderate  Complexity: 1 Mod  Bradd Canary, OTR/L Acute Rehab Services Office: [phone]   Lorre Munroe 04/20/2023, 10:50 AM

## 2023-04-20 NOTE — Evaluation (Signed)
Physical Therapy Evaluation Patient Details Name: [name] [name] MRN: [id number] DOB: 03/29/1934 Today's Date: 04/20/2023  History of Present Illness  89 y/o male presents to [facility] 04/16/23 w/ N/V and diarrhea w/ weakness and increased falls. Found to be Covid+ with run of SVT while in ED. Also admitted w/ salmonella colitis and AKI. PMHx: dementia, dysphagia s/p PEG tube, HTN, BPH, anemia.   Clinical Impression  Pt in bed upon arrival and agreeable to PT eval. According to family, prior to admission pt was independent ambulating with a SP cane. In today's session, pt was able to stand and take side steps with RW and MinAx2 for safety with lines and for balance. Pt has 24/7 family support at home who are eager to take pt home upon discharge. Pt followed commands with family encouragement and family interpreting. Pt presents to therapy session with decreased LE strength, balance, and activity tolerance. Pt would benefit from acute skilled PT to address functional impairments. At this time, family is not interested in home health services. Acute PT to follow.          If plan is discharge home, recommend the following: A little help with walking and/or transfers;A little help with bathing/dressing/bathroom;Direct supervision/assist for medications management;Assist for transportation;Direct supervision/assist for financial management;Help with stairs or ramp for entrance;Supervision due to cognitive status     Equipment Recommendations None recommended by PT     Functional Status Assessment Patient has had a recent decline in their functional status and demonstrates the ability to make significant improvements in function in a reasonable and predictable amount of time.     Precautions / Restrictions Precautions Precautions: Fall;Other (comment) Precaution Comments: flexiseal, PEG Restrictions Weight Bearing Restrictions: No      Mobility  Bed Mobility Overal bed mobility: Needs Assistance Bed  Mobility: Supine to Sit, Sit to Supine     Supine to sit: Mod assist, HOB elevated, Used rails Sit to supine: Min assist   General bed mobility comments: MinA to bring LE towards EOB, HH assist for trunk elevation    Transfers Overall transfer level: Needs assistance Equipment used: Rolling walker (2 wheels), 2 person hand held assist Transfers: Sit to/from Stand Sit to Stand: Mod assist, Min assist, +2 safety/equipment, +2 physical assistance           General transfer comment: ModAx2 on initial STS w/ HH assist and posterior lean. Family mentioned pt typically cannot stand upright due to LBP. Second STS MinAx2 for safety with RW. Pt able to take side steps towards [facility] with MinA for balance           Balance Overall balance assessment: Needs assistance Sitting-balance support: No upper extremity supported, Feet supported Sitting balance-Leahy Scale: Fair     Standing balance support: Bilateral upper extremity supported, During functional activity Standing balance-Leahy Scale: Poor Standing balance comment: reliant on UE for support          Pertinent Vitals/Pain Pain Assessment Pain Assessment: Faces Faces Pain Scale: Hurts a little bit Pain Location: suspect flexiseal site Pain Descriptors / Indicators: Grimacing, Guarding Pain Intervention(s): Limited activity within patient's tolerance, Monitored during session, Repositioned    Home Living Family/patient expects to be discharged to:: Private residence Living Arrangements: Children;Other relatives (daughter in law and son) Available Help at Discharge: Family;Available 24 hours/day Type of Home: House Home Access: Stairs to enter Entrance Stairs-Rails: Doctor, general practice of Steps: 3   Home Layout: One level Home Equipment: Agricultural consultant (2 wheels);Cane - single point  Prior Function Prior Level of Function : Needs assist      Mobility Comments: ambulates w/ cane for mobility, goes  on walks outside with family ADLs Comments: Able to dress self. daughter in law assists with bathing tasks because she does not want him to fall. able to toilet self. family mostly manage IADLs     Extremity/Trunk Assessment   Upper Extremity Assessment Upper Extremity Assessment: Defer to OT evaluation    Lower Extremity Assessment Lower Extremity Assessment: Generalized weakness    Cervical / Trunk Assessment Cervical / Trunk Assessment: Normal  Communication   Communication Communication: No apparent difficulties;Other (comment) (family assisted with interpreting, interpreters do not speak pt's dialect of arabic) Cueing Techniques: Verbal cues  Cognition Arousal: Alert Behavior During Therapy: WFL for tasks assessed/performed Overall Cognitive Status: History of cognitive impairments - at baseline        General Comments: hx of dementia, follows directions consistently, benefits from family encouraging pt to participate        General Comments General comments (skin integrity, edema, etc.): Daughter in law interpreting, son arrived shortly after.     PT Assessment Patient needs continued PT services  PT Problem List Decreased strength;Decreased activity tolerance;Decreased balance;Decreased mobility       PT Treatment Interventions DME instruction;Gait training;Stair training;Functional mobility training;Therapeutic activities;Therapeutic exercise;Neuromuscular re-education;Balance training;Patient/family education    PT Goals (Current goals can be found in the Care Plan section)  Acute Rehab PT Goals Patient Stated Goal: to get better PT Goal Formulation: With patient Time For Goal Achievement: 05/04/23 Potential to Achieve Goals: Good    Frequency Min 1X/week     Co-evaluation PT/OT/SLP Co-Evaluation/Treatment: Yes Reason for Co-Treatment: Complexity of the patient's impairments (multi-system involvement);Necessary to address cognition/behavior during  functional activity;To address functional/ADL transfers;For patient/therapist safety PT goals addressed during session: Mobility/safety with mobility;Balance;Proper use of DME OT goals addressed during session: ADL's and self-care       AM-PAC PT "6 Clicks" Mobility  Outcome Measure Help needed turning from your back to your side while in a flat bed without using bedrails?: A Little Help needed moving from lying on your back to sitting on the side of a flat bed without using bedrails?: A Little Help needed moving to and from a bed to a chair (including a wheelchair)?: A Lot Help needed standing up from a chair using your arms (e.g., wheelchair or bedside chair)?: A Lot Help needed to walk in hospital room?: Total Help needed climbing 3-5 steps with a railing? : Total 6 Click Score: 12    End of Session   Activity Tolerance: Patient limited by fatigue Patient left: in bed;with call bell/phone within reach;with family/visitor present Nurse Communication: Mobility status PT Visit Diagnosis: Unsteadiness on feet (R26.81);Muscle weakness (generalized) (M62.81)    Time: 1000-1027 PT Time Calculation (min) (ACUTE ONLY): 27 min   Charges:   PT Evaluation $PT Eval Low Complexity: 1 Low   PT General Charges $$ ACUTE PT VISIT: 1 Visit        Hilton Cork, PT, DPT Secure Chat Preferred  Rehab Office [phone]   Arturo Morton Brion Aliment 04/20/2023, 11:37 AM

## 2023-04-20 NOTE — Progress Notes (Signed)
PROGRESS NOTE  [name] [name] WUJ:[id number] DOB: 12-Jul-1933 DOA: 04/16/2023 PCP: Marcine Matar, MD   LOS: 3 days   Brief Narrative / Interim history: 89 year old male with history of dementia, dysphagia status post PEG tube, HTN, hypothyroidism, BPH, anemia who comes into the hospital with nausea, vomiting, diarrhea.  This has been going on for the past 3 days, and has been weaker and had increased falls.  He was febrile in the ER 101.8, was found to be COVID-positive.  He had an episode of SVT while in the ED that lasted about 5 minutes.  Cardiology was consulted  Subjective / 24h Interval events: Comfortable, afebrile, no further nausea or vomiting and he is not tolerating tube feeds  Assesement and Plan: Principal Problem:   COVID-19 virus infection Active Problems:   Hypothyroidism   Anemia, chronic disease   Essential hypertension   Dysphagia   Elevated troponin   AKI (acute kidney injury) (HCC)   Intractable nausea and vomiting   SVT (supraventricular tachycardia) (HCC)   PEG (percutaneous endoscopic gastrostomy) status (HCC)   COVID-19  Principal problem COVID-19-patient's symptoms have been going on for 3 days prior to admission.  Paxlovid unable to be used per tube -Clinically stable, no hypoxia noted  Active problems SVT-cardiology consulted, appreciate input.  Stable on metoprolol.  2D echo with normal LVEF 60 to 65%, grade 1 diastolic dysfunction -Continue to monitor on telemetry, no further issues  Nausea, vomiting, diarrhea due to Salmonella colitis-colitis with concern for systemic symptoms with high fever, hypotension on 10/21.  Started on ceftriaxone 10/21, fever resolved following initiation of antibiotics -Now tolerating tube feeds.  Probably can discontinue Flexi-Seal either later today or tomorrow  AKI-likely in the setting of nausea, vomiting, diarrhea.  BMP this morning pending  Elevated troponin-likely in the setting of SVT  Dysphagia-status post PEG  tube.  History of aspiration  Anemia of chronic disease-stable  Hypothyroidism-recent TSH of 39, PCP has increased his levothyroxine from 50-75 last week.  TSH improving now.  Continue current dose  Underlying dementia -seems to have a poor baseline, on feeding tubes, advanced dementia. PT consult pending  History of BPH-required In-N-Out cath, now has a Foley.  Voiding trial closer to discharge and once working with physical therapy  Scheduled Meds:  allopurinol  50 mg Per Tube QODAY   Chlorhexidine Gluconate Cloth  6 each Topical Daily   enoxaparin (LOVENOX) injection  30 mg Subcutaneous Q24H   feeding supplement (PROSource TF20)  60 mL Per Tube Daily   ferrous sulfate  300 mg Per Tube QODAY   fiber supplement (BANATROL TF)  60 mL Per Tube BID   free water  100 mL Per Tube Q4H   levothyroxine  75 mcg Per Tube QAC breakfast   metoprolol tartrate  12.5 mg Per Tube BID   multivitamin with minerals  1 tablet Per Tube Daily   mouth rinse  15 mL Mouth Rinse 4 times per day   thiamine  100 mg Per Tube Daily   Continuous Infusions:  cefTRIAXone (ROCEPHIN)  IV Stopped (04/19/23 1544)   feeding supplement (JEVITY 1.5 CAL/FIBER) 60 mL/hr at 04/20/23 1000   PRN Meds:.acetaminophen, ondansetron (ZOFRAN) IV, mouth rinse  Current Outpatient Medications  Medication Instructions   allopurinol (ZYLOPRIM) 100 mg, Oral, Daily, Must keep appointment in October 2024 for additional refills.   aspirin EC 81 mg, Oral, Daily, Swallow whole.   bisoprolol (ZEBETA) 5 MG tablet Take 1 tablet (5 mg total) by mouth once daily.  cetirizine (ZYRTEC) 10 mg, Oral, Daily, Must have office visit for refills   Emollient (EUCERIN INTENSIVE REPAIR) OINT 1 Units, Apply externally, 2 times daily, To dry skin twice daily.   Iron, Ferrous Sulfate, 325 (65 Fe) MG TABS Take 1 tablet (325 mg) by mouth every other day. Please make an appointment with Dr. Laural Benes for more refills.   levothyroxine (SYNTHROID) 75 mcg, Oral,  Daily before breakfast   triamcinolone cream (KENALOG) 0.1 % Apply 1 application topically to the affected area(s) 2 (two) times daily.   Water For Irrigation, Sterile (FREE WATER) SOLN 50 mL free water flush via PEG tube before and after each bolus    Diet Orders (From admission, onward)     Start     Ordered   04/16/23 2307  Diet NPO time specified  Diet effective now        04/16/23 2307            DVT prophylaxis: enoxaparin (LOVENOX) injection 30 mg Start: 04/18/23 1000   Lab Results  Component Value Date   PLT 246 04/19/2023      Code Status: Full Code  Family Communication: no family at bedside   Status is: Inpatient  Level of care: Progressive  Consultants:  Cardiology   Objective: Vitals:   04/20/23 0300 04/20/23 0400 04/20/23 0730 04/20/23 0800  BP: 137/68 (!) 130/59 (!) 140/74 119/64  Pulse:   98   Resp: 16 16 16 14   Temp: 98.5 F (36.9 C)  97.9 F (36.6 C)   TempSrc: Oral  Oral   SpO2: 99%  99%   Weight: 55.8 kg     Height:        Intake/Output Summary (Last 24 hours) at 04/20/2023 1104 Last data filed at 04/20/2023 1000 Gross per 24 hour  Intake 4265.35 ml  Output 1600 ml  Net 2665.35 ml   Wt Readings from Last 3 Encounters:  04/20/23 55.8 kg  04/10/23 54.1 kg  04/08/22 61.7 kg    Examination:  Constitutional: NAD Eyes: lids and conjunctivae normal, no scleral icterus ENMT: mmm Neck: normal, supple Respiratory: clear to auscultation bilaterally, no wheezing, no crackles. Normal respiratory effort.  Cardiovascular: Regular rate and rhythm, no murmurs / rubs / gallops. No LE edema. Abdomen: soft, no distention, no tenderness. Bowel sounds positive.   Data Reviewed: I have independently reviewed following labs and imaging studies   CBC Recent Labs  Lab 04/16/23 0936 04/17/23 1206 04/18/23 0442 04/19/23 0311  WBC 8.6 5.8 5.5 7.8  HGB 11.0* 11.0* 9.6* 10.2*  HCT 32.8* 33.7* 29.5* 31.4*  PLT 248 284 221 246  MCV 92.4 92.6  93.4 92.6  MCH 31.0 30.2 30.4 30.1  MCHC 33.5 32.6 32.5 32.5  RDW 14.2 14.2 14.2 14.3    Recent Labs  Lab 04/14/23 1557 04/16/23 0936 04/16/23 1026 04/16/23 1320 04/17/23 0256 04/17/23 1206 04/17/23 1831 04/18/23 0442 04/19/23 0311  NA  --  136  --   --  138  --   --  141 144  K 5.0 5.0  --   --  5.1  --   --  4.3 4.1  CL  --  101  --   --  102  --   --  107 112*  CO2  --  28  --   --  24  --   --  24 20*  GLUCOSE  --  137*  --   --  107*  --   --  123* 126*  BUN  --  52*  --   --  53*  --   --  62* 76*  CREATININE  --  1.44*  --   --  1.48*  --   --  1.90* 2.13*  CALCIUM  --  10.6*  --   --  10.0  --   --  9.9 10.1  AST  --  20  --   --   --   --   --  29 30  ALT  --  12  --   --   --   --   --  20 20  ALKPHOS  --  62  --   --   --   --   --  50 59  BILITOT  --  0.5  --   --   --   --   --  0.5 0.3  ALBUMIN  --  3.7  --   --   --   --   --  2.4* 2.6*  MG  --   --   --   --   --   --   --  2.3 2.5*  LATICACIDVEN  --  1.8  --  1.0  --   --   --   --   --   TSH  --   --   --   --   --  18.819*  --   --   --   HGBA1C  --   --   --   --   --   --  5.4  --   --   AMMONIA  --   --  20  --   --   --   --   --   --     ------------------------------------------------------------------------------------------------------------------ Recent Labs    04/17/23 1831  CHOL 129  HDL 44  LDLCALC 72  TRIG 64  CHOLHDL 2.9    Lab Results  Component Value Date   HGBA1C 5.4 04/17/2023   ------------------------------------------------------------------------------------------------------------------ Recent Labs    04/17/23 1206  TSH 18.819*    Cardiac Enzymes No results for input(s): "CKMB", "TROPONINI", "MYOGLOBIN" in the last 168 hours.  Invalid input(s): "CK" ------------------------------------------------------------------------------------------------------------------    Component Value Date/Time   BNP 61.6 12/10/2021 1749    CBG: Recent Labs  Lab  04/19/23 1548 04/19/23 2044 04/20/23 0005 04/20/23 0411 04/20/23 0733  GLUCAP 103* 103* 100* 72 81    Recent Results (from the past 240 hour(s))  Blood culture (routine x 2)     Status: None (Preliminary result)   Collection Time: 04/16/23  9:36 AM   Specimen: BLOOD  Result Value Ref Range Status   Specimen Description   Final    BLOOD RIGHT ANTECUBITAL Performed at [facility], [address] [place], [place] [address]    Special Requests   Final    BOTTLES DRAWN AEROBIC AND ANAEROBIC Blood Culture adequate volume Performed at [facility], [address] [place], [place] [address]    Culture   Final    NO GROWTH 4 DAYS Performed at [facility], [address], [place], [place] [address]    Report Status PENDING  Incomplete  Blood culture (routine x 2)     Status: None (Preliminary result)   Collection Time: 04/16/23 10:00 AM   Specimen: BLOOD RIGHT ARM  Result Value Ref Range Status   Specimen Description   Final  BLOOD RIGHT ARM Performed at [facility], [address], [place], [place] [address]    Special Requests   Final    BOTTLES DRAWN AEROBIC ONLY Blood Culture adequate volume Performed at [facility], [address], [place], [place] [address]    Culture   Final    NO GROWTH 4 DAYS Performed at [facility], [address], [place], [place] [address]    Report Status PENDING  Incomplete  Resp panel by RT-PCR (RSV, Flu A&B, Covid) Anterior Nasal Swab     Status: Abnormal   Collection Time: 04/16/23  2:59 PM   Specimen: Anterior Nasal Swab  Result Value Ref Range Status   SARS Coronavirus 2 by RT PCR POSITIVE (A) NEGATIVE Final    Comment: (NOTE) SARS-CoV-2 target nucleic acids are DETECTED.  The SARS-CoV-2 RNA is generally detectable in upper respiratory specimens during the acute phase of infection. Positive results are indicative of the presence of the identified  virus, but do not rule out bacterial infection or co-infection with other pathogens not detected by the test. Clinical correlation with patient history and other diagnostic information is necessary to determine patient infection status. The expected result is Negative.  Fact Sheet for Patients: BloggerCourse.com  Fact Sheet for Healthcare Providers: SeriousBroker.it  This test is not yet approved or cleared by the [place] FDA and  has been authorized for detection and/or diagnosis of SARS-CoV-2 by FDA under an Emergency Use Authorization (EUA).  This EUA will remain in effect (meaning this test can be used) for the duration of  the COVID-19 declaration under Section 564(b)(1) of the A ct, 21 U.S.C. section 360bbb-3(b)(1), unless the authorization is terminated or revoked sooner.     Influenza A by PCR NEGATIVE NEGATIVE Final   Influenza B by PCR NEGATIVE NEGATIVE Final    Comment: (NOTE) The Xpert Xpress SARS-CoV-2/FLU/RSV plus assay is intended as an aid in the diagnosis of influenza from Nasopharyngeal swab specimens and should not be used as a sole basis for treatment. Nasal washings and aspirates are unacceptable for Xpert Xpress SARS-CoV-2/FLU/RSV testing.  Fact Sheet for Patients: BloggerCourse.com  Fact Sheet for Healthcare Providers: SeriousBroker.it  This test is not yet approved or cleared by the [place] FDA and has been authorized for detection and/or diagnosis of SARS-CoV-2 by FDA under an Emergency Use Authorization (EUA). This EUA will remain in effect (meaning this test can be used) for the duration of the COVID-19 declaration under Section 564(b)(1) of the Act, 21 U.S.C. section 360bbb-3(b)(1), unless the authorization is terminated or revoked.     Resp Syncytial Virus by PCR NEGATIVE NEGATIVE Final    Comment: (NOTE) Fact Sheet for  Patients: BloggerCourse.com  Fact Sheet for Healthcare Providers: SeriousBroker.it  This test is not yet approved or cleared by the [place] FDA and has been authorized for detection and/or diagnosis of SARS-CoV-2 by FDA under an Emergency Use Authorization (EUA). This EUA will remain in effect (meaning this test can be used) for the duration of the COVID-19 declaration under Section 564(b)(1) of the Act, 21 U.S.C. section 360bbb-3(b)(1), unless the authorization is terminated or revoked.  Performed at [facility], [address] [place], [place] [address]   Gastrointestinal Panel by PCR , Stool     Status: Abnormal   Collection Time: 04/16/23  8:26 PM   Specimen: Stool  Result Value Ref Range Status   Campylobacter species NOT DETECTED NOT DETECTED Final   Plesimonas shigelloides  NOT DETECTED NOT DETECTED Final   Salmonella species DETECTED (A) NOT DETECTED Final    Comment: RESULT CALLED TO, READ BACK BY AND VERIFIED WITH: MAGGIE CHRISMON 1407 04/17/23 MU    Yersinia enterocolitica NOT DETECTED NOT DETECTED Final   Vibrio species NOT DETECTED NOT DETECTED Final   Vibrio cholerae NOT DETECTED NOT DETECTED Final   Enteroaggregative E coli (EAEC) NOT DETECTED NOT DETECTED Final   Enteropathogenic E coli (EPEC) NOT DETECTED NOT DETECTED Final   Enterotoxigenic E coli (ETEC) NOT DETECTED NOT DETECTED Final   Shiga like toxin producing E coli (STEC) NOT DETECTED NOT DETECTED Final   Shigella/Enteroinvasive E coli (EIEC) NOT DETECTED NOT DETECTED Final   Cryptosporidium NOT DETECTED NOT DETECTED Final   Cyclospora cayetanensis NOT DETECTED NOT DETECTED Final   Entamoeba histolytica NOT DETECTED NOT DETECTED Final   Giardia lamblia NOT DETECTED NOT DETECTED Final   Adenovirus F40/41 NOT DETECTED NOT DETECTED Final   Astrovirus NOT DETECTED NOT DETECTED Final   Norovirus GI/GII NOT DETECTED NOT DETECTED  Final   Rotavirus A NOT DETECTED NOT DETECTED Final   Sapovirus (I, II, IV, and V) NOT DETECTED NOT DETECTED Final    Comment: Performed at [name], [address] [place], [place] [address]  C Difficile Quick Screen w PCR reflex     Status: None   Collection Time: 04/16/23  8:26 PM   Specimen: Stool  Result Value Ref Range Status   C Diff antigen NEGATIVE NEGATIVE Final   C Diff toxin NEGATIVE NEGATIVE Final   C Diff interpretation No C. difficile detected.  Final    Comment: Performed at [name], [address] [place], [place] [address]     Radiology Studies: No results found.   Pamella Pert, MD, PhD [facility]  Between 7 am - 7 pm I am available, please contact me via Amion (for emergencies) or Securechat (non urgent messages)  Between 7 pm - 7 am I am not available, please contact night coverage MD/APP via Amion

## 2023-04-21 LAB — CULTURE, BLOOD (ROUTINE X 2)
Culture: NO GROWTH
Culture: NO GROWTH
Special Requests: ADEQUATE
Special Requests: ADEQUATE

## 2023-04-21 LAB — BASIC METABOLIC PANEL
Anion gap: 7 (ref 5–15)
BUN: 55 mg/dL — ABNORMAL HIGH (ref 8–23)
CO2: 24 mmol/L (ref 22–32)
Calcium: 9.6 mg/dL (ref 8.9–10.3)
Chloride: 114 mmol/L — ABNORMAL HIGH (ref 98–111)
Creatinine, Ser: 1.24 mg/dL (ref 0.61–1.24)
GFR, Estimated: 56 mL/min — ABNORMAL LOW (ref >60)
Glucose, Bld: 89 mg/dL (ref 70–99)
Potassium: 4.6 mmol/L (ref 3.5–5.1)
Sodium: 145 mmol/L (ref 135–145)

## 2023-04-21 LAB — GLUCOSE, CAPILLARY
Glucose-Capillary: 77 mg/dL (ref 70–99)
Glucose-Capillary: 86 mg/dL (ref 70–99)
Glucose-Capillary: 89 mg/dL (ref 70–99)
Glucose-Capillary: 100 mg/dL — ABNORMAL HIGH (ref 70–99)
Glucose-Capillary: 115 mg/dL — ABNORMAL HIGH (ref 70–99)

## 2023-04-21 LAB — CBC
HCT: 30.7 % — ABNORMAL LOW (ref 39.0–52.0)
Hemoglobin: 9.7 g/dL — ABNORMAL LOW (ref 13.0–17.0)
MCH: 29.4 pg (ref 26.0–34.0)
MCHC: 31.6 g/dL (ref 30.0–36.0)
MCV: 93 fL (ref 80.0–100.0)
Platelets: 224 10*3/uL (ref 150–400)
RBC: 3.3 MIL/uL — ABNORMAL LOW (ref 4.22–5.81)
RDW: 14.6 % (ref 11.5–15.5)
WBC: 6.6 10*3/uL (ref 4.0–10.5)
nRBC: 0 % (ref 0.0–0.2)

## 2023-04-21 LAB — MAGNESIUM: Magnesium: 2.1 mg/dL (ref 1.7–2.4)

## 2023-04-21 MED ORDER — CIPROFLOXACIN HCL 500 MG PO TABS: 500.0000 mg | ORAL_TABLET | Freq: Two times a day (BID) | ORAL | Status: DC

## 2023-04-21 MED ORDER — LOPERAMIDE HCL 1 MG/7.5ML PO SUSP
2.0000 mg | Freq: Every day | ORAL | Status: DC
  Administered 2023-04-21 – 2023-04-23 (×3): 2 mg
  Filled 2023-04-21 (×3): qty 15

## 2023-04-21 MED ORDER — ENOXAPARIN SODIUM 40 MG/0.4ML IJ SOSY
40.0000 mg | PREFILLED_SYRINGE | INTRAMUSCULAR | Status: DC
  Administered 2023-04-22 – 2023-04-23 (×2): 40 mg via SUBCUTANEOUS
  Filled 2023-04-21 (×2): qty 0.4

## 2023-04-21 MED ORDER — CIPROFLOXACIN HCL 500 MG PO TABS
500.0000 mg | ORAL_TABLET | Freq: Two times a day (BID) | ORAL | Status: DC
  Administered 2023-04-22 – 2023-04-23 (×2): 500 mg
  Filled 2023-04-21 (×3): qty 1

## 2023-04-21 MED ORDER — SACCHAROMYCES BOULARDII 250 MG PO CAPS
250.0000 mg | ORAL_CAPSULE | Freq: Two times a day (BID) | ORAL | Status: DC
  Administered 2023-04-21 – 2023-04-23 (×5): 250 mg
  Filled 2023-04-21 (×5): qty 1

## 2023-04-21 NOTE — Progress Notes (Signed)
PROGRESS NOTE  [name] [name]:[id number] DOB: [birth year] DOA: 04/16/2023 PCP: Marcine Matar, MD   LOS: 4 days   Brief Narrative / Interim history: 89 year old male with history of dementia, dysphagia status post PEG tube, HTN, hypothyroidism, BPH, anemia who comes into the hospital with nausea, vomiting, diarrhea.  This has been going on for the past 3 days, and has been weaker and had increased falls.  He was febrile in the ER 101.8, was found to be COVID-positive.  He had an episode of SVT while in the ED that lasted about 5 minutes.  Cardiology was consulted  Subjective / 24h Interval events: Comfortable, Afebrile, tolerating tube feeds  Assesement and Plan: Principal Problem:   COVID-19 virus infection Active Problems:   Hypothyroidism   Anemia, chronic disease   Essential hypertension   Dysphagia   Elevated troponin   AKI (acute kidney injury) (HCC)   Intractable nausea and vomiting   SVT (supraventricular tachycardia) (HCC)   PEG (percutaneous endoscopic gastrostomy) status (HCC)   COVID-19  Principal problem COVID-19-patient's symptoms have been going on for 3 days prior to admission.  Paxlovid unable to be used per tube -Remains with currently stable from COVID-19 standpoint  Active problems SVT-cardiology consulted, appreciate input.  Stable on metoprolol.  2D echo with normal LVEF 60 to 65%, grade 1 diastolic dysfunction -Continue to monitor on telemetry, no further issues  Nausea, vomiting, diarrhea due to Salmonella colitis-colitis with concern for systemic symptoms with high fever, hypotension on 10/21.  Started on ceftriaxone 10/21, fever resolved following initiation of antibiotics -Now tolerating tube feeds. -Switch to ciprofloxacin tomorrow for total of 7-day treatment -Discontinue Flexi-Seal today  AKI-likely in the setting of nausea, vomiting, diarrhea.  AKI resolved this morning  Elevated troponin-likely in the setting of SVT  Dysphagia-status post  PEG tube.  History of aspiration  Anemia of chronic disease-stable  Hypernatremia-sodium improved with increasing free water  Hypothyroidism-recent TSH of 39, PCP has increased his levothyroxine from 50-75 last week.  TSH improving now.  Continue current dose  Underlying dementia -seems to have a poor baseline, on feeding tubes, advanced dementia. PT consult pending  History of BPH-required In-N-Out cath, now has a Foley.  Voiding trial today  Scheduled Meds:  allopurinol  50 mg Per Tube QODAY   Chlorhexidine Gluconate Cloth  6 each Topical Daily   [START ON 04/22/2023] ciprofloxacin  500 mg Per Tube BID   enoxaparin (LOVENOX) injection  30 mg Subcutaneous Q24H   feeding supplement (PROSource TF20)  60 mL Per Tube Daily   ferrous sulfate  300 mg Per Tube QODAY   fiber supplement (BANATROL TF)  60 mL Per Tube BID   free water  200 mL Per Tube Q4H   levothyroxine  75 mcg Per Tube QAC breakfast   loperamide HCl  2 mg Per Tube Daily   metoprolol tartrate  12.5 mg Per Tube BID   multivitamin with minerals  1 tablet Per Tube Daily   mouth rinse  15 mL Mouth Rinse 4 times per day   saccharomyces boulardii  250 mg Per Tube BID   thiamine  100 mg Per Tube Daily   Continuous Infusions:  cefTRIAXone (ROCEPHIN)  IV Stopped (04/20/23 1558)   feeding supplement (JEVITY 1.5 CAL/FIBER) 1,000 mL (04/21/23 0211)   lactated ringers 100 mL/hr at 04/21/23 0520   PRN Meds:.acetaminophen, ondansetron (ZOFRAN) IV, mouth rinse  Current Outpatient Medications  Medication Instructions   allopurinol (ZYLOPRIM) 100 mg, Oral, Daily, Must keep appointment  in October 2024 for additional refills.   aspirin EC 81 mg, Oral, Daily, Swallow whole.   bisoprolol (ZEBETA) 5 MG tablet Take 1 tablet (5 mg total) by mouth once daily.   cetirizine (ZYRTEC) 10 mg, Oral, Daily, Must have office visit for refills   Emollient (EUCERIN INTENSIVE REPAIR) OINT 1 Units, Apply externally, 2 times daily, To dry skin twice  daily.   Iron, Ferrous Sulfate, 325 (65 Fe) MG TABS Take 1 tablet (325 mg) by mouth every other day. Please make an appointment with Dr. Laural Benes for more refills.   levothyroxine (SYNTHROID) 75 mcg, Oral, Daily before breakfast   triamcinolone cream (KENALOG) 0.1 % Apply 1 application topically to the affected area(s) 2 (two) times daily.   Water For Irrigation, Sterile (FREE WATER) SOLN 50 mL free water flush via PEG tube before and after each bolus    Diet Orders (From admission, onward)     Start     Ordered   04/16/23 2307  Diet NPO time specified  Diet effective now        04/16/23 2307            DVT prophylaxis: enoxaparin (LOVENOX) injection 30 mg Start: 04/18/23 1000   Lab Results  Component Value Date   PLT 224 04/21/2023      Code Status: Full Code  Family Communication: no family at bedside   Status is: Inpatient  Level of care: Progressive  Consultants:  Cardiology   Objective: Vitals:   04/21/23 0000 04/21/23 0400 04/21/23 0544 04/21/23 0819  BP: (!) 155/81  (!) 149/70 (!) 149/73  Pulse: 90   98  Resp: 15 14 15  (!) 22  Temp: 99.5 F (37.5 C)  98.7 F (37.1 C) 97.8 F (36.6 C)  TempSrc: Oral  Axillary Oral  SpO2: 99%  98% 100%  Weight:      Height:        Intake/Output Summary (Last 24 hours) at 04/21/2023 1150 Last data filed at 04/21/2023 1032 Gross per 24 hour  Intake 715.83 ml  Output 1470 ml  Net -754.17 ml   Wt Readings from Last 3 Encounters:  04/20/23 55.8 kg  04/10/23 54.1 kg  04/08/22 61.7 kg    Examination:  Constitutional: NAD Eyes: lids and conjunctivae normal, no scleral icterus ENMT: mmm Neck: normal, supple Respiratory: clear to auscultation bilaterally, no wheezing, no crackles. Normal respiratory effort.  Cardiovascular: Regular rate and rhythm, no murmurs / rubs / gallops. No LE edema. Abdomen: soft, no distention, no tenderness. Bowel sounds positive.   Data Reviewed: I have independently reviewed following  labs and imaging studies   CBC Recent Labs  Lab 04/16/23 0936 04/17/23 1206 04/18/23 0442 04/19/23 0311 04/21/23 0338  WBC 8.6 5.8 5.5 7.8 6.6  HGB 11.0* 11.0* 9.6* 10.2* 9.7*  HCT 32.8* 33.7* 29.5* 31.4* 30.7*  PLT 248 284 221 246 224  MCV 92.4 92.6 93.4 92.6 93.0  MCH 31.0 30.2 30.4 30.1 29.4  MCHC 33.5 32.6 32.5 32.5 31.6  RDW 14.2 14.2 14.2 14.3 14.6    Recent Labs  Lab 04/16/23 0936 04/16/23 1026 04/16/23 1320 04/17/23 0256 04/17/23 1206 04/17/23 1831 04/18/23 0442 04/19/23 0311 04/20/23 0952 04/21/23 0338  NA 136  --   --  138  --   --  141 144 148* 145  K 5.0  --   --  5.1  --   --  4.3 4.1 3.9 4.6  CL 101  --   --  102  --   --  107 112* 116* 114*  CO2 28  --   --  24  --   --  24 20* 23 24  GLUCOSE 137*  --   --  107*  --   --  123* 126* 82 89  BUN 52*  --   --  53*  --   --  62* 76* 66* 55*  CREATININE 1.44*  --   --  1.48*  --   --  1.90* 2.13* 1.58* 1.24  CALCIUM 10.6*  --   --  10.0  --   --  9.9 10.1 9.6 9.6  AST 20  --   --   --   --   --  29 30  --   --   ALT 12  --   --   --   --   --  20 20  --   --   ALKPHOS 62  --   --   --   --   --  50 59  --   --   BILITOT 0.5  --   --   --   --   --  0.5 0.3  --   --   ALBUMIN 3.7  --   --   --   --   --  2.4* 2.6*  --   --   MG  --   --   --   --   --   --  2.3 2.5*  --  2.1  LATICACIDVEN 1.8  --  1.0  --   --   --   --   --   --   --   TSH  --   --   --   --  18.819*  --   --   --   --   --   HGBA1C  --   --   --   --   --  5.4  --   --   --   --   AMMONIA  --  20  --   --   --   --   --   --   --   --     ------------------------------------------------------------------------------------------------------------------ No results for input(s): "CHOL", "HDL", "LDLCALC", "TRIG", "CHOLHDL", "LDLDIRECT" in the last 72 hours.   Lab Results  Component Value Date   HGBA1C 5.4 04/17/2023   ------------------------------------------------------------------------------------------------------------------ No  results for input(s): "TSH", "T4TOTAL", "T3FREE", "THYROIDAB" in the last 72 hours.  Invalid input(s): "FREET3"   Cardiac Enzymes No results for input(s): "CKMB", "TROPONINI", "MYOGLOBIN" in the last 168 hours.  Invalid input(s): "CK" ------------------------------------------------------------------------------------------------------------------    Component Value Date/Time   BNP 61.6 12/10/2021 1749    CBG: Recent Labs  Lab 04/20/23 1837 04/20/23 2056 04/21/23 0041 04/21/23 0409 04/21/23 1107  GLUCAP 90 89 86 77 100*    Recent Results (from the past 240 hour(s))  Blood culture (routine x 2)     Status: None   Collection Time: 04/16/23  9:36 AM   Specimen: BLOOD  Result Value Ref Range Status   Specimen Description   Final    BLOOD RIGHT ANTECUBITAL Performed at [facility], [address], [place], [place] [address]    Special Requests   Final    BOTTLES DRAWN AEROBIC AND ANAEROBIC Blood Culture adequate volume Performed at [facility], [address] [place], [place] [address]    Culture   Final    NO GROWTH 5 DAYS  Performed at [name], [address], [place], [place] [address]    Report Status 04/21/2023 FINAL  Final  Blood culture (routine x 2)     Status: None   Collection Time: 04/16/23 10:00 AM   Specimen: BLOOD RIGHT ARM  Result Value Ref Range Status   Specimen Description   Final    BLOOD RIGHT ARM Performed at [facility], [address], [place], [place] [address]    Special Requests   Final    BOTTLES DRAWN AEROBIC ONLY Blood Culture adequate volume Performed at [facility], [address] [place], [place] [address]    Culture   Final    NO GROWTH 5 DAYS Performed at [facility], [address], [place], [place] [address]    Report Status 04/21/2023 FINAL  Final  Resp panel by RT-PCR (RSV, Flu A&B, Covid) Anterior Nasal Swab     Status: Abnormal    Collection Time: 04/16/23  2:59 PM   Specimen: Anterior Nasal Swab  Result Value Ref Range Status   SARS Coronavirus 2 by RT PCR POSITIVE (A) NEGATIVE Final    Comment: (NOTE) SARS-CoV-2 target nucleic acids are DETECTED.  The SARS-CoV-2 RNA is generally detectable in upper respiratory specimens during the acute phase of infection. Positive results are indicative of the presence of the identified virus, but do not rule out bacterial infection or co-infection with other pathogens not detected by the test. Clinical correlation with patient history and other diagnostic information is necessary to determine patient infection status. The expected result is Negative.  Fact Sheet for Patients: BloggerCourse.com  Fact Sheet for Healthcare Providers: SeriousBroker.it  This test is not yet approved or cleared by the [place] FDA and  has been authorized for detection and/or diagnosis of SARS-CoV-2 by FDA under an Emergency Use Authorization (EUA).  This EUA will remain in effect (meaning this test can be used) for the duration of  the COVID-19 declaration under Section 564(b)(1) of the A ct, 21 U.S.C. section 360bbb-3(b)(1), unless the authorization is terminated or revoked sooner.     Influenza A by PCR NEGATIVE NEGATIVE Final   Influenza B by PCR NEGATIVE NEGATIVE Final    Comment: (NOTE) The Xpert Xpress SARS-CoV-2/FLU/RSV plus assay is intended as an aid in the diagnosis of influenza from Nasopharyngeal swab specimens and should not be used as a sole basis for treatment. Nasal washings and aspirates are unacceptable for Xpert Xpress SARS-CoV-2/FLU/RSV testing.  Fact Sheet for Patients: BloggerCourse.com  Fact Sheet for Healthcare Providers: SeriousBroker.it  This test is not yet approved or cleared by the [place] FDA and has been authorized for detection and/or  diagnosis of SARS-CoV-2 by FDA under an Emergency Use Authorization (EUA). This EUA will remain in effect (meaning this test can be used) for the duration of the COVID-19 declaration under Section 564(b)(1) of the Act, 21 U.S.C. section 360bbb-3(b)(1), unless the authorization is terminated or revoked.     Resp Syncytial Virus by PCR NEGATIVE NEGATIVE Final    Comment: (NOTE) Fact Sheet for Patients: BloggerCourse.com  Fact Sheet for Healthcare Providers: SeriousBroker.it  This test is not yet approved or cleared by the [place] FDA and has been authorized for detection and/or diagnosis of SARS-CoV-2 by FDA under an Emergency Use Authorization (EUA). This EUA will remain in effect (meaning this test can be used) for the duration of the COVID-19 declaration under Section 564(b)(1) of the Act, 21 U.S.C. section 360bbb-3(b)(1), unless  the authorization is terminated or revoked.  Performed at [facility], [address], [place], [place] [address]   Gastrointestinal Panel by PCR , Stool     Status: Abnormal   Collection Time: 04/16/23  8:26 PM   Specimen: Stool  Result Value Ref Range Status   Campylobacter species NOT DETECTED NOT DETECTED Final   Plesimonas shigelloides NOT DETECTED NOT DETECTED Final   Salmonella species DETECTED (A) NOT DETECTED Final    Comment: RESULT CALLED TO, READ BACK BY AND VERIFIED WITH: MAGGIE CHRISMON 1407 04/17/23 MU    Yersinia enterocolitica NOT DETECTED NOT DETECTED Final   Vibrio species NOT DETECTED NOT DETECTED Final   Vibrio cholerae NOT DETECTED NOT DETECTED Final   Enteroaggregative E coli (EAEC) NOT DETECTED NOT DETECTED Final   Enteropathogenic E coli (EPEC) NOT DETECTED NOT DETECTED Final   Enterotoxigenic E coli (ETEC) NOT DETECTED NOT DETECTED Final   Shiga like toxin producing E coli (STEC) NOT DETECTED NOT DETECTED Final   Shigella/Enteroinvasive E coli  (EIEC) NOT DETECTED NOT DETECTED Final   Cryptosporidium NOT DETECTED NOT DETECTED Final   Cyclospora cayetanensis NOT DETECTED NOT DETECTED Final   Entamoeba histolytica NOT DETECTED NOT DETECTED Final   Giardia lamblia NOT DETECTED NOT DETECTED Final   Adenovirus F40/41 NOT DETECTED NOT DETECTED Final   Astrovirus NOT DETECTED NOT DETECTED Final   Norovirus GI/GII NOT DETECTED NOT DETECTED Final   Rotavirus A NOT DETECTED NOT DETECTED Final   Sapovirus (I, II, IV, and V) NOT DETECTED NOT DETECTED Final    Comment: Performed at [facility], [address] [place], [place] [address]  C Difficile Quick Screen w PCR reflex     Status: None   Collection Time: 04/16/23  8:26 PM   Specimen: Stool  Result Value Ref Range Status   C Diff antigen NEGATIVE NEGATIVE Final   C Diff toxin NEGATIVE NEGATIVE Final   C Diff interpretation No C. difficile detected.  Final    Comment: Performed at [facility], [address] [place], [place] [address]     Radiology Studies: No results found.   Pamella Pert, MD, PhD [facility]  Between 7 am - 7 pm I am available, please contact me via Amion (for emergencies) or Securechat (non urgent messages)  Between 7 pm - 7 am I am not available, please contact night coverage MD/APP via Amion

## 2023-04-21 NOTE — Plan of Care (Signed)

## 2023-04-22 LAB — GLUCOSE, CAPILLARY
Glucose-Capillary: 112 mg/dL — ABNORMAL HIGH (ref 70–99)
Glucose-Capillary: 113 mg/dL — ABNORMAL HIGH (ref 70–99)
Glucose-Capillary: 92 mg/dL (ref 70–99)
Glucose-Capillary: 99 mg/dL (ref 70–99)

## 2023-04-22 LAB — BASIC METABOLIC PANEL
Anion gap: 7 (ref 5–15)
BUN: 55 mg/dL — ABNORMAL HIGH (ref 8–23)
CO2: 24 mmol/L (ref 22–32)
Calcium: 9.3 mg/dL (ref 8.9–10.3)
Chloride: 111 mmol/L (ref 98–111)
Creatinine, Ser: 1.13 mg/dL (ref 0.61–1.24)
GFR, Estimated: >60 mL/min (ref >60)
Glucose, Bld: 118 mg/dL — ABNORMAL HIGH (ref 70–99)
Potassium: 4.3 mmol/L (ref 3.5–5.1)
Sodium: 142 mmol/L (ref 135–145)

## 2023-04-22 NOTE — Progress Notes (Signed)
PROGRESS NOTE  [name] [name] WGN:[id number] DOB: 03-25-1934 DOA: 04/16/2023 PCP: Marcine Matar, MD   LOS: 5 days   Brief Narrative / Interim history: 89 year old male with history of dementia, dysphagia status post PEG tube, HTN, hypothyroidism, BPH, anemia who comes into the hospital with nausea, vomiting, diarrhea.  This has been going on for the past 3 days, and has been weaker and had increased falls.  He was febrile in the ER 101.8, was found to be COVID-positive.  He had an episode of SVT while in the ED that lasted about 5 minutes.  Cardiology was consulted  Subjective / 24h Interval events: Comfortable, Afebrile, tolerating tube feeds  Assesement and Plan: Principal Problem:   COVID-19 virus infection Active Problems:   Hypothyroidism   Anemia, chronic disease   Essential hypertension   Dysphagia   Elevated troponin   AKI (acute kidney injury) (HCC)   Intractable nausea and vomiting   SVT (supraventricular tachycardia) (HCC)   PEG (percutaneous endoscopic gastrostomy) status (HCC)   COVID-19  Principal problem COVID-19-patient's symptoms have been going on for 3 days prior to admission.  Paxlovid unable to be used per tube -Remains with currently stable from COVID-19 standpoint, on room air, afebrile  Active problems SVT-cardiology consulted, appreciate input.  Stable on metoprolol.  2D echo with normal LVEF 60 to 65%, grade 1 diastolic dysfunction -Continue to monitor on telemetry, no further issues  Nausea, vomiting, diarrhea due to Salmonella colitis-colitis with concern for systemic symptoms with high fever, hypotension on 10/21.  Started on ceftriaxone 10/21, fever resolved following initiation of antibiotics -Now tolerating tube feeds. -Switch to ciprofloxacin today for total of 7-day treatment, 2 days remaining -Still discontinued, no BMs overnight.  Looks like he is starting to form  AKI-likely in the setting of nausea, vomiting, diarrhea.  AKI now  resolved  Elevated troponin-likely in the setting of SVT  Dysphagia-status post PEG tube.  History of aspiration  Anemia of chronic disease-stable  Hypernatremia-sodium improved with increasing free water  Hypothyroidism-recent TSH of 39, PCP has increased his levothyroxine from 50-75 last week.  TSH improving now.  Continue current dose  Underlying dementia -seems to have a poor baseline, on feeding tubes, advanced dementia. PT consult pending  History of BPH-required In-N-Out cath, now has a Foley.  Remove Foley catheter today  Scheduled Meds:  allopurinol  50 mg Per Tube QODAY   Chlorhexidine Gluconate Cloth  6 each Topical Daily   ciprofloxacin  500 mg Per Tube BID   enoxaparin (LOVENOX) injection  40 mg Subcutaneous Q24H   feeding supplement (PROSource TF20)  60 mL Per Tube Daily   ferrous sulfate  300 mg Per Tube QODAY   fiber supplement (BANATROL TF)  60 mL Per Tube BID   free water  200 mL Per Tube Q4H   levothyroxine  75 mcg Per Tube QAC breakfast   loperamide HCl  2 mg Per Tube Daily   metoprolol tartrate  12.5 mg Per Tube BID   multivitamin with minerals  1 tablet Per Tube Daily   mouth rinse  15 mL Mouth Rinse 4 times per day   saccharomyces boulardii  250 mg Per Tube BID   thiamine  100 mg Per Tube Daily   Continuous Infusions:  feeding supplement (JEVITY 1.5 CAL/FIBER) 1,000 mL (04/21/23 2210)   PRN Meds:.acetaminophen, ondansetron (ZOFRAN) IV, mouth rinse  Current Outpatient Medications  Medication Instructions   allopurinol (ZYLOPRIM) 100 mg, Oral, Daily, Must keep appointment in October 2024 for  additional refills.   aspirin EC 81 mg, Oral, Daily, Swallow whole.   bisoprolol (ZEBETA) 5 MG tablet Take 1 tablet (5 mg total) by mouth once daily.   cetirizine (ZYRTEC) 10 mg, Oral, Daily, Must have office visit for refills   Emollient (EUCERIN INTENSIVE REPAIR) OINT 1 Units, Apply externally, 2 times daily, To dry skin twice daily.   Iron, Ferrous Sulfate, 325  (65 Fe) MG TABS Take 1 tablet (325 mg) by mouth every other day. Please make an appointment with Dr. Laural Benes for more refills.   levothyroxine (SYNTHROID) 75 mcg, Oral, Daily before breakfast   triamcinolone cream (KENALOG) 0.1 % Apply 1 application topically to the affected area(s) 2 (two) times daily.   Water For Irrigation, Sterile (FREE WATER) SOLN 50 mL free water flush via PEG tube before and after each bolus    Diet Orders (From admission, onward)     Start     Ordered   04/16/23 2307  Diet NPO time specified  Diet effective now        04/16/23 2307            DVT prophylaxis: enoxaparin (LOVENOX) injection 40 mg Start: 04/22/23 1000   Lab Results  Component Value Date   PLT 224 04/21/2023      Code Status: Full Code  Family Communication: no family at bedside   Status is: Inpatient  Level of care: Progressive  Consultants:  Cardiology   Objective: Vitals:   04/22/23 0428 04/22/23 0441 04/22/23 0802 04/22/23 1242  BP:   129/70 (!) 131/55  Pulse: 100  (!) 103 100  Resp: 17  15 17   Temp: 98 F (36.7 C)  98 F (36.7 C) 98 F (36.7 C)  TempSrc:   Oral Oral  SpO2: 100%  100% 100%  Weight:  55 kg    Height:        Intake/Output Summary (Last 24 hours) at 04/22/2023 1248 Last data filed at 04/22/2023 0941 Gross per 24 hour  Intake 300 ml  Output 2075 ml  Net -1775 ml   Wt Readings from Last 3 Encounters:  04/22/23 55 kg  04/10/23 54.1 kg  04/08/22 61.7 kg    Examination:  Constitutional: NAD Eyes: lids and conjunctivae normal, no scleral icterus ENMT: mmm Neck: normal, supple Respiratory: clear to auscultation bilaterally, no wheezing, no crackles. Normal respiratory effort.  Cardiovascular: Regular rate and rhythm, no murmurs / rubs / gallops. No LE edema. Abdomen: soft, no distention, no tenderness. Bowel sounds positive.   Data Reviewed: I have independently reviewed following labs and imaging studies   CBC Recent Labs  Lab  04/16/23 0936 04/17/23 1206 04/18/23 0442 04/19/23 0311 04/21/23 0338  WBC 8.6 5.8 5.5 7.8 6.6  HGB 11.0* 11.0* 9.6* 10.2* 9.7*  HCT 32.8* 33.7* 29.5* 31.4* 30.7*  PLT 248 284 221 246 224  MCV 92.4 92.6 93.4 92.6 93.0  MCH 31.0 30.2 30.4 30.1 29.4  MCHC 33.5 32.6 32.5 32.5 31.6  RDW 14.2 14.2 14.2 14.3 14.6    Recent Labs  Lab 04/16/23 0936 04/16/23 1026 04/16/23 1320 04/17/23 0256 04/17/23 1206 04/17/23 1831 04/18/23 0442 04/19/23 0311 04/20/23 0952 04/21/23 0338 04/22/23 0254  NA 136  --   --    < >  --   --  141 144 148* 145 142  K 5.0  --   --    < >  --   --  4.3 4.1 3.9 4.6 4.3  CL 101  --   --    < >  --   --  107 112* 116* 114* 111  CO2 28  --   --    < >  --   --  24 20* 23 24 24   GLUCOSE 137*  --   --    < >  --   --  123* 126* 82 89 118*  BUN 52*  --   --    < >  --   --  62* 76* 66* 55* 55*  CREATININE 1.44*  --   --    < >  --   --  1.90* 2.13* 1.58* 1.24 1.13  CALCIUM 10.6*  --   --    < >  --   --  9.9 10.1 9.6 9.6 9.3  AST 20  --   --   --   --   --  29 30  --   --   --   ALT 12  --   --   --   --   --  20 20  --   --   --   ALKPHOS 62  --   --   --   --   --  50 59  --   --   --   BILITOT 0.5  --   --   --   --   --  0.5 0.3  --   --   --   ALBUMIN 3.7  --   --   --   --   --  2.4* 2.6*  --   --   --   MG  --   --   --   --   --   --  2.3 2.5*  --  2.1  --   LATICACIDVEN 1.8  --  1.0  --   --   --   --   --   --   --   --   TSH  --   --   --   --  18.819*  --   --   --   --   --   --   HGBA1C  --   --   --   --   --  5.4  --   --   --   --   --   AMMONIA  --  20  --   --   --   --   --   --   --   --   --    < > = values in this interval not displayed.    ------------------------------------------------------------------------------------------------------------------ No results for input(s): "CHOL", "HDL", "LDLCALC", "TRIG", "CHOLHDL", "LDLDIRECT" in the last 72 hours.   Lab Results  Component Value Date   HGBA1C 5.4 04/17/2023    ------------------------------------------------------------------------------------------------------------------ No results for input(s): "TSH", "T4TOTAL", "T3FREE", "THYROIDAB" in the last 72 hours.  Invalid input(s): "FREET3"   Cardiac Enzymes No results for input(s): "CKMB", "TROPONINI", "MYOGLOBIN" in the last 168 hours.  Invalid input(s): "CK" ------------------------------------------------------------------------------------------------------------------    Component Value Date/Time   BNP 61.6 12/10/2021 1749    CBG: Recent Labs  Lab 04/21/23 1107 04/21/23 1808 04/22/23 0027 04/22/23 0613 04/22/23 1243  GLUCAP 100* 115* 92 113* 99    Recent Results (from the past 240 hour(s))  Blood culture (routine x 2)     Status: None   Collection Time: 04/16/23  9:36 AM   Specimen: BLOOD  Result Value Ref Range Status   Specimen Description   Final  BLOOD RIGHT ANTECUBITAL Performed at [facility], [address], [place], [place] [address]    Special Requests   Final    BOTTLES DRAWN AEROBIC AND ANAEROBIC Blood Culture adequate volume Performed at [facility], [address], [place], [place] [address]    Culture   Final    NO GROWTH 5 DAYS Performed at [facility], [address] [place], [place] [address]    Report Status 04/21/2023 FINAL  Final  Blood culture (routine x 2)     Status: None   Collection Time: 04/16/23 10:00 AM   Specimen: BLOOD RIGHT ARM  Result Value Ref Range Status   Specimen Description   Final    BLOOD RIGHT ARM Performed at [facility], [address], [place], [place] [address]    Special Requests   Final    BOTTLES DRAWN AEROBIC ONLY Blood Culture adequate volume Performed at [facility], [address] [place], [place] [address]    Culture   Final    NO GROWTH 5 DAYS Performed at [facility], [address], [place], [place] [address]     Report Status 04/21/2023 FINAL  Final  Resp panel by RT-PCR (RSV, Flu A&B, Covid) Anterior Nasal Swab     Status: Abnormal   Collection Time: 04/16/23  2:59 PM   Specimen: Anterior Nasal Swab  Result Value Ref Range Status   SARS Coronavirus 2 by RT PCR POSITIVE (A) NEGATIVE Final    Comment: (NOTE) SARS-CoV-2 target nucleic acids are DETECTED.  The SARS-CoV-2 RNA is generally detectable in upper respiratory specimens during the acute phase of infection. Positive results are indicative of the presence of the identified virus, but do not rule out bacterial infection or co-infection with other pathogens not detected by the test. Clinical correlation with patient history and other diagnostic information is necessary to determine patient infection status. The expected result is Negative.  Fact Sheet for Patients: BloggerCourse.com  Fact Sheet for Healthcare Providers: SeriousBroker.it  This test is not yet approved or cleared by the [place] FDA and  has been authorized for detection and/or diagnosis of SARS-CoV-2 by FDA under an Emergency Use Authorization (EUA).  This EUA will remain in effect (meaning this test can be used) for the duration of  the COVID-19 declaration under Section 564(b)(1) of the A ct, 21 U.S.C. section 360bbb-3(b)(1), unless the authorization is terminated or revoked sooner.     Influenza A by PCR NEGATIVE NEGATIVE Final   Influenza B by PCR NEGATIVE NEGATIVE Final    Comment: (NOTE) The Xpert Xpress SARS-CoV-2/FLU/RSV plus assay is intended as an aid in the diagnosis of influenza from Nasopharyngeal swab specimens and should not be used as a sole basis for treatment. Nasal washings and aspirates are unacceptable for Xpert Xpress SARS-CoV-2/FLU/RSV testing.  Fact Sheet for Patients: BloggerCourse.com  Fact Sheet for Healthcare  Providers: SeriousBroker.it  This test is not yet approved or cleared by the [place] FDA and has been authorized for detection and/or diagnosis of SARS-CoV-2 by FDA under an Emergency Use Authorization (EUA). This EUA will remain in effect (meaning this test can be used) for the duration of the COVID-19 declaration under Section 564(b)(1) of the Act, 21 U.S.C. section 360bbb-3(b)(1), unless the authorization is terminated or revoked.     Resp Syncytial Virus by PCR NEGATIVE NEGATIVE Final    Comment: (NOTE) Fact Sheet for Patients: BloggerCourse.com  Fact Sheet for Healthcare Providers: SeriousBroker.it  This test is not yet approved or cleared by the [place] and has been authorized for detection and/or diagnosis of SARS-CoV-2 by FDA under an Emergency Use Authorization (EUA). This EUA will remain in effect (meaning this test can be used) for the duration of the COVID-19 declaration under Section 564(b)(1) of the Act, 21 U.S.C. section 360bbb-3(b)(1), unless the authorization is terminated or revoked.  Performed at [facility], [address], [place], [place] [address]   Gastrointestinal Panel by PCR , Stool     Status: Abnormal   Collection Time: 04/16/23  8:26 PM   Specimen: Stool  Result Value Ref Range Status   Campylobacter species NOT DETECTED NOT DETECTED Final   Plesimonas shigelloides NOT DETECTED NOT DETECTED Final   Salmonella species DETECTED (A) NOT DETECTED Final    Comment: RESULT CALLED TO, READ BACK BY AND VERIFIED WITH: MAGGIE CHRISMON 1407 04/17/23 MU    Yersinia enterocolitica NOT DETECTED NOT DETECTED Final   Vibrio species NOT DETECTED NOT DETECTED Final   Vibrio cholerae NOT DETECTED NOT DETECTED Final   Enteroaggregative E coli (EAEC) NOT DETECTED NOT DETECTED Final   Enteropathogenic E coli (EPEC) NOT DETECTED NOT DETECTED Final    Enterotoxigenic E coli (ETEC) NOT DETECTED NOT DETECTED Final   Shiga like toxin producing E coli (STEC) NOT DETECTED NOT DETECTED Final   Shigella/Enteroinvasive E coli (EIEC) NOT DETECTED NOT DETECTED Final   Cryptosporidium NOT DETECTED NOT DETECTED Final   Cyclospora cayetanensis NOT DETECTED NOT DETECTED Final   Entamoeba histolytica NOT DETECTED NOT DETECTED Final   Giardia lamblia NOT DETECTED NOT DETECTED Final   Adenovirus F40/41 NOT DETECTED NOT DETECTED Final   Astrovirus NOT DETECTED NOT DETECTED Final   Norovirus GI/GII NOT DETECTED NOT DETECTED Final   Rotavirus A NOT DETECTED NOT DETECTED Final   Sapovirus (I, II, IV, and V) NOT DETECTED NOT DETECTED Final    Comment: Performed at [facility], [address] [place], [place] [address]  C Difficile Quick Screen w PCR reflex     Status: None   Collection Time: 04/16/23  8:26 PM   Specimen: Stool  Result Value Ref Range Status   C Diff antigen NEGATIVE NEGATIVE Final   C Diff toxin NEGATIVE NEGATIVE Final   C Diff interpretation No C. difficile detected.  Final    Comment: Performed at [facility], [address], [place], [place] [address]     Radiology Studies: No results found.   Pamella Pert, MD, PhD [facility]  Between 7 am - 7 pm I am available, please contact me via Amion (for emergencies) or Securechat (non urgent messages)  Between 7 pm - 7 am I am not available, please contact night coverage MD/APP via Amion

## 2023-04-22 NOTE — Plan of Care (Signed)

## 2023-04-23 LAB — BASIC METABOLIC PANEL
Anion gap: 6 (ref 5–15)
BUN: 57 mg/dL — ABNORMAL HIGH (ref 8–23)
CO2: 25 mmol/L (ref 22–32)
Calcium: 8.9 mg/dL (ref 8.9–10.3)
Chloride: 106 mmol/L (ref 98–111)
Creatinine, Ser: 1.12 mg/dL (ref 0.61–1.24)
GFR, Estimated: >60 mL/min (ref >60)
Glucose, Bld: 109 mg/dL — ABNORMAL HIGH (ref 70–99)
Potassium: 5 mmol/L (ref 3.5–5.1)
Sodium: 137 mmol/L (ref 135–145)

## 2023-04-23 LAB — GLUCOSE, CAPILLARY
Glucose-Capillary: 119 mg/dL — ABNORMAL HIGH (ref 70–99)
Glucose-Capillary: 91 mg/dL (ref 70–99)

## 2023-04-23 MED ORDER — SACCHAROMYCES BOULARDII 250 MG PO CAPS
250.0000 mg | ORAL_CAPSULE | Freq: Two times a day (BID) | ORAL | 0 refills | Status: DC
  Filled 2023-04-23: qty 60, 30d supply, fill #0

## 2023-04-23 MED ORDER — CIPROFLOXACIN HCL 500 MG PO TABS
500.0000 mg | ORAL_TABLET | Freq: Two times a day (BID) | ORAL | 0 refills | Status: AC
  Filled 2023-04-23: qty 2, 1d supply, fill #0

## 2023-04-23 MED ORDER — METOPROLOL TARTRATE 25 MG PO TABS
12.5000 mg | ORAL_TABLET | Freq: Two times a day (BID) | ORAL | 0 refills | Status: DC
  Filled 2023-04-23: qty 45, 45d supply, fill #0

## 2023-04-23 NOTE — Discharge Summary (Signed)
Physician Discharge Summary  [name] [name] ZDG:[id number] DOB: Dec 30, 1933 DOA: 04/16/2023  PCP: [name] Matar, MD  Admit date: 04/16/2023 Discharge date: 04/23/2023  Admitted From: home Disposition:  home (declines SNF)  Recommendations for Outpatient Follow-up:  Follow up with PCP in 1-2 weeks Please obtain BMP/CBC in one week  Home Health: none Equipment/Devices: none  Discharge Condition: stable CODE STATUS: Full code Diet Orders (From admission, onward)     Start     Ordered   04/16/23 2307  Diet NPO time specified  Diet effective now        04/16/23 2307            HPI: Per admitting MD, [name] [name] is a 89 y.o. male with medical history significant of dysphagia s/p PEG tube, hypertension, hypothyroidism, severe protein malnutrition, BPH, anemia who presents with nausea, vomiting and diarrhea. Pt speaks Arabic and has dementia. Son at bedside wants to translate for him. Says he has been having nausea, vomiting and diarrhea x 3 days. Had 3 falls since last night due to increase weakness. Finally came to ED because pt is incontinent and his diarrhea was too much for family to take care of.  On arrival to the ED, he was initially afebrile, tachycardic and normotensive.  However later developed a fever of 101.8 and was found to be COVID-positive. CBC was negative for leukocytosis, hemoglobin of 11. BMP notable for elevated creatinine of 1.44 from prior of 1.13. UA was negative. Troponin was elevated at 194 to 189. Patient reportedly had an episode of SVT at 170 bpm lasting for about 5 minutes that spontaneously resolved in the ED. EDP consulted cardiology and they recommended echocardiogram. Pt did not have any chest pain or shortness of breath.   Hospital Course / Discharge diagnoses: Principal Problem:   COVID-19 virus infection Active Problems:   Hypothyroidism   Anemia, chronic disease   Essential hypertension   Dysphagia   Elevated troponin   AKI (acute kidney  injury) (HCC)   Intractable nausea and vomiting   SVT (supraventricular tachycardia) (HCC)   PEG (percutaneous endoscopic gastrostomy) status (HCC)   COVID-19   Principal problem COVID-19-patient's symptoms have been going on for 3 days prior to admission.  Paxlovid unable to be used per tube.  He has remained stable from COVID-19 standpoint, on room air, he was febrile initially but believed to be due to Salmonella enteritis   Active problems SVT-cardiology consulted, appreciate input.  Stable on metoprolol.  2D echo with normal LVEF 60 to 65%, grade 1 diastolic dysfunction. No further issues  Sepsis due to Salmonella enteritis-patient met sepsis criteria with fever, tachycardia and a source. He was placed on Ceftriaxone with resolution of sepsis physiology. His nausea, vomiting have completely resolved.  He is not tolerating tube feeds and his diarrhea resolved as well.  He was switched to ciprofloxacin for total of a 7-day course, with 1 day remaining at the time of discharge. AKI-likely in the setting of nausea, vomiting, diarrhea.  AKI now resolved Elevated troponin-likely in the setting of SVT Dysphagia-status post PEG tube.  History of aspiration Anemia of chronic disease-stable Hypernatremia-sodium improved with increasing free water Hypothyroidism-recent TSH of 39, PCP has increased his levothyroxine from 50-75 last week.  TSH improving now.  Continue current dose Underlying dementia -seems to have a poor baseline, on feeding tubes, advanced dementia.  PT recommended SNF, however family wants to take him home History of BPH, acute urinary obstruction-likely in the setting of acute illness.  Physician Discharge Summary  [name] [name] ZDG:[id number] DOB: Dec 30, 1933 DOA: 04/16/2023  PCP: [name] Matar, MD  Admit date: 04/16/2023 Discharge date: 04/23/2023  Admitted From: home Disposition:  home (declines SNF)  Recommendations for Outpatient Follow-up:  Follow up with PCP in 1-2 weeks Please obtain BMP/CBC in one week  Home Health: none Equipment/Devices: none  Discharge Condition: stable CODE STATUS: Full code Diet Orders (From admission, onward)     Start     Ordered   04/16/23 2307  Diet NPO time specified  Diet effective now        04/16/23 2307            HPI: Per admitting MD, [name] [name] is a 89 y.o. male with medical history significant of dysphagia s/p PEG tube, hypertension, hypothyroidism, severe protein malnutrition, BPH, anemia who presents with nausea, vomiting and diarrhea. Pt speaks Arabic and has dementia. Son at bedside wants to translate for him. Says he has been having nausea, vomiting and diarrhea x 3 days. Had 3 falls since last night due to increase weakness. Finally came to ED because pt is incontinent and his diarrhea was too much for family to take care of.  On arrival to the ED, he was initially afebrile, tachycardic and normotensive.  However later developed a fever of 101.8 and was found to be COVID-positive. CBC was negative for leukocytosis, hemoglobin of 11. BMP notable for elevated creatinine of 1.44 from prior of 1.13. UA was negative. Troponin was elevated at 194 to 189. Patient reportedly had an episode of SVT at 170 bpm lasting for about 5 minutes that spontaneously resolved in the ED. EDP consulted cardiology and they recommended echocardiogram. Pt did not have any chest pain or shortness of breath.   Hospital Course / Discharge diagnoses: Principal Problem:   COVID-19 virus infection Active Problems:   Hypothyroidism   Anemia, chronic disease   Essential hypertension   Dysphagia   Elevated troponin   AKI (acute kidney  injury) (HCC)   Intractable nausea and vomiting   SVT (supraventricular tachycardia) (HCC)   PEG (percutaneous endoscopic gastrostomy) status (HCC)   COVID-19   Principal problem COVID-19-patient's symptoms have been going on for 3 days prior to admission.  Paxlovid unable to be used per tube.  He has remained stable from COVID-19 standpoint, on room air, he was febrile initially but believed to be due to Salmonella enteritis   Active problems SVT-cardiology consulted, appreciate input.  Stable on metoprolol.  2D echo with normal LVEF 60 to 65%, grade 1 diastolic dysfunction. No further issues  Sepsis due to Salmonella enteritis-patient met sepsis criteria with fever, tachycardia and a source. He was placed on Ceftriaxone with resolution of sepsis physiology. His nausea, vomiting have completely resolved.  He is not tolerating tube feeds and his diarrhea resolved as well.  He was switched to ciprofloxacin for total of a 7-day course, with 1 day remaining at the time of discharge. AKI-likely in the setting of nausea, vomiting, diarrhea.  AKI now resolved Elevated troponin-likely in the setting of SVT Dysphagia-status post PEG tube.  History of aspiration Anemia of chronic disease-stable Hypernatremia-sodium improved with increasing free water Hypothyroidism-recent TSH of 39, PCP has increased his levothyroxine from 50-75 last week.  TSH improving now.  Continue current dose Underlying dementia -seems to have a poor baseline, on feeding tubes, advanced dementia.  PT recommended SNF, however family wants to take him home History of BPH, acute urinary obstruction-likely in the setting of acute illness.  Once he improved, Foley was discontinued and he is now able to void on his own.  Sepsis ruled out   Discharge Instructions   Allergies as of 04/23/2023   No Known Allergies      Medication List     STOP taking these medications    bisoprolol 5 MG tablet Commonly known as: ZEBETA    cetirizine 10 MG tablet Commonly known as: ZYRTEC   Eucerin Intensive Repair Oint   triamcinolone cream 0.1 % Commonly known as: KENALOG       TAKE these medications    allopurinol 100 MG tablet Commonly known as: ZYLOPRIM Take 1 tablet (100 mg total) by mouth daily. Must keep appointment in October 2024 for additional refills.   aspirin EC 81 MG tablet Take 1 tablet (81 mg total) by mouth daily. Swallow whole.   ciprofloxacin 500 MG tablet Commonly known as: CIPRO Place 1 tablet (500 mg total) into feeding tube 2 (two) times daily for 1 day.   free water Soln 50 mL free water flush via PEG tube before and after each bolus   Iron (Ferrous Sulfate) 325 (65 Fe) MG Tabs Take 1 tablet (325 mg) by mouth every other day. Please make an appointment with Dr. Laural Benes for more refills.   levothyroxine 75 MCG tablet Commonly known as: SYNTHROID Take 1 tablet (75 mcg total) by mouth daily before breakfast.   metoprolol tartrate 25 MG tablet Commonly known as: LOPRESSOR Place 0.5 tablets (12.5 mg total) into feeding tube 2 (two) times daily.   saccharomyces boulardii 250 MG capsule Commonly known as: FLORASTOR Place 1 capsule (250 mg total) into feeding tube 2 (two) times daily.       Consultations: Cardiology   Procedures/Studies:  ECHOCARDIOGRAM COMPLETE  Result Date: 04/17/2023    ECHOCARDIOGRAM REPORT   Patient Name:   ROGIE MILUM  Date of Exam: 04/17/2023 Medical Rec #:  [id number]  Height:       72.0 in Accession #:    [id number] Weight:       115.3 lb Date of Birth:  [birth year]   BSA:          1.686 m Patient Age:    89 years   BP:           96/51 mmHg Patient Gender: M          HR:           115 bpm. Exam Location:  Inpatient Procedure: 2D Echo, Color Doppler and Cardiac Doppler Indications:    R94.31 Abnormal EKG  History:        Patient has no prior history of Echocardiogram examinations.                 Risk Factors:Hypertension.  Sonographer:    Irving Burton Senior RDCS  Referring Phys: [id number] CHING T TU  Sonographer Comments: Windows limited by thin body habitus. IMPRESSIONS  1. Left ventricular ejection fraction, by estimation, is 60 to 65%. The left ventricle has normal function. The left ventricle has no regional wall motion abnormalities. There is moderate concentric left ventricular hypertrophy. Left ventricular diastolic parameters are consistent with Grade I diastolic dysfunction (impaired relaxation).  2. Right ventricular systolic function is normal. The right ventricular size is normal. Tricuspid regurgitation signal is inadequate for assessing PA pressure.  3. The mitral valve is normal in structure. Trivial mitral valve regurgitation.  4. The aortic valve is tricuspid. There is mild calcification of the aortic valve. Aortic valve regurgitation is  Physician Discharge Summary  [name] [name] ZDG:[id number] DOB: Dec 30, 1933 DOA: 04/16/2023  PCP: [name] Matar, MD  Admit date: 04/16/2023 Discharge date: 04/23/2023  Admitted From: home Disposition:  home (declines SNF)  Recommendations for Outpatient Follow-up:  Follow up with PCP in 1-2 weeks Please obtain BMP/CBC in one week  Home Health: none Equipment/Devices: none  Discharge Condition: stable CODE STATUS: Full code Diet Orders (From admission, onward)     Start     Ordered   04/16/23 2307  Diet NPO time specified  Diet effective now        04/16/23 2307            HPI: Per admitting MD, [name] [name] is a 89 y.o. male with medical history significant of dysphagia s/p PEG tube, hypertension, hypothyroidism, severe protein malnutrition, BPH, anemia who presents with nausea, vomiting and diarrhea. Pt speaks Arabic and has dementia. Son at bedside wants to translate for him. Says he has been having nausea, vomiting and diarrhea x 3 days. Had 3 falls since last night due to increase weakness. Finally came to ED because pt is incontinent and his diarrhea was too much for family to take care of.  On arrival to the ED, he was initially afebrile, tachycardic and normotensive.  However later developed a fever of 101.8 and was found to be COVID-positive. CBC was negative for leukocytosis, hemoglobin of 11. BMP notable for elevated creatinine of 1.44 from prior of 1.13. UA was negative. Troponin was elevated at 194 to 189. Patient reportedly had an episode of SVT at 170 bpm lasting for about 5 minutes that spontaneously resolved in the ED. EDP consulted cardiology and they recommended echocardiogram. Pt did not have any chest pain or shortness of breath.   Hospital Course / Discharge diagnoses: Principal Problem:   COVID-19 virus infection Active Problems:   Hypothyroidism   Anemia, chronic disease   Essential hypertension   Dysphagia   Elevated troponin   AKI (acute kidney  injury) (HCC)   Intractable nausea and vomiting   SVT (supraventricular tachycardia) (HCC)   PEG (percutaneous endoscopic gastrostomy) status (HCC)   COVID-19   Principal problem COVID-19-patient's symptoms have been going on for 3 days prior to admission.  Paxlovid unable to be used per tube.  He has remained stable from COVID-19 standpoint, on room air, he was febrile initially but believed to be due to Salmonella enteritis   Active problems SVT-cardiology consulted, appreciate input.  Stable on metoprolol.  2D echo with normal LVEF 60 to 65%, grade 1 diastolic dysfunction. No further issues  Sepsis due to Salmonella enteritis-patient met sepsis criteria with fever, tachycardia and a source. He was placed on Ceftriaxone with resolution of sepsis physiology. His nausea, vomiting have completely resolved.  He is not tolerating tube feeds and his diarrhea resolved as well.  He was switched to ciprofloxacin for total of a 7-day course, with 1 day remaining at the time of discharge. AKI-likely in the setting of nausea, vomiting, diarrhea.  AKI now resolved Elevated troponin-likely in the setting of SVT Dysphagia-status post PEG tube.  History of aspiration Anemia of chronic disease-stable Hypernatremia-sodium improved with increasing free water Hypothyroidism-recent TSH of 39, PCP has increased his levothyroxine from 50-75 last week.  TSH improving now.  Continue current dose Underlying dementia -seems to have a poor baseline, on feeding tubes, advanced dementia.  PT recommended SNF, however family wants to take him home History of BPH, acute urinary obstruction-likely in the setting of acute illness.  Once he improved, Foley was discontinued and he is now able to void on his own.  Sepsis ruled out   Discharge Instructions   Allergies as of 04/23/2023   No Known Allergies      Medication List     STOP taking these medications    bisoprolol 5 MG tablet Commonly known as: ZEBETA    cetirizine 10 MG tablet Commonly known as: ZYRTEC   Eucerin Intensive Repair Oint   triamcinolone cream 0.1 % Commonly known as: KENALOG       TAKE these medications    allopurinol 100 MG tablet Commonly known as: ZYLOPRIM Take 1 tablet (100 mg total) by mouth daily. Must keep appointment in October 2024 for additional refills.   aspirin EC 81 MG tablet Take 1 tablet (81 mg total) by mouth daily. Swallow whole.   ciprofloxacin 500 MG tablet Commonly known as: CIPRO Place 1 tablet (500 mg total) into feeding tube 2 (two) times daily for 1 day.   free water Soln 50 mL free water flush via PEG tube before and after each bolus   Iron (Ferrous Sulfate) 325 (65 Fe) MG Tabs Take 1 tablet (325 mg) by mouth every other day. Please make an appointment with Dr. Laural Benes for more refills.   levothyroxine 75 MCG tablet Commonly known as: SYNTHROID Take 1 tablet (75 mcg total) by mouth daily before breakfast.   metoprolol tartrate 25 MG tablet Commonly known as: LOPRESSOR Place 0.5 tablets (12.5 mg total) into feeding tube 2 (two) times daily.   saccharomyces boulardii 250 MG capsule Commonly known as: FLORASTOR Place 1 capsule (250 mg total) into feeding tube 2 (two) times daily.       Consultations: Cardiology   Procedures/Studies:  ECHOCARDIOGRAM COMPLETE  Result Date: 04/17/2023    ECHOCARDIOGRAM REPORT   Patient Name:   ROGIE MILUM  Date of Exam: 04/17/2023 Medical Rec #:  [id number]  Height:       72.0 in Accession #:    [id number] Weight:       115.3 lb Date of Birth:  [birth year]   BSA:          1.686 m Patient Age:    89 years   BP:           96/51 mmHg Patient Gender: M          HR:           115 bpm. Exam Location:  Inpatient Procedure: 2D Echo, Color Doppler and Cardiac Doppler Indications:    R94.31 Abnormal EKG  History:        Patient has no prior history of Echocardiogram examinations.                 Risk Factors:Hypertension.  Sonographer:    Irving Burton Senior RDCS  Referring Phys: [id number] CHING T TU  Sonographer Comments: Windows limited by thin body habitus. IMPRESSIONS  1. Left ventricular ejection fraction, by estimation, is 60 to 65%. The left ventricle has normal function. The left ventricle has no regional wall motion abnormalities. There is moderate concentric left ventricular hypertrophy. Left ventricular diastolic parameters are consistent with Grade I diastolic dysfunction (impaired relaxation).  2. Right ventricular systolic function is normal. The right ventricular size is normal. Tricuspid regurgitation signal is inadequate for assessing PA pressure.  3. The mitral valve is normal in structure. Trivial mitral valve regurgitation.  4. The aortic valve is tricuspid. There is mild calcification of the aortic valve. Aortic valve regurgitation is  Physician Discharge Summary  [name] [name] ZDG:[id number] DOB: Dec 30, 1933 DOA: 04/16/2023  PCP: [name] Matar, MD  Admit date: 04/16/2023 Discharge date: 04/23/2023  Admitted From: home Disposition:  home (declines SNF)  Recommendations for Outpatient Follow-up:  Follow up with PCP in 1-2 weeks Please obtain BMP/CBC in one week  Home Health: none Equipment/Devices: none  Discharge Condition: stable CODE STATUS: Full code Diet Orders (From admission, onward)     Start     Ordered   04/16/23 2307  Diet NPO time specified  Diet effective now        04/16/23 2307            HPI: Per admitting MD, [name] [name] is a 89 y.o. male with medical history significant of dysphagia s/p PEG tube, hypertension, hypothyroidism, severe protein malnutrition, BPH, anemia who presents with nausea, vomiting and diarrhea. Pt speaks Arabic and has dementia. Son at bedside wants to translate for him. Says he has been having nausea, vomiting and diarrhea x 3 days. Had 3 falls since last night due to increase weakness. Finally came to ED because pt is incontinent and his diarrhea was too much for family to take care of.  On arrival to the ED, he was initially afebrile, tachycardic and normotensive.  However later developed a fever of 101.8 and was found to be COVID-positive. CBC was negative for leukocytosis, hemoglobin of 11. BMP notable for elevated creatinine of 1.44 from prior of 1.13. UA was negative. Troponin was elevated at 194 to 189. Patient reportedly had an episode of SVT at 170 bpm lasting for about 5 minutes that spontaneously resolved in the ED. EDP consulted cardiology and they recommended echocardiogram. Pt did not have any chest pain or shortness of breath.   Hospital Course / Discharge diagnoses: Principal Problem:   COVID-19 virus infection Active Problems:   Hypothyroidism   Anemia, chronic disease   Essential hypertension   Dysphagia   Elevated troponin   AKI (acute kidney  injury) (HCC)   Intractable nausea and vomiting   SVT (supraventricular tachycardia) (HCC)   PEG (percutaneous endoscopic gastrostomy) status (HCC)   COVID-19   Principal problem COVID-19-patient's symptoms have been going on for 3 days prior to admission.  Paxlovid unable to be used per tube.  He has remained stable from COVID-19 standpoint, on room air, he was febrile initially but believed to be due to Salmonella enteritis   Active problems SVT-cardiology consulted, appreciate input.  Stable on metoprolol.  2D echo with normal LVEF 60 to 65%, grade 1 diastolic dysfunction. No further issues  Sepsis due to Salmonella enteritis-patient met sepsis criteria with fever, tachycardia and a source. He was placed on Ceftriaxone with resolution of sepsis physiology. His nausea, vomiting have completely resolved.  He is not tolerating tube feeds and his diarrhea resolved as well.  He was switched to ciprofloxacin for total of a 7-day course, with 1 day remaining at the time of discharge. AKI-likely in the setting of nausea, vomiting, diarrhea.  AKI now resolved Elevated troponin-likely in the setting of SVT Dysphagia-status post PEG tube.  History of aspiration Anemia of chronic disease-stable Hypernatremia-sodium improved with increasing free water Hypothyroidism-recent TSH of 39, PCP has increased his levothyroxine from 50-75 last week.  TSH improving now.  Continue current dose Underlying dementia -seems to have a poor baseline, on feeding tubes, advanced dementia.  PT recommended SNF, however family wants to take him home History of BPH, acute urinary obstruction-likely in the setting of acute illness.  Once he improved, Foley was discontinued and he is now able to void on his own.  Sepsis ruled out   Discharge Instructions   Allergies as of 04/23/2023   No Known Allergies      Medication List     STOP taking these medications    bisoprolol 5 MG tablet Commonly known as: ZEBETA    cetirizine 10 MG tablet Commonly known as: ZYRTEC   Eucerin Intensive Repair Oint   triamcinolone cream 0.1 % Commonly known as: KENALOG       TAKE these medications    allopurinol 100 MG tablet Commonly known as: ZYLOPRIM Take 1 tablet (100 mg total) by mouth daily. Must keep appointment in October 2024 for additional refills.   aspirin EC 81 MG tablet Take 1 tablet (81 mg total) by mouth daily. Swallow whole.   ciprofloxacin 500 MG tablet Commonly known as: CIPRO Place 1 tablet (500 mg total) into feeding tube 2 (two) times daily for 1 day.   free water Soln 50 mL free water flush via PEG tube before and after each bolus   Iron (Ferrous Sulfate) 325 (65 Fe) MG Tabs Take 1 tablet (325 mg) by mouth every other day. Please make an appointment with Dr. Laural Benes for more refills.   levothyroxine 75 MCG tablet Commonly known as: SYNTHROID Take 1 tablet (75 mcg total) by mouth daily before breakfast.   metoprolol tartrate 25 MG tablet Commonly known as: LOPRESSOR Place 0.5 tablets (12.5 mg total) into feeding tube 2 (two) times daily.   saccharomyces boulardii 250 MG capsule Commonly known as: FLORASTOR Place 1 capsule (250 mg total) into feeding tube 2 (two) times daily.       Consultations: Cardiology   Procedures/Studies:  ECHOCARDIOGRAM COMPLETE  Result Date: 04/17/2023    ECHOCARDIOGRAM REPORT   Patient Name:   ROGIE MILUM  Date of Exam: 04/17/2023 Medical Rec #:  [id number]  Height:       72.0 in Accession #:    [id number] Weight:       115.3 lb Date of Birth:  [birth year]   BSA:          1.686 m Patient Age:    89 years   BP:           96/51 mmHg Patient Gender: M          HR:           115 bpm. Exam Location:  Inpatient Procedure: 2D Echo, Color Doppler and Cardiac Doppler Indications:    R94.31 Abnormal EKG  History:        Patient has no prior history of Echocardiogram examinations.                 Risk Factors:Hypertension.  Sonographer:    Irving Burton Senior RDCS  Referring Phys: [id number] CHING T TU  Sonographer Comments: Windows limited by thin body habitus. IMPRESSIONS  1. Left ventricular ejection fraction, by estimation, is 60 to 65%. The left ventricle has normal function. The left ventricle has no regional wall motion abnormalities. There is moderate concentric left ventricular hypertrophy. Left ventricular diastolic parameters are consistent with Grade I diastolic dysfunction (impaired relaxation).  2. Right ventricular systolic function is normal. The right ventricular size is normal. Tricuspid regurgitation signal is inadequate for assessing PA pressure.  3. The mitral valve is normal in structure. Trivial mitral valve regurgitation.  4. The aortic valve is tricuspid. There is mild calcification of the aortic valve. Aortic valve regurgitation is  Physician Discharge Summary  [name] [name] ZDG:[id number] DOB: Dec 30, 1933 DOA: 04/16/2023  PCP: [name] Matar, MD  Admit date: 04/16/2023 Discharge date: 04/23/2023  Admitted From: home Disposition:  home (declines SNF)  Recommendations for Outpatient Follow-up:  Follow up with PCP in 1-2 weeks Please obtain BMP/CBC in one week  Home Health: none Equipment/Devices: none  Discharge Condition: stable CODE STATUS: Full code Diet Orders (From admission, onward)     Start     Ordered   04/16/23 2307  Diet NPO time specified  Diet effective now        04/16/23 2307            HPI: Per admitting MD, [name] [name] is a 89 y.o. male with medical history significant of dysphagia s/p PEG tube, hypertension, hypothyroidism, severe protein malnutrition, BPH, anemia who presents with nausea, vomiting and diarrhea. Pt speaks Arabic and has dementia. Son at bedside wants to translate for him. Says he has been having nausea, vomiting and diarrhea x 3 days. Had 3 falls since last night due to increase weakness. Finally came to ED because pt is incontinent and his diarrhea was too much for family to take care of.  On arrival to the ED, he was initially afebrile, tachycardic and normotensive.  However later developed a fever of 101.8 and was found to be COVID-positive. CBC was negative for leukocytosis, hemoglobin of 11. BMP notable for elevated creatinine of 1.44 from prior of 1.13. UA was negative. Troponin was elevated at 194 to 189. Patient reportedly had an episode of SVT at 170 bpm lasting for about 5 minutes that spontaneously resolved in the ED. EDP consulted cardiology and they recommended echocardiogram. Pt did not have any chest pain or shortness of breath.   Hospital Course / Discharge diagnoses: Principal Problem:   COVID-19 virus infection Active Problems:   Hypothyroidism   Anemia, chronic disease   Essential hypertension   Dysphagia   Elevated troponin   AKI (acute kidney  injury) (HCC)   Intractable nausea and vomiting   SVT (supraventricular tachycardia) (HCC)   PEG (percutaneous endoscopic gastrostomy) status (HCC)   COVID-19   Principal problem COVID-19-patient's symptoms have been going on for 3 days prior to admission.  Paxlovid unable to be used per tube.  He has remained stable from COVID-19 standpoint, on room air, he was febrile initially but believed to be due to Salmonella enteritis   Active problems SVT-cardiology consulted, appreciate input.  Stable on metoprolol.  2D echo with normal LVEF 60 to 65%, grade 1 diastolic dysfunction. No further issues  Sepsis due to Salmonella enteritis-patient met sepsis criteria with fever, tachycardia and a source. He was placed on Ceftriaxone with resolution of sepsis physiology. His nausea, vomiting have completely resolved.  He is not tolerating tube feeds and his diarrhea resolved as well.  He was switched to ciprofloxacin for total of a 7-day course, with 1 day remaining at the time of discharge. AKI-likely in the setting of nausea, vomiting, diarrhea.  AKI now resolved Elevated troponin-likely in the setting of SVT Dysphagia-status post PEG tube.  History of aspiration Anemia of chronic disease-stable Hypernatremia-sodium improved with increasing free water Hypothyroidism-recent TSH of 39, PCP has increased his levothyroxine from 50-75 last week.  TSH improving now.  Continue current dose Underlying dementia -seems to have a poor baseline, on feeding tubes, advanced dementia.  PT recommended SNF, however family wants to take him home History of BPH, acute urinary obstruction-likely in the setting of acute illness.

## 2023-04-23 NOTE — Progress Notes (Signed)
Patient discharged home with son via wheelchair in stable condition.

## 2023-04-24 ENCOUNTER — Telehealth: Payer: Self-pay

## 2023-04-24 ENCOUNTER — Other Ambulatory Visit: Payer: Self-pay

## 2023-04-24 NOTE — Transitions of Care (Post Inpatient/ED Visit) (Signed)
04/24/2023  Name: [name] [name] MRN: [id number] DOB: 1933-09-19  Today's TOC FU Call Status: Today's TOC FU Call Status:: Successful TOC FU Call Completed TOC FU Call Complete Date: 04/24/23 Patient's Name and Date of Birth confirmed.  Transition Care Management Follow-up Telephone Call Date of Discharge: 04/23/23 Discharge Facility: [facility] Type of Discharge: Inpatient Admission Primary Inpatient Discharge Diagnosis:: COVID- 19 How have you been since you were released from the hospital?: Same (per [facility], his father is just weak from being in the hospital. Patient is uninsured. Godfrey Pick said they are waiting on a green card. In the meantime, he was agreeable to having the [employer] Counselor call him about applying for CAFA/ OC.) Any questions or concerns?: No  Items Reviewed: Did you receive and understand the discharge instructions provided?: Yes Medications obtained,verified, and reconciled?: Partial Review Completed Reason for Partial Mediation Review: He said he has most medications but needs to pick up a few. He did not have any questions about the med regime Any new allergies since your discharge?: No Dietary orders reviewed?: Yes Type of Diet Ordered:: NPO.  Nutrition received via tube feedings. Godfrey Pick and his wife administer the feedings. Do you have support at home?: Yes People in Home: child(ren), adult Name of Support/Comfort Primary Source: he lives with his son and daughter in law and their family  Medications Reviewed Today: Medications Reviewed Today   Medications were not reviewed in this encounter     Home Care and Equipment/Supplies: Were Home Health Services Ordered?: No Any new equipment or medical supplies ordered?: No  Functional Questionnaire: Do you need assistance with bathing/showering or dressing?: Yes Do you need assistance with meal preparation?: No (NPO) Do you need assistance with eating?: No (NPO - receives tube  feedings) Do you have difficulty maintaining continence: Yes Do you need assistance with getting out of bed/getting out of a chair/moving?: Yes (has RW but Godfrey Pick said he is too weak to get out of bed at this time) Do you have difficulty managing or taking your medications?: Yes Godfrey Pick and his wife manage patient's medication regime)  Follow up appointments reviewed: PCP Follow-up appointment confirmed?: Yes Date of PCP follow-up appointment?: 05/05/23 Follow-up Provider: [name] Follow-up appointment confirmed?: NA Do you need transportation to your follow-up appointment?: No Do you understand care options if your condition(s) worsen?: Yes-patient verbalized understanding    SIGNATURE.Robyne Peers, RN

## 2023-05-04 ENCOUNTER — Other Ambulatory Visit: Payer: Self-pay

## 2023-05-05 ENCOUNTER — Other Ambulatory Visit: Payer: Self-pay

## 2023-05-05 ENCOUNTER — Ambulatory Visit: Payer: Self-pay | Attending: Internal Medicine | Admitting: Internal Medicine

## 2023-05-05 ENCOUNTER — Encounter: Payer: Self-pay | Admitting: Internal Medicine

## 2023-05-05 VITALS — BP 113/67 | HR 94 | Wt 123.0 lb

## 2023-05-05 DIAGNOSIS — Z2821 Immunization not carried out because of patient refusal: Secondary | ICD-10-CM

## 2023-05-05 DIAGNOSIS — E039 Hypothyroidism, unspecified: Secondary | ICD-10-CM

## 2023-05-05 DIAGNOSIS — Z931 Gastrostomy status: Secondary | ICD-10-CM

## 2023-05-05 DIAGNOSIS — I471 Supraventricular tachycardia, unspecified: Secondary | ICD-10-CM

## 2023-05-05 DIAGNOSIS — E43 Unspecified severe protein-calorie malnutrition: Secondary | ICD-10-CM

## 2023-05-05 DIAGNOSIS — A020 Salmonella enteritis: Secondary | ICD-10-CM

## 2023-05-05 DIAGNOSIS — Z28310 Unvaccinated for covid-19: Secondary | ICD-10-CM

## 2023-05-05 DIAGNOSIS — Z09 Encounter for follow-up examination after completed treatment for conditions other than malignant neoplasm: Secondary | ICD-10-CM

## 2023-05-05 DIAGNOSIS — D638 Anemia in other chronic diseases classified elsewhere: Secondary | ICD-10-CM

## 2023-05-05 MED ORDER — METOPROLOL TARTRATE 25 MG PO TABS
12.5000 mg | ORAL_TABLET | Freq: Two times a day (BID) | ORAL | 1 refills | Status: DC
  Filled 2023-05-05: qty 90, 90d supply, fill #0

## 2023-05-05 NOTE — Addendum Note (Signed)
Addended by: Jonah Blue B on: 05/05/2023 03:39 PM   Modules accepted: Orders

## 2023-05-05 NOTE — Progress Notes (Signed)
Patient ID: [name] [name], male    DOB: 04-07-1934  MRN: [id number]  CC: TOC visit Date of hospitalization 10/20-27/2024 Date of call from case worker 04/24/2023  Subjective: [name] [name] is a 89 y.o. male who presents for chronic ds management.  Daughter-in-law, [name] [name], is with and interprets.  [employer] form was signed on last visit 04/10/2023 His concerns today include:  history of HTN, hypothyroidism, BPH, normocytic anemia, gout, venous insufficiency, dysphagia with PEG placement 03/2022, DISH   Patient was hospitalized with vomiting, acute diarrhea, weakness and falls.  Found to be COVID-positive with acute renal insufficiency due to dehydration..  Also diagnosed with Salmonella enteritis.  Had run of SVT with elevated troponin.  Echo revealed normal EF of 60 to 65%.  Bisoprolol was changed to metoprolol 12.5 mg twice a day.  TSH found to be elevated but improved from 39 1 week prior to 18.  Levothyroxine dose had been recently increased by me to 75 mcg less than a month ago.  Patient has chronic anemia.  Prior to hospitalization H/H was 11.6/36.8.  At the time of discharge, H/H was 9.7/30.7  Today: Daughter-in-law reports that patient has had no further diarrhea or falls.  He has not had any chest pains or shortness of breath.  He uses his cane to get around in the house.  Does not have a wheelchair.  She assists him with his ADLs including baths and putting on close.  She continues with his regular feeds 6 times a day with 1 can of Jevity followed by water flush.  Daughter-in-law is requesting to have the tip of his PEG tube changed back to what he had prior to hospital.  The new one that was applied in the hospital has to be used with syringes that can screw in place for her to do the feeds.  She was given several of the syringes from the hospital.  However the supply of syringes that he gets from his supply company are the ones that do not screw on.  Daughter-in-law does not remember the name  of the company off the top of her head.  HM: They declined flu shot, second shingles vaccine, COVID 19 vaccine series. Patient Active Problem List   Diagnosis Date Noted   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   COVID-19 04/17/2023   Intractable nausea and vomiting 04/16/2023   COVID-19 virus infection 04/16/2023   SVT (supraventricular tachycardia) (HCC) 04/16/2023   Protein-calorie malnutrition, severe 04/08/2022   Dysphagia 04/02/2022   Aspiration pneumonia (HCC) 04/02/2022   Hyponatremia 04/02/2022   Elevated troponin 04/02/2022   AKI (acute kidney injury) (HCC) 04/02/2022   Edema 12/07/2021   Venous insufficiency of both lower extremities 12/07/2021   Eczema 12/07/2021   Hx of gout 01/24/2017   Hypothyroidism 10/30/2008   Anemia, chronic disease 09/18/2008   Essential hypertension 09/18/2008   BENIGN PROSTATIC HYPERTROPHY, WITH URINARY OBSTRUCTION 09/18/2008   Backache 09/18/2008     Current Outpatient Medications on File Prior to Visit  Medication Sig Dispense Refill   allopurinol (ZYLOPRIM) 100 MG tablet Take 1 tablet (100 mg total) by mouth daily. Must keep appointment in October 2024 for additional refills. 30 tablet 0   aspirin EC 81 MG tablet Take 1 tablet (81 mg total) by mouth daily. Swallow whole. 30 tablet 12   Iron, Ferrous Sulfate, 325 (65 Fe) MG TABS Take 1 tablet (325 mg) by mouth every other day. Please make an appointment with Dr. Laural Benes for more  refills. 60 tablet 2   levothyroxine (SYNTHROID) 75 MCG tablet Take 1 tablet (75 mcg total) by mouth daily before breakfast. 60 tablet 2   saccharomyces boulardii (FLORASTOR) 250 MG capsule Place 1 capsule (250 mg total) into feeding tube 2 (two) times daily. 60 capsule 0   Water For Irrigation, Sterile (FREE WATER) SOLN 50 mL free water flush via PEG tube before and after each bolus 9000 mL 11   No current facility-administered medications on file prior to visit.    No Known  Allergies  Social History   Socioeconomic History   Marital status: Married    Spouse name: Not on file   Number of children: Not on file   Years of education: Not on file   Highest education level: Not on file  Occupational History   Not on file  Tobacco Use   Smoking status: Never   Smokeless tobacco: Never  Vaping Use   Vaping status: Never Used  Substance and Sexual Activity   Alcohol use: No   Drug use: No   Sexual activity: Not Currently  Other Topics Concern   Not on file  Social History Narrative   Not on file   Social Determinants of Health   Financial Resource Strain: Not on file  Food Insecurity: Not on file  Transportation Needs: Not on file  Physical Activity: Not on file  Stress: Not on file  Social Connections: Not on file  Intimate Partner Violence: Not on file    Family History  Problem Relation Age of Onset   Stroke Neg Hx    Cancer Neg Hx     Past Surgical History:  Procedure Laterality Date   ESOPHAGOGASTRODUODENOSCOPY N/A 04/03/2022   Procedure: ESOPHAGOGASTRODUODENOSCOPY (EGD);  Surgeon: Jeani Hawking, MD;  [facility];  Service: Gastroenterology;  Laterality: N/A;   LAMINECTOMY  12/11/2008    Bilateral L2, L3, and L4 laminotomies, done by Dr. Lovell Sheehan   LAPAROSCOPY N/A 04/06/2022   Procedure: LAPAROSCOPIC ASSISTED PERCUTANEOUS ENDOSCOPIC GASTROSTOMY PLACEMENT;  Surgeon: Manus Rudd, MD;  Location: [facility] OR;  Service: General;  Laterality: N/A;    ROS: Review of Systems Negative except as stated above  PHYSICAL EXAM: BP 113/67 (BP Location: Right Arm, Patient Position: Sitting, Cuff Size: Normal)   Pulse 94   Wt 123 lb (55.8 kg)   SpO2 98%   BMI 16.68 kg/m   Wt Readings from Last 3 Encounters:  05/05/23 123 lb (55.8 kg)  04/22/23 121 lb 4.1 oz (55 kg)  04/10/23 119 lb 3.2 oz (54.1 kg)    Physical Exam   General appearance -pleasant elderly male who appears malnourished for height.  Clothing clean. Mental status  -patient is oriented and answers yes or no to some questions. Eyes -nonicteric sclera. Mouth -oral mucosa moist. Neck - supple, no significant adenopathy Chest - clear to auscultation, no wheezes, rales or rhonchi, symmetric air entry Heart - normal rate, regular rhythm, normal S1, S2, no murmurs, rubs, clicks or gallops Extremities -no lower extremity edema.  Wasting of muscles in the legs.  Patient is sitting in wheelchair.  He has his cane with him. Abdomen: PEG tube site appears clean.  I have taken a picture of the tip of the PEG tube to show to my CW next wk        03/10/2022   11:14 AM 12/07/2021   11:06 AM 01/24/2017    8:43 AM  Depression screen PHQ 2/9  Decreased Interest 0 0 0  Down, Depressed,  Hopeless 0 0 0  PHQ - 2 Score 0 0 0  Altered sleeping 0 0 0  Tired, decreased energy 3 0 0  Change in appetite 3 0 0  Feeling bad or failure about yourself  0 0 0  Trouble concentrating 0 0 0  Moving slowly or fidgety/restless 0 0 0  Suicidal thoughts 0 0 0  PHQ-9 Score 6 0 0       Latest Ref Rng & Units 04/23/2023    2:42 AM 04/22/2023    2:54 AM 04/21/2023    3:38 AM  CMP  Glucose 70 - 99 mg/dL 409  811  89   BUN 8 - 23 mg/dL 57  55  55   Creatinine 0.61 - 1.24 mg/dL 9.14  7.82  9.56   Sodium 135 - 145 mmol/L 137  142  145   Potassium 3.5 - 5.1 mmol/L 5.0  4.3  4.6   Chloride 98 - 111 mmol/L 106  111  114   CO2 22 - 32 mmol/L 25  24  24    Calcium 8.9 - 10.3 mg/dL 8.9  9.3  9.6    Lipid Panel     Component Value Date/Time   CHOL 129 04/17/2023 1831   CHOL 166 12/07/2021 1200   TRIG 64 04/17/2023 1831   HDL 44 04/17/2023 1831   HDL 48 12/07/2021 1200   CHOLHDL 2.9 04/17/2023 1831   VLDL 13 04/17/2023 1831   LDLCALC 72 04/17/2023 1831   LDLCALC 104 (H) 12/07/2021 1200    CBC    Component Value Date/Time   WBC 6.6 04/21/2023 0338   RBC 3.30 (L) 04/21/2023 0338   HGB 9.7 (L) 04/21/2023 0338   HGB 11.6 (L) 04/10/2023 1641   HCT 30.7 (L) 04/21/2023 0338    HCT 36.8 (L) 04/10/2023 1641   PLT 224 04/21/2023 0338   PLT 310 04/10/2023 1641   MCV 93.0 04/21/2023 0338   MCV 96 04/10/2023 1641   MCH 29.4 04/21/2023 0338   MCHC 31.6 04/21/2023 0338   RDW 14.6 04/21/2023 0338   RDW 13.0 04/10/2023 1641   LYMPHSABS 0.2 (L) 04/01/2022 2013   LYMPHSABS 1.9 03/10/2022 1143   MONOABS 0.2 04/01/2022 2013   EOSABS 0.0 04/01/2022 2013   EOSABS 0.2 03/10/2022 1143   BASOSABS 0.0 04/01/2022 2013   BASOSABS 0.1 03/10/2022 1143    ASSESSMENT AND PLAN: 1. Hospital discharge follow-up  2. Salmonella enteritis Resolved.  Patient has completed antibiotics. - Basic metabolic panel  3. SVT (supraventricular tachycardia) (HCC) Currently patient is in sinus rhythm on auscultation.  The episode of SVT occurred when he was in the emergency room and was short-lived.  Most likely was caused by acute illness.  Daughter-in-law was not aware that bisoprolol was discontinued and changed to metoprolol instead.  States that she did not receive this prescription on hospital discharge.  I told her that I will send it to our pharmacy. - metoprolol tartrate (LOPRESSOR) 25 MG tablet; Place 0.5 tablets (12.5 mg total) into feeding tube 2 (two) times daily. STOP BISOPROLOL  Dispense: 180 tablet; Refill: 1  4. Anemia, chronic disease Continue iron supplement - CBC  5. Hypothyroidism, unspecified type Continue levothyroxine 75 mcg.  I request that he returns to the lab the end of this month for repeat TSH check  6. Protein-calorie malnutrition, severe (HCC) 7. G tube feedings (HCC) Receiving feeds through PEG tube. In regards to the issue with the tip of the PEG tube, I told  the daughter-in-law to call the company that sends him the syringes and let them know that the tip of the PEG was changed to a different device and that he would need the syringes that screw on.  I told her I will also send a message to our caseworker to see if she can help facilitate this.  If the company  does not have the type of syringes that school on, we will have to send him to the ER or interventional radiology to have it changed back to his previous tip  8. Influenza vaccination declined Recommended.  They declined.  9. COVID-19 vaccine series declined Strongly recommend that he starts the COVID-19 vaccine series in about a month.  They declined this.  10. Herpes zoster vaccination declined   Patient was given the opportunity to ask questions.  Patient verbalized understanding of the plan and was able to repeat key elements of the plan.   This documentation was completed using Paediatric nurse.  Any transcriptional errors are unintentional.  Orders Placed This Encounter  Procedures   CBC   Basic metabolic panel     Requested Prescriptions   Signed Prescriptions Disp Refills   metoprolol tartrate (LOPRESSOR) 25 MG tablet 180 tablet 1    Sig: Place 0.5 tablets (12.5 mg total) into feeding tube 2 (two) times daily. STOP BISOPROLOL    Return in about 6 months (around 11/02/2023) for Give appt end of November for lab visit.  Jonah Blue, MD, FACP

## 2023-05-06 ENCOUNTER — Telehealth: Payer: Self-pay | Admitting: Internal Medicine

## 2023-05-06 DIAGNOSIS — E875 Hyperkalemia: Secondary | ICD-10-CM

## 2023-05-06 LAB — BASIC METABOLIC PANEL
BUN/Creatinine Ratio: 31 — ABNORMAL HIGH (ref 10–24)
BUN: 38 mg/dL — ABNORMAL HIGH (ref 8–27)
CO2: 21 mmol/L (ref 20–29)
Calcium: 9.8 mg/dL (ref 8.6–10.2)
Chloride: 100 mmol/L (ref 96–106)
Creatinine, Ser: 1.24 mg/dL (ref 0.76–1.27)
Glucose: 113 mg/dL — ABNORMAL HIGH (ref 70–99)
Potassium: 5.5 mmol/L — ABNORMAL HIGH (ref 3.5–5.2)
Sodium: 136 mmol/L (ref 134–144)
eGFR: 56 mL/min/{1.73_m2} — ABNORMAL LOW (ref >59)

## 2023-05-06 LAB — CBC
Hematocrit: 29 % — ABNORMAL LOW (ref 37.5–51.0)
Hemoglobin: 9.4 g/dL — ABNORMAL LOW (ref 13.0–17.7)
MCH: 29.7 pg (ref 26.6–33.0)
MCHC: 32.4 g/dL (ref 31.5–35.7)
MCV: 92 fL (ref 79–97)
Platelets: 407 10*3/uL (ref 150–450)
RBC: 3.16 x10E6/uL — ABNORMAL LOW (ref 4.14–5.80)
RDW: 12.4 % (ref 11.6–15.4)
WBC: 4.6 10*3/uL (ref 3.4–10.8)

## 2023-05-06 NOTE — Telephone Encounter (Signed)
Phone call placed to patient's son this afternoon to go over lab results.  I was able to speak to the patient's daughter-in-law, Janae Sauce, who is his caregiver.  Advised that the patient is still anemic with hemoglobin around 9.  Stable to what it was at the time of discharge.  I confirmed that he is getting iron supplement.  She states that she was giving it to him every other day.  I told her we should start giving it every day.  She expressed understanding.  Also informed her that the potassium level came back elevated again.  We will need to recheck this.  Requests if she can bring him to the lab sometime next week to have potassium level drawn for recheck.

## 2023-05-09 LAB — MISCELLANEOUS TEST

## 2023-05-16 ENCOUNTER — Telehealth: Payer: Self-pay

## 2023-05-16 NOTE — Telephone Encounter (Signed)
Message received from Dr Laural Benes stating patient's daughter in law explained that he had his PEG tube replaced and the adapter on the end of the tube requires syringes that screw on and they are used to the syringes that they just insert into the adapter. She said that the adapter cannot be removed.  The patient is uninsured.  I called [employer] and spoke to [facility] to inquire what syringes have been ordered for the patient. She said there is no order for syringes, only Jevity, a feeding pole, sponge and paper tape.   She said she sent a message to the enteral team requesting they call me.   I called patient's daughter in law, Souzan, about the syringes and she explained that it is just easier to use the syringes that they had been using, not the screw on syringes.  She said they gave her 3 of the screw on syringes and she has been washing them out and re-using them . She stated that her father has been receiving all of his feedings. She said she does not have packaging for either type of syringe.  I told her that I will need to try to get more information about the adapter and inquire if it can be removed so they can use the non-screw on syringes

## 2023-05-17 ENCOUNTER — Other Ambulatory Visit: Payer: Self-pay

## 2023-05-19 ENCOUNTER — Other Ambulatory Visit: Payer: Self-pay

## 2023-05-19 NOTE — Telephone Encounter (Signed)
I called patient's daughter in law, Souzan,and explained that I can do a virtual visit with her Monday with my co-worker and we can see if the adapter at the end of the PEG tube can be removed.  She said it has been removed and it takes a lot of pressure to put it back in.  I instructed her to continue to administer the tube feedings as she has been doing with the screw on syringe and we will further discuss next week.  She said she understood and was very Adult nurse.

## 2023-05-30 NOTE — Telephone Encounter (Signed)
Call placed to patient's daughter unable to reach message left on VM.

## 2023-05-30 NOTE — Telephone Encounter (Signed)
Late entry :1500 05/29/2023 Call placed to patient's daughter unable to reach message left on VM.

## 2023-06-01 NOTE — Telephone Encounter (Addendum)
Call placed to patient unable to reach message left on VM.   

## 2023-06-02 NOTE — Telephone Encounter (Signed)
Numerous calls to patient and pt's daughter to discuss their concerns about the patient ' s PEG tube . Letter mailed

## 2023-06-16 ENCOUNTER — Other Ambulatory Visit: Payer: Self-pay

## 2023-06-16 ENCOUNTER — Other Ambulatory Visit: Payer: Self-pay | Admitting: Internal Medicine

## 2023-06-16 DIAGNOSIS — Z8739 Personal history of other diseases of the musculoskeletal system and connective tissue: Secondary | ICD-10-CM

## 2023-06-16 MED ORDER — ALLOPURINOL 100 MG PO TABS
100.0000 mg | ORAL_TABLET | Freq: Every day | ORAL | 0 refills | Status: DC
  Filled 2023-06-16: qty 90, 90d supply, fill #0

## 2023-06-27 ENCOUNTER — Other Ambulatory Visit: Payer: Self-pay

## 2023-09-19 ENCOUNTER — Telehealth: Payer: Self-pay

## 2023-09-19 NOTE — Telephone Encounter (Signed)
 Message received from [facility], [facility] Financial Counselor stating the patient's son, Godfrey Pick,  was requesting a call back because his father is almost out of his tube feedings.    I called Godfrey Pick and he explained that his father is going to run out of his tube feedings tomorrow. He said he gives his father 2 bottles of Jevity 1.2 six times daily for a total of 12 bottles/day.  He stated he could not remember the name of the DME company providing the feedings but said he calls: [phone] when he needs to order the feedings and this time they asked for payment. He said he cannot afford that. He has 7 children and wants to keep his father home, he does not want to place him in a SNF. He was not sure of the cost /month but said he can't afford to purchase the feedings, he would need to take his father back to the hospital and leave him there. I asked him how he has been paying for the feedings until now and he said he did not know and stated he has not been paying for them .  I told him that I would call the number to order the feeding and see what additional information they could provide about determine who has been paying for the feedings.   I called the number noted from son and it is [employer].  I spoke to [employer] who said they have a note that the patient's son spoke to " someone" and he was told that [facility] will no longer be paying for the feedings. The last order that was placed was 07/31/2023 and Toni Amend said that [facility] has been paying since 2023.  I then spoke to Camille/ [employer] Enteral Team and she said that the patient's formula + syringes is  $799.09/month. She was not sure if they would accept a payment plan for the patient.  I then spoke to Mickeal Needy: [phone]  with [employer] to inquire if [employer] could provide the patient with at least 1 month of feedings while his son, works out how he will pay for the feedings going forward.  He asked that I email  him the information about the patient and he will contact the enteral team for further guidance.  Email sent to [name] as he requested.   I called the patient's son, Godfrey Pick, back and explained my conversations with [employer] and that we are trying to get his father the feedings for a month while he works out how he will pay for the feedings going forward.  He said his father has the orange Card and I explained that is not insurance and will not pay for enteral feedings.  Godfrey Pick said he does not have [employer] and I explained that even if he did, it will not pay for enteral feedings either  He asked about Medicaid and said he has never applied for his father.  He stated his father has a Magazine features editor.  I told him that I can refer him to the [name] DSS Medicaid Eligibility Caseworkers to see if his father would qualify. I explained that I can ask the caseworkers to contact him- there is no guarantee that he will qualify but we can try.  Godfrey Pick was in agreement and a message was sent to the caseworkers.  I also provided [facility] with the phone number for [employer] Patient Assistance Program : [phone] and instructed him to call for  additional information to see if they can provide any assistance

## 2023-09-19 NOTE — Telephone Encounter (Signed)
 I called and left a message for [name] [name]/ [employer] inquiring if he has an update on the status of the request for assistance with tube feedings.

## 2023-09-19 NOTE — Telephone Encounter (Addendum)
 Message sent to Wandra Mannan, Director of [facility] inquiring if [facility] can provide another month of feedings for the patient.   I called patient's son, Godfrey Pick, to provide an update:  I told him that I received a message from the [facility] caseworkers and they will be calling him tomorrow.  I also explained to him that I have reached out to [employer] and the hospital [facility] Director requesting assistance with paying for the Jevity for another month while he Godfrey Pick) works on a plan for obtaining the feedings after that.  I emphasized that if any assistance is obtained it will be very limited, possibly only for a month at most   He said he is really not sure how he would ever be able to pay for the feedings. He explained again that he has 7 children and then said that his wife is pregnant.  I asked if he contacted [employer] and he said he did.  He stated that [employer] emailed him the application and he completed his part and he needs Dr Laural Benes to complete the prescriber's portion.  He dropped off the document at [facility] this afternoon  He said that they told him he has been approved, they just need the doctor's portion completed noting that the patient receives 100% of his nutritional requirements with the Jevity enteral feedings.  I told him that I would make sure that Dr Laural Benes has the documents to review.   Godfrey Pick said that he has enough formula for feedings tomorrow, 09/20/2023.  He then went on to say that his father's condition has deteriorated and he thinks he needs to take him to the hospital.  He said his father is weak, not able to walk any longer, requires total assistance with his ADLs, unable to care for himself at all like he used to.   Godfrey Pick also said that he has to lift his father out of the bed, and that is becoming difficult to do. I explained to [place] that if his father's status has changed that much, he needs to be evaluated and should be seen in the ED.  Godfrey Pick said he understood and  agrees that he needs to be seen as soon as possible I also explained that depending on his status, the orders for his feedings may need to change. Again he said he understood and said he will take him to be assessed.

## 2023-09-20 ENCOUNTER — Emergency Department (HOSPITAL_COMMUNITY): Payer: MEDICAID

## 2023-09-20 ENCOUNTER — Other Ambulatory Visit: Payer: Self-pay

## 2023-09-20 ENCOUNTER — Encounter (HOSPITAL_COMMUNITY): Payer: Self-pay

## 2023-09-20 ENCOUNTER — Inpatient Hospital Stay (HOSPITAL_COMMUNITY)
Admission: EM | Admit: 2023-09-20 | Discharge: 2023-09-30 | DRG: 177 | Disposition: A | Discharge status: Home / Self Care | Payer: MEDICAID | Attending: Family Medicine | Admitting: Family Medicine

## 2023-09-20 DIAGNOSIS — D638 Anemia in other chronic diseases classified elsewhere: Secondary | ICD-10-CM | POA: Diagnosis present

## 2023-09-20 DIAGNOSIS — Z681 Body mass index (BMI) 19 or less, adult: Secondary | ICD-10-CM

## 2023-09-20 DIAGNOSIS — N138 Other obstructive and reflux uropathy: Secondary | ICD-10-CM | POA: Diagnosis present

## 2023-09-20 DIAGNOSIS — R1312 Dysphagia, oropharyngeal phase: Secondary | ICD-10-CM

## 2023-09-20 DIAGNOSIS — N1831 Chronic kidney disease, stage 3a: Secondary | ICD-10-CM | POA: Diagnosis present

## 2023-09-20 DIAGNOSIS — I872 Venous insufficiency (chronic) (peripheral): Secondary | ICD-10-CM | POA: Diagnosis present

## 2023-09-20 DIAGNOSIS — R7989 Other specified abnormal findings of blood chemistry: Secondary | ICD-10-CM | POA: Diagnosis present

## 2023-09-20 DIAGNOSIS — E875 Hyperkalemia: Secondary | ICD-10-CM | POA: Diagnosis present

## 2023-09-20 DIAGNOSIS — K5900 Constipation, unspecified: Secondary | ICD-10-CM | POA: Diagnosis present

## 2023-09-20 DIAGNOSIS — E43 Unspecified severe protein-calorie malnutrition: Secondary | ICD-10-CM | POA: Diagnosis present

## 2023-09-20 DIAGNOSIS — R55 Syncope and collapse: Secondary | ICD-10-CM | POA: Diagnosis present

## 2023-09-20 DIAGNOSIS — I129 Hypertensive chronic kidney disease with stage 1 through stage 4 chronic kidney disease, or unspecified chronic kidney disease: Secondary | ICD-10-CM | POA: Diagnosis present

## 2023-09-20 DIAGNOSIS — L8990 Pressure ulcer of unspecified site, unspecified stage: Secondary | ICD-10-CM | POA: Diagnosis present

## 2023-09-20 DIAGNOSIS — R6251 Failure to thrive (child): Secondary | ICD-10-CM | POA: Diagnosis present

## 2023-09-20 DIAGNOSIS — Z931 Gastrostomy status: Secondary | ICD-10-CM

## 2023-09-20 DIAGNOSIS — D509 Iron deficiency anemia, unspecified: Secondary | ICD-10-CM | POA: Diagnosis present

## 2023-09-20 DIAGNOSIS — M109 Gout, unspecified: Secondary | ICD-10-CM | POA: Diagnosis present

## 2023-09-20 DIAGNOSIS — Z431 Encounter for attention to gastrostomy: Secondary | ICD-10-CM

## 2023-09-20 DIAGNOSIS — N401 Enlarged prostate with lower urinary tract symptoms: Secondary | ICD-10-CM | POA: Diagnosis present

## 2023-09-20 DIAGNOSIS — L89153 Pressure ulcer of sacral region, stage 3: Secondary | ICD-10-CM | POA: Diagnosis present

## 2023-09-20 DIAGNOSIS — D631 Anemia in chronic kidney disease: Secondary | ICD-10-CM | POA: Diagnosis present

## 2023-09-20 DIAGNOSIS — I700 Atherosclerosis of aorta: Secondary | ICD-10-CM | POA: Diagnosis present

## 2023-09-20 DIAGNOSIS — J690 Pneumonitis due to inhalation of food and vomit: Principal | ICD-10-CM | POA: Diagnosis present

## 2023-09-20 DIAGNOSIS — Z79890 Hormone replacement therapy (postmenopausal): Secondary | ICD-10-CM

## 2023-09-20 DIAGNOSIS — R627 Adult failure to thrive: Secondary | ICD-10-CM | POA: Diagnosis present

## 2023-09-20 DIAGNOSIS — R1310 Dysphagia, unspecified: Secondary | ICD-10-CM

## 2023-09-20 DIAGNOSIS — I10 Essential (primary) hypertension: Secondary | ICD-10-CM | POA: Diagnosis present

## 2023-09-20 DIAGNOSIS — Z79899 Other long term (current) drug therapy: Secondary | ICD-10-CM

## 2023-09-20 DIAGNOSIS — W06XXXA Fall from bed, initial encounter: Secondary | ICD-10-CM | POA: Diagnosis present

## 2023-09-20 DIAGNOSIS — E039 Hypothyroidism, unspecified: Secondary | ICD-10-CM | POA: Diagnosis present

## 2023-09-20 DIAGNOSIS — Z1152 Encounter for screening for COVID-19: Secondary | ICD-10-CM

## 2023-09-20 DIAGNOSIS — J189 Pneumonia, unspecified organism: Secondary | ICD-10-CM | POA: Diagnosis present

## 2023-09-20 DIAGNOSIS — E860 Dehydration: Principal | ICD-10-CM | POA: Diagnosis present

## 2023-09-20 DIAGNOSIS — F0390 Unspecified dementia without behavioral disturbance: Secondary | ICD-10-CM | POA: Diagnosis present

## 2023-09-20 DIAGNOSIS — Z7401 Bed confinement status: Secondary | ICD-10-CM

## 2023-09-20 DIAGNOSIS — W19XXXA Unspecified fall, initial encounter: Secondary | ICD-10-CM

## 2023-09-20 DIAGNOSIS — E870 Hyperosmolality and hypernatremia: Secondary | ICD-10-CM | POA: Diagnosis present

## 2023-09-20 DIAGNOSIS — E538 Deficiency of other specified B group vitamins: Secondary | ICD-10-CM | POA: Diagnosis present

## 2023-09-20 DIAGNOSIS — R64 Cachexia: Secondary | ICD-10-CM | POA: Diagnosis present

## 2023-09-20 DIAGNOSIS — Z91014 Allergy to mammalian meats: Secondary | ICD-10-CM

## 2023-09-20 DIAGNOSIS — I5A Non-ischemic myocardial injury (non-traumatic): Secondary | ICD-10-CM | POA: Diagnosis present

## 2023-09-20 DIAGNOSIS — Y92009 Unspecified place in unspecified non-institutional (private) residence as the place of occurrence of the external cause: Secondary | ICD-10-CM

## 2023-09-20 LAB — COMPREHENSIVE METABOLIC PANEL
ALT: 21 U/L (ref 0–44)
AST: 30 U/L (ref 15–41)
Albumin: 2.3 g/dL — ABNORMAL LOW (ref 3.5–5.0)
Alkaline Phosphatase: 62 U/L (ref 38–126)
Anion gap: 6 (ref 5–15)
BUN: 66 mg/dL — ABNORMAL HIGH (ref 8–23)
CO2: 24 mmol/L (ref 22–32)
Calcium: 9.5 mg/dL (ref 8.9–10.3)
Chloride: 113 mmol/L — ABNORMAL HIGH (ref 98–111)
Creatinine, Ser: 1.26 mg/dL — ABNORMAL HIGH (ref 0.61–1.24)
GFR, Estimated: 55 mL/min — ABNORMAL LOW (ref >60)
Glucose, Bld: 136 mg/dL — ABNORMAL HIGH (ref 70–99)
Potassium: 5.3 mmol/L — ABNORMAL HIGH (ref 3.5–5.1)
Sodium: 143 mmol/L (ref 135–145)
Total Bilirubin: 0.4 mg/dL (ref 0.0–1.2)
Total Protein: 7.7 g/dL (ref 6.5–8.1)

## 2023-09-20 LAB — CBC WITH DIFFERENTIAL/PLATELET
Abs Immature Granulocytes: 0.02 10*3/uL (ref 0.00–0.07)
Basophils Absolute: 0 10*3/uL (ref 0.0–0.1)
Basophils Relative: 1 %
Eosinophils Absolute: 0.2 10*3/uL (ref 0.0–0.5)
Eosinophils Relative: 2 %
HCT: 31.8 % — ABNORMAL LOW (ref 39.0–52.0)
Hemoglobin: 9.3 g/dL — ABNORMAL LOW (ref 13.0–17.0)
Immature Granulocytes: 0 %
Lymphocytes Relative: 13 %
Lymphs Abs: 0.8 10*3/uL (ref 0.7–4.0)
MCH: 26.1 pg (ref 26.0–34.0)
MCHC: 29.2 g/dL — ABNORMAL LOW (ref 30.0–36.0)
MCV: 89.1 fL (ref 80.0–100.0)
Monocytes Absolute: 0.5 10*3/uL (ref 0.1–1.0)
Monocytes Relative: 7 %
Neutro Abs: 5 10*3/uL (ref 1.7–7.7)
Neutrophils Relative %: 77 %
Platelets: 318 10*3/uL (ref 150–400)
RBC: 3.57 MIL/uL — ABNORMAL LOW (ref 4.22–5.81)
RDW: 16.6 % — ABNORMAL HIGH (ref 11.5–15.5)
WBC: 6.5 10*3/uL (ref 4.0–10.5)
nRBC: 0 % (ref 0.0–0.2)

## 2023-09-20 LAB — I-STAT CG4 LACTIC ACID, ED
Lactic Acid, Venous: 2.1 mmol/L (ref 0.5–1.9)
Lactic Acid, Venous: 1.8 mmol/L (ref 0.5–1.9)

## 2023-09-20 LAB — I-STAT VENOUS BLOOD GAS, ED
Acid-Base Excess: 1 mmol/L (ref 0.0–2.0)
Bicarbonate: 25.9 mmol/L (ref 20.0–28.0)
Calcium, Ion: 1.17 mmol/L (ref 1.15–1.40)
HCT: 25 % — ABNORMAL LOW (ref 39.0–52.0)
Hemoglobin: 8.5 g/dL — ABNORMAL LOW (ref 13.0–17.0)
O2 Saturation: 70 %
Potassium: 6.2 mmol/L — ABNORMAL HIGH (ref 3.5–5.1)
Sodium: 145 mmol/L (ref 135–145)
TCO2: 27 mmol/L (ref 22–32)
pCO2, Ven: 40 mmHg — ABNORMAL LOW (ref 44–60)
pH, Ven: 7.419 (ref 7.25–7.43)
pO2, Ven: 36 mmHg (ref 32–45)

## 2023-09-20 LAB — TROPONIN I (HIGH SENSITIVITY)
Troponin I (High Sensitivity): 122 ng/L (ref <18)
Troponin I (High Sensitivity): 115 ng/L (ref <18)

## 2023-09-20 LAB — LIPASE, BLOOD: Lipase: 52 U/L — ABNORMAL HIGH (ref 11–51)

## 2023-09-20 LAB — TSH: TSH: 68.884 u[IU]/mL — ABNORMAL HIGH (ref 0.350–4.500)

## 2023-09-20 MED ORDER — LACTATED RINGERS IV BOLUS
500.0000 mL | Freq: Once | INTRAVENOUS | Status: AC
  Administered 2023-09-20: 500 mL via INTRAVENOUS

## 2023-09-20 MED ORDER — SODIUM CHLORIDE 0.9 % IV SOLN
500.0000 mg | INTRAVENOUS | Status: AC
  Administered 2023-09-21 – 2023-09-25 (×5): 500 mg via INTRAVENOUS
  Filled 2023-09-20 (×4): qty 5

## 2023-09-20 MED ORDER — AZITHROMYCIN 500 MG IV SOLR
500.0000 mg | Freq: Once | INTRAVENOUS | Status: DC
  Filled 2023-09-20: qty 5

## 2023-09-20 MED ORDER — IOHEXOL 350 MG/ML SOLN
75.0000 mL | Freq: Once | INTRAVENOUS | Status: AC | PRN
Start: 2023-09-20 — End: 2023-09-20
  Administered 2023-09-20: 75 mL via INTRAVENOUS

## 2023-09-20 MED ORDER — SODIUM CHLORIDE 0.9 % IV SOLN
1.0000 g | Freq: Once | INTRAVENOUS | Status: AC
  Administered 2023-09-20: 1 g via INTRAVENOUS
  Filled 2023-09-20: qty 10

## 2023-09-20 MED ORDER — LACTATED RINGERS IV SOLN: INTRAVENOUS | Status: AC

## 2023-09-20 MED ORDER — THIAMINE HCL 100 MG/ML IJ SOLN
100.0000 mg | Freq: Every day | INTRAMUSCULAR | Status: DC
  Administered 2023-09-21 – 2023-09-28 (×7): 100 mg via INTRAVENOUS
  Filled 2023-09-20 (×8): qty 2

## 2023-09-20 NOTE — H&P (Incomplete)
 [name] [name]:[id number] DOB: 1934-06-02 DOA: 09/20/2023     PCP: Marcine Matar, MD      Patient arrived to ER on 09/20/23 at 1649 Referred by Attending Vanetta Mulders, MD   Patient coming from:    home Lives With family      Chief Complaint:   Chief Complaint  Patient presents with  . Loss of Consciousness    HPI: [name] is a 89 y.o. male with medical history significant of Dementia, eczema,  HTN, hypothyroidism, BPH, normocytic anemia, gout, venous insufficiency, dysphagia with PEG placement 03/2022,    Presented with  fall Patient presents from home with an episode of syncope and hitting head Patient has been deteriorating progressively at home. Has significant dementia at baseline completely tube feed dependent.  Family unable to pay for tube feedings going forward. He finally ran out of all his tube feedings Patient is completely dependent on them Patient has been moaning and has been ongoing for at least a month family states that they no longer able to take care of him at home patient found to be significantly dehydrated.   Family reports he has been having a lot of mucus and cough. States he is no longer able to ambulate to use the ambulate with a cane and has been falling out of bed frequently.  Patient declined over the past 3 weeks used to be able to ambulate to go to the bathroom now has been incontinent    Denies significant ETOH intake   Does not smoke   Lab Results  Component Value Date   SARSCOV2NAA POSITIVE (A) 04/16/2023   SARSCOV2NAA NEGATIVE 04/01/2022   SARSCOV2NAA NEGATIVE 03/06/2022       Regarding pertinent Chronic problems:       chronic CHF diastolic   - last echo  Recent Results (from the past 81191 hours)  ECHOCARDIOGRAM COMPLETE   Collection Time: 04/17/23  2:37 PM  Result Value   Weight 1,844.81   Height 72   BP 96/51   S' Lateral 2.60   Area-P 1/2 3.13   Est EF 60 - 65%   Narrative      ECHOCARDIOGRAM REPORT           1. Left ventricular ejection fraction, by estimation, is 60 to 65%. The left ventricle has normal function. The left ventricle has no regional wall motion abnormalities. There is moderate concentric left ventricular hypertrophy. Left ventricular  diastolic parameters are consistent with Grade I diastolic dysfunction (impaired relaxation).  2. Right ventricular systolic function is normal. The right ventricular size is normal. Tricuspid regurgitation signal is inadequate for assessing PA pressure.  3. The mitral valve is normal in structure. Trivial mitral valve regurgitation.  4. The aortic valve is tricuspid. There is mild calcification of the aortic valve. Aortic valve regurgitation is not visualized. Aortic valve sclerosis/calcification is present, without any evidence of aortic stenosis.  5. The inferior vena cava is normal in size with greater than 50% respiratory variability, suggesting right atrial pressure of 3 mmHg.           Hypothyroidism:   Lab Results  Component Value Date   TSH 68.884 (H) 09/20/2023   on synthroid family states he is compliant  Malnutrition-   BMI Readings from Last 1 Encounters:  05/05/23 16.68 kg/m       CKD stage IIIa baseline Cr 1.2 CrCl cannot be calculated (Unknown ideal weight.).  Lab Results  Component Value Date   CREATININE  1.26 (H) 09/20/2023   CREATININE 1.24 05/05/2023   CREATININE 1.12 04/23/2023   Lab Results  Component Value Date   NA 145 09/20/2023   CL 113 (H) 09/20/2023   K 6.2 (H) 09/20/2023   CO2 24 09/20/2023   BUN 66 (H) 09/20/2023   CREATININE 1.26 (H) 09/20/2023   GFRNONAA 55 (L) 09/20/2023   CALCIUM 9.5 09/20/2023   PHOS 2.4 (L) 04/08/2022   ALBUMIN 2.3 (L) 09/20/2023   GLUCOSE 136 (H) 09/20/2023   .   BPH -not on meds      Dementia -chronic    Chronic anemia - baseline hg Hemoglobin & Hematocrit  Recent Labs    05/05/23 1102 09/20/23 1733 09/20/23 1931  HGB 9.4* 9.3* 8.5*    Iron/TIBC/Ferritin/ %Sat    Component Value Date/Time   IRON 33 (L) 12/07/2021 1200   TIBC 184 (L) 12/07/2021 1200   FERRITIN 294 12/07/2021 1200   IRONPCTSAT 18 12/07/2021 1200       While in ER:    CXR showed multifocal PNA     Lab Orders         Culture, blood (Routine X 2) w Reflex to ID Panel         Resp panel by RT-PCR (RSV, Flu A&B, Covid) Anterior Nasal Swab         Comprehensive metabolic panel         Lipase, blood         CBC with Differential/Platelet         Urinalysis, Routine w reflex microscopic -Urine, Clean Catch         TSH         I-Stat Lactic Acid         I-Stat venous blood gas, ([facility], [facility], [facility])      CT HEAD   NON acute chronic changes CT maxillofacial only and CT neck nonacute    CXR -mild left lung and right basilar scarring or infiltrate  CTabd/pelvis -  Multifocal pneumonia with debris noted within the left lower bronchials. CT chest    Following Medications were ordered in ER: Medications  lactated ringers infusion ( Intravenous New Bag/Given 09/20/23 2152)  cefTRIAXone (ROCEPHIN) 1 g in sodium chloride 0.9 % 100 mL IVPB (has no administration in time range)  azithromycin (ZITHROMAX) 500 mg in sodium chloride 0.9 % 250 mL IVPB (has no administration in time range)  lactated ringers bolus 500 mL (0 mLs Intravenous Stopped 09/20/23 1800)  lactated ringers bolus 500 mL (0 mLs Intravenous Stopped 09/20/23 2152)  iohexol (OMNIPAQUE) 350 MG/ML injection 75 mL (75 mLs Intravenous Contrast Given 09/20/23 2106)    _______________________________________________________ ER Provider Called:       DrMarland Kitchen  They Recommend admit to medicine *** Will see in AM  ***SEEN in ER     ED Triage Vitals [09/20/23 1700]  Encounter Vitals Group     BP 136/73     Systolic BP Percentile      Diastolic BP Percentile      Pulse Rate 100     Resp (!) 26     Temp 98 F (36.7 C)     Temp Source Rectal     SpO2 95 %     Weight      Height      Head  Circumference      Peak Flow      Pain Score      Pain Loc  Pain Education      Exclude from Growth Chart   [id number]     _________________________________________ Significant initial  Findings: Abnormal Labs Reviewed  COMPREHENSIVE METABOLIC PANEL - Abnormal; Notable for the following components:      Result Value   Potassium 5.3 (*)    Chloride 113 (*)    Glucose, Bld 136 (*)    BUN 66 (*)    Creatinine, Ser 1.26 (*)    Albumin 2.3 (*)    GFR, Estimated 55 (*)    All other components within normal limits  LIPASE, BLOOD - Abnormal; Notable for the following components:   Lipase 52 (*)    All other components within normal limits  CBC WITH DIFFERENTIAL/PLATELET - Abnormal; Notable for the following components:   RBC 3.57 (*)    Hemoglobin 9.3 (*)    HCT 31.8 (*)    MCHC 29.2 (*)    RDW 16.6 (*)    All other components within normal limits  TSH - Abnormal; Notable for the following components:   TSH 68.884 (*)    All other components within normal limits  I-STAT CG4 LACTIC ACID, ED - Abnormal; Notable for the following components:   Lactic Acid, Venous 2.1 (*)    All other components within normal limits  I-STAT VENOUS BLOOD GAS, ED - Abnormal; Notable for the following components:   pCO2, Ven 40.0 (*)    Potassium 6.2 (*)    HCT 25.0 (*)    Hemoglobin 8.5 (*)    All other components within normal limits  TROPONIN I (HIGH SENSITIVITY) - Abnormal; Notable for the following components:   Troponin I (High Sensitivity) 122 (*)    All other components within normal limits  TROPONIN I (HIGH SENSITIVITY) - Abnormal; Notable for the following components:   Troponin I (High Sensitivity) 115 (*)    All other components within normal limits      _________________________ Troponin ***ordered Cardiac Panel (last 3 results) Recent Labs    09/20/23 1732 09/20/23 1925  TROPONINIHS 122* 115*     ECG: Ordered Personally reviewed and interpreted by me showing: HR :  *** Rhythm: *NSR, Sinus tachycardia * A.fib. W RVR, RBBB, LBBB, Paced Ischemic changes*nonspecific changes, no evidence of ischemic changes QTC*  BNP (last 3 results) No results for input(s): "BNP" in the last 8760 hours.   COVID-19 Labs  No results for input(s): "DDIMER", "FERRITIN", "LDH", "CRP" in the last 72 hours.  Lab Results  Component Value Date   SARSCOV2NAA POSITIVE (A) 04/16/2023   SARSCOV2NAA NEGATIVE 04/01/2022   SARSCOV2NAA NEGATIVE 03/06/2022       ____________________ This patient meets SIRS Criteria and may be septic. SIRS = Systemic Inflammatory Response Syndrome  Order a lactic acid level if needed AND/OR Initiate the sepsis protocol with the attached order set OR Click "Treating Associated Infection or Illness" if the patient is being treated for an infection that is a known cause of these abnormalities     The recent clinical data is shown below. Vitals:   09/20/23 1700 09/20/23 1840 09/20/23 2120  BP: 136/73 (!) 155/98 (!) 159/89  Pulse: 100 90 86  Resp: (!) 26 18 (!) 22  Temp: 98 F (36.7 C)  98.1 F (36.7 C)  TempSrc: Rectal  Tympanic  SpO2: 95% 91% 100%        WBC     Component Value Date/Time   WBC 6.5 09/20/2023 1733   LYMPHSABS 0.8 09/20/2023 1733   LYMPHSABS 1.9  03/10/2022 1143   MONOABS 0.5 09/20/2023 1733   EOSABS 0.2 09/20/2023 1733   EOSABS 0.2 03/10/2022 1143   BASOSABS 0.0 09/20/2023 1733   BASOSABS 0.1 03/10/2022 1143        Lactic Acid, Venous    Component Value Date/Time   LATICACIDVEN 1.8 09/20/2023 1932      Lactic Acid, Venous    Component Value Date/Time   LATICACIDVEN 1.8 09/20/2023 1932    Procalcitonin *** Ordered      UA *** no evidence of UTI  ***Pending ***not ordered   Urine analysis:    Component Value Date/Time   COLORURINE YELLOW 04/16/2023 1424   APPEARANCEUR CLEAR 04/16/2023 1424   LABSPEC 1.028 04/16/2023 1424   PHURINE 6.0 04/16/2023 1424   GLUCOSEU NEGATIVE 04/16/2023 1424    HGBUR TRACE (A) 04/16/2023 1424   BILIRUBINUR NEGATIVE 04/16/2023 1424   KETONESUR NEGATIVE 04/16/2023 1424   PROTEINUR TRACE (A) 04/16/2023 1424   UROBILINOGEN 0.2 12/03/2021 1921   NITRITE NEGATIVE 04/16/2023 1424   LEUKOCYTESUR NEGATIVE 04/16/2023 1424    Results for orders placed or performed during the hospital encounter of 04/16/23  Blood culture (routine x 2)     Status: None   Collection Time: 04/16/23  9:36 AM   Specimen: BLOOD  Result Value Ref Range Status   Specimen Description   Final    BLOOD RIGHT ANTECUBITAL Performed at [facility], [address], [place], [place] [address]    Special Requests   Final    BOTTLES DRAWN AEROBIC AND ANAEROBIC Blood Culture adequate volume Performed at [facility], [address] [place], [place] [address]    Culture   Final    NO GROWTH 5 DAYS Performed at [name], [address], [place], [place] [address]    Report Status 04/21/2023 FINAL  Final  Blood culture (routine x 2)     Status: None   Collection Time: 04/16/23 10:00 AM   Specimen: BLOOD RIGHT ARM  Result Value Ref Range Status   Specimen Description   Final    BLOOD RIGHT ARM Performed at [facility], [address] [place], [place] [address]    Special Requests   Final    BOTTLES DRAWN AEROBIC ONLY Blood Culture adequate volume Performed at [facility], [address] [place], [place] [address]    Culture   Final    NO GROWTH 5 DAYS Performed at [facility], [address], [place], [place] [address]    Report Status 04/21/2023 FINAL  Final  Resp panel by RT-PCR (RSV, Flu A&B, Covid) Anterior Nasal Swab     Status: Abnormal   Collection Time: 04/16/23  2:59 PM   Specimen: Anterior Nasal Swab  Result Value Ref Range Status   SARS Coronavirus 2 by RT PCR POSITIVE (A) NEGATIVE Final    Comment: (NOTE) SARS-CoV-2 target nucleic acids are DETECTED.  The SARS-CoV-2 RNA  is generally detectable in upper respiratory specimens during the acute phase of infection. Positive results are indicative of the presence of the identified virus, but do not rule out bacterial infection or co-infection with other pathogens not detected by the test. Clinical correlation with patient history and other diagnostic information is necessary to determine patient infection status. The expected result is Negative.  Fact Sheet for Patients: BloggerCourse.com  Fact Sheet for Healthcare Providers: SeriousBroker.it  This test is not yet approved or cleared by the [place] FDA and  has  been authorized for detection and/or diagnosis of SARS-CoV-2 by FDA under an Emergency Use Authorization (EUA).  This EUA will remain in effect (meaning this test can be used) for the duration of  the COVID-19 declaration under Section 564(b)(1) of the A ct, 21 U.S.C. section 360bbb-3(b)(1), unless the authorization is terminated or revoked sooner.     Influenza A by PCR NEGATIVE NEGATIVE Final   Influenza B by PCR NEGATIVE NEGATIVE Final    Comment: (NOTE) The Xpert Xpress SARS-CoV-2/FLU/RSV plus assay is intended as an aid in the diagnosis of influenza from Nasopharyngeal swab specimens and should not be used as a sole basis for treatment. Nasal washings and aspirates are unacceptable for Xpert Xpress SARS-CoV-2/FLU/RSV testing.  Fact Sheet for Patients: BloggerCourse.com  Fact Sheet for Healthcare Providers: SeriousBroker.it  This test is not yet approved or cleared by the [place] FDA and has been authorized for detection and/or diagnosis of SARS-CoV-2 by FDA under an Emergency Use Authorization (EUA). This EUA will remain in effect (meaning this test can be used) for the duration of the COVID-19 declaration under Section 564(b)(1) of the Act, 21 U.S.C. section 360bbb-3(b)(1),  unless the authorization is terminated or revoked.     Resp Syncytial Virus by PCR NEGATIVE NEGATIVE Final    Comment: (NOTE) Fact Sheet for Patients: BloggerCourse.com  Fact Sheet for Healthcare Providers: SeriousBroker.it  This test is not yet approved or cleared by the [place] FDA and has been authorized for detection and/or diagnosis of SARS-CoV-2 by FDA under an Emergency Use Authorization (EUA). This EUA will remain in effect (meaning this test can be used) for the duration of the COVID-19 declaration under Section 564(b)(1) of the Act, 21 U.S.C. section 360bbb-3(b)(1), unless the authorization is terminated or revoked.  Performed at [facility], [address] [place], [place] [address]   Gastrointestinal Panel by PCR , Stool     Status: Abnormal   Collection Time: 04/16/23  8:26 PM   Specimen: Stool  Result Value Ref Range Status   Campylobacter species NOT DETECTED NOT DETECTED Final   Plesimonas shigelloides NOT DETECTED NOT DETECTED Final   Salmonella species DETECTED (A) NOT DETECTED Final    Comment: RESULT CALLED TO, READ BACK BY AND VERIFIED WITH: MAGGIE CHRISMON 1407 04/17/23 MU    Yersinia enterocolitica NOT DETECTED NOT DETECTED Final   Vibrio species NOT DETECTED NOT DETECTED Final   Vibrio cholerae NOT DETECTED NOT DETECTED Final   Enteroaggregative E coli (EAEC) NOT DETECTED NOT DETECTED Final   Enteropathogenic E coli (EPEC) NOT DETECTED NOT DETECTED Final   Enterotoxigenic E coli (ETEC) NOT DETECTED NOT DETECTED Final   Shiga like toxin producing E coli (STEC) NOT DETECTED NOT DETECTED Final   Shigella/Enteroinvasive E coli (EIEC) NOT DETECTED NOT DETECTED Final   Cryptosporidium NOT DETECTED NOT DETECTED Final   Cyclospora cayetanensis NOT DETECTED NOT DETECTED Final   Entamoeba histolytica NOT DETECTED NOT DETECTED Final   Giardia lamblia NOT DETECTED NOT DETECTED Final    Adenovirus F40/41 NOT DETECTED NOT DETECTED Final   Astrovirus NOT DETECTED NOT DETECTED Final   Norovirus GI/GII NOT DETECTED NOT DETECTED Final   Rotavirus A NOT DETECTED NOT DETECTED Final   Sapovirus (I, II, IV, and V) NOT DETECTED NOT DETECTED Final    Comment: Performed at [facility], [address], [place], [place] [address]  C Difficile Quick Screen w PCR reflex     Status: None   Collection Time: 04/16/23  8:26 PM  Specimen: Stool  Result Value Ref Range Status   C Diff antigen NEGATIVE NEGATIVE Final   C Diff toxin NEGATIVE NEGATIVE Final   C Diff interpretation No C. difficile detected.  Final    Comment: Performed at [facility], [address] [place], [place] [address]    ABX started Rocephin/ azithromyacin  ____________________________________________________ Venous  Blood Gas result:  pH  7.419 Sodium 145 mmol/L   pCO2, Ven 40.0 Low  mmHg Potassium 6.2 High  mmol/L  pO2, Ven 36 mmHg        __________________________________________________________ Recent Labs  Lab 09/20/23 1733 09/20/23 1931  NA 143 145  K 5.3* 6.2*  CO2 24  --   GLUCOSE 136*  --   BUN 66*  --   CREATININE 1.26*  --   CALCIUM 9.5  --     Cr   stable,   Lab Results  Component Value Date   CREATININE 1.26 (H) 09/20/2023   CREATININE 1.24 05/05/2023   CREATININE 1.12 04/23/2023    Recent Labs  Lab 09/20/23 1733  AST 30  ALT 21  ALKPHOS 62  BILITOT 0.4  PROT 7.7  ALBUMIN 2.3*   Lab Results  Component Value Date   CALCIUM 9.5 09/20/2023   PHOS 2.4 (L) 04/08/2022        Plt: Lab Results  Component Value Date   PLT 318 09/20/2023       Recent Labs  Lab 09/20/23 1733 09/20/23 1931  WBC 6.5  --   NEUTROABS 5.0  --   HGB 9.3* 8.5*  HCT 31.8* 25.0*  MCV 89.1  --   PLT 318  --     HG/HCT    Down   from baseline see below    Component Value Date/Time   HGB 8.5 (L) 09/20/2023 1931   HGB 9.4 (L) 05/05/2023 1102   HCT 25.0 (L) 09/20/2023  1931   HCT 29.0 (L) 05/05/2023 1102   MCV 89.1 09/20/2023 1733   MCV 92 05/05/2023 1102      Recent Labs  Lab 09/20/23 1733  LIPASE 52*     _______________________________________________ Hospitalist was called for admission for   Dehydration   Multifocal pneumonia  Hypothyroidism, unspecified type     The following Work up has been ordered so far:  Orders Placed This Encounter  Procedures  . Culture, blood (Routine X 2) w Reflex to ID Panel  . Resp panel by RT-PCR (RSV, Flu A&B, Covid) Anterior Nasal Swab  . DG Chest Port 1 View  . CT Head Wo Contrast  . CT Cervical Spine Wo Contrast  . CT Maxillofacial WO CM  . CT CHEST ABDOMEN PELVIS W CONTRAST  . Comprehensive metabolic panel  . Lipase, blood  . CBC with Differential/Platelet  . Urinalysis, Routine w reflex microscopic -Urine, Clean Catch  . TSH  . Vital signs  . Consult to hospitalist  . Airborne and Contact precautions  . I-Stat Lactic Acid  . I-Stat venous blood gas, ([facility], [facility], [facility])  . ED EKG  . EKG 12-Lead  . Saline lock IV     OTHER Significant initial  Findings:  labs showing:     DM  labs:  HbA1C: Recent Labs    04/17/23 1831  HGBA1C 5.4       CBG (last 3)  No results for input(s): "GLUCAP" in the last 72 hours.        Cultures:    Component Value Date/Time  SDES  04/16/2023 1000    BLOOD RIGHT ARM Performed at [facility], [address] [place], [place] [address]    SPECREQUEST  04/16/2023 1000    BOTTLES DRAWN AEROBIC ONLY Blood Culture adequate volume Performed at [name], [address], [place], [place] [address]    CULT  04/16/2023 1000    NO GROWTH 5 DAYS Performed at [name], [address], [place], [place] [address]    REPTSTATUS 04/21/2023 FINAL 04/16/2023 1000     Radiological Exams on Admission: CT Head Wo Contrast Result Date: 09/20/2023 CLINICAL DATA:  Altered mental status EXAM: CT HEAD WITHOUT CONTRAST  TECHNIQUE: Contiguous axial images were obtained from the base of the skull through the vertex without intravenous contrast. RADIATION DOSE REDUCTION: This exam was performed according to the departmental dose-optimization program which includes automated exposure control, adjustment of the mA and/or kV according to patient size and/or use of iterative reconstruction technique. COMPARISON:  04/16/2019 FINDINGS: Brain: No evidence of acute infarction, hemorrhage, hydrocephalus, extra-axial collection or mass lesion/mass effect. Cavum septum pellucidum is again seen, normal variant. Scattered atrophic and chronic white matter ischemic changes are noted. Vascular: No hyperdense vessel or unexpected calcification. Skull: Normal. Negative for fracture or focal lesion. Sinuses/Orbits: Mild mucosal changes are noted in the sphenoid and ethmoid sinuses. Other: None IMPRESSION: Chronic atrophic and ischemic changes are noted. Electronically Signed   By: Alcide Clever M.D.   On: 09/20/2023 22:25   CT Cervical Spine Wo Contrast Result Date: 09/20/2023 CLINICAL DATA:  Neck trauma (Age >= 65y); Facial trauma, blunt EXAM: CT MAXILLOFACIAL WITHOUT CONTRAST CT CERVICAL SPINE WITHOUT CONTRAST TECHNIQUE: Multidetector CT imaging of the maxillofacial structures was performed. Multiplanar CT image reconstructions were also generated. A small metallic BB was placed on the right temple in order to reliably differentiate right from left. Multidetector CT imaging of the cervical spine was performed without intravenous contrast. Multiplanar CT image reconstructions were also generated. RADIATION DOSE REDUCTION: This exam was performed according to the departmental dose-optimization program which includes automated exposure control, adjustment of the mA and/or kV according to patient size and/or use of iterative reconstruction technique. COMPARISON:  CT head 09/20/2023, CT head 04/01/2022, CT head 01/02/2017. FINDINGS: CT MAXILLOFACIAL  FINDINGS Osseous: No fracture or mandibular dislocation. No destructive process. 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 Orbits: Negative. No traumatic or inflammatory finding. Sinuses: Bilateral sphenoid, left ethmoid, left maxillary, left frontal mucosal thickening. Paranasal sinuses otherwise clear. Mastoid air cells are clear. Some interval sclerosis of the left mastoid temporal bone. Soft tissues: Negative. Limited intracranial: Please see separately dictated CT head 09/20/2023. CT CERVICAL FINDINGS Alignment: Normal. Skull base and vertebrae: Multilevel severe degenerative changes of the spine. No associated severe osseous neural foraminal or central canal stenosis. No acute fracture. No aggressive appearing focal osseous lesion or focal pathologic process. Soft tissues and spinal canal: No prevertebral fluid or swelling. No visible canal hematoma. Upper chest: Unremarkable. Other: Atherosclerotic plaque of the carotid arteries within the neck. IMPRESSION: 1. No acute displaced facial fracture. 2. No acute displaced fracture or traumatic listhesis of the cervical spine. Electronically Signed   By: Tish Frederickson M.D.   On: 09/20/2023 21:47   CT Maxillofacial WO CM Result Date: 09/20/2023 CLINICAL DATA:  Neck trauma (Age >= 65y); Facial trauma, blunt EXAM: CT MAXILLOFACIAL WITHOUT CONTRAST CT CERVICAL SPINE WITHOUT CONTRAST TECHNIQUE: Multidetector CT imaging of the maxillofacial structures was performed. Multiplanar CT image  reconstructions were also generated. A small metallic BB was placed on the right temple in order to reliably differentiate right from left. Multidetector CT imaging of the cervical spine was performed without intravenous contrast. Multiplanar CT image reconstructions were also generated. RADIATION DOSE REDUCTION: This exam was performed according to the departmental dose-optimization program which includes automated exposure control,  adjustment of the mA and/or kV according to patient size and/or use of iterative reconstruction technique. COMPARISON:  CT head 09/20/2023, CT head 04/01/2022, CT head 01/02/2017. FINDINGS: CT MAXILLOFACIAL FINDINGS Osseous: No fracture or mandibular dislocation. No destructive process. 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 Orbits: Negative. No traumatic or inflammatory finding. Sinuses: Bilateral sphenoid, left ethmoid, left maxillary, left frontal mucosal thickening. Paranasal sinuses otherwise clear. Mastoid air cells are clear. Some interval sclerosis of the left mastoid temporal bone. Soft tissues: Negative. Limited intracranial: Please see separately dictated CT head 09/20/2023. CT CERVICAL FINDINGS Alignment: Normal. Skull base and vertebrae: Multilevel severe degenerative changes of the spine. No associated severe osseous neural foraminal or central canal stenosis. No acute fracture. No aggressive appearing focal osseous lesion or focal pathologic process. Soft tissues and spinal canal: No prevertebral fluid or swelling. No visible canal hematoma. Upper chest: Unremarkable. Other: Atherosclerotic plaque of the carotid arteries within the neck. IMPRESSION: 1. No acute displaced facial fracture. 2. No acute displaced fracture or traumatic listhesis of the cervical spine. Electronically Signed   By: Tish Frederickson M.D.   On: 09/20/2023 21:47   CT CHEST ABDOMEN PELVIS W CONTRAST Result Date: 09/20/2023 CLINICAL DATA:  Sepsis EXAM: CT CHEST, ABDOMEN, AND PELVIS WITH CONTRAST TECHNIQUE: Multidetector CT imaging of the chest, abdomen and pelvis was performed following the standard protocol during bolus administration of intravenous contrast. RADIATION DOSE REDUCTION: This exam was performed according to the departmental dose-optimization program which includes automated exposure control, adjustment of the mA and/or kV according to patient size and/or use of  iterative reconstruction technique. CONTRAST:  75mL OMNIPAQUE IOHEXOL 350 MG/ML SOLN COMPARISON:  CT abdomen pelvis 04/16/2023, CT angiography chest 12/10/2021 FINDINGS: CHEST: Cardiovascular: No aortic injury. The thoracic aorta is normal in caliber. The heart is normal in size. No significant pericardial effusion. No central or segmental pulmonary embolus. Limited evaluation more distally due to timing of contrast. The main pulmonary artery measures at the upper limits of normal. At least moderate atherosclerotic plaque of the aorta. Four-vessel coronary calcification. Mediastinum/Nodes: No pneumomediastinum. No mediastinal hematoma. The esophagus is unremarkable. The thyroid is unremarkable. The central airways are patent. No mediastinal, hilar, or axillary lymphadenopathy. Lungs/Pleura: Emphysematous changes. Patchy left upper lobe and left lower lobe airspace opacities. Similar finding within the right lower lobe. Stable several scattered pulmonary micronodules-no further follow-up indicated. Debris within the left lower lobe bronchials (25:41). No pulmonary mass. No pulmonary contusion or laceration. No pneumatocele formation. No pleural effusion. No pneumothorax. No hemothorax. Musculoskeletal/Chest wall: No chest wall mass. No acute rib or sternal fracture.Diffusely decreased bone density. Multilevel severe degenerative changes of the spine. Chronic multilevel mild thoracic vertebral body height loss. Severe degenerative changes of the shoulders. Old nonunionized fracture of the left humeral neck. Old chronic left glenohumeral joint subluxation. ABDOMEN / PELVIS: Hepatobiliary: Not enlarged. No focal lesion. No laceration or subcapsular hematoma. The gallbladder is otherwise unremarkable with no radio-opaque gallstones. No biliary ductal dilatation. Pancreas: Normal pancreatic contour. No main pancreatic duct dilatation. Spleen: Not enlarged. No focal lesion. No laceration, subcapsular hematoma, or vascular  injury. Adrenals/Urinary Tract: No nodularity  bilaterally. Bilateral kidneys enhance symmetrically. No hydronephrosis. No contusion, laceration, or subcapsular hematoma. No injury to the vascular structures or collecting systems. No hydroureter. The urinary bladder is unremarkable. Stomach/Bowel: Gastrostomy tube with tip and inflated balloon terminating within the gastric lumen. No small or large bowel wall thickening or dilatation. Increased stool burden throughout the colon. A 7 cm rectal stool ball. The appendix is unremarkable. Vasculature/Lymphatics: No abdominal aorta or iliac aneurysm. No active contrast extravasation or pseudoaneurysm. No abdominal, pelvic, inguinal lymphadenopathy. Reproductive: Prostate enlarged measuring up to 5.4 cm. Question retractile bilateral testis noted along the inguinal canal. Other: No simple free fluid ascites. No pneumoperitoneum. No hemoperitoneum. No mesenteric hematoma identified. No organized fluid collection. Musculoskeletal: No significant soft tissue hematoma. No acute pelvic fracture. No spinal fracture. Diffusely decreased bone density. Multilevel severe degenerative changes of the spine. L4-L5 posterolateral interbody surgical hardware fusion. No CT evidence of surgical hardware complication. Other ports and devices: None. IMPRESSION: 1. Multifocal pneumonia with debris noted within the left lower bronchials. Recommend follow-up CT in 3 months to evaluate for resolution exclude underlying masses. 2. No central or segmental pulmonary embolus. Limited evaluation more distally due to timing of contrast. 3. Constipation with 7 cm rectal stool ball. 4. Gastrostomy tube in good position. 5. Aortic Atherosclerosis (ICD10-I70.0) including four-vessel coronary calcification. 6.  Emphysema (ICD10-J43.9). 7. Question retractile bilateral testis noted along the inguinal canal. Recommend correlation with physical exam. Electronically Signed   By: Tish Frederickson M.D.   On:  09/20/2023 21:34   DG Chest Port 1 View Result Date: 09/20/2023 CLINICAL DATA:  Syncope and subsequent fall. EXAM: PORTABLE CHEST 1 VIEW COMPARISON:  April 16, 2023 FINDINGS: The heart size and mediastinal contours are within normal limits. The lungs are hyperinflated. Mild areas of scarring, atelectasis and/or infiltrate are seen within the mid left lung and right lung base. No pleural effusion or pneumothorax is identified. A chronic left humeral neck fracture is noted. Multilevel degenerative changes are seen throughout the thoracic spine. IMPRESSION: Mild mid left lung and right basilar scarring, atelectasis and/or infiltrate. Electronically Signed   By: Aram Candela M.D.   On: 09/20/2023 19:04   _______________________________________________________________________________________________________ Latest  Blood pressure (!) 159/89, pulse 86, temperature 98.1 F (36.7 C), temperature source Tympanic, resp. rate (!) 22, SpO2 100%.   Vitals  labs and radiology finding personally reviewed  Review of Systems:    Pertinent positives include:   fatigue, weight loss   Constitutional:  No weight loss, night sweats, Fevers, chills, HEENT:  No headaches, Difficulty swallowing,Tooth/dental problems,Sore throat,  No sneezing, itching, ear ache, nasal congestion, post nasal drip,  Cardio-vascular:  No chest pain, Orthopnea, PND, anasarca, dizziness, palpitations.no Bilateral lower extremity swelling  GI:  No heartburn, indigestion, abdominal pain, nausea, vomiting, diarrhea, change in bowel habits, loss of appetite, melena, blood in stool, hematemesis Resp:  no shortness of breath at rest. No dyspnea on exertion, No excess mucus, no productive cough, No non-productive cough, No coughing up of blood.No change in color of mucus.No wheezing. Skin:  no rash or lesions. No jaundice GU:  no dysuria, change in color of urine, no urgency or frequency. No straining to urinate.  No flank pain.   Musculoskeletal:  No joint pain or no joint swelling. No decreased range of motion. No back pain.  Psych:  No change in mood or affect. No depression or anxiety. No memory loss.  Neuro: no localizing neurological complaints, no tingling, no weakness, no double vision, no gait abnormality, no  slurred speech, no confusion  All systems reviewed and apart from HOPI all are negative _______________________________________________________________________________________________ Past Medical History:   Past Medical History:  Diagnosis Date  . Back pain 10/2008   per MRI 11/06/2008 - severe spinal stenosis worse at L4-5, L3-4, with neural compression at those levels  . BPH (benign prostatic hyperplasia)   . Eczema   . Gout   . Hypertension   . Hypothyroidism       Past Surgical History:  Procedure Laterality Date  . ESOPHAGOGASTRODUODENOSCOPY N/A 04/03/2022   Procedure: ESOPHAGOGASTRODUODENOSCOPY (EGD);  Surgeon: Jeani Hawking, MD;  Location[facility] ENDOSCOPY;  Service: Gastroenterology;  Laterality: N/A;  . LAMINECTOMY  12/11/2008    Bilateral L2, L3, and L4 laminotomies, done by Dr. Lovell Sheehan  . LAPAROSCOPY N/A 04/06/2022   Procedure: LAPAROSCOPIC ASSISTED PERCUTANEOUS ENDOSCOPIC GASTROSTOMY PLACEMENT;  Surgeon: Manus Rudd, MD;  Location: [facility] OR;  Service: General;  Laterality: N/A;    Social History:  Ambulatory *** independently cane, walker  wheelchair bound, bed bound     reports that he has never smoked. He has never used smokeless tobacco. He reports that he does not drink alcohol and does not use drugs.     Family History:   Family History  Problem Relation Age of Onset  . Stroke Neg Hx   . Cancer Neg Hx    ______________________________________________________________________________________________ Allergies: No Known Allergies   Prior to Admission medications   Medication Sig Start Date End Date Taking? Authorizing Provider  Iron, Ferrous Sulfate, 325 (65 Fe)  MG TABS Take 1 tablet (325 mg) by mouth every other day. Please make an appointment with Dr. Laural Benes for more refills. Patient taking differently: Place 325 mg into feeding tube every other day. 04/10/23  Yes Marcine Matar, MD  levothyroxine (SYNTHROID) 75 MCG tablet Take 1 tablet (75 mcg total) by mouth daily before breakfast. 04/12/23  Yes Marcine Matar, MD  allopurinol (ZYLOPRIM) 100 MG tablet Take 1 tablet (100 mg total) by mouth daily. Patient not taking: Reported on 09/20/2023 06/16/23   Marcine Matar, MD  metoprolol tartrate (LOPRESSOR) 25 MG tablet Place 0.5 tablets (12.5 mg total) into feeding tube 2 (two) times daily. STOP BISOPROLOL Patient not taking: Reported on 09/20/2023 05/05/23   Marcine Matar, MD  saccharomyces boulardii (FLORASTOR) 250 MG capsule Place 1 capsule (250 mg total) into feeding tube 2 (two) times daily. Patient not taking: Reported on 09/20/2023 04/23/23   Leatha Gilding, MD  Water For Irrigation, Sterile (FREE WATER) SOLN 50 mL free water flush via PEG tube before and after each bolus 04/08/22   Maretta Bees, MD    ___________________________________________________________________________________________________ Physical Exam:    09/20/2023    9:20 PM 09/20/2023    6:40 PM 09/20/2023    5:00 PM  Vitals with BMI  Systolic 159 155 782  Diastolic 89 98 73  Pulse 86 90 100     1. General:  in No ***Acute distress***increased work of breathing ***complaining of severe pain****agitated * Chronically ill *well *cachectic *toxic acutely ill -appearing 2. Psychological: Alert and *** Oriented 3. Head/ENT:   Moist *** Dry Mucous Membranes                          Head Non traumatic, neck supple                          Normal *** Poor Dentition 4.  SKIN: normal *** decreased Skin turgor,  Skin clean Dry and intact no rash    5. Heart: Regular rate and rhythm no*** Murmur, no Rub or gallop 6. Lungs: ***Clear to auscultation bilaterally,  no wheezes or crackles   7. Abdomen: Soft, ***non-tender, Non distended *** obese ***bowel sounds present 8. Lower extremities: no clubbing, cyanosis, no ***edema 9. Neurologically Grossly intact, moving all 4 extremities equally *** strength 5 out of 5 in all 4 extremities cranial nerves II through XII intact 10. MSK: Normal range of motion    Chart has been reviewed  ______________________________________________________________________________________________  Assessment/Plan  ***  Admitted for *** Dehydration ***  Failure to thrive (child) ***  Multifocal pneumonia ***  Hypothyroidism, unspecified type ***    Present on Admission: **None**     No problem-specific Assessment & Plan notes found for this encounter.    Other plan as per orders.  DVT prophylaxis:  SCD *** Lovenox       Code Status:    Code Status: Prior FULL CODE *** DNR/DNI ***comfort care as per patient ***family  I had personally discussed CODE STATUS with patient and family*  ACP *** none has been reviewed ***   Family Communication:   Family not at  Bedside  plan of care was discussed on the phone with *** Son, Daughter, Wife, Husband, Sister, Brother , father, mother  Diet    Disposition Plan:   *** likely will need placement for rehabilitation                          Back to current facility when stable                            To home once workup is complete and patient is stable  ***Following barriers for discharge:                             Chest pain *** Stroke *** work up is complete                            Electrolytes corrected                               Anemia corrected h/H stable                             Pain controlled with PO medications                               Afebrile, white count improving able to transition to PO antibiotics                             Will need to be able to tolerate PO                            Will likely need home health,  home O2, set up                           Will need consultants  to evaluate patient prior to discharge       Consult Orders  (From admission, onward)           Start     Ordered   09/20/23 2227  Consult to hospitalist  PG SENT BY DELORIS  Once       Provider:  (Not yet assigned)  Question Answer Comment  Place call to: [facility] [id number] failure to thrive, dehydration, multifocal pneumonia   Reason for Consult Admit      09/20/23 2226                              ***Would benefit from PT/OT eval prior to DC  Ordered                   Swallow eval - SLP ordered                   Diabetes care coordinator                   Transition of care consulted                   Nutrition    consulted                  Wound care  consulted                   Palliative care    consulted                   Behavioral health  consulted                    Consults called: ***     Admission status:  ED Disposition     ED Disposition  Admit   Condition  --   Comment  The patient appears reasonably stabilized for admission considering the current resources, flow, and capabilities available in the ED at this time, and I doubt any other [facility] requiring further screening and/or treatment in the ED prior to admission is  present.           Obs***  ***  inpatient     I Expect 2 midnight stay secondary to severity of patient's current illness need for inpatient interventions justified by the following: ***hemodynamic instability despite optimal treatment (tachycardia *hypotension * tachypnea *hypoxia, hypercapnia) * Severe lab/radiological/exam abnormalities including:     and extensive comorbidities including: *substance abuse  *Chronic pain *DM2  * CHF * CAD  * COPD/asthma *Morbid Obesity * CKD *dementia *liver disease *history of stroke with residual deficits *  malignancy, * sickle cell disease  History of amputation Chronic anticoagulation  That are  currently affecting medical management.   I expect  patient to be hospitalized for 2 midnights requiring inpatient medical care.  Patient is at high risk for adverse outcome (such as loss of life or disability) if not treated.  Indication for inpatient stay as follows:  Severe change from baseline regarding mental status Hemodynamic instability despite maximal medical therapy,  ongoing suicidal ideations,  severe pain requiring acute inpatient management,  inability to maintain oral hydration   persistent chest pain despite medical management Need for operative/procedural  intervention New or worsening hypoxia   Need for IV antibiotics, IV fluids, IV rate controling medications, IV antihypertensives, IV pain medications, IV anticoagulation, need for biPAP  Level of care   *** tele  For 12H 24H     medical floor       progressive     stepdown   tele indefinitely please discontinue once patient no longer qualifies COVID-19 Labs      Nariah Morgano 09/20/2023, 10:48 PM ***  [facility]     after 2 AM please page floor coverage PA If 7AM-7PM, please contact the day team taking care of the patient using [email or web address]

## 2023-09-20 NOTE — Assessment & Plan Note (Addendum)
 Unclear if has been compliant with his Synthroid severely elevated TSH Check  t3 T4 level Increase synthroid dose will need further management and follow up with endocrinology

## 2023-09-20 NOTE — ED Notes (Signed)
 Inpatient provider at bedside.

## 2023-09-20 NOTE — Assessment & Plan Note (Signed)
 Nutrition consult check prealbumin check magnesium and phosphate thiamine and replete

## 2023-09-20 NOTE — Assessment & Plan Note (Signed)
Will rehydrate and follow fluid status °

## 2023-09-20 NOTE — Assessment & Plan Note (Signed)
 Monitor for any sign of sundowning Delirium prevention

## 2023-09-20 NOTE — Assessment & Plan Note (Addendum)
 Chest CT concerning for aspiration pneumonia will change antibiotics to Unasyn continue azithromycin patient should be n.p.o. Having a PEG tube in place does not prevent aspiration unfortunately

## 2023-09-20 NOTE — ED Provider Notes (Addendum)
 [place] EMERGENCY DEPARTMENT AT [facility] Provider Note   CSN: [id number] Arrival date & time: 09/20/23  1649     History  Chief Complaint  Patient presents with   Loss of Consciousness    [name] [name] is a 89 y.o. male.  Family member here patient is noncommunicative.  Family member states he has been in the state for about a month.  He started tube feedings not too long ago.  They had contacted primary and resources to try to get them renewed he ran out yesterday.  They called EMS because they were told to bring him in because he did not have any tube feedings.  No real change in his status.  Apparently's been moaning like he is currently for the past month.  Family no longer able to care for him at home.  Past medical history is never hypotension hypothyroidism back pain eczema.  Past surgical history significant for laminectomy.  Patient last seen in the emergency department October 2024 for COVID-19.  Patient technically does not meet sepsis criteria and that he is not febrile.  But respiratory rate was up some.  He is not hypotensive.  EMS said blood sugar was 145.  According to family his mental status and dementia is at baseline.  Patient appears very dehydrated.       Home Medications Prior to Admission medications   Medication Sig Start Date End Date Taking? Authorizing Provider  Iron, Ferrous Sulfate, 325 (65 Fe) MG TABS Take 1 tablet (325 mg) by mouth every other day. Please make an appointment with Dr. Laural Benes for more refills. Patient taking differently: Place 325 mg into feeding tube every other day. 04/10/23  Yes Marcine Matar, MD  levothyroxine (SYNTHROID) 75 MCG tablet Take 1 tablet (75 mcg total) by mouth daily before breakfast. 04/12/23  Yes Marcine Matar, MD  allopurinol (ZYLOPRIM) 100 MG tablet Take 1 tablet (100 mg total) by mouth daily. Patient not taking: Reported on 09/20/2023 06/16/23   Marcine Matar, MD  metoprolol tartrate  (LOPRESSOR) 25 MG tablet Place 0.5 tablets (12.5 mg total) into feeding tube 2 (two) times daily. STOP BISOPROLOL Patient not taking: Reported on 09/20/2023 05/05/23   Marcine Matar, MD  saccharomyces boulardii (FLORASTOR) 250 MG capsule Place 1 capsule (250 mg total) into feeding tube 2 (two) times daily. Patient not taking: Reported on 09/20/2023 04/23/23   Leatha Gilding, MD  Water For Irrigation, Sterile (FREE WATER) SOLN 50 mL free water flush via PEG tube before and after each bolus 04/08/22   Ghimire, Werner Lean, MD      Allergies    Patient has no known allergies.    Review of Systems   Review of Systems  Unable to perform ROS: Mental status change    Physical Exam Updated Vital Signs BP (!) 159/89 (BP Location: Right Arm)   Pulse 86   Temp 98.1 F (36.7 C) (Tympanic)   Resp (!) 22   SpO2 100%  Physical Exam Constitutional:      Appearance: He is ill-appearing.     Comments: Cachectic  HENT:     Mouth/Throat:     Mouth: Mucous membranes are dry.  Cardiovascular:     Rate and Rhythm: Tachycardia present.  Pulmonary:     Effort: No respiratory distress.     Breath sounds: Rhonchi present. No rales.  Abdominal:     General: Abdomen is flat.     Comments: G-tube feeding tube left side  of abdomen.  Musculoskeletal:     Right lower leg: No edema.     Left lower leg: No edema.  Neurological:     Mental Status: Mental status is at baseline.     Comments: The patient nonverbal some spontaneous movement of lower and upper extremities.     ED Results / Procedures / Treatments   Labs (all labs ordered are listed, but only abnormal results are displayed) Labs Reviewed  COMPREHENSIVE METABOLIC PANEL - Abnormal; Notable for the following components:      Result Value   Potassium 5.3 (*)    Chloride 113 (*)    Glucose, Bld 136 (*)    BUN 66 (*)    Creatinine, Ser 1.26 (*)    Albumin 2.3 (*)    GFR, Estimated 55 (*)    All other components within normal limits   LIPASE, BLOOD - Abnormal; Notable for the following components:   Lipase 52 (*)    All other components within normal limits  CBC WITH DIFFERENTIAL/PLATELET - Abnormal; Notable for the following components:   RBC 3.57 (*)    Hemoglobin 9.3 (*)    HCT 31.8 (*)    MCHC 29.2 (*)    RDW 16.6 (*)    All other components within normal limits  TSH - Abnormal; Notable for the following components:   TSH 68.884 (*)    All other components within normal limits  I-STAT CG4 LACTIC ACID, ED - Abnormal; Notable for the following components:   Lactic Acid, Venous 2.1 (*)    All other components within normal limits  I-STAT VENOUS BLOOD GAS, ED - Abnormal; Notable for the following components:   pCO2, Ven 40.0 (*)    Potassium 6.2 (*)    HCT 25.0 (*)    Hemoglobin 8.5 (*)    All other components within normal limits  TROPONIN I (HIGH SENSITIVITY) - Abnormal; Notable for the following components:   Troponin I (High Sensitivity) 122 (*)    All other components within normal limits  TROPONIN I (HIGH SENSITIVITY) - Abnormal; Notable for the following components:   Troponin I (High Sensitivity) 115 (*)    All other components within normal limits  CULTURE, BLOOD (ROUTINE X 2)  CULTURE, BLOOD (ROUTINE X 2)  RESP PANEL BY RT-PCR (RSV, FLU A&B, COVID)  RVPGX2  URINALYSIS, ROUTINE W REFLEX MICROSCOPIC  I-STAT CG4 LACTIC ACID, ED    EKG EKG Interpretation Date/Time:  Wednesday September 20 2023 17:38:14 EDT Ventricular Rate:  90 PR Interval:  174 QRS Duration:  90 QT Interval:  368 QTC Calculation: 451 R Axis:   83  Text Interpretation: Sinus rhythm Artifact in lead(s) II III aVF V1 V2 V4 V5 V6 Interpretation limited secondary to artifact Confirmed by Vanetta Mulders [phone]) on 09/20/2023 5:41:13 PM  Radiology CT Cervical Spine Wo Contrast Result Date: 09/20/2023 CLINICAL DATA:  Neck trauma (Age >= 65y); Facial trauma, blunt EXAM: CT MAXILLOFACIAL WITHOUT CONTRAST CT CERVICAL SPINE WITHOUT  CONTRAST TECHNIQUE: Multidetector CT imaging of the maxillofacial structures was performed. Multiplanar CT image reconstructions were also generated. A small metallic BB was placed on the right temple in order to reliably differentiate right from left. Multidetector CT imaging of the cervical spine was performed without intravenous contrast. Multiplanar CT image reconstructions were also generated. RADIATION DOSE REDUCTION: This exam was performed according to the departmental dose-optimization program which includes automated exposure control, adjustment of the mA and/or kV according to patient size and/or use of iterative reconstruction  technique. COMPARISON:  CT head 09/20/2023, CT head 04/01/2022, CT head 01/02/2017. FINDINGS: CT MAXILLOFACIAL FINDINGS Osseous: No fracture or mandibular dislocation. No destructive process. 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 Orbits: Negative. No traumatic or inflammatory finding. Sinuses: Bilateral sphenoid, left ethmoid, left maxillary, left frontal mucosal thickening. Paranasal sinuses otherwise clear. Mastoid air cells are clear. Some interval sclerosis of the left mastoid temporal bone. Soft tissues: Negative. Limited intracranial: Please see separately dictated CT head 09/20/2023. CT CERVICAL FINDINGS Alignment: Normal. Skull base and vertebrae: Multilevel severe degenerative changes of the spine. No associated severe osseous neural foraminal or central canal stenosis. No acute fracture. No aggressive appearing focal osseous lesion or focal pathologic process. Soft tissues and spinal canal: No prevertebral fluid or swelling. No visible canal hematoma. Upper chest: Unremarkable. Other: Atherosclerotic plaque of the carotid arteries within the neck. IMPRESSION: 1. No acute displaced facial fracture. 2. No acute displaced fracture or traumatic listhesis of the cervical spine. Electronically Signed   By: Tish Frederickson M.D.    On: 09/20/2023 21:47   CT Maxillofacial WO CM Result Date: 09/20/2023 CLINICAL DATA:  Neck trauma (Age >= 65y); Facial trauma, blunt EXAM: CT MAXILLOFACIAL WITHOUT CONTRAST CT CERVICAL SPINE WITHOUT CONTRAST TECHNIQUE: Multidetector CT imaging of the maxillofacial structures was performed. Multiplanar CT image reconstructions were also generated. A small metallic BB was placed on the right temple in order to reliably differentiate right from left. Multidetector CT imaging of the cervical spine was performed without intravenous contrast. Multiplanar CT image reconstructions were also generated. RADIATION DOSE REDUCTION: This exam was performed according to the departmental dose-optimization program which includes automated exposure control, adjustment of the mA and/or kV according to patient size and/or use of iterative reconstruction technique. COMPARISON:  CT head 09/20/2023, CT head 04/01/2022, CT head 01/02/2017. FINDINGS: CT MAXILLOFACIAL FINDINGS Osseous: No fracture or mandibular dislocation. No destructive process. 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 Orbits: Negative. No traumatic or inflammatory finding. Sinuses: Bilateral sphenoid, left ethmoid, left maxillary, left frontal mucosal thickening. Paranasal sinuses otherwise clear. Mastoid air cells are clear. Some interval sclerosis of the left mastoid temporal bone. Soft tissues: Negative. Limited intracranial: Please see separately dictated CT head 09/20/2023. CT CERVICAL FINDINGS Alignment: Normal. Skull base and vertebrae: Multilevel severe degenerative changes of the spine. No associated severe osseous neural foraminal or central canal stenosis. No acute fracture. No aggressive appearing focal osseous lesion or focal pathologic process. Soft tissues and spinal canal: No prevertebral fluid or swelling. No visible canal hematoma. Upper chest: Unremarkable. Other: Atherosclerotic plaque of the carotid  arteries within the neck. IMPRESSION: 1. No acute displaced facial fracture. 2. No acute displaced fracture or traumatic listhesis of the cervical spine. Electronically Signed   By: Tish Frederickson M.D.   On: 09/20/2023 21:47   CT CHEST ABDOMEN PELVIS W CONTRAST Result Date: 09/20/2023 CLINICAL DATA:  Sepsis EXAM: CT CHEST, ABDOMEN, AND PELVIS WITH CONTRAST TECHNIQUE: Multidetector CT imaging of the chest, abdomen and pelvis was performed following the standard protocol during bolus administration of intravenous contrast. RADIATION DOSE REDUCTION: This exam was performed according to the departmental dose-optimization program which includes automated exposure control, adjustment of the mA and/or kV according to patient size and/or use of iterative reconstruction technique. CONTRAST:  75mL OMNIPAQUE IOHEXOL 350 MG/ML SOLN COMPARISON:  CT abdomen pelvis 04/16/2023, CT angiography chest 12/10/2021 FINDINGS: CHEST: Cardiovascular: No aortic injury. The thoracic aorta is normal in caliber. The heart is  normal in size. No significant pericardial effusion. No central or segmental pulmonary embolus. Limited evaluation more distally due to timing of contrast. The main pulmonary artery measures at the upper limits of normal. At least moderate atherosclerotic plaque of the aorta. Four-vessel coronary calcification. Mediastinum/Nodes: No pneumomediastinum. No mediastinal hematoma. The esophagus is unremarkable. The thyroid is unremarkable. The central airways are patent. No mediastinal, hilar, or axillary lymphadenopathy. Lungs/Pleura: Emphysematous changes. Patchy left upper lobe and left lower lobe airspace opacities. Similar finding within the right lower lobe. Stable several scattered pulmonary micronodules-no further follow-up indicated. Debris within the left lower lobe bronchials (25:41). No pulmonary mass. No pulmonary contusion or laceration. No pneumatocele formation. No pleural effusion. No pneumothorax. No  hemothorax. Musculoskeletal/Chest wall: No chest wall mass. No acute rib or sternal fracture.Diffusely decreased bone density. Multilevel severe degenerative changes of the spine. Chronic multilevel mild thoracic vertebral body height loss. Severe degenerative changes of the shoulders. Old nonunionized fracture of the left humeral neck. Old chronic left glenohumeral joint subluxation. ABDOMEN / PELVIS: Hepatobiliary: Not enlarged. No focal lesion. No laceration or subcapsular hematoma. The gallbladder is otherwise unremarkable with no radio-opaque gallstones. No biliary ductal dilatation. Pancreas: Normal pancreatic contour. No main pancreatic duct dilatation. Spleen: Not enlarged. No focal lesion. No laceration, subcapsular hematoma, or vascular injury. Adrenals/Urinary Tract: No nodularity bilaterally. Bilateral kidneys enhance symmetrically. No hydronephrosis. No contusion, laceration, or subcapsular hematoma. No injury to the vascular structures or collecting systems. No hydroureter. The urinary bladder is unremarkable. Stomach/Bowel: Gastrostomy tube with tip and inflated balloon terminating within the gastric lumen. No small or large bowel wall thickening or dilatation. Increased stool burden throughout the colon. A 7 cm rectal stool ball. The appendix is unremarkable. Vasculature/Lymphatics: No abdominal aorta or iliac aneurysm. No active contrast extravasation or pseudoaneurysm. No abdominal, pelvic, inguinal lymphadenopathy. Reproductive: Prostate enlarged measuring up to 5.4 cm. Question retractile bilateral testis noted along the inguinal canal. Other: No simple free fluid ascites. No pneumoperitoneum. No hemoperitoneum. No mesenteric hematoma identified. No organized fluid collection. Musculoskeletal: No significant soft tissue hematoma. No acute pelvic fracture. No spinal fracture. Diffusely decreased bone density. Multilevel severe degenerative changes of the spine. L4-L5 posterolateral interbody  surgical hardware fusion. No CT evidence of surgical hardware complication. Other ports and devices: None. IMPRESSION: 1. Multifocal pneumonia with debris noted within the left lower bronchials. Recommend follow-up CT in 3 months to evaluate for resolution exclude underlying masses. 2. No central or segmental pulmonary embolus. Limited evaluation more distally due to timing of contrast. 3. Constipation with 7 cm rectal stool ball. 4. Gastrostomy tube in good position. 5. Aortic Atherosclerosis (ICD10-I70.0) including four-vessel coronary calcification. 6.  Emphysema (ICD10-J43.9). 7. Question retractile bilateral testis noted along the inguinal canal. Recommend correlation with physical exam. Electronically Signed   By: Tish Frederickson M.D.   On: 09/20/2023 21:34   DG Chest Port 1 View Result Date: 09/20/2023 CLINICAL DATA:  Syncope and subsequent fall. EXAM: PORTABLE CHEST 1 VIEW COMPARISON:  April 16, 2023 FINDINGS: The heart size and mediastinal contours are within normal limits. The lungs are hyperinflated. Mild areas of scarring, atelectasis and/or infiltrate are seen within the mid left lung and right lung base. No pleural effusion or pneumothorax is identified. A chronic left humeral neck fracture is noted. Multilevel degenerative changes are seen throughout the thoracic spine. IMPRESSION: Mild mid left lung and right basilar scarring, atelectasis and/or infiltrate. Electronically Signed   By: Aram Candela M.D.   On: 09/20/2023 19:04    Procedures  Procedures    Medications Ordered in ED Medications  lactated ringers infusion ( Intravenous New Bag/Given 09/20/23 2152)  cefTRIAXone (ROCEPHIN) 1 g in sodium chloride 0.9 % 100 mL IVPB (has no administration in time range)  azithromycin (ZITHROMAX) 500 mg in sodium chloride 0.9 % 250 mL IVPB (has no administration in time range)  lactated ringers bolus 500 mL (0 mLs Intravenous Stopped 09/20/23 1800)  lactated ringers bolus 500 mL (0 mLs  Intravenous Stopped 09/20/23 2152)  iohexol (OMNIPAQUE) 350 MG/ML injection 75 mL (75 mLs Intravenous Contrast Given 09/20/23 2106)    ED Course/ Medical Decision Making/ A&P                                 Medical Decision Making Amount and/or Complexity of Data Reviewed Labs: ordered. Radiology: ordered.  Risk Prescription drug management. Decision regarding hospitalization.   Blood cultures drawn in case sepsis.  Lactic acid up some at 2.1 but white count was normal at 6.5.  Patient appears very dehydrated.  Hemoglobin 9.3 platelets 318.  Lipase up some at about 52 anion gap normal complete metabolic panel potassium 5.3 glucose 136 GFR 55 so renal functions reasonable.  LFTs are normal.  EKG does show some concerns for an inferior ischemia.  But a lot of it is artifact.  Troponins have been ordered and are pending.  Gave patient 500 cc of lactated Ringer's will start some maintenance of lactated Ringer's.  Other family member is here now.  Apparently he did have a fall at home.  There is a superficial abrasion laceration to the left side of the face.  I said that he had loss of consciousness or either passed out.  CT chest abdomen and pelvis without acute findings other than evidence of multifocal pneumonia.  Will check respiratory panel will start him on Rocephin and Zithromax.  Also there was a little bit evidence of constipation.  CT neck and CT maxillofacial without any acute findings.  Head CT still not completely read.  Initial lactic acid was elevated a little bit at 2.1 repeat 1.8.  Venous blood gas showed normal pH and normal pCO2 and pO2.  Blood cultures are pending.  Patient's initial troponin was 122 and repeat troponin was 115.  So no significant elevation.   Will contact hospitalist for admission.  CRITICAL CARE Performed by: Vanetta Mulders Total critical care time: 45 minutes Critical care time was exclusive of separately billable procedures and treating other  patients. Critical care was necessary to treat or prevent imminent or life-threatening deterioration. Critical care was time spent personally by me on the following activities: development of treatment plan with patient and/or surrogate as well as nursing, discussions with consultants, evaluation of patient's response to treatment, examination of patient, obtaining history from patient or surrogate, ordering and performing treatments and interventions, ordering and review of laboratory studies, ordering and review of radiographic studies, pulse oximetry and re-evaluation of patient's condition.  Patient does not technically meet sepsis criteria.  No leukocytosis.  Lactic acid originally at 2.1 but now normal. In addition, patient's TSH is [id number].  Suggestive of marked hypothyroidism.  Patient is post to be on Synthroid 75 mcg daily.  Not clear whether he has been getting that or not.  Some of his presentation could be hypothyroid coma.  Will discuss with hospitalist.  CT head without any acute findings.  Final Clinical Impression(s) / ED Diagnoses Final diagnoses:  Dehydration  Failure to thrive (child)  Multifocal pneumonia  Hypothyroidism, unspecified type    Rx / DC Orders ED Discharge Orders     None         Vanetta Mulders, MD 09/20/23 1610    Vanetta Mulders, MD 09/20/23 [id number]    Vanetta Mulders, MD 09/20/23 [id number]    Vanetta Mulders, MD 09/20/23 [id number]    Vanetta Mulders, MD 09/20/23 2229

## 2023-09-20 NOTE — Assessment & Plan Note (Signed)
 Once fluid resuscitated will need post void residual checked

## 2023-09-20 NOTE — Assessment & Plan Note (Addendum)
- -  Patient presenting with  productive cough  and infiltrate in   lower lobe on chest x-ray    will admit for treatment of CAP will start on appropriate antibiotic coverage. - Rocephin/azithromycin CT scan concerning for aspiration changed to Unasyn and azithromycin   Obtain:  sputum cultures,                 Influenza and COVID pending                  blood cultures and sputum cultures ordered                   strep pneumo UA antigen,                     check for Legionella antigen.                Provide oxygen as needed.

## 2023-09-20 NOTE — Assessment & Plan Note (Signed)
 No chest pain continue to follow on downtrending troponin echo in the morning

## 2023-09-20 NOTE — Assessment & Plan Note (Signed)
 Obtain anemia panel  Transfuse for Hg <7 , rapidly dropping or  if symptomatic

## 2023-09-20 NOTE — Assessment & Plan Note (Addendum)
 Fully dependent on PEG tube feedings Nutrition consult

## 2023-09-20 NOTE — Subjective & Objective (Signed)
 Patient presents from home with an episode of syncope and hitting head Patient has been deteriorating progressively at home. Has significant dementia at baseline completely tube feed dependent.  Family unable to pay for tube feedings going forward. He finally ran out of all his tube feedings Patient is completely dependent on them Patient has been moaning and has been ongoing for at least a month family states that they no longer able to take care of him at home patient found to be significantly dehydrated.

## 2023-09-20 NOTE — Assessment & Plan Note (Signed)
 Family states that patient falls out of bed frequently They interested in hospital bed at home to prevent his falls Report used to walk with a cane but no longer able to

## 2023-09-20 NOTE — H&P (Signed)
 [name] [name]:[id number] DOB: [birth year] DOA: 09/20/2023     PCP: Marcine Matar, MD   Outpatient Specialists: * NONE CARDS: * Dr. None  NEphrology: *  Dr. No care team member to display  NEurology *   Dr. Pulmonary *  Dr.  Oncology * Dr.No care team member to display  GI* Dr.  Deboraha Sprang, [facility]) No care team member to display Urology Dr. *  Patient arrived to ER on 09/20/23 at 1649 Referred by Attending Vanetta Mulders, MD   Patient coming from:    home Lives With family      Chief Complaint:   Chief Complaint  Patient presents with   Loss of Consciousness    HPI: [name] [name] is a 89 y.o. male with medical history significant of Dementia, eczema,  HTN, hypothyroidism, BPH, normocytic anemia, gout, venous insufficiency, dysphagia with PEG placement 03/2022,    Presented with  fall Patient presents from home with an episode of syncope and hitting head Patient has been deteriorating progressively at home. Has significant dementia at baseline completely tube feed dependent.  Family unable to pay for tube feedings going forward. He finally ran out of all his tube feedings Patient is completely dependent on them Patient has been moaning and has been ongoing for at least a month family states that they no longer able to take care of him at home patient found to be significantly dehydrated.      Denies significant ETOH intake   Does not smoke   Lab Results  Component Value Date   SARSCOV2NAA POSITIVE (A) 04/16/2023   SARSCOV2NAA NEGATIVE 04/01/2022   SARSCOV2NAA NEGATIVE 03/06/2022       Regarding pertinent Chronic problems:    ****Hyperlipidemia - *on statins {statin:315258}  Lipid Panel     Component Value Date/Time   CHOL 129 04/17/2023 1831   CHOL 166 12/07/2021 1200   TRIG 64 04/17/2023 1831   HDL 44 04/17/2023 1831   HDL 48 12/07/2021 1200   CHOLHDL 2.9 04/17/2023 1831   VLDL 13 04/17/2023 1831   LDLCALC 72 04/17/2023 1831   LDLCALC 104 (H) 12/07/2021  1200   LABVLDL 14 12/07/2021 1200    ***HTN on   ***chronic CHF diastolic/systolic/ combined - last echo*** Recent Results (from the past 81191 hours)  ECHOCARDIOGRAM COMPLETE   Collection Time: 04/17/23  2:37 PM  Result Value   Weight 1,844.81   Height 72   BP 96/51   S' Lateral 2.60   Area-P 1/2 3.13   Est EF 60 - 65%   Narrative      ECHOCARDIOGRAM REPORT       Patient Name:   [name] [name]  Date of Exam: 04/17/2023 Medical Rec #:  [id number]  Height:       72.0 in Accession #:    [id number] Weight:       115.3 lb Date of Birth:  [birth year]   BSA:          1.686 m Patient Age:    89 years   BP:           96/51 mmHg Patient Gender: M          HR:           115 bpm. Exam Location:  Inpatient  Procedure: 2D Echo, Color Doppler and Cardiac Doppler  Indications:    R94.31 Abnormal EKG   History:        Patient has no prior history of Echocardiogram examinations.  Risk Factors:Hypertension.   Sonographer:    Irving Burton Senior RDCS Referring Phys: [id number] CHING T TU    Sonographer Comments: Windows limited by thin body habitus. IMPRESSIONS    1. Left ventricular ejection fraction, by estimation, is 60 to 65%. The left ventricle has normal function. The left ventricle has no regional wall motion abnormalities. There is moderate concentric left ventricular hypertrophy. Left ventricular  diastolic parameters are consistent with Grade I diastolic dysfunction (impaired relaxation).  2. Right ventricular systolic function is normal. The right ventricular size is normal. Tricuspid regurgitation signal is inadequate for assessing PA pressure.  3. The mitral valve is normal in structure. Trivial mitral valve regurgitation.  4. The aortic valve is tricuspid. There is mild calcification of the aortic valve. Aortic valve regurgitation is not visualized. Aortic valve sclerosis/calcification is present, without any evidence of aortic stenosis.  5. The inferior vena cava is normal  in size with greater than 50% respiratory variability, suggesting right atrial pressure of 3 mmHg.  FINDINGS  Left Ventricle: Left ventricular ejection fraction, by estimation, is 60 to 65%. The left ventricle has normal function. The left ventricle has no regional wall motion abnormalities. The left ventricular internal cavity size was small. There is moderate  concentric left ventricular hypertrophy. Left ventricular diastolic parameters are consistent with Grade I diastolic dysfunction (impaired relaxation).  Right Ventricle: The right ventricular size is normal. No increase in right ventricular wall thickness. Right ventricular systolic function is normal. Tricuspid regurgitation signal is inadequate for assessing PA pressure.  Left Atrium: Left atrial size was normal in size.  Right Atrium: Right atrial size was normal in size.  Pericardium: Trivial pericardial effusion is present.  Mitral Valve: The mitral valve is normal in structure. Trivial mitral valve regurgitation.  Tricuspid Valve: The tricuspid valve is normal in structure. Tricuspid valve regurgitation is trivial.  Aortic Valve: The aortic valve is tricuspid. There is mild calcification of the aortic valve. Aortic valve regurgitation is not visualized. Aortic valve sclerosis/calcification is present, without any evidence of aortic stenosis.  Pulmonic Valve: The pulmonic valve was grossly normal. Pulmonic valve regurgitation is not visualized.  Aorta: The aortic root is normal in size and structure and the ascending aorta was not well visualized.  Venous: The inferior vena cava is normal in size with greater than 50% respiratory variability, suggesting right atrial pressure of 3 mmHg.  IAS/Shunts: No atrial level shunt detected by color flow Doppler.    LEFT VENTRICLE PLAX 2D LVIDd:         3.40 cm   Diastology LVIDs:         2.60 cm   LV e' medial:    4.35 cm/s LV PW:         1.40 cm   LV E/e' medial:  12.6 LV IVS:         1.10 cm   LV e' lateral:   5.22 cm/s LVOT diam:     2.00 cm   LV E/e' lateral: 10.5 LV SV:         45 LV SV Index:   26 LVOT Area:     3.14 cm    RIGHT VENTRICLE RV S prime:     12.10 cm/s TAPSE (M-mode): 1.7 cm  LEFT ATRIUM             Index        RIGHT ATRIUM           Index LA diam:  3.10 cm 1.84 cm/m   RA Area:     10.70 cm LA Vol (A2C):   30.1 ml 17.85 ml/m  RA Volume:   23.70 ml  14.06 ml/m LA Vol (A4C):   25.1 ml 14.89 ml/m LA Biplane Vol: 28.4 ml 16.85 ml/m  AORTIC VALVE LVOT Vmax:   88.60 cm/s LVOT Vmean:  61.500 cm/s LVOT VTI:    0.142 m   AORTA Ao Root diam: 2.90 cm  MITRAL VALVE MV Area (PHT): 3.13 cm    SHUNTS MV Decel Time: 242 msec    Systemic VTI:  0.14 m MV E velocity: 54.80 cm/s  Systemic Diam: 2.00 cm MV A velocity: 70.40 cm/s MV E/A ratio:  0.78  Arvilla Meres MD Electronically signed by Arvilla Meres MD Signature Date/Time: 04/17/2023/2:40:16 PM       Final     *Note: Due to a large number of results and/or encounters for the requested time period, some results have not been displayed. A complete set of results can be found in Results Review.    *** CAD  - On Aspirin, statin, betablocker, Plavix                 - *followed by cardiology                - last cardiac cath       ***DM 2 -  Lab Results  Component Value Date   HGBA1C 5.4 04/17/2023   ****on insulin, PO meds only, diet controlled  ***Hypothyroidism:   Lab Results  Component Value Date   TSH 68.884 (H) 09/20/2023   on synthroid  *** Morbid obesity-   BMI Readings from Last 1 Encounters:  05/05/23 16.68 kg/m     *** Asthma -well *** controlled on home inhalers/ nebs                     *** COPD - not **followed by pulmonology *** not  on baseline oxygen  *L,    *** OSA -on nocturnal oxygen, *CPAP, *noncompliant with CPAP  *** Hx of CVA - *with/out residual deficits on Aspirin 81 mg, 325, Plavix  ***A. Fib -   atrial fibrillation CHA2DS2  vas score **** CHA2DS2/VAS Stroke Risk Points      N/A >= 2 Points: High Risk  1 to 1.99 Points: Medium Risk  0 Points: Low Risk    Last Change: N/A      This score determines the patient's risk of having a stroke if the  patient has atrial fibrillation.      This score is not applicable to this patient. Components are not  calculated.     current  on anticoagulation with ****Coumadin  ***Xarelto,* Eliquis,  *** Not on anticoagulation secondary to Risk of Falls, *** recurrent bleeding         -  Rate control:  Currently controlled with ***Toprolol,  *Metoprolol,* Diltiazem, *Coreg          - Rhythm control: *** amiodarone, *flecainide  ***Hx of DVT/PE on - anticoagulation with ****Coumadin  ***Xarelto,* Eliquis,     ***CKD stage III*-   baseline Cr **** CrCl cannot be calculated (Unknown ideal weight.).  Lab Results  Component Value Date   CREATININE 1.26 (H) 09/20/2023   CREATININE 1.24 05/05/2023   CREATININE 1.12 04/23/2023   Lab Results  Component Value Date   NA 145 09/20/2023   CL 113 (H) 09/20/2023   K 6.2 (H) 09/20/2023  CO2 24 09/20/2023   BUN 66 (H) 09/20/2023   CREATININE 1.26 (H) 09/20/2023   GFRNONAA 55 (L) 09/20/2023   CALCIUM 9.5 09/20/2023   PHOS 2.4 (L) 04/08/2022   ALBUMIN 2.3 (L) 09/20/2023   GLUCOSE 136 (H) 09/20/2023    **** Liver disease Computed MELD 3.0 unavailable. One or more values for this score either were not found within the given timeframe or did not fit some other criterion. Computed MELD-Na unavailable. One or more values for this score either were not found within the given timeframe or did not fit some other criterion.  Hepatic Function Panel     Component Value Date/Time   PROT 7.7 09/20/2023 1733   PROT 6.9 04/10/2023 1641   ALBUMIN 2.3 (L) 09/20/2023 1733   ALBUMIN 3.8 04/10/2023 1641   AST 30 09/20/2023 1733   ALT 21 09/20/2023 1733   ALKPHOS 62 09/20/2023 1733   BILITOT 0.4 09/20/2023 1733   BILITOT 0.3 04/10/2023  1641   BILIDIR <0.1 08/30/2008 2250   IBILI NOT CALCULATED 08/30/2008 2250   No results found for requested labs within last 1095 days.  ***BPH - on Flomax, Proscar    *** Dementia - on Aricept** Nemenda  *** Chronic anemia - baseline hg Hemoglobin & Hematocrit  Recent Labs    05/05/23 1102 09/20/23 1733 09/20/23 1931  HGB 9.4* 9.3* 8.5*   Iron/TIBC/Ferritin/ %Sat    Component Value Date/Time   IRON 33 (L) 12/07/2021 1200   TIBC 184 (L) 12/07/2021 1200   FERRITIN 294 12/07/2021 1200   IRONPCTSAT 18 12/07/2021 1200     Seizure DO - las seizure *** currently on     Cancer:     While in ER:    CXR showed multifocal PNA     Lab Orders         Culture, blood (Routine X 2) w Reflex to ID Panel         Resp panel by RT-PCR (RSV, Flu A&B, Covid) Anterior Nasal Swab         Comprehensive metabolic panel         Lipase, blood         CBC with Differential/Platelet         Urinalysis, Routine w reflex microscopic -Urine, Clean Catch         TSH         I-Stat Lactic Acid         I-Stat venous blood gas, ([facility], [facility], [facility])      CT HEAD *** NON acute   MRI brain  ***no acute CVA  CXR - ***NON acute  CTabd/pelvis - ***nonacute  CTA chest - ***nonacute, no PE, * no evidence of infiltrate  Following Medications were ordered in ER: Medications  lactated ringers infusion ( Intravenous New Bag/Given 09/20/23 2152)  cefTRIAXone (ROCEPHIN) 1 g in sodium chloride 0.9 % 100 mL IVPB (has no administration in time range)  azithromycin (ZITHROMAX) 500 mg in sodium chloride 0.9 % 250 mL IVPB (has no administration in time range)  lactated ringers bolus 500 mL (0 mLs Intravenous Stopped 09/20/23 1800)  lactated ringers bolus 500 mL (0 mLs Intravenous Stopped 09/20/23 2152)  iohexol (OMNIPAQUE) 350 MG/ML injection 75 mL (75 mLs Intravenous Contrast Given 09/20/23 2106)    _______________________________________________________ ER Provider Called:       DrMarland Kitchen  They Recommend  admit to medicine *** Will see in AM  ***SEEN in ER     ED Triage Vitals [  09/20/23 1700]  Encounter Vitals Group     BP 136/73     Systolic BP Percentile      Diastolic BP Percentile      Pulse Rate 100     Resp (!) 26     Temp 98 F (36.7 C)     Temp Source Rectal     SpO2 95 %     Weight      Height      Head Circumference      Peak Flow      Pain Score      Pain Loc      Pain Education      Exclude from Growth Chart   TMAX(24)@     _________________________________________ Significant initial  Findings: Abnormal Labs Reviewed  COMPREHENSIVE METABOLIC PANEL - Abnormal; Notable for the following components:      Result Value   Potassium 5.3 (*)    Chloride 113 (*)    Glucose, Bld 136 (*)    BUN 66 (*)    Creatinine, Ser 1.26 (*)    Albumin 2.3 (*)    GFR, Estimated 55 (*)    All other components within normal limits  LIPASE, BLOOD - Abnormal; Notable for the following components:   Lipase 52 (*)    All other components within normal limits  CBC WITH DIFFERENTIAL/PLATELET - Abnormal; Notable for the following components:   RBC 3.57 (*)    Hemoglobin 9.3 (*)    HCT 31.8 (*)    MCHC 29.2 (*)    RDW 16.6 (*)    All other components within normal limits  TSH - Abnormal; Notable for the following components:   TSH 68.884 (*)    All other components within normal limits  I-STAT CG4 LACTIC ACID, ED - Abnormal; Notable for the following components:   Lactic Acid, Venous 2.1 (*)    All other components within normal limits  I-STAT VENOUS BLOOD GAS, ED - Abnormal; Notable for the following components:   pCO2, Ven 40.0 (*)    Potassium 6.2 (*)    HCT 25.0 (*)    Hemoglobin 8.5 (*)    All other components within normal limits  TROPONIN I (HIGH SENSITIVITY) - Abnormal; Notable for the following components:   Troponin I (High Sensitivity) 122 (*)    All other components within normal limits  TROPONIN I (HIGH SENSITIVITY) - Abnormal; Notable for the following  components:   Troponin I (High Sensitivity) 115 (*)    All other components within normal limits      _________________________ Troponin ***ordered Cardiac Panel (last 3 results) Recent Labs    09/20/23 1732 09/20/23 1925  TROPONINIHS 122* 115*     ECG: Ordered Personally reviewed and interpreted by me showing: HR : *** Rhythm: *NSR, Sinus tachycardia * A.fib. W RVR, RBBB, LBBB, Paced Ischemic changes*nonspecific changes, no evidence of ischemic changes QTC*  BNP (last 3 results) No results for input(s): "BNP" in the last 8760 hours.   COVID-19 Labs  No results for input(s): "DDIMER", "FERRITIN", "LDH", "CRP" in the last 72 hours.  Lab Results  Component Value Date   SARSCOV2NAA POSITIVE (A) 04/16/2023   SARSCOV2NAA NEGATIVE 04/01/2022   SARSCOV2NAA NEGATIVE 03/06/2022       ____________________ This patient meets SIRS Criteria and may be septic. SIRS = Systemic Inflammatory Response Syndrome  Order a lactic acid level if needed AND/OR Initiate the sepsis protocol with the attached order set OR Click "Treating Associated Infection or  Illness" if the patient is being treated for an infection that is a known cause of these abnormalities     The recent clinical data is shown below. Vitals:   09/20/23 1700 09/20/23 1840 09/20/23 2120  BP: 136/73 (!) 155/98 (!) 159/89  Pulse: 100 90 86  Resp: (!) 26 18 (!) 22  Temp: 98 F (36.7 C)  98.1 F (36.7 C)  TempSrc: Rectal  Tympanic  SpO2: 95% 91% 100%        WBC     Component Value Date/Time   WBC 6.5 09/20/2023 1733   LYMPHSABS 0.8 09/20/2023 1733   LYMPHSABS 1.9 03/10/2022 1143   MONOABS 0.5 09/20/2023 1733   EOSABS 0.2 09/20/2023 1733   EOSABS 0.2 03/10/2022 1143   BASOSABS 0.0 09/20/2023 1733   BASOSABS 0.1 03/10/2022 1143        Lactic Acid, Venous    Component Value Date/Time   LATICACIDVEN 1.8 09/20/2023 1932      Lactic Acid, Venous    Component Value Date/Time   LATICACIDVEN 1.8  09/20/2023 1932    Procalcitonin *** Ordered      UA *** no evidence of UTI  ***Pending ***not ordered   Urine analysis:    Component Value Date/Time   COLORURINE YELLOW 04/16/2023 1424   APPEARANCEUR CLEAR 04/16/2023 1424   LABSPEC 1.028 04/16/2023 1424   PHURINE 6.0 04/16/2023 1424   GLUCOSEU NEGATIVE 04/16/2023 1424   HGBUR TRACE (A) 04/16/2023 1424   BILIRUBINUR NEGATIVE 04/16/2023 1424   KETONESUR NEGATIVE 04/16/2023 1424   PROTEINUR TRACE (A) 04/16/2023 1424   UROBILINOGEN 0.2 12/03/2021 1921   NITRITE NEGATIVE 04/16/2023 1424   LEUKOCYTESUR NEGATIVE 04/16/2023 1424    Results for orders placed or performed during the hospital encounter of 04/16/23  Blood culture (routine x 2)     Status: None   Collection Time: 04/16/23  9:36 AM   Specimen: BLOOD  Result Value Ref Range Status   Specimen Description   Final    BLOOD RIGHT ANTECUBITAL Performed at [facility], [address] [place], [place] [address]    Special Requests   Final    BOTTLES DRAWN AEROBIC AND ANAEROBIC Blood Culture adequate volume Performed at [facility], [address] [place], [place] [address]    Culture   Final    NO GROWTH 5 DAYS Performed at [facility], [address] [place], [place] [address]    Report Status 04/21/2023 FINAL  Final  Blood culture (routine x 2)     Status: None   Collection Time: 04/16/23 10:00 AM   Specimen: BLOOD RIGHT ARM  Result Value Ref Range Status   Specimen Description   Final    BLOOD RIGHT ARM Performed at [facility], [address] [place], [place] [address]    Special Requests   Final    BOTTLES DRAWN AEROBIC ONLY Blood Culture adequate volume Performed at [facility], [address], [place], [place] [address]    Culture   Final    NO GROWTH 5 DAYS Performed at [facility], [address] [place], [place] [address]    Report Status 04/21/2023 FINAL  Final   Resp panel by RT-PCR (RSV, Flu A&B, Covid) Anterior Nasal Swab     Status: Abnormal   Collection Time: 04/16/23  2:59 PM   Specimen: Anterior Nasal Swab  Result Value Ref Range Status   SARS Coronavirus 2 by RT PCR POSITIVE (A)  NEGATIVE Final    Comment: (NOTE) SARS-CoV-2 target nucleic acids are DETECTED.  The SARS-CoV-2 RNA is generally detectable in upper respiratory specimens during the acute phase of infection. Positive results are indicative of the presence of the identified virus, but do not rule out bacterial infection or co-infection with other pathogens not detected by the test. Clinical correlation with patient history and other diagnostic information is necessary to determine patient infection status. The expected result is Negative.  Fact Sheet for Patients: BloggerCourse.com  Fact Sheet for Healthcare Providers: SeriousBroker.it  This test is not yet approved or cleared by the [place] FDA and  has been authorized for detection and/or diagnosis of SARS-CoV-2 by FDA under an Emergency Use Authorization (EUA).  This EUA will remain in effect (meaning this test can be used) for the duration of  the COVID-19 declaration under Section 564(b)(1) of the A ct, 21 U.S.C. section 360bbb-3(b)(1), unless the authorization is terminated or revoked sooner.     Influenza A by PCR NEGATIVE NEGATIVE Final   Influenza B by PCR NEGATIVE NEGATIVE Final    Comment: (NOTE) The Xpert Xpress SARS-CoV-2/FLU/RSV plus assay is intended as an aid in the diagnosis of influenza from Nasopharyngeal swab specimens and should not be used as a sole basis for treatment. Nasal washings and aspirates are unacceptable for Xpert Xpress SARS-CoV-2/FLU/RSV testing.  Fact Sheet for Patients: BloggerCourse.com  Fact Sheet for Healthcare Providers: SeriousBroker.it  This test is not yet approved  or cleared by the [place] FDA and has been authorized for detection and/or diagnosis of SARS-CoV-2 by FDA under an Emergency Use Authorization (EUA). This EUA will remain in effect (meaning this test can be used) for the duration of the COVID-19 declaration under Section 564(b)(1) of the Act, 21 U.S.C. section 360bbb-3(b)(1), unless the authorization is terminated or revoked.     Resp Syncytial Virus by PCR NEGATIVE NEGATIVE Final    Comment: (NOTE) Fact Sheet for Patients: BloggerCourse.com  Fact Sheet for Healthcare Providers: SeriousBroker.it  This test is not yet approved or cleared by the [place] FDA and has been authorized for detection and/or diagnosis of SARS-CoV-2 by FDA under an Emergency Use Authorization (EUA). This EUA will remain in effect (meaning this test can be used) for the duration of the COVID-19 declaration under Section 564(b)(1) of the Act, 21 U.S.C. section 360bbb-3(b)(1), unless the authorization is terminated or revoked.  Performed at [facility], [address] [place], [place] [address]   Gastrointestinal Panel by PCR , Stool     Status: Abnormal   Collection Time: 04/16/23  8:26 PM   Specimen: Stool  Result Value Ref Range Status   Campylobacter species NOT DETECTED NOT DETECTED Final   Plesimonas shigelloides NOT DETECTED NOT DETECTED Final   Salmonella species DETECTED (A) NOT DETECTED Final    Comment: RESULT CALLED TO, READ BACK BY AND VERIFIED WITH: MAGGIE CHRISMON 1407 04/17/23 MU    Yersinia enterocolitica NOT DETECTED NOT DETECTED Final   Vibrio species NOT DETECTED NOT DETECTED Final   Vibrio cholerae NOT DETECTED NOT DETECTED Final   Enteroaggregative E coli (EAEC) NOT DETECTED NOT DETECTED Final   Enteropathogenic E coli (EPEC) NOT DETECTED NOT DETECTED Final   Enterotoxigenic E coli (ETEC) NOT DETECTED NOT DETECTED Final   Shiga like toxin producing  E coli (STEC) NOT DETECTED NOT DETECTED Final   Shigella/Enteroinvasive E coli (EIEC) NOT DETECTED NOT DETECTED Final   Cryptosporidium NOT DETECTED NOT DETECTED Final   Cyclospora cayetanensis  NOT DETECTED NOT DETECTED Final   Entamoeba histolytica NOT DETECTED NOT DETECTED Final   Giardia lamblia NOT DETECTED NOT DETECTED Final   Adenovirus F40/41 NOT DETECTED NOT DETECTED Final   Astrovirus NOT DETECTED NOT DETECTED Final   Norovirus GI/GII NOT DETECTED NOT DETECTED Final   Rotavirus A NOT DETECTED NOT DETECTED Final   Sapovirus (I, II, IV, and V) NOT DETECTED NOT DETECTED Final    Comment: Performed at [facility], [address] [place], [place] [address]  C Difficile Quick Screen w PCR reflex     Status: None   Collection Time: 04/16/23  8:26 PM   Specimen: Stool  Result Value Ref Range Status   C Diff antigen NEGATIVE NEGATIVE Final   C Diff toxin NEGATIVE NEGATIVE Final   C Diff interpretation No C. difficile detected.  Final    Comment: Performed at [facility], [address], [place], [place] [address]    ABX started Rocephin/ azithromyacin  ____________________________________________________ Venous  Blood Gas result:  pH  7.419 Sodium 145 mmol/L   pCO2, Ven 40.0 Low  mmHg Potassium 6.2 High  mmol/L  pO2, Ven 36 mmHg        __________________________________________________________ Recent Labs  Lab 09/20/23 1733 09/20/23 1931  NA 143 145  K 5.3* 6.2*  CO2 24  --   GLUCOSE 136*  --   BUN 66*  --   CREATININE 1.26*  --   CALCIUM 9.5  --     Cr   stable,   Lab Results  Component Value Date   CREATININE 1.26 (H) 09/20/2023   CREATININE 1.24 05/05/2023   CREATININE 1.12 04/23/2023    Recent Labs  Lab 09/20/23 1733  AST 30  ALT 21  ALKPHOS 62  BILITOT 0.4  PROT 7.7  ALBUMIN 2.3*   Lab Results  Component Value Date   CALCIUM 9.5 09/20/2023   PHOS 2.4 (L) 04/08/2022        Plt: Lab Results  Component Value Date   PLT 318  09/20/2023       Recent Labs  Lab 09/20/23 1733 09/20/23 1931  WBC 6.5  --   NEUTROABS 5.0  --   HGB 9.3* 8.5*  HCT 31.8* 25.0*  MCV 89.1  --   PLT 318  --     HG/HCT    Down   from baseline see below    Component Value Date/Time   HGB 8.5 (L) 09/20/2023 1931   HGB 9.4 (L) 05/05/2023 1102   HCT 25.0 (L) 09/20/2023 1931   HCT 29.0 (L) 05/05/2023 1102   MCV 89.1 09/20/2023 1733   MCV 92 05/05/2023 1102      Recent Labs  Lab 09/20/23 1733  LIPASE 52*     _______________________________________________ Hospitalist was called for admission for   Dehydration   Multifocal pneumonia  Hypothyroidism, unspecified type     The following Work up has been ordered so far:  Orders Placed This Encounter  Procedures   Culture, blood (Routine X 2) w Reflex to ID Panel   Resp panel by RT-PCR (RSV, Flu A&B, Covid) Anterior Nasal Swab   DG Chest Port 1 View   CT Head Wo Contrast   CT Cervical Spine Wo Contrast   CT Maxillofacial WO CM   CT CHEST ABDOMEN PELVIS W CONTRAST   Comprehensive metabolic panel   Lipase, blood   CBC with Differential/Platelet   Urinalysis, Routine w reflex microscopic -Urine, Clean Catch  TSH   Vital signs   Consult to hospitalist   Airborne and Contact precautions   I-Stat Lactic Acid   I-Stat venous blood gas, ([facility], [facility], [facility])   ED EKG   EKG 12-Lead   Saline lock IV     OTHER Significant initial  Findings:  labs showing:     DM  labs:  HbA1C: Recent Labs    04/17/23 1831  HGBA1C 5.4       CBG (last 3)  No results for input(s): "GLUCAP" in the last 72 hours.        Cultures:    Component Value Date/Time   SDES  04/16/2023 1000    BLOOD RIGHT ARM Performed at [facility], [address], [place], [place] [address]    SPECREQUEST  04/16/2023 1000    BOTTLES DRAWN AEROBIC ONLY Blood Culture adequate volume Performed at [facility], [address], [place], [place] [address]    CULT   04/16/2023 1000    NO GROWTH 5 DAYS Performed at [facility], [address], [place], [place] [address]    REPTSTATUS 04/21/2023 FINAL 04/16/2023 1000     Radiological Exams on Admission: CT Head Wo Contrast Result Date: 09/20/2023 CLINICAL DATA:  Altered mental status EXAM: CT HEAD WITHOUT CONTRAST TECHNIQUE: Contiguous axial images were obtained from the base of the skull through the vertex without intravenous contrast. RADIATION DOSE REDUCTION: This exam was performed according to the departmental dose-optimization program which includes automated exposure control, adjustment of the mA and/or kV according to patient size and/or use of iterative reconstruction technique. COMPARISON:  04/16/2019 FINDINGS: Brain: No evidence of acute infarction, hemorrhage, hydrocephalus, extra-axial collection or mass lesion/mass effect. Cavum septum pellucidum is again seen, normal variant. Scattered atrophic and chronic white matter ischemic changes are noted. Vascular: No hyperdense vessel or unexpected calcification. Skull: Normal. Negative for fracture or focal lesion. Sinuses/Orbits: Mild mucosal changes are noted in the sphenoid and ethmoid sinuses. Other: None IMPRESSION: Chronic atrophic and ischemic changes are noted. Electronically Signed   By: Alcide Clever M.D.   On: 09/20/2023 22:25   CT Cervical Spine Wo Contrast Result Date: 09/20/2023 CLINICAL DATA:  Neck trauma (Age >= 65y); Facial trauma, blunt EXAM: CT MAXILLOFACIAL WITHOUT CONTRAST CT CERVICAL SPINE WITHOUT CONTRAST TECHNIQUE: Multidetector CT imaging of the maxillofacial structures was performed. Multiplanar CT image reconstructions were also generated. A small metallic BB was placed on the right temple in order to reliably differentiate right from left. Multidetector CT imaging of the cervical spine was performed without intravenous contrast. Multiplanar CT image reconstructions were also generated. RADIATION DOSE REDUCTION: This exam was  performed according to the departmental dose-optimization program which includes automated exposure control, adjustment of the mA and/or kV according to patient size and/or use of iterative reconstruction technique. COMPARISON:  CT head 09/20/2023, CT head 04/01/2022, CT head 01/02/2017. FINDINGS: CT MAXILLOFACIAL FINDINGS Osseous: No fracture or mandibular dislocation. No destructive process. 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 Orbits: Negative. No traumatic or inflammatory finding. Sinuses: Bilateral sphenoid, left ethmoid, left maxillary, left frontal mucosal thickening. Paranasal sinuses otherwise clear. Mastoid air cells are clear. Some interval sclerosis of the left mastoid temporal bone. Soft tissues: Negative. Limited intracranial: Please see separately dictated CT head 09/20/2023. CT CERVICAL FINDINGS Alignment: Normal. Skull base and vertebrae: Multilevel severe degenerative changes of the spine. No associated severe osseous neural foraminal or central canal stenosis. No acute fracture. No aggressive  appearing focal osseous lesion or focal pathologic process. Soft tissues and spinal canal: No prevertebral fluid or swelling. No visible canal hematoma. Upper chest: Unremarkable. Other: Atherosclerotic plaque of the carotid arteries within the neck. IMPRESSION: 1. No acute displaced facial fracture. 2. No acute displaced fracture or traumatic listhesis of the cervical spine. Electronically Signed   By: Tish Frederickson M.D.   On: 09/20/2023 21:47   CT Maxillofacial WO CM Result Date: 09/20/2023 CLINICAL DATA:  Neck trauma (Age >= 65y); Facial trauma, blunt EXAM: CT MAXILLOFACIAL WITHOUT CONTRAST CT CERVICAL SPINE WITHOUT CONTRAST TECHNIQUE: Multidetector CT imaging of the maxillofacial structures was performed. Multiplanar CT image reconstructions were also generated. A small metallic BB was placed on the right temple in order to reliably differentiate right  from left. Multidetector CT imaging of the cervical spine was performed without intravenous contrast. Multiplanar CT image reconstructions were also generated. RADIATION DOSE REDUCTION: This exam was performed according to the departmental dose-optimization program which includes automated exposure control, adjustment of the mA and/or kV according to patient size and/or use of iterative reconstruction technique. COMPARISON:  CT head 09/20/2023, CT head 04/01/2022, CT head 01/02/2017. FINDINGS: CT MAXILLOFACIAL FINDINGS Osseous: No fracture or mandibular dislocation. No destructive process. 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 Orbits: Negative. No traumatic or inflammatory finding. Sinuses: Bilateral sphenoid, left ethmoid, left maxillary, left frontal mucosal thickening. Paranasal sinuses otherwise clear. Mastoid air cells are clear. Some interval sclerosis of the left mastoid temporal bone. Soft tissues: Negative. Limited intracranial: Please see separately dictated CT head 09/20/2023. CT CERVICAL FINDINGS Alignment: Normal. Skull base and vertebrae: Multilevel severe degenerative changes of the spine. No associated severe osseous neural foraminal or central canal stenosis. No acute fracture. No aggressive appearing focal osseous lesion or focal pathologic process. Soft tissues and spinal canal: No prevertebral fluid or swelling. No visible canal hematoma. Upper chest: Unremarkable. Other: Atherosclerotic plaque of the carotid arteries within the neck. IMPRESSION: 1. No acute displaced facial fracture. 2. No acute displaced fracture or traumatic listhesis of the cervical spine. Electronically Signed   By: Tish Frederickson M.D.   On: 09/20/2023 21:47   CT CHEST ABDOMEN PELVIS W CONTRAST Result Date: 09/20/2023 CLINICAL DATA:  Sepsis EXAM: CT CHEST, ABDOMEN, AND PELVIS WITH CONTRAST TECHNIQUE: Multidetector CT imaging of the chest, abdomen and pelvis was performed  following the standard protocol during bolus administration of intravenous contrast. RADIATION DOSE REDUCTION: This exam was performed according to the departmental dose-optimization program which includes automated exposure control, adjustment of the mA and/or kV according to patient size and/or use of iterative reconstruction technique. CONTRAST:  75mL OMNIPAQUE IOHEXOL 350 MG/ML SOLN COMPARISON:  CT abdomen pelvis 04/16/2023, CT angiography chest 12/10/2021 FINDINGS: CHEST: Cardiovascular: No aortic injury. The thoracic aorta is normal in caliber. The heart is normal in size. No significant pericardial effusion. No central or segmental pulmonary embolus. Limited evaluation more distally due to timing of contrast. The main pulmonary artery measures at the upper limits of normal. At least moderate atherosclerotic plaque of the aorta. Four-vessel coronary calcification. Mediastinum/Nodes: No pneumomediastinum. No mediastinal hematoma. The esophagus is unremarkable. The thyroid is unremarkable. The central airways are patent. No mediastinal, hilar, or axillary lymphadenopathy. Lungs/Pleura: Emphysematous changes. Patchy left upper lobe and left lower lobe airspace opacities. Similar finding within the right lower lobe. Stable several scattered pulmonary micronodules-no further follow-up indicated. Debris within the left lower lobe bronchials (25:41). No pulmonary mass. No pulmonary contusion or laceration. No pneumatocele formation.  No pleural effusion. No pneumothorax. No hemothorax. Musculoskeletal/Chest wall: No chest wall mass. No acute rib or sternal fracture.Diffusely decreased bone density. Multilevel severe degenerative changes of the spine. Chronic multilevel mild thoracic vertebral body height loss. Severe degenerative changes of the shoulders. Old nonunionized fracture of the left humeral neck. Old chronic left glenohumeral joint subluxation. ABDOMEN / PELVIS: Hepatobiliary: Not enlarged. No focal lesion.  No laceration or subcapsular hematoma. The gallbladder is otherwise unremarkable with no radio-opaque gallstones. No biliary ductal dilatation. Pancreas: Normal pancreatic contour. No main pancreatic duct dilatation. Spleen: Not enlarged. No focal lesion. No laceration, subcapsular hematoma, or vascular injury. Adrenals/Urinary Tract: No nodularity bilaterally. Bilateral kidneys enhance symmetrically. No hydronephrosis. No contusion, laceration, or subcapsular hematoma. No injury to the vascular structures or collecting systems. No hydroureter. The urinary bladder is unremarkable. Stomach/Bowel: Gastrostomy tube with tip and inflated balloon terminating within the gastric lumen. No small or large bowel wall thickening or dilatation. Increased stool burden throughout the colon. A 7 cm rectal stool ball. The appendix is unremarkable. Vasculature/Lymphatics: No abdominal aorta or iliac aneurysm. No active contrast extravasation or pseudoaneurysm. No abdominal, pelvic, inguinal lymphadenopathy. Reproductive: Prostate enlarged measuring up to 5.4 cm. Question retractile bilateral testis noted along the inguinal canal. Other: No simple free fluid ascites. No pneumoperitoneum. No hemoperitoneum. No mesenteric hematoma identified. No organized fluid collection. Musculoskeletal: No significant soft tissue hematoma. No acute pelvic fracture. No spinal fracture. Diffusely decreased bone density. Multilevel severe degenerative changes of the spine. L4-L5 posterolateral interbody surgical hardware fusion. No CT evidence of surgical hardware complication. Other ports and devices: None. IMPRESSION: 1. Multifocal pneumonia with debris noted within the left lower bronchials. Recommend follow-up CT in 3 months to evaluate for resolution exclude underlying masses. 2. No central or segmental pulmonary embolus. Limited evaluation more distally due to timing of contrast. 3. Constipation with 7 cm rectal stool ball. 4. Gastrostomy tube in  good position. 5. Aortic Atherosclerosis (ICD10-I70.0) including four-vessel coronary calcification. 6.  Emphysema (ICD10-J43.9). 7. Question retractile bilateral testis noted along the inguinal canal. Recommend correlation with physical exam. Electronically Signed   By: Tish Frederickson M.D.   On: 09/20/2023 21:34   DG Chest Port 1 View Result Date: 09/20/2023 CLINICAL DATA:  Syncope and subsequent fall. EXAM: PORTABLE CHEST 1 VIEW COMPARISON:  April 16, 2023 FINDINGS: The heart size and mediastinal contours are within normal limits. The lungs are hyperinflated. Mild areas of scarring, atelectasis and/or infiltrate are seen within the mid left lung and right lung base. No pleural effusion or pneumothorax is identified. A chronic left humeral neck fracture is noted. Multilevel degenerative changes are seen throughout the thoracic spine. IMPRESSION: Mild mid left lung and right basilar scarring, atelectasis and/or infiltrate. Electronically Signed   By: Aram Candela M.D.   On: 09/20/2023 19:04   _______________________________________________________________________________________________________ Latest  Blood pressure (!) 159/89, pulse 86, temperature 98.1 F (36.7 C), temperature source Tympanic, resp. rate (!) 22, SpO2 100%.   Vitals  labs and radiology finding personally reviewed  Review of Systems:    Pertinent positives include:   fatigue, weight loss   Constitutional:  No weight loss, night sweats, Fevers, chills, HEENT:  No headaches, Difficulty swallowing,Tooth/dental problems,Sore throat,  No sneezing, itching, ear ache, nasal congestion, post nasal drip,  Cardio-vascular:  No chest pain, Orthopnea, PND, anasarca, dizziness, palpitations.no Bilateral lower extremity swelling  GI:  No heartburn, indigestion, abdominal pain, nausea, vomiting, diarrhea, change in bowel habits, loss of appetite, melena, blood in stool, hematemesis Resp:  no shortness of breath at rest. No dyspnea  on exertion, No excess mucus, no productive cough, No non-productive cough, No coughing up of blood.No change in color of mucus.No wheezing. Skin:  no rash or lesions. No jaundice GU:  no dysuria, change in color of urine, no urgency or frequency. No straining to urinate.  No flank pain.  Musculoskeletal:  No joint pain or no joint swelling. No decreased range of motion. No back pain.  Psych:  No change in mood or affect. No depression or anxiety. No memory loss.  Neuro: no localizing neurological complaints, no tingling, no weakness, no double vision, no gait abnormality, no slurred speech, no confusion  All systems reviewed and apart from HOPI all are negative _______________________________________________________________________________________________ Past Medical History:   Past Medical History:  Diagnosis Date   Back pain 10/2008   per MRI 11/06/2008 - severe spinal stenosis worse at L4-5, L3-4, with neural compression at those levels   BPH (benign prostatic hyperplasia)    Eczema    Gout    Hypertension    Hypothyroidism       Past Surgical History:  Procedure Laterality Date   ESOPHAGOGASTRODUODENOSCOPY N/A 04/03/2022   Procedure: ESOPHAGOGASTRODUODENOSCOPY (EGD);  Surgeon: Jeani Hawking, MD;  Location: [facility] ENDOSCOPY;  Service: Gastroenterology;  Laterality: N/A;   LAMINECTOMY  12/11/2008    Bilateral L2, L3, and L4 laminotomies, done by Dr. Lovell Sheehan   LAPAROSCOPY N/A 04/06/2022   Procedure: LAPAROSCOPIC ASSISTED PERCUTANEOUS ENDOSCOPIC GASTROSTOMY PLACEMENT;  Surgeon: Manus Rudd, MD;  Location: [facility] OR;  Service: General;  Laterality: N/A;    Social History:  Ambulatory *** independently cane, walker  wheelchair bound, bed bound     reports that he has never smoked. He has never used smokeless tobacco. He reports that he does not drink alcohol and does not use drugs.     Family History:   Family History  Problem Relation Age of Onset   Stroke Neg Hx     Cancer Neg Hx    ______________________________________________________________________________________________ Allergies: No Known Allergies   Prior to Admission medications   Medication Sig Start Date End Date Taking? Authorizing Provider  Iron, Ferrous Sulfate, 325 (65 Fe) MG TABS Take 1 tablet (325 mg) by mouth every other day. Please make an appointment with Dr. Laural Benes for more refills. Patient taking differently: Place 325 mg into feeding tube every other day. 04/10/23  Yes Marcine Matar, MD  levothyroxine (SYNTHROID) 75 MCG tablet Take 1 tablet (75 mcg total) by mouth daily before breakfast. 04/12/23  Yes Marcine Matar, MD  allopurinol (ZYLOPRIM) 100 MG tablet Take 1 tablet (100 mg total) by mouth daily. Patient not taking: Reported on 09/20/2023 06/16/23   Marcine Matar, MD  metoprolol tartrate (LOPRESSOR) 25 MG tablet Place 0.5 tablets (12.5 mg total) into feeding tube 2 (two) times daily. STOP BISOPROLOL Patient not taking: Reported on 09/20/2023 05/05/23   Marcine Matar, MD  saccharomyces boulardii (FLORASTOR) 250 MG capsule Place 1 capsule (250 mg total) into feeding tube 2 (two) times daily. Patient not taking: Reported on 09/20/2023 04/23/23   Leatha Gilding, MD  Water For Irrigation, Sterile (FREE WATER) SOLN 50 mL free water flush via PEG tube before and after each bolus 04/08/22   Maretta Bees, MD    ___________________________________________________________________________________________________ Physical Exam:    09/20/2023    9:20 PM 09/20/2023    6:40 PM 09/20/2023    5:00 PM  Vitals with BMI  Systolic 159 155 098  Diastolic 89 98 73  Pulse 86 90 100     1. General:  in No ***Acute distress***increased work of breathing ***complaining of severe pain****agitated * Chronically ill *well *cachectic *toxic acutely ill -appearing 2. Psychological: Alert and *** Oriented 3. Head/ENT:   Moist *** Dry Mucous Membranes                           Head Non traumatic, neck supple                          Normal *** Poor Dentition 4. SKIN: normal *** decreased Skin turgor,  Skin clean Dry and intact no rash    5. Heart: Regular rate and rhythm no*** Murmur, no Rub or gallop 6. Lungs: ***Clear to auscultation bilaterally, no wheezes or crackles   7. Abdomen: Soft, ***non-tender, Non distended *** obese ***bowel sounds present 8. Lower extremities: no clubbing, cyanosis, no ***edema 9. Neurologically Grossly intact, moving all 4 extremities equally *** strength 5 out of 5 in all 4 extremities cranial nerves II through XII intact 10. MSK: Normal range of motion    Chart has been reviewed  ______________________________________________________________________________________________  Assessment/Plan  ***  Admitted for *** Dehydration ***  Failure to thrive (child) ***  Multifocal pneumonia ***  Hypothyroidism, unspecified type ***    Present on Admission: **None**     No problem-specific Assessment & Plan notes found for this encounter.    Other plan as per orders.  DVT prophylaxis:  SCD *** Lovenox       Code Status:    Code Status: Prior FULL CODE *** DNR/DNI ***comfort care as per patient ***family  I had personally discussed CODE STATUS with patient and family*  ACP *** none has been reviewed ***   Family Communication:   Family not at  Bedside  plan of care was discussed on the phone with *** Son, Daughter, Wife, Husband, Sister, Brother , father, mother  Diet    Disposition Plan:   *** likely will need placement for rehabilitation                          Back to current facility when stable                            To home once workup is complete and patient is stable  ***Following barriers for discharge:                             Chest pain *** Stroke *** work up is complete                            Electrolytes corrected                               Anemia corrected h/H stable                              Pain controlled with PO medications  Afebrile, white count improving able to transition to PO antibiotics                             Will need to be able to tolerate PO                            Will likely need home health, home O2, set up                           Will need consultants to evaluate patient prior to discharge       Consult Orders  (From admission, onward)           Start     Ordered   09/20/23 2227  Consult to hospitalist  PG SENT BY DELORIS  Once       Provider:  (Not yet assigned)  Question Answer Comment  Place call to: [facility] [id number] failure to thrive, dehydration, multifocal pneumonia   Reason for Consult Admit      09/20/23 2226                              ***Would benefit from PT/OT eval prior to DC  Ordered                   Swallow eval - SLP ordered                   Diabetes care coordinator                   Transition of care consulted                   Nutrition    consulted                  Wound care  consulted                   Palliative care    consulted                   Behavioral health  consulted                    Consults called: ***     Admission status:  ED Disposition     ED Disposition  Admit   Condition  --   Comment  The patient appears reasonably stabilized for admission considering the current resources, flow, and capabilities available in the ED at this time, and I doubt any other [facility] requiring further screening and/or treatment in the ED prior to admission is  present.           Obs***  ***  inpatient     I Expect 2 midnight stay secondary to severity of patient's current illness need for inpatient interventions justified by the following: ***hemodynamic instability despite optimal treatment (tachycardia *hypotension * tachypnea *hypoxia, hypercapnia) * Severe lab/radiological/exam abnormalities including:     and extensive comorbidities  including: *substance abuse  *Chronic pain *DM2  * CHF * CAD  * COPD/asthma *Morbid Obesity * CKD *dementia *liver disease *history of stroke with residual deficits *  malignancy, * sickle cell disease  History of amputation Chronic anticoagulation  That are currently affecting medical management.   I expect  patient to be hospitalized for 2 midnights requiring inpatient medical care.  Patient is at high risk for adverse outcome (such as loss of life or disability) if not treated.  Indication for inpatient stay as follows:  Severe change from baseline regarding mental status Hemodynamic instability despite maximal medical therapy,  ongoing suicidal ideations,  severe pain requiring acute inpatient management,  inability to maintain oral hydration   persistent chest pain despite medical management Need for operative/procedural  intervention New or worsening hypoxia   Need for IV antibiotics, IV fluids, IV rate controling medications, IV antihypertensives, IV pain medications, IV anticoagulation, need for biPAP    Level of care   *** tele  For 12H 24H     medical floor       progressive     stepdown   tele indefinitely please discontinue once patient no longer qualifies COVID-19 Labs      Caro Brundidge 09/20/2023, 10:48 PM ***  [facility]     after 2 AM please page floor coverage PA If 7AM-7PM, please contact the day team taking care of the patient using [email or web address]

## 2023-09-20 NOTE — Assessment & Plan Note (Signed)
 Given soft blood pressure allow permissive hypertension for tonight

## 2023-09-20 NOTE — Assessment & Plan Note (Addendum)
 Status post PEG tube placement completely dependent on tube feeds Family states they are no longer able to afford those for the patient Would benefit from social work/ transitional care consult Family not interested in placement at that time

## 2023-09-20 NOTE — ED Triage Notes (Signed)
"  From home, called out for syncope and he hit his head. Family states he has been having these episodes frequently the past 3 weeks and has not been seen or had any treatment for the episodes. He meets our sepsis criteria. 103HR, 90% on RA, Resp 26, CBG 145. Dementia at baseline. Patient speaks Arabic. Son will be coming and he translates" per EMS

## 2023-09-20 NOTE — Assessment & Plan Note (Signed)
 With significant debility.  PT OT evaluation

## 2023-09-20 NOTE — Telephone Encounter (Addendum)
 Per [name] [name], Director of [facility],  he reviewed the medical record and is not able to offer assistance at this time.  I spoke to [facility] and he said that they would supply another month of the feeding if we would provide an LOG (letter of guarantee) .  I discussed the case with Judge Stall, and she did not have any other recommendations for assistance for the patient at this time other than to request assistance from the [facility].  However, that assistance would be very limited and the patient's family would need to have a plan to obtain the feedings going forward. She was not familiar with the process for securing an LOG.   I also spoke to patient's son, [name] [name] and informed him that Dr Laural Benes has the documents from [facility], that he dropped off yesterday. I also explained to him that as of today I do not have any financial assistance available to help him immediately with his father's feedings.   I explained to him that it would be best to have his father evaluated in the ED.  He then said his father fell since we last spoke and he plans to take his father to the ED.  He assured me that he would take him today.

## 2023-09-21 ENCOUNTER — Observation Stay (HOSPITAL_COMMUNITY): Payer: MEDICAID

## 2023-09-21 DIAGNOSIS — I700 Atherosclerosis of aorta: Secondary | ICD-10-CM | POA: Diagnosis present

## 2023-09-21 DIAGNOSIS — R627 Adult failure to thrive: Secondary | ICD-10-CM | POA: Diagnosis present

## 2023-09-21 DIAGNOSIS — F0390 Unspecified dementia without behavioral disturbance: Secondary | ICD-10-CM | POA: Diagnosis present

## 2023-09-21 DIAGNOSIS — E875 Hyperkalemia: Secondary | ICD-10-CM | POA: Diagnosis present

## 2023-09-21 DIAGNOSIS — E870 Hyperosmolality and hypernatremia: Secondary | ICD-10-CM | POA: Diagnosis present

## 2023-09-21 DIAGNOSIS — D631 Anemia in chronic kidney disease: Secondary | ICD-10-CM | POA: Diagnosis present

## 2023-09-21 DIAGNOSIS — Z1152 Encounter for screening for COVID-19: Secondary | ICD-10-CM | POA: Diagnosis not present

## 2023-09-21 DIAGNOSIS — I5A Non-ischemic myocardial injury (non-traumatic): Secondary | ICD-10-CM | POA: Diagnosis present

## 2023-09-21 DIAGNOSIS — W06XXXA Fall from bed, initial encounter: Secondary | ICD-10-CM | POA: Diagnosis present

## 2023-09-21 DIAGNOSIS — E43 Unspecified severe protein-calorie malnutrition: Secondary | ICD-10-CM | POA: Diagnosis present

## 2023-09-21 DIAGNOSIS — Z79890 Hormone replacement therapy (postmenopausal): Secondary | ICD-10-CM | POA: Diagnosis not present

## 2023-09-21 DIAGNOSIS — J189 Pneumonia, unspecified organism: Secondary | ICD-10-CM | POA: Diagnosis present

## 2023-09-21 DIAGNOSIS — Z681 Body mass index (BMI) 19 or less, adult: Secondary | ICD-10-CM | POA: Diagnosis not present

## 2023-09-21 DIAGNOSIS — R1310 Dysphagia, unspecified: Secondary | ICD-10-CM | POA: Diagnosis present

## 2023-09-21 DIAGNOSIS — E860 Dehydration: Secondary | ICD-10-CM | POA: Diagnosis present

## 2023-09-21 DIAGNOSIS — E039 Hypothyroidism, unspecified: Secondary | ICD-10-CM | POA: Diagnosis present

## 2023-09-21 DIAGNOSIS — L8990 Pressure ulcer of unspecified site, unspecified stage: Secondary | ICD-10-CM | POA: Diagnosis present

## 2023-09-21 DIAGNOSIS — Z431 Encounter for attention to gastrostomy: Secondary | ICD-10-CM | POA: Diagnosis not present

## 2023-09-21 DIAGNOSIS — N138 Other obstructive and reflux uropathy: Secondary | ICD-10-CM | POA: Diagnosis present

## 2023-09-21 DIAGNOSIS — Y92009 Unspecified place in unspecified non-institutional (private) residence as the place of occurrence of the external cause: Secondary | ICD-10-CM | POA: Diagnosis not present

## 2023-09-21 DIAGNOSIS — E538 Deficiency of other specified B group vitamins: Secondary | ICD-10-CM | POA: Diagnosis present

## 2023-09-21 DIAGNOSIS — L89153 Pressure ulcer of sacral region, stage 3: Secondary | ICD-10-CM | POA: Diagnosis present

## 2023-09-21 DIAGNOSIS — D509 Iron deficiency anemia, unspecified: Secondary | ICD-10-CM | POA: Diagnosis present

## 2023-09-21 DIAGNOSIS — N1831 Chronic kidney disease, stage 3a: Secondary | ICD-10-CM | POA: Diagnosis present

## 2023-09-21 DIAGNOSIS — J690 Pneumonitis due to inhalation of food and vomit: Secondary | ICD-10-CM | POA: Diagnosis present

## 2023-09-21 DIAGNOSIS — I129 Hypertensive chronic kidney disease with stage 1 through stage 4 chronic kidney disease, or unspecified chronic kidney disease: Secondary | ICD-10-CM | POA: Diagnosis present

## 2023-09-21 DIAGNOSIS — R64 Cachexia: Secondary | ICD-10-CM | POA: Diagnosis present

## 2023-09-21 LAB — RESP PANEL BY RT-PCR (RSV, FLU A&B, COVID)  RVPGX2
Influenza A by PCR: NEGATIVE
Influenza B by PCR: NEGATIVE
Resp Syncytial Virus by PCR: NEGATIVE
SARS Coronavirus 2 by RT PCR: NEGATIVE

## 2023-09-21 LAB — BASIC METABOLIC PANEL WITH GFR
Anion gap: 9 (ref 5–15)
BUN: 60 mg/dL — ABNORMAL HIGH (ref 8–23)
CO2: 27 mmol/L (ref 22–32)
Calcium: 9.3 mg/dL (ref 8.9–10.3)
Chloride: 111 mmol/L (ref 98–111)
Creatinine, Ser: 1.11 mg/dL (ref 0.61–1.24)
GFR, Estimated: >60 mL/min (ref >60)
Glucose, Bld: 82 mg/dL (ref 70–99)
Potassium: 5.3 mmol/L — ABNORMAL HIGH (ref 3.5–5.1)
Sodium: 147 mmol/L — ABNORMAL HIGH (ref 135–145)

## 2023-09-21 LAB — RESPIRATORY PANEL BY PCR

## 2023-09-21 LAB — COMPREHENSIVE METABOLIC PANEL WITH GFR
ALT: 18 U/L (ref 0–44)
AST: 24 U/L (ref 15–41)
Albumin: 2.2 g/dL — ABNORMAL LOW (ref 3.5–5.0)
Alkaline Phosphatase: 57 U/L (ref 38–126)
Anion gap: 9 (ref 5–15)
BUN: 54 mg/dL — ABNORMAL HIGH (ref 8–23)
CO2: 22 mmol/L (ref 22–32)
Calcium: 9 mg/dL (ref 8.9–10.3)
Chloride: 113 mmol/L — ABNORMAL HIGH (ref 98–111)
Creatinine, Ser: 1.07 mg/dL (ref 0.61–1.24)
GFR, Estimated: >60 mL/min (ref >60)
Glucose, Bld: 96 mg/dL (ref 70–99)
Potassium: 5 mmol/L (ref 3.5–5.1)
Sodium: 144 mmol/L (ref 135–145)
Total Bilirubin: 0.4 mg/dL (ref 0.0–1.2)
Total Protein: 7.4 g/dL (ref 6.5–8.1)

## 2023-09-21 LAB — URINALYSIS, COMPLETE (UACMP) WITH MICROSCOPIC
Bacteria, UA: NONE SEEN
Bilirubin Urine: NEGATIVE
Glucose, UA: NEGATIVE mg/dL
Hgb urine dipstick: NEGATIVE
Ketones, ur: NEGATIVE mg/dL
Nitrite: NEGATIVE
Protein, ur: NEGATIVE mg/dL
Specific Gravity, Urine: 1.025 (ref 1.005–1.030)
pH: 7 (ref 5.0–8.0)

## 2023-09-21 LAB — STREP PNEUMONIAE URINARY ANTIGEN: Strep Pneumo Urinary Antigen: NEGATIVE

## 2023-09-21 LAB — T4, FREE: Free T4: 0.73 ng/dL (ref 0.61–1.12)

## 2023-09-21 LAB — MAGNESIUM
Magnesium: 2.2 mg/dL (ref 1.7–2.4)
Magnesium: 2.1 mg/dL (ref 1.7–2.4)

## 2023-09-21 LAB — PROCALCITONIN: Procalcitonin: <0.1 ng/mL

## 2023-09-21 LAB — RETICULOCYTES
Immature Retic Fract: 8 % (ref 2.3–15.9)
RBC.: 3.37 MIL/uL — ABNORMAL LOW (ref 4.22–5.81)
Retic Count, Absolute: 39.4 10*3/uL (ref 19.0–186.0)
Retic Ct Pct: 1.2 % (ref 0.4–3.1)

## 2023-09-21 LAB — IRON AND TIBC
Iron: 16 ug/dL — ABNORMAL LOW (ref 45–182)
Saturation Ratios: 8 % — ABNORMAL LOW (ref 17.9–39.5)
TIBC: 197 ug/dL — ABNORMAL LOW (ref 250–450)
UIBC: 181 ug/dL

## 2023-09-21 LAB — FERRITIN: Ferritin: 352 ng/mL — ABNORMAL HIGH (ref 24–336)

## 2023-09-21 LAB — TROPONIN I (HIGH SENSITIVITY)
Troponin I (High Sensitivity): 123 ng/L (ref <18)
Troponin I (High Sensitivity): 126 ng/L (ref <18)
Troponin I (High Sensitivity): 121 ng/L (ref <18)

## 2023-09-21 LAB — CK: Total CK: 75 U/L (ref 49–397)

## 2023-09-21 LAB — CBC
HCT: 31.4 % — ABNORMAL LOW (ref 39.0–52.0)
Hemoglobin: 9.2 g/dL — ABNORMAL LOW (ref 13.0–17.0)
MCH: 25.8 pg — ABNORMAL LOW (ref 26.0–34.0)
MCHC: 29.3 g/dL — ABNORMAL LOW (ref 30.0–36.0)
MCV: 88.2 fL (ref 80.0–100.0)
Platelets: 313 10*3/uL (ref 150–400)
RBC: 3.56 MIL/uL — ABNORMAL LOW (ref 4.22–5.81)
RDW: 16.6 % — ABNORMAL HIGH (ref 11.5–15.5)
WBC: 6.8 10*3/uL (ref 4.0–10.5)
nRBC: 0 % (ref 0.0–0.2)

## 2023-09-21 LAB — PHOSPHORUS
Phosphorus: 3.3 mg/dL (ref 2.5–4.6)
Phosphorus: 3.2 mg/dL (ref 2.5–4.6)

## 2023-09-21 LAB — LACTIC ACID, PLASMA
Lactic Acid, Venous: 1.2 mmol/L (ref 0.5–1.9)
Lactic Acid, Venous: 1.2 mmol/L (ref 0.5–1.9)

## 2023-09-21 LAB — AMMONIA: Ammonia: <10 umol/L (ref 9–35)

## 2023-09-21 LAB — FOLATE: Folate: 10.7 ng/mL (ref >5.9)

## 2023-09-21 LAB — VITAMIN B12: Vitamin B-12: 143 pg/mL — ABNORMAL LOW (ref 180–914)

## 2023-09-21 MED ORDER — HYDROCODONE-ACETAMINOPHEN 5-325 MG PO TABS
1.0000 | ORAL_TABLET | ORAL | Status: DC | PRN
  Administered 2023-09-26 – 2023-09-27 (×3): 1
  Administered 2023-09-28: 2
  Filled 2023-09-21: qty 2
  Filled 2023-09-21 (×3): qty 1

## 2023-09-21 MED ORDER — SODIUM CHLORIDE 0.9 % IV SOLN
INTRAVENOUS | Status: AC
Start: 2023-09-21 — End: 2023-09-21

## 2023-09-21 MED ORDER — ACETAMINOPHEN 650 MG RE SUPP: 650.0000 mg | Freq: Four times a day (QID) | RECTAL | Status: DC | PRN

## 2023-09-21 MED ORDER — ONDANSETRON HCL 4 MG PO TABS: 4.0000 mg | ORAL_TABLET | Freq: Four times a day (QID) | ORAL | Status: DC | PRN

## 2023-09-21 MED ORDER — LEVOTHYROXINE SODIUM 100 MCG PO TABS
100.0000 ug | ORAL_TABLET | Freq: Every day | ORAL | Status: DC
  Administered 2023-09-21 – 2023-09-30 (×8): 100 ug
  Filled 2023-09-21 (×9): qty 1

## 2023-09-21 MED ORDER — AMLODIPINE BESYLATE 5 MG PO TABS
5.0000 mg | ORAL_TABLET | Freq: Every day | ORAL | Status: DC
  Administered 2023-09-21 – 2023-09-30 (×9): 5 mg
  Filled 2023-09-21 (×9): qty 1

## 2023-09-21 MED ORDER — ACETAMINOPHEN 325 MG PO TABS: 650.0000 mg | ORAL_TABLET | Freq: Four times a day (QID) | ORAL | Status: DC | PRN

## 2023-09-21 MED ORDER — SMOG ENEMA
960.0000 mL | Freq: Once | RECTAL | Status: AC
  Administered 2023-09-21: 960 mL via RECTAL
  Filled 2023-09-21: qty 960

## 2023-09-21 MED ORDER — SODIUM CHLORIDE 0.9 % IV SOLN
3.0000 g | Freq: Three times a day (TID) | INTRAVENOUS | Status: DC
  Administered 2023-09-21 – 2023-09-22 (×3): 3 g via INTRAVENOUS
  Filled 2023-09-21 (×3): qty 8

## 2023-09-21 MED ORDER — SODIUM CHLORIDE 0.9 % IV SOLN
3.0000 g | Freq: Four times a day (QID) | INTRAVENOUS | Status: DC
  Administered 2023-09-21 (×2): 3 g via INTRAVENOUS
  Filled 2023-09-21 (×2): qty 8

## 2023-09-21 MED ORDER — FREE WATER
200.0000 mL | Freq: Four times a day (QID) | Status: DC
  Administered 2023-09-21 – 2023-09-28 (×29): 200 mL

## 2023-09-21 MED ORDER — BISACODYL 10 MG RE SUPP
10.0000 mg | Freq: Once | RECTAL | Status: AC
  Administered 2023-09-21: 10 mg via RECTAL
  Filled 2023-09-21: qty 1

## 2023-09-21 MED ORDER — ALBUTEROL SULFATE (2.5 MG/3ML) 0.083% IN NEBU: 2.5000 mg | INHALATION_SOLUTION | RESPIRATORY_TRACT | Status: DC | PRN

## 2023-09-21 MED ORDER — FERROUS SULFATE 300 (60 FE) MG/5ML PO SOLN
300.0000 mg | ORAL | Status: DC
  Administered 2023-09-22 – 2023-09-30 (×4): 300 mg
  Filled 2023-09-21 (×5): qty 5

## 2023-09-21 MED ORDER — HYDROCODONE-ACETAMINOPHEN 5-325 MG PO TABS: 1.0000 | ORAL_TABLET | ORAL | Status: DC | PRN

## 2023-09-21 MED ORDER — ONDANSETRON HCL 4 MG/2ML IJ SOLN: 4.0000 mg | Freq: Four times a day (QID) | INTRAMUSCULAR | Status: DC | PRN

## 2023-09-21 NOTE — Assessment & Plan Note (Signed)
 Wound care consult to evaluate

## 2023-09-21 NOTE — Progress Notes (Incomplete)
 Pharmacy Antibiotic Note  [name] [name] is a 89 y.o. male admitted on 09/20/2023 with {Indications:3041527}.  Pharmacy has been consulted for *** dosing.  Plan: {Assessment:21075}     Temp (24hrs), Avg:98.1 F (36.7 C), Min:98 F (36.7 C), Max:98.1 F (36.7 C)  Recent Labs  Lab 09/20/23 1733 09/20/23 1746 09/20/23 1932  WBC 6.5  --   --   CREATININE 1.26*  --   --   LATICACIDVEN  --  2.1* 1.8    CrCl cannot be calculated (Unknown ideal weight.).    No Known Allergies  Antimicrobials this admission: *** *** >> *** *** *** >> ***  Dose adjustments this admission: ***  Microbiology results: *** BCx: *** *** UCx: ***  *** Sputum: ***  *** MRSA PCR: ***  Thank you for allowing pharmacy to be a part of this patient's care.  Fayrene Fearing [facility] 09/21/2023 12:01 AM

## 2023-09-21 NOTE — Evaluation (Signed)
 Physical Therapy Evaluation Patient Details Name: [name] [name] MRN: [id number] DOB: May 13, 1934 Today's Date: 09/21/2023  History of Present Illness  Pt is an 89 y.o. male admitted from home 3/26 with PNA and FTT. PMH: dementia, eczema,  HTN, hypothyroidism, BPH, normocytic anemia, gout, venous insufficiency, dysphagia with PEG placement 03/2022  Clinical Impression  Pt admitted with above diagnosis. PTA pt lived at home with family. As of 03/2023 PT eval, amb in house with cane vs RW. Per chart, pt has been bedbound for approx 3-4 weeks, and total care. Pt currently with functional limitations due to the deficits listed below (see PT Problem List). On eval, pt required total assist rolling R/L. Pt obtunded, little interaction with therapist and continual moaning. No family present to interpret. Attempted to use ipad interpreter but pt not responding. Pt in fetal position in L sidelying on stretcher. Able to attain functional ROM BUE/LE with gentle PROM, but not able to get to full extension. Pt will benefit from acute skilled PT to increase their independence and safety with mobility to allow discharge. PT to follow acutely to further assess mobility. No follow up services indicated. If pt returns home with family, recommend below noted DME.         If plan is discharge home, recommend the following: Two people to help with walking and/or transfers;Two people to help with bathing/dressing/bathroom;Help with stairs or ramp for entrance;Assist for transportation;Direct supervision/assist for medications management;Assistance with feeding   Can travel by private vehicle   No    Equipment Recommendations Wheelchair (measurements PT);Wheelchair cushion (measurements PT);Hospital bed;Hoyer lift  Recommendations for Other Services       Functional Status Assessment Patient has had a recent decline in their functional status and/or demonstrates limited ability to make significant improvements in function  in a reasonable and predictable amount of time     Precautions / Restrictions Precautions Precautions: Fall;Other (comment) Precaution/Restrictions Comments: PEG, decub ulcer L hip      Mobility  Bed Mobility Overal bed mobility: Needs Assistance Bed Mobility: Rolling Rolling: Total assist         General bed mobility comments: will need +2 to attempt supine<>sit    Transfers                   General transfer comment: deferred    Ambulation/Gait               General Gait Details: deferred  Stairs            Wheelchair Mobility     Tilt Bed    Modified Rankin (Stroke Patients Only)       Balance                                             Pertinent Vitals/Pain Pain Assessment Pain Assessment: PAINAD Breathing: normal Negative Vocalization: repeated troubled calling out, loud moaning/groaning, crying Facial Expression: sad, frightened, frown Body Language: tense, distressed pacing, fidgeting Consolability: distracted or reassured by voice/touch PAINAD Score: 5 Pain Intervention(s): Monitored during session    Home Living Family/patient expects to be discharged to:: Private residence Living Arrangements: Children;Other relatives (son, DIL, grandchildren) Available Help at Discharge: Family;Available 24 hours/day Type of Home: House Home Access: Stairs to enter Entrance Stairs-Rails: Doctor, general practice of Steps: 3   Home Layout: One level Home Equipment: Agricultural consultant (2 wheels);Cane - single  point Additional Comments: No family available. Home set up taken from chart (03/2023 admission).    Prior Function Prior Level of Function : Patient poor historian/Family not available;Needs assist             Mobility Comments: As of 03/2023 PT eval, pt amb with cane. Per chart, pt now bedbound. ADLs Comments: As of 03/2023 PT eval, pt mod I toileting and dressing. Family assisted with showering.  Per chart now, pt total assist and incontinent.     Extremity/Trunk Assessment   Upper Extremity Assessment Upper Extremity Assessment: Generalized weakness    Lower Extremity Assessment Lower Extremity Assessment: Generalized weakness    Cervical / Trunk Assessment Cervical / Trunk Assessment: Kyphotic  Communication   Communication Communication: Impaired Factors Affecting Communication: Non - English speaking, interpreter not available;Reduced clarity of speech    Cognition Arousal: Obtunded Behavior During Therapy: Flat affect   PT - Cognitive impairments: Difficult to assess Difficult to assess due to: Non-English speaking                     PT - Cognition Comments: No family present. Attempted use of ipad interpreter, Tobi Bastos, but pt not interacting/responding. Following commands: Impaired       Cueing       General Comments General comments (skin integrity, edema, etc.): Edema noted bilat distal LE. Decub ulcer L hip. Pt continually moaning. In fetal position on stretcher, L sidelying. VSS on RA    Exercises     Assessment/Plan    PT Assessment Patient needs continued PT services  PT Problem List Decreased strength;Decreased balance;Decreased cognition;Pain;Decreased mobility;Decreased range of motion;Decreased activity tolerance;Decreased safety awareness;Decreased skin integrity       PT Treatment Interventions Functional mobility training;Patient/family education;Therapeutic activities;Therapeutic exercise    PT Goals (Current goals can be found in the Care Plan section)  Acute Rehab PT Goals Patient Stated Goal: unable to state PT Goal Formulation: Patient unable to participate in goal setting Time For Goal Achievement: 10/05/23 Potential to Achieve Goals: Poor    Frequency Min 1X/week     Co-evaluation               AM-PAC PT "6 Clicks" Mobility  Outcome Measure Help needed turning from your back to your side while in a flat bed  without using bedrails?: Total Help needed moving from lying on your back to sitting on the side of a flat bed without using bedrails?: Total Help needed moving to and from a bed to a chair (including a wheelchair)?: Total Help needed standing up from a chair using your arms (e.g., wheelchair or bedside chair)?: Total Help needed to walk in hospital room?: Total Help needed climbing 3-5 steps with a railing? : Total 6 Click Score: 6    End of Session   Activity Tolerance: Patient limited by fatigue;Patient limited by lethargy Patient left: in bed;with call bell/phone within reach Nurse Communication: Mobility status PT Visit Diagnosis: Other abnormalities of gait and mobility (R26.89);Muscle weakness (generalized) (M62.81)    Time: 8295-6213 PT Time Calculation (min) (ACUTE ONLY): 19 min   Charges:   PT Evaluation $PT Eval Moderate Complexity: 1 Mod   PT General Charges $$ ACUTE PT VISIT: 1 Visit         Ferd Glassing., PT  Office # [phone]   Ilda Foil 09/21/2023, 10:35 AM

## 2023-09-21 NOTE — Consult Note (Signed)
 WOC Nurse Consult Note: Reason for Consult: Requested to assess decubitus ulcer. Wound type: PI stage 3 on hip (trochanter area) Pressure Injury POA: Yes Measurement: 3 wounds with 1 cm x 0.5 cm each. Wound bed: 100% yellow Drainage (amount, consistency, odor) scant amount, no odor. Periwound: Intact. Assesses all the critical areas for a PI, the pt does not have any other injury. The pt is emaciated, restricted to bed. Very high risk to development pressure injuries. Dressing procedure/placement/frequency: Apply Xeroform on the wound bed, change daily. Cover with foam dressing, change every 3 days or PRN. Apply foam dressing on knees, sacrum and heels to prevent further injuries, and on bony areas. Ordered air loss mattress.  WOC team will not plan to follow further.  Please reconsult if further assistance is needed. Thank-you,  Denyse Amass BSN, RN, [employer], WOC  (Pager: [phone])

## 2023-09-21 NOTE — ED Notes (Signed)
 Droplet precautions sign placed on pt door.

## 2023-09-21 NOTE — ED Notes (Signed)
 Admit

## 2023-09-21 NOTE — Consult Note (Signed)
 WOC team consulted for "decubitus ulcer".    Please note that the [facility] nursing team is utilizing a standardized work plan to manage patient consults. We are triaging consults and will try to see the patients within 48 hours. Wound photos in the patient's chart allow [place] to consult on the patient in the most efficient and timely manner.    Thank you,    Priscella Mann MSN, RN-BC, [employer] [phone]

## 2023-09-21 NOTE — Progress Notes (Signed)
 PROGRESS NOTE    [name] [name]  HYQ:[id number] DOB: 1933/08/06 DOA: 09/20/2023 PCP: Marcine Matar, MD   Brief Narrative: [name] [name] is a 89 y.o. male with a history of dementia, hypertension, hypothyroidism, BPH, anemia, gout, venous insufficiency, dysphagia status post PEG tube placement.  Patient presented secondary to loss of consciousness and failure to thrive.  Syncope workup initiated.  Patient was found to have evidence of multifocal pneumonia started on empiric antibiotics.   Assessment/Plan:  Syncope Unclear etiology. Troponin elevated with negative delta. No chest pain. EKG non-acute. Transthoracic Echocardiogram ordered and pending. CT head imaging non-acute.  Multifocal pneumonia Possible aspiration etiology. Patient initially given Ceftriaxone/Azithromycin and transitioned to Unasyn and azithromycin. No leukocytosis. Procalcitonin is undetectable. COVID-19, RSV and influenza negative. Recommendation for repeat CT scan in 3 months to evaluate for resolution and exclude underlying masses. -Continue Unasyn IV and azithromycin -Flutter valve/incentive spirometer -Check respiratory virus panel -Follow-up blood cultures  Failure to thrive -Palliative care consult -PT/OT  Primary hypertension Patient is listed as taking metoprolol tartrate, but however is not taking as an outpatient.  Blood pressure is uncontrolled. -Start amlodipine 5 mg daily.  BPH Noted. Not on medication therapy as an outpatient.  PEG tube dependence Dysphagia Noted.  Patient is on T-piece as outpatient.  Tube feeds held currently. Tube confirmed in good position on CT imaging.  Speech therapy consulted. -Dietitian for tube feeds -SLP for ongoing swallow evaluation  Hypothyroidism -Continue levothyroxine 75 mcg daily per tube  Severe malnutrition Patient is currently PEG tube dependent.  Dietitian consulted on admission.  Constipation Rectal stool ball noted on CT imaging measuring 7  cm. -Dulcolax suppository; if fails, SMOG enema  Dementia Noted. -Delirium precautions  Pressure injury Noted on admission.  Wound care consulted.  Hyperkalemia Potassium of 5.3 on admission with peak of 6.2.  Resolved with fluid.  Hypernatremia Likely related to dehydration from no oral intake and poor intake via PEG tube.  Sodium as high as 147.  Currently resolved.  Aortic atherosclerosis Noted on CT imaging  Retractile bilateral testis Noted on CT imaging. Will need to correlate clinically   DVT prophylaxis: SCDs Code Status:   Code Status: Full Code Family Communication: None at bedside.  Called son via telephone but no response. Disposition Plan: Discharge pending PT/OT recommendations and transition to non-IV antibiotic regimen   Consultants:  Palliative care medicine  Procedures:  None  Antimicrobials: Ceftriaxone Azithromycin Unasyn    Subjective: Patient reports being thirsty. No other concerns.  Objective: BP (!) 152/96   Pulse 89   Temp (!) 97 F (36.1 C) (Axillary)   Resp 20   Wt 52.3 kg Comment: Wt from 03/2023  SpO2 100%   BMI 15.64 kg/m   Examination:  General exam: Appears calm and comfortable. Cachectic appearing. Respiratory system: Clear to auscultation. Respiratory effort normal. Cardiovascular system: S1 & S2 heard, RRR.Marland Kitchen Gastrointestinal system: Abdomen is nondistended, soft and nontender. Normal bowel sounds heard. Central nervous system: Alert and oriented. No focal neurological deficits. Musculoskeletal: No edema. No calf tenderness Psychiatry: Judgement and insight appear normal. Mood & affect appropriate.    Data Reviewed: I have personally reviewed following labs and imaging studies   Last CBC Lab Results  Component Value Date   WBC 6.8 09/21/2023   HGB 9.2 (L) 09/21/2023   HCT 31.4 (L) 09/21/2023   MCV 88.2 09/21/2023   MCH 25.8 (L) 09/21/2023   RDW 16.6 (H) 09/21/2023   PLT 313 09/21/2023     Last  metabolic panel Lab Results  Component Value Date   GLUCOSE 96 09/21/2023   NA 144 09/21/2023   K 5.0 09/21/2023   CL 113 (H) 09/21/2023   CO2 22 09/21/2023   BUN 54 (H) 09/21/2023   CREATININE 1.07 09/21/2023   GFRNONAA >60 09/21/2023   CALCIUM 9.0 09/21/2023   PHOS 3.2 09/21/2023   PROT 7.4 09/21/2023   ALBUMIN 2.2 (L) 09/21/2023   LABGLOB 3.1 04/10/2023   AGRATIO 1.1 (L) 03/10/2022   BILITOT 0.4 09/21/2023   ALKPHOS 57 09/21/2023   AST 24 09/21/2023   ALT 18 09/21/2023   ANIONGAP 9 09/21/2023     Creatinine Clearance: Estimated Creatinine Clearance: 35.3 mL/min (by C-G formula based on SCr of 1.07 mg/dL).  Recent Results (from the past 240 hours)  Resp panel by RT-PCR (RSV, Flu A&B, Covid) Anterior Nasal Swab     Status: None   Collection Time: 09/20/23 11:38 PM   Specimen: Anterior Nasal Swab  Result Value Ref Range Status   SARS Coronavirus 2 by RT PCR NEGATIVE NEGATIVE Final   Influenza A by PCR NEGATIVE NEGATIVE Final   Influenza B by PCR NEGATIVE NEGATIVE Final    Comment: (NOTE) The Xpert Xpress SARS-CoV-2/FLU/RSV plus assay is intended as an aid in the diagnosis of influenza from Nasopharyngeal swab specimens and should not be used as a sole basis for treatment. Nasal washings and aspirates are unacceptable for Xpert Xpress SARS-CoV-2/FLU/RSV testing.  Fact Sheet for Patients: BloggerCourse.com  Fact Sheet for Healthcare Providers: SeriousBroker.it  This test is not yet approved or cleared by the [place] FDA and has been authorized for detection and/or diagnosis of SARS-CoV-2 by FDA under an Emergency Use Authorization (EUA). This EUA will remain in effect (meaning this test can be used) for the duration of the COVID-19 declaration under Section 564(b)(1) of the Act, 21 U.S.C. section 360bbb-3(b)(1), unless the authorization is terminated or revoked.     Resp Syncytial Virus by PCR NEGATIVE  NEGATIVE Final    Comment: (NOTE) Fact Sheet for Patients: BloggerCourse.com  Fact Sheet for Healthcare Providers: SeriousBroker.it  This test is not yet approved or cleared by the [place] FDA and has been authorized for detection and/or diagnosis of SARS-CoV-2 by FDA under an Emergency Use Authorization (EUA). This EUA will remain in effect (meaning this test can be used) for the duration of the COVID-19 declaration under Section 564(b)(1) of the Act, 21 U.S.C. section 360bbb-3(b)(1), unless the authorization is terminated or revoked.  Performed at [facility], [address] [place], [place] [address]       Radiology Studies: CT Head Wo Contrast Result Date: 09/20/2023 CLINICAL DATA:  Altered mental status EXAM: CT HEAD WITHOUT CONTRAST TECHNIQUE: Contiguous axial images were obtained from the base of the skull through the vertex without intravenous contrast. RADIATION DOSE REDUCTION: This exam was performed according to the departmental dose-optimization program which includes automated exposure control, adjustment of the mA and/or kV according to patient size and/or use of iterative reconstruction technique. COMPARISON:  04/16/2019 FINDINGS: Brain: No evidence of acute infarction, hemorrhage, hydrocephalus, extra-axial collection or mass lesion/mass effect. Cavum septum pellucidum is again seen, normal variant. Scattered atrophic and chronic white matter ischemic changes are noted. Vascular: No hyperdense vessel or unexpected calcification. Skull: Normal. Negative for fracture or focal lesion. Sinuses/Orbits: Mild mucosal changes are noted in the sphenoid and ethmoid sinuses. Other: None IMPRESSION: Chronic atrophic and ischemic changes are noted. Electronically Signed   By: Alcide Clever  M.D.   On: 09/20/2023 22:25   CT Cervical Spine Wo Contrast Result Date: 09/20/2023 CLINICAL DATA:  Neck trauma (Age >= 65y); Facial  trauma, blunt EXAM: CT MAXILLOFACIAL WITHOUT CONTRAST CT CERVICAL SPINE WITHOUT CONTRAST TECHNIQUE: Multidetector CT imaging of the maxillofacial structures was performed. Multiplanar CT image reconstructions were also generated. A small metallic BB was placed on the right temple in order to reliably differentiate right from left. Multidetector CT imaging of the cervical spine was performed without intravenous contrast. Multiplanar CT image reconstructions were also generated. RADIATION DOSE REDUCTION: This exam was performed according to the departmental dose-optimization program which includes automated exposure control, adjustment of the mA and/or kV according to patient size and/or use of iterative reconstruction technique. COMPARISON:  CT head 09/20/2023, CT head 04/01/2022, CT head 01/02/2017. FINDINGS: CT MAXILLOFACIAL FINDINGS Osseous: No fracture or mandibular dislocation. No destructive process. 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 Orbits: Negative. No traumatic or inflammatory finding. Sinuses: Bilateral sphenoid, left ethmoid, left maxillary, left frontal mucosal thickening. Paranasal sinuses otherwise clear. Mastoid air cells are clear. Some interval sclerosis of the left mastoid temporal bone. Soft tissues: Negative. Limited intracranial: Please see separately dictated CT head 09/20/2023. CT CERVICAL FINDINGS Alignment: Normal. Skull base and vertebrae: Multilevel severe degenerative changes of the spine. No associated severe osseous neural foraminal or central canal stenosis. No acute fracture. No aggressive appearing focal osseous lesion or focal pathologic process. Soft tissues and spinal canal: No prevertebral fluid or swelling. No visible canal hematoma. Upper chest: Unremarkable. Other: Atherosclerotic plaque of the carotid arteries within the neck. IMPRESSION: 1. No acute displaced facial fracture. 2. No acute displaced fracture or traumatic  listhesis of the cervical spine. Electronically Signed   By: Tish Frederickson M.D.   On: 09/20/2023 21:47   CT Maxillofacial WO CM Result Date: 09/20/2023 CLINICAL DATA:  Neck trauma (Age >= 65y); Facial trauma, blunt EXAM: CT MAXILLOFACIAL WITHOUT CONTRAST CT CERVICAL SPINE WITHOUT CONTRAST TECHNIQUE: Multidetector CT imaging of the maxillofacial structures was performed. Multiplanar CT image reconstructions were also generated. A small metallic BB was placed on the right temple in order to reliably differentiate right from left. Multidetector CT imaging of the cervical spine was performed without intravenous contrast. Multiplanar CT image reconstructions were also generated. RADIATION DOSE REDUCTION: This exam was performed according to the departmental dose-optimization program which includes automated exposure control, adjustment of the mA and/or kV according to patient size and/or use of iterative reconstruction technique. COMPARISON:  CT head 09/20/2023, CT head 04/01/2022, CT head 01/02/2017. FINDINGS: CT MAXILLOFACIAL FINDINGS Osseous: No fracture or mandibular dislocation. No destructive process. 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 Orbits: Negative. No traumatic or inflammatory finding. Sinuses: Bilateral sphenoid, left ethmoid, left maxillary, left frontal mucosal thickening. Paranasal sinuses otherwise clear. Mastoid air cells are clear. Some interval sclerosis of the left mastoid temporal bone. Soft tissues: Negative. Limited intracranial: Please see separately dictated CT head 09/20/2023. CT CERVICAL FINDINGS Alignment: Normal. Skull base and vertebrae: Multilevel severe degenerative changes of the spine. No associated severe osseous neural foraminal or central canal stenosis. No acute fracture. No aggressive appearing focal osseous lesion or focal pathologic process. Soft tissues and spinal canal: No prevertebral fluid or swelling. No visible canal  hematoma. Upper chest: Unremarkable. Other: Atherosclerotic plaque of the carotid arteries within the neck. IMPRESSION: 1. No acute displaced facial fracture. 2. No acute displaced fracture or traumatic listhesis of the cervical spine. Electronically Signed  By: Tish Frederickson M.D.   On: 09/20/2023 21:47   CT CHEST ABDOMEN PELVIS W CONTRAST Result Date: 09/20/2023 CLINICAL DATA:  Sepsis EXAM: CT CHEST, ABDOMEN, AND PELVIS WITH CONTRAST TECHNIQUE: Multidetector CT imaging of the chest, abdomen and pelvis was performed following the standard protocol during bolus administration of intravenous contrast. RADIATION DOSE REDUCTION: This exam was performed according to the departmental dose-optimization program which includes automated exposure control, adjustment of the mA and/or kV according to patient size and/or use of iterative reconstruction technique. CONTRAST:  75mL OMNIPAQUE IOHEXOL 350 MG/ML SOLN COMPARISON:  CT abdomen pelvis 04/16/2023, CT angiography chest 12/10/2021 FINDINGS: CHEST: Cardiovascular: No aortic injury. The thoracic aorta is normal in caliber. The heart is normal in size. No significant pericardial effusion. No central or segmental pulmonary embolus. Limited evaluation more distally due to timing of contrast. The main pulmonary artery measures at the upper limits of normal. At least moderate atherosclerotic plaque of the aorta. Four-vessel coronary calcification. Mediastinum/Nodes: No pneumomediastinum. No mediastinal hematoma. The esophagus is unremarkable. The thyroid is unremarkable. The central airways are patent. No mediastinal, hilar, or axillary lymphadenopathy. Lungs/Pleura: Emphysematous changes. Patchy left upper lobe and left lower lobe airspace opacities. Similar finding within the right lower lobe. Stable several scattered pulmonary micronodules-no further follow-up indicated. Debris within the left lower lobe bronchials (25:41). No pulmonary mass. No pulmonary contusion or  laceration. No pneumatocele formation. No pleural effusion. No pneumothorax. No hemothorax. Musculoskeletal/Chest wall: No chest wall mass. No acute rib or sternal fracture.Diffusely decreased bone density. Multilevel severe degenerative changes of the spine. Chronic multilevel mild thoracic vertebral body height loss. Severe degenerative changes of the shoulders. Old nonunionized fracture of the left humeral neck. Old chronic left glenohumeral joint subluxation. ABDOMEN / PELVIS: Hepatobiliary: Not enlarged. No focal lesion. No laceration or subcapsular hematoma. The gallbladder is otherwise unremarkable with no radio-opaque gallstones. No biliary ductal dilatation. Pancreas: Normal pancreatic contour. No main pancreatic duct dilatation. Spleen: Not enlarged. No focal lesion. No laceration, subcapsular hematoma, or vascular injury. Adrenals/Urinary Tract: No nodularity bilaterally. Bilateral kidneys enhance symmetrically. No hydronephrosis. No contusion, laceration, or subcapsular hematoma. No injury to the vascular structures or collecting systems. No hydroureter. The urinary bladder is unremarkable. Stomach/Bowel: Gastrostomy tube with tip and inflated balloon terminating within the gastric lumen. No small or large bowel wall thickening or dilatation. Increased stool burden throughout the colon. A 7 cm rectal stool ball. The appendix is unremarkable. Vasculature/Lymphatics: No abdominal aorta or iliac aneurysm. No active contrast extravasation or pseudoaneurysm. No abdominal, pelvic, inguinal lymphadenopathy. Reproductive: Prostate enlarged measuring up to 5.4 cm. Question retractile bilateral testis noted along the inguinal canal. Other: No simple free fluid ascites. No pneumoperitoneum. No hemoperitoneum. No mesenteric hematoma identified. No organized fluid collection. Musculoskeletal: No significant soft tissue hematoma. No acute pelvic fracture. No spinal fracture. Diffusely decreased bone density.  Multilevel severe degenerative changes of the spine. L4-L5 posterolateral interbody surgical hardware fusion. No CT evidence of surgical hardware complication. Other ports and devices: None. IMPRESSION: 1. Multifocal pneumonia with debris noted within the left lower bronchials. Recommend follow-up CT in 3 months to evaluate for resolution exclude underlying masses. 2. No central or segmental pulmonary embolus. Limited evaluation more distally due to timing of contrast. 3. Constipation with 7 cm rectal stool ball. 4. Gastrostomy tube in good position. 5. Aortic Atherosclerosis (ICD10-I70.0) including four-vessel coronary calcification. 6.  Emphysema (ICD10-J43.9). 7. Question retractile bilateral testis noted along the inguinal canal. Recommend correlation with physical exam. Electronically Signed   By:  Tish Frederickson M.D.   On: 09/20/2023 21:34   DG Chest Port 1 View Result Date: 09/20/2023 CLINICAL DATA:  Syncope and subsequent fall. EXAM: PORTABLE CHEST 1 VIEW COMPARISON:  April 16, 2023 FINDINGS: The heart size and mediastinal contours are within normal limits. The lungs are hyperinflated. Mild areas of scarring, atelectasis and/or infiltrate are seen within the mid left lung and right lung base. No pleural effusion or pneumothorax is identified. A chronic left humeral neck fracture is noted. Multilevel degenerative changes are seen throughout the thoracic spine. IMPRESSION: Mild mid left lung and right basilar scarring, atelectasis and/or infiltrate. Electronically Signed   By: Aram Candela M.D.   On: 09/20/2023 19:04      LOS: 0 days    Jacquelin Hawking, MD [facility] 09/21/2023, 8:03 AM   If 7PM-7AM, please contact night-coverage [email or web address]

## 2023-09-21 NOTE — Plan of Care (Signed)

## 2023-09-21 NOTE — ED Notes (Signed)
 Patient moved around in bed and IV was caught on blankets and removed accidentally.

## 2023-09-21 NOTE — Assessment & Plan Note (Signed)
 Mild will rehydrate  And repeat

## 2023-09-21 NOTE — Progress Notes (Addendum)
 Pharmacy Antibiotic Note  [name] [name] is a 89 y.o. male admitted on 09/20/2023 with loss of consciousness and concerns for aspiration pneumonia.  Pharmacy has been consulted for Unasyn dosing.  -Ceftriaxone IV x1 in ED -sCr 1.26 (~bl), afebrile -Cta: Multifocal pneumonia with debris noted within the left lower bronchials  Plan: Unasyn 3g IV every 6 hours  Azithromycin 500mg  IV every 24 hours per MD Monitor renal function  Follow up signs of clinical improvement, LOT, de-escalation of antibiotics    Temp (24hrs), Avg:98.1 F (36.7 C), Min:98 F (36.7 C), Max:98.1 F (36.7 C)  Recent Labs  Lab 09/20/23 1733 09/20/23 1746 09/20/23 1932  WBC 6.5  --   --   CREATININE 1.26*  --   --   LATICACIDVEN  --  2.1* 1.8    CrCl cannot be calculated (Unknown ideal weight.).    No Known Allergies  Antimicrobials this admission: Unasyn 3/27 >>  Azithromycin 3/27 >>   Microbiology results: 3/26 BCx:   Thank you for allowing pharmacy to be a part of this patient's care.  Arabella Merles, PharmD. Clinical Pharmacist 09/21/2023 12:06 AM

## 2023-09-21 NOTE — Evaluation (Signed)
 Clinical/Bedside Swallow Evaluation Patient Details  Name: [name] [name] MRN: [id number] Date of Birth: May 19, 1934  Today's Date: 09/21/2023 Time: SLP Start Time (ACUTE ONLY): 4098 SLP Stop Time (ACUTE ONLY): 0930 SLP Time Calculation (min) (ACUTE ONLY): 14 min  Past Medical History:  Past Medical History:  Diagnosis Date   Back pain 10/2008   per MRI 11/06/2008 - severe spinal stenosis worse at L4-5, L3-4, with neural compression at those levels   BPH (benign prostatic hyperplasia)    Eczema    Gout    Hypertension    Hypothyroidism    Past Surgical History:  Past Surgical History:  Procedure Laterality Date   ESOPHAGOGASTRODUODENOSCOPY N/A 04/03/2022   Procedure: ESOPHAGOGASTRODUODENOSCOPY (EGD);  Surgeon: Jeani Hawking, MD;  Location: [name] ENDOSCOPY;  Service: Gastroenterology;  Laterality: N/A;   LAMINECTOMY  12/11/2008    Bilateral L2, L3, and L4 laminotomies, done by Dr. Lovell Sheehan   LAPAROSCOPY N/A 04/06/2022   Procedure: LAPAROSCOPIC ASSISTED PERCUTANEOUS ENDOSCOPIC GASTROSTOMY PLACEMENT;  Surgeon: Manus Rudd, MD;  Location: [facility] OR;  Service: General;  Laterality: N/A;   HPI:  [name] [name] is a 89 y.o. male who presented with fall from home with an episode of syncope and hitting head.  He has been deteriorating progressively at home.  Has significant dementia at baseline completely tube feed dependent.  Family unable to pay for tube feedings going forward and they have run out of all his tube feedings. Patient has been moaning and has been ongoing for at least a month family states that they no longer able to take care of him at home patient found to be significantly dehydrated.  CT head 3/26 without acute findings. CT chest 3/26: "Multifocal pneumonia with debris noted within the left lower  bronchials. Recommend follow-up CT in 3 months to evaluate for  resolution exclude underlying masses."  Pt with medical history significant of Dementia, eczema, HTN, hypothyroidism, BPH, normocytic  anemia, gout, venous insufficiency, dysphagia with PEG placement 03/2022.    Assessment / Plan / Recommendation  Clinical Impression  Pt presents with clinical indicators of pharyngeal dysphagia.  Pt with known hx of severe pharyngeal dysphagia and has been PEG dependent since October 2023.  Pt's dysphagia is largely structural in nature.  SLP provided oral care prior to administration of PO trials.  Dried secretions noted in oral cavity, especially on palate.  Pt was somewhat resistant to oral care. Today pt exhbited multiple swallows, wet vocal quality, coughing and throat clearing with ice chips and small amounts of water by soon. Given anatomical factors impacting swallow, pt is unlikely to have had an improvement in swallow function since MBS completed 04/04/22.  Pt appears contracted and positioning appears to be a barrier to repeat swallow evaluation as well. Unfortunately, PEG placement does not eliminate risk of aspiration and pt may aspirate secretions contributing to present pneumonia.  Recommend regular, thorough oral care to reduce risk of developing aspiration pneumonia. Consider palliative care consult.  Pt has no further ST needs.  SLP will sign off at this time.  Recommend pt remain NPO with alternate means of nutrition, hydration, and medication. Pt may have ice chips in moderation and small amounts of water by spoon for comfort, after good oral care, when fully awake/alert, with upright positioning and 1:1 assitance.     SLP Visit Diagnosis: Dysphagia, oropharyngeal phase (R13.12)    Aspiration Risk  Severe aspiration risk;Risk for inadequate nutrition/hydration    Diet Recommendation NPO;Alternative means - long-term    Medication Administration: Via  alternative means    Other  Recommendations Recommended Consults:  (Consider palliative consult) Oral Care Recommendations: Oral care QID    Recommendations for follow up therapy are one component of a multi-disciplinary  discharge planning process, led by the attending physician.  Recommendations may be updated based on patient status, additional functional criteria and insurance authorization.  Follow up Recommendations No SLP follow up      Assistance Recommended at Discharge  N/A  Functional Status Assessment Patient has not had a recent decline in their functional status  Frequency and Duration   N/A         Prognosis Prognosis for improved oropharyngeal function: Guarded Barriers to Reach Goals: Time post onset;Severity of deficits;Cognitive deficits      Swallow Study   General Date of Onset: 09/20/23 HPI: [name] [name] is a 89 y.o. male who presented with fall from home with an episode of syncope and hitting head.  He has been deteriorating progressively at home.  Has significant dementia at baseline completely tube feed dependent.  Family unable to pay for tube feedings going forward and they have run out of all his tube feedings. Patient has been moaning and has been ongoing for at least a month family states that they no longer able to take care of him at home patient found to be significantly dehydrated.  CT head 3/26 without acute findings. CT chest 3/26: "Multifocal pneumonia with debris noted within the left lower  bronchials. Recommend follow-up CT in 3 months to evaluate for  resolution exclude underlying masses."  Pt with medical history significant of Dementia, eczema, HTN, hypothyroidism, BPH, normocytic anemia, gout, venous insufficiency, dysphagia with PEG placement 03/2022. Type of Study: Bedside Swallow Evaluation Previous Swallow Assessment: MBSS 03/2022 Diet Prior to this Study: NPO Temperature Spikes Noted: No History of Recent Intubation: No Behavior/Cognition: Alert;Requires cueing;Doesn't follow directions Oral Care Completed by SLP: Yes Oral Cavity - Dentition: Poor condition;Missing dentition Baseline Vocal Quality: Normal Volitional Cough: Cognitively unable to  elicit Volitional Swallow: Unable to elicit    Oral/Motor/Sensory Function Overall Oral Motor/Sensory Function:  (Unable to assess)   Ice Chips Ice chips: Impaired Pharyngeal Phase Impairments: Multiple swallows;Wet Vocal Quality;Cough - Delayed;Decreased hyoid-laryngeal movement   Thin Liquid Thin Liquid: Impaired Oral Phase Functional Implications: Left anterior spillage Pharyngeal  Phase Impairments: Multiple swallows;Cough - Immediate;Cough - Delayed;Wet Vocal Quality;Decreased hyoid-laryngeal movement    Nectar Thick Nectar Thick Liquid: Not tested   Honey Thick Honey Thick Liquid: Not tested   Puree Puree: Not tested   Solid     Solid: Not tested      Kerrie Pleasure, MA, CCC-SLP Acute Rehabilitation Services Office: [phone] 09/21/2023,9:57 AM

## 2023-09-22 ENCOUNTER — Inpatient Hospital Stay (HOSPITAL_COMMUNITY): Payer: MEDICAID

## 2023-09-22 LAB — BASIC METABOLIC PANEL WITH GFR
Anion gap: 8 (ref 5–15)
BUN: 52 mg/dL — ABNORMAL HIGH (ref 8–23)
CO2: 24 mmol/L (ref 22–32)
Calcium: 9.2 mg/dL (ref 8.9–10.3)
Chloride: 114 mmol/L — ABNORMAL HIGH (ref 98–111)
Creatinine, Ser: 1.36 mg/dL — ABNORMAL HIGH (ref 0.61–1.24)
GFR, Estimated: 50 mL/min — ABNORMAL LOW (ref >60)
Glucose, Bld: 89 mg/dL (ref 70–99)
Potassium: 5.2 mmol/L — ABNORMAL HIGH (ref 3.5–5.1)
Sodium: 146 mmol/L — ABNORMAL HIGH (ref 135–145)

## 2023-09-22 LAB — CBC
HCT: 29.2 % — ABNORMAL LOW (ref 39.0–52.0)
Hemoglobin: 8.7 g/dL — ABNORMAL LOW (ref 13.0–17.0)
MCH: 25.8 pg — ABNORMAL LOW (ref 26.0–34.0)
MCHC: 29.8 g/dL — ABNORMAL LOW (ref 30.0–36.0)
MCV: 86.6 fL (ref 80.0–100.0)
Platelets: 282 10*3/uL (ref 150–400)
RBC: 3.37 MIL/uL — ABNORMAL LOW (ref 4.22–5.81)
RDW: 16.7 % — ABNORMAL HIGH (ref 11.5–15.5)
WBC: 14.1 10*3/uL — ABNORMAL HIGH (ref 4.0–10.5)
nRBC: 0 % (ref 0.0–0.2)

## 2023-09-22 LAB — PHOSPHORUS: Phosphorus: 4.3 mg/dL (ref 2.5–4.6)

## 2023-09-22 LAB — T3: T3, Total: 45 ng/dL — ABNORMAL LOW (ref 71–180)

## 2023-09-22 LAB — MAGNESIUM: Magnesium: 2.4 mg/dL (ref 1.7–2.4)

## 2023-09-22 LAB — POTASSIUM: Potassium: 5.2 mmol/L — ABNORMAL HIGH (ref 3.5–5.1)

## 2023-09-22 MED ORDER — DEXTROSE-SODIUM CHLORIDE 5-0.45 % IV SOLN
INTRAVENOUS | Status: AC
  Filled 2023-09-22: qty 1000

## 2023-09-22 MED ORDER — JEVITY 1.5 CAL/FIBER PO LIQD
237.0000 mL | Freq: Every day | ORAL | Status: DC
  Filled 2023-09-22: qty 237

## 2023-09-22 MED ORDER — JEVITY 1.5 CAL/FIBER PO LIQD
350.0000 mL | Freq: Three times a day (TID) | ORAL | Status: DC
  Filled 2023-09-22 (×2): qty 474

## 2023-09-22 MED ORDER — ADULT MULTIVITAMIN W/MINERALS CH
1.0000 | ORAL_TABLET | Freq: Every day | ORAL | Status: DC
  Administered 2023-09-22 – 2023-09-30 (×8): 1 via ORAL
  Filled 2023-09-22 (×8): qty 1

## 2023-09-22 MED ORDER — SODIUM CHLORIDE 0.9 % IV SOLN
3.0000 g | Freq: Two times a day (BID) | INTRAVENOUS | Status: DC
  Administered 2023-09-22 – 2023-09-25 (×6): 3 g via INTRAVENOUS
  Filled 2023-09-22 (×6): qty 8

## 2023-09-22 MED ORDER — PROSOURCE TF20 ENFIT COMPATIBL EN LIQD: 60.0000 mL | Freq: Every day | ENTERAL | Status: DC

## 2023-09-22 MED ORDER — JEVITY 1.5 CAL/FIBER PO LIQD
1440.0000 mL | ORAL | Status: DC
  Administered 2023-09-22 – 2023-09-23 (×2): 1440 mL
  Administered 2023-09-26: 1000 mL
  Administered 2023-09-27 – 2023-09-29 (×2): 1440 mL
  Filled 2023-09-22: qty 1659
  Filled 2023-09-22 (×9): qty 2000

## 2023-09-22 MED ORDER — SODIUM ZIRCONIUM CYCLOSILICATE 10 G PO PACK
10.0000 g | PACK | Freq: Once | ORAL | Status: AC
  Administered 2023-09-22: 10 g
  Filled 2023-09-22: qty 1

## 2023-09-22 MED ORDER — PROSOURCE TF20 ENFIT COMPATIBL EN LIQD
60.0000 mL | Freq: Two times a day (BID) | ENTERAL | Status: DC
  Administered 2023-09-22 – 2023-09-26 (×9): 60 mL
  Filled 2023-09-22 (×8): qty 60

## 2023-09-22 NOTE — Progress Notes (Signed)
 Patient in fetal position and will not cooperate due to dementia.  Cannot complete echo in fetal position.

## 2023-09-22 NOTE — Progress Notes (Signed)
 PROGRESS NOTE    [name] [name]  WUJ:[id number] DOB: 1934-01-29 DOA: 09/20/2023 PCP: Marcine Matar, MD   Brief Narrative: [name] [name] is a 89 y.o. male with a history of dementia, hypertension, hypothyroidism, BPH, anemia, gout, venous insufficiency, dysphagia status post PEG tube placement.  Patient presented secondary to loss of consciousness and failure to thrive.  Syncope workup initiated.  Patient was found to have evidence of multifocal pneumonia started on empiric antibiotics.   Assessment/Plan:  Syncope Unclear etiology. Troponin elevated with negative delta. No chest pain. EKG non-acute. Transthoracic Echocardiogram ordered and pending. CT head imaging non-acute.  Multifocal pneumonia Possible aspiration etiology. Patient initially given Ceftriaxone/Azithromycin and transitioned to Unasyn and azithromycin. No leukocytosis. Procalcitonin is undetectable. COVID-19, RSV and influenza negative. Recommendation for repeat CT scan in 3 months to evaluate for resolution and exclude underlying masses. Blood cultures no growth to date. -Continue Unasyn IV and azithromycin -Flutter valve/incentive spirometer if able -Follow-up blood cultures  Failure to thrive -Palliative care consult -PT/OT  Primary hypertension Patient is listed as taking metoprolol tartrate, but however is not taking as an outpatient.  Blood pressure is uncontrolled. -Start amlodipine 5 mg daily.  BPH Noted. Not on medication therapy as an outpatient.  Elevated creatinine Baseline creatinine of about 1-1.1. Secondary to poor fluid intake. Creatinine up to 1.36. -D5 IV fluids -Free water per PEG tube  PEG tube dependence Dysphagia Noted.  Patient is on T-piece as outpatient.  Tube feeds held currently. Tube confirmed in good position on CT imaging.  Speech therapy consulted. -SLP recommendations (3/27): Aspiration Risk Severe aspiration risk;Risk for inadequate nutrition/hydration  Diet  Recommendation NPO;Alternative means - long-term   Medication Administration:  Via alternative means  Other  Recommendations Recommended Consults:  (Consider palliative consult) Oral Care Recommendations: Oral care QID    -Dietitian recommendations (3/28): Initiate tube feeding via PEG: Jevity 1.5 at 60 ml/h (1440 ml per day) Prosource TF20 60 ml BID. This regimen provides: 2320 kcal, 110g protein, free water  TF regimen reccomendation at d/c: 1.5 cartons Jevity 1.5 QID + 1 carton Jevity 1.5 once daily (7 cartons daily) 30ml free water flushes before and after each bolus ( total) Prosource 60 mL daily. This provides 80 kcal and 20g of protein per packet This regimen provides: 2565 kcal, 126g protein, free water  Hypothyroidism -Continue levothyroxine 75 mcg daily per tube  Severe malnutrition Patient is currently PEG tube dependent.  Dietitian consulted on admission.  Constipation Rectal stool ball noted on CT imaging measuring 7 cm. -Dulcolax suppository; if fails, SMOG enema  Dementia Noted. -Delirium precautions  Pressure injury Noted on admission.  Wound care consulted.  Hyperkalemia Potassium of 5.3 on admission with peak of 6.2.  Initially resolved with fluid, now recurrent. -Lokelma -BMP in AM  Hypernatremia Likely related to dehydration from no oral intake and poor intake via PEG tube.  Sodium as high as 147.  Initially improved, but now recurrent off of IV fluids. -IV fluids -BMP in AM  Aortic atherosclerosis Noted on CT imaging  Retractile bilateral testis Noted on CT imaging. Will need to correlate clinically   DVT prophylaxis: SCDs Code Status:   Code Status: Full Code Family Communication: None at bedside.  Called son via telephone (3/28) but no response. Disposition Plan: Discharge to LTC if able. Medically stable for discharge in 24 hours if electrolytes and renal function improved/stable.   Consultants:  Palliative care  medicine  Procedures:  None  Antimicrobials: Ceftriaxone Azithromycin Unasyn  Subjective: No concerns this morning. No chest pain or dyspnea.  Objective: BP (!) 121/50   Pulse 69   Temp (!) 96.7 F (35.9 C) (Axillary)   Resp 19   Ht 6' (1.829 m)   Wt 51.1 kg   SpO2 100%   BMI 15.28 kg/m   Examination:  General exam: Appears calm and comfortable Cachectic. Chronically ill appearing. Respiratory system: Clear to auscultation. Respiratory effort normal. Cardiovascular system: S1 & S2 heard, RRR. No murmurs Gastrointestinal system: Abdomen is nondistended, soft and nontender. Normal bowel sounds heard.   Data Reviewed: I have personally reviewed following labs and imaging studies   Last CBC Lab Results  Component Value Date   WBC 14.1 (H) 09/22/2023   HGB 8.7 (L) 09/22/2023   HCT 29.2 (L) 09/22/2023   MCV 86.6 09/22/2023   MCH 25.8 (L) 09/22/2023   RDW 16.7 (H) 09/22/2023   PLT 282 09/22/2023     Last metabolic panel Lab Results  Component Value Date   GLUCOSE 89 09/22/2023   NA 146 (H) 09/22/2023   K 5.2 (H) 09/22/2023   CL 114 (H) 09/22/2023   CO2 24 09/22/2023   BUN 52 (H) 09/22/2023   CREATININE 1.36 (H) 09/22/2023   GFRNONAA 50 (L) 09/22/2023   CALCIUM 9.2 09/22/2023   PHOS 4.3 09/22/2023   PROT 7.4 09/21/2023   ALBUMIN 2.2 (L) 09/21/2023   LABGLOB 3.1 04/10/2023   AGRATIO 1.1 (L) 03/10/2022   BILITOT 0.4 09/21/2023   ALKPHOS 57 09/21/2023   AST 24 09/21/2023   ALT 18 09/21/2023   ANIONGAP 8 09/22/2023     Creatinine Clearance: Estimated Creatinine Clearance: 27.1 mL/min (A) (by C-G formula based on SCr of 1.36 mg/dL (H)).  Recent Results (from the past 240 hours)  Culture, blood (Routine X 2) w Reflex to ID Panel     Status: None (Preliminary result)   Collection Time: 09/20/23  5:33 PM   Specimen: BLOOD RIGHT ARM  Result Value Ref Range Status   Specimen Description BLOOD RIGHT ARM  Final   Special Requests   Final    BOTTLES  DRAWN AEROBIC AND ANAEROBIC Blood Culture results may not be optimal due to an inadequate volume of blood received in culture bottles   Culture   Final    NO GROWTH 2 DAYS Performed at [facility], [address] [place], [place] [address]    Report Status PENDING  Incomplete  Culture, blood (Routine X 2) w Reflex to ID Panel     Status: None (Preliminary result)   Collection Time: 09/20/23  5:34 PM   Specimen: BLOOD  Result Value Ref Range Status   Specimen Description BLOOD SITE NOT SPECIFIED  Final   Special Requests   Final    BOTTLES DRAWN AEROBIC AND ANAEROBIC Blood Culture results may not be optimal due to an inadequate volume of blood received in culture bottles   Culture   Final    NO GROWTH 2 DAYS Performed at [facility], [address], [place], [place] [address]    Report Status PENDING  Incomplete  Resp panel by RT-PCR (RSV, Flu A&B, Covid) Anterior Nasal Swab     Status: None   Collection Time: 09/20/23 11:38 PM   Specimen: Anterior Nasal Swab  Result Value Ref Range Status   SARS Coronavirus 2 by RT PCR NEGATIVE NEGATIVE Final   Influenza A by PCR NEGATIVE NEGATIVE Final   Influenza B by PCR NEGATIVE NEGATIVE Final  Comment: (NOTE) The Xpert Xpress SARS-CoV-2/FLU/RSV plus assay is intended as an aid in the diagnosis of influenza from Nasopharyngeal swab specimens and should not be used as a sole basis for treatment. Nasal washings and aspirates are unacceptable for Xpert Xpress SARS-CoV-2/FLU/RSV testing.  Fact Sheet for Patients: BloggerCourse.com  Fact Sheet for Healthcare Providers: SeriousBroker.it  This test is not yet approved or cleared by the [place] FDA and has been authorized for detection and/or diagnosis of SARS-CoV-2 by FDA under an Emergency Use Authorization (EUA). This EUA will remain in effect (meaning this test can be used) for the duration of the COVID-19 declaration  under Section 564(b)(1) of the Act, 21 U.S.C. section 360bbb-3(b)(1), unless the authorization is terminated or revoked.     Resp Syncytial Virus by PCR NEGATIVE NEGATIVE Final    Comment: (NOTE) Fact Sheet for Patients: BloggerCourse.com  Fact Sheet for Healthcare Providers: SeriousBroker.it  This test is not yet approved or cleared by the [place] FDA and has been authorized for detection and/or diagnosis of SARS-CoV-2 by FDA under an Emergency Use Authorization (EUA). This EUA will remain in effect (meaning this test can be used) for the duration of the COVID-19 declaration under Section 564(b)(1) of the Act, 21 U.S.C. section 360bbb-3(b)(1), unless the authorization is terminated or revoked.  Performed at [facility], [address] [place], [place] [address]   Respiratory (~20 pathogens) panel by PCR     Status: None   Collection Time: 09/21/23  8:41 AM   Specimen: Nasopharyngeal Swab; Respiratory  Result Value Ref Range Status   Adenovirus NOT DETECTED NOT DETECTED Final   Coronavirus 229E NOT DETECTED NOT DETECTED Final    Comment: (NOTE) The Coronavirus on the Respiratory Panel, DOES NOT test for the novel  Coronavirus (2019 nCoV)    Coronavirus HKU1 NOT DETECTED NOT DETECTED Final   Coronavirus NL63 NOT DETECTED NOT DETECTED Final   Coronavirus OC43 NOT DETECTED NOT DETECTED Final   Metapneumovirus NOT DETECTED NOT DETECTED Final   Rhinovirus / Enterovirus NOT DETECTED NOT DETECTED Final   Influenza A NOT DETECTED NOT DETECTED Final   Influenza B NOT DETECTED NOT DETECTED Final   Parainfluenza Virus 1 NOT DETECTED NOT DETECTED Final   Parainfluenza Virus 2 NOT DETECTED NOT DETECTED Final   Parainfluenza Virus 3 NOT DETECTED NOT DETECTED Final   Parainfluenza Virus 4 NOT DETECTED NOT DETECTED Final   Respiratory Syncytial Virus NOT DETECTED NOT DETECTED Final   Bordetella pertussis NOT DETECTED NOT  DETECTED Final   Bordetella Parapertussis NOT DETECTED NOT DETECTED Final   Chlamydophila pneumoniae NOT DETECTED NOT DETECTED Final   Mycoplasma pneumoniae NOT DETECTED NOT DETECTED Final    Comment: Performed at [facility], [address] [place], [place] [address]      Radiology Studies: CT Head Wo Contrast Result Date: 09/20/2023 CLINICAL DATA:  Altered mental status EXAM: CT HEAD WITHOUT CONTRAST TECHNIQUE: Contiguous axial images were obtained from the base of the skull through the vertex without intravenous contrast. RADIATION DOSE REDUCTION: This exam was performed according to the departmental dose-optimization program which includes automated exposure control, adjustment of the mA and/or kV according to patient size and/or use of iterative reconstruction technique. COMPARISON:  04/16/2019 FINDINGS: Brain: No evidence of acute infarction, hemorrhage, hydrocephalus, extra-axial collection or mass lesion/mass effect. Cavum septum pellucidum is again seen, normal variant. Scattered atrophic and chronic white matter ischemic changes are noted. Vascular: No hyperdense vessel or unexpected calcification. Skull: Normal. Negative  for fracture or focal lesion. Sinuses/Orbits: Mild mucosal changes are noted in the sphenoid and ethmoid sinuses. Other: None IMPRESSION: Chronic atrophic and ischemic changes are noted. Electronically Signed   By: Alcide Clever M.D.   On: 09/20/2023 22:25   CT Cervical Spine Wo Contrast Result Date: 09/20/2023 CLINICAL DATA:  Neck trauma (Age >= 65y); Facial trauma, blunt EXAM: CT MAXILLOFACIAL WITHOUT CONTRAST CT CERVICAL SPINE WITHOUT CONTRAST TECHNIQUE: Multidetector CT imaging of the maxillofacial structures was performed. Multiplanar CT image reconstructions were also generated. A small metallic BB was placed on the right temple in order to reliably differentiate right from left. Multidetector CT imaging of the cervical spine was performed without intravenous  contrast. Multiplanar CT image reconstructions were also generated. RADIATION DOSE REDUCTION: This exam was performed according to the departmental dose-optimization program which includes automated exposure control, adjustment of the mA and/or kV according to patient size and/or use of iterative reconstruction technique. COMPARISON:  CT head 09/20/2023, CT head 04/01/2022, CT head 01/02/2017. FINDINGS: CT MAXILLOFACIAL FINDINGS Osseous: No fracture or mandibular dislocation. No destructive process. 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 Orbits: Negative. No traumatic or inflammatory finding. Sinuses: Bilateral sphenoid, left ethmoid, left maxillary, left frontal mucosal thickening. Paranasal sinuses otherwise clear. Mastoid air cells are clear. Some interval sclerosis of the left mastoid temporal bone. Soft tissues: Negative. Limited intracranial: Please see separately dictated CT head 09/20/2023. CT CERVICAL FINDINGS Alignment: Normal. Skull base and vertebrae: Multilevel severe degenerative changes of the spine. No associated severe osseous neural foraminal or central canal stenosis. No acute fracture. No aggressive appearing focal osseous lesion or focal pathologic process. Soft tissues and spinal canal: No prevertebral fluid or swelling. No visible canal hematoma. Upper chest: Unremarkable. Other: Atherosclerotic plaque of the carotid arteries within the neck. IMPRESSION: 1. No acute displaced facial fracture. 2. No acute displaced fracture or traumatic listhesis of the cervical spine. Electronically Signed   By: Tish Frederickson M.D.   On: 09/20/2023 21:47   CT Maxillofacial WO CM Result Date: 09/20/2023 CLINICAL DATA:  Neck trauma (Age >= 65y); Facial trauma, blunt EXAM: CT MAXILLOFACIAL WITHOUT CONTRAST CT CERVICAL SPINE WITHOUT CONTRAST TECHNIQUE: Multidetector CT imaging of the maxillofacial structures was performed. Multiplanar CT image reconstructions were  also generated. A small metallic BB was placed on the right temple in order to reliably differentiate right from left. Multidetector CT imaging of the cervical spine was performed without intravenous contrast. Multiplanar CT image reconstructions were also generated. RADIATION DOSE REDUCTION: This exam was performed according to the departmental dose-optimization program which includes automated exposure control, adjustment of the mA and/or kV according to patient size and/or use of iterative reconstruction technique. COMPARISON:  CT head 09/20/2023, CT head 04/01/2022, CT head 01/02/2017. FINDINGS: CT MAXILLOFACIAL FINDINGS Osseous: No fracture or mandibular dislocation. No destructive process. 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 Orbits: Negative. No traumatic or inflammatory finding. Sinuses: Bilateral sphenoid, left ethmoid, left maxillary, left frontal mucosal thickening. Paranasal sinuses otherwise clear. Mastoid air cells are clear. Some interval sclerosis of the left mastoid temporal bone. Soft tissues: Negative. Limited intracranial: Please see separately dictated CT head 09/20/2023. CT CERVICAL FINDINGS Alignment: Normal. Skull base and vertebrae: Multilevel severe degenerative changes of the spine. No associated severe osseous neural foraminal or central canal stenosis. No acute fracture. No aggressive appearing focal osseous lesion or focal pathologic process. Soft tissues and spinal canal: No prevertebral fluid or swelling. No visible canal hematoma.  Upper chest: Unremarkable. Other: Atherosclerotic plaque of the carotid arteries within the neck. IMPRESSION: 1. No acute displaced facial fracture. 2. No acute displaced fracture or traumatic listhesis of the cervical spine. Electronically Signed   By: Tish Frederickson M.D.   On: 09/20/2023 21:47   CT CHEST ABDOMEN PELVIS W CONTRAST Result Date: 09/20/2023 CLINICAL DATA:  Sepsis EXAM: CT CHEST, ABDOMEN,  AND PELVIS WITH CONTRAST TECHNIQUE: Multidetector CT imaging of the chest, abdomen and pelvis was performed following the standard protocol during bolus administration of intravenous contrast. RADIATION DOSE REDUCTION: This exam was performed according to the departmental dose-optimization program which includes automated exposure control, adjustment of the mA and/or kV according to patient size and/or use of iterative reconstruction technique. CONTRAST:  75mL OMNIPAQUE IOHEXOL 350 MG/ML SOLN COMPARISON:  CT abdomen pelvis 04/16/2023, CT angiography chest 12/10/2021 FINDINGS: CHEST: Cardiovascular: No aortic injury. The thoracic aorta is normal in caliber. The heart is normal in size. No significant pericardial effusion. No central or segmental pulmonary embolus. Limited evaluation more distally due to timing of contrast. The main pulmonary artery measures at the upper limits of normal. At least moderate atherosclerotic plaque of the aorta. Four-vessel coronary calcification. Mediastinum/Nodes: No pneumomediastinum. No mediastinal hematoma. The esophagus is unremarkable. The thyroid is unremarkable. The central airways are patent. No mediastinal, hilar, or axillary lymphadenopathy. Lungs/Pleura: Emphysematous changes. Patchy left upper lobe and left lower lobe airspace opacities. Similar finding within the right lower lobe. Stable several scattered pulmonary micronodules-no further follow-up indicated. Debris within the left lower lobe bronchials (25:41). No pulmonary mass. No pulmonary contusion or laceration. No pneumatocele formation. No pleural effusion. No pneumothorax. No hemothorax. Musculoskeletal/Chest wall: No chest wall mass. No acute rib or sternal fracture.Diffusely decreased bone density. Multilevel severe degenerative changes of the spine. Chronic multilevel mild thoracic vertebral body height loss. Severe degenerative changes of the shoulders. Old nonunionized fracture of the left humeral neck. Old  chronic left glenohumeral joint subluxation. ABDOMEN / PELVIS: Hepatobiliary: Not enlarged. No focal lesion. No laceration or subcapsular hematoma. The gallbladder is otherwise unremarkable with no radio-opaque gallstones. No biliary ductal dilatation. Pancreas: Normal pancreatic contour. No main pancreatic duct dilatation. Spleen: Not enlarged. No focal lesion. No laceration, subcapsular hematoma, or vascular injury. Adrenals/Urinary Tract: No nodularity bilaterally. Bilateral kidneys enhance symmetrically. No hydronephrosis. No contusion, laceration, or subcapsular hematoma. No injury to the vascular structures or collecting systems. No hydroureter. The urinary bladder is unremarkable. Stomach/Bowel: Gastrostomy tube with tip and inflated balloon terminating within the gastric lumen. No small or large bowel wall thickening or dilatation. Increased stool burden throughout the colon. A 7 cm rectal stool ball. The appendix is unremarkable. Vasculature/Lymphatics: No abdominal aorta or iliac aneurysm. No active contrast extravasation or pseudoaneurysm. No abdominal, pelvic, inguinal lymphadenopathy. Reproductive: Prostate enlarged measuring up to 5.4 cm. Question retractile bilateral testis noted along the inguinal canal. Other: No simple free fluid ascites. No pneumoperitoneum. No hemoperitoneum. No mesenteric hematoma identified. No organized fluid collection. Musculoskeletal: No significant soft tissue hematoma. No acute pelvic fracture. No spinal fracture. Diffusely decreased bone density. Multilevel severe degenerative changes of the spine. L4-L5 posterolateral interbody surgical hardware fusion. No CT evidence of surgical hardware complication. Other ports and devices: None. IMPRESSION: 1. Multifocal pneumonia with debris noted within the left lower bronchials. Recommend follow-up CT in 3 months to evaluate for resolution exclude underlying masses. 2. No central or segmental pulmonary embolus. Limited evaluation  more distally due to timing of contrast. 3. Constipation with 7 cm rectal stool ball. 4.  Gastrostomy tube in good position. 5. Aortic Atherosclerosis (ICD10-I70.0) including four-vessel coronary calcification. 6.  Emphysema (ICD10-J43.9). 7. Question retractile bilateral testis noted along the inguinal canal. Recommend correlation with physical exam. Electronically Signed   By: Tish Frederickson M.D.   On: 09/20/2023 21:34   DG Chest Port 1 View Result Date: 09/20/2023 CLINICAL DATA:  Syncope and subsequent fall. EXAM: PORTABLE CHEST 1 VIEW COMPARISON:  April 16, 2023 FINDINGS: The heart size and mediastinal contours are within normal limits. The lungs are hyperinflated. Mild areas of scarring, atelectasis and/or infiltrate are seen within the mid left lung and right lung base. No pleural effusion or pneumothorax is identified. A chronic left humeral neck fracture is noted. Multilevel degenerative changes are seen throughout the thoracic spine. IMPRESSION: Mild mid left lung and right basilar scarring, atelectasis and/or infiltrate. Electronically Signed   By: Aram Candela M.D.   On: 09/20/2023 19:04      LOS: 1 day    Jacquelin Hawking, MD [facility] 09/22/2023, 2:27 PM   If 7PM-7AM, please contact night-coverage [email or web address]

## 2023-09-22 NOTE — TOC Initial Note (Addendum)
 Transition of Care [facility]) - Initial/Assessment Note    Patient Details  Name: [name] [name] MRN: [id number] Date of Birth: [birth year]  Transition of Care [facility]) CM/SW Contact:    Michaela Corner, LCSWA Phone Number: 09/22/2023, 10:38 AM  Clinical Narrative:     CSW spoke with Souzan, who states she is patients DIL. Souzan states that patient can return home with family and they are willing to provide care. CSW asked Souzan if patient has any insurance or income, Souzan stated no. CSW asked if financial counseling can be contacted to start medicaid screening for patient, she stated yes.   10:49 AM Per FC, patient will not be able to apply for medicaid at this time. Patient may be eligible for emergency Medicaid, FC will reach out to [employer] to start this process.   TOC will continue to follow.          Expected Discharge Plan: Home/Self Care Barriers to Discharge: Continued Medical Work up   Patient Goals and CMS Choice Patient states their goals for this hospitalization and ongoing recovery are:: To go home          Expected Discharge Plan and Services In-house Referral: Clinical Social Work     Living arrangements for the past 2 months: Single Family Home                                      Prior Living Arrangements/Services Living arrangements for the past 2 months: Single Family Home Lives with:: Adult Children Patient language and need for interpreter reviewed:: Yes Do you feel safe going back to the place where you live?: Yes      Need for Family Participation in Patient Care: Yes (Comment) Care giver support system in place?: Yes (comment)   Criminal Activity/Legal Involvement Pertinent to Current Situation/Hospitalization: No - Comment as needed  Activities of Daily Living   ADL Screening (condition at time of admission) Independently performs ADLs?: No Does the patient have a NEW difficulty with bathing/dressing/toileting/self-feeding that is  expected to last >3 days?: Yes (Initiates electronic notice to provider for possible OT consult) Does the patient have a NEW difficulty with getting in/out of bed, walking, or climbing stairs that is expected to last >3 days?: Yes (Initiates electronic notice to provider for possible PT consult) Does the patient have a NEW difficulty with communication that is expected to last >3 days?: Yes (Initiates electronic notice to provider for possible SLP consult) Is the patient deaf or have difficulty hearing?: No Does the patient have difficulty seeing, even when wearing glasses/contacts?: No Does the patient have difficulty concentrating, remembering, or making decisions?: No  Permission Sought/Granted Permission sought to share information with : Family Supports Permission granted to share information with : No (Patient is orientated x1, contacts are on patients chart)  Share Information with NAME: Paul Half     Permission granted to share info w Relationship: Son, DIL     Emotional Assessment Appearance:: Appears stated age Attitude/Demeanor/Rapport: Unable to Assess Affect (typically observed): Unable to Assess Orientation: : Oriented to Self Alcohol / Substance Use: Not Applicable Psych Involvement: No (comment)  Admission diagnosis:  Dehydration [E86.0] Failure to thrive (child) [R62.51] Multifocal pneumonia [J18.9] Hypothyroidism, unspecified type [E03.9] Patient Active Problem List   Diagnosis Date Noted   Decubitus skin ulcer 09/21/2023   Multifocal pneumonia 09/20/2023   Fall at home, initial encounter 09/20/2023  Failure to thrive in adult 09/20/2023   Dehydration 09/20/2023   Dementia (HCC) 09/20/2023   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   COVID-19 04/17/2023   Intractable nausea and vomiting 04/16/2023   COVID-19 virus infection 04/16/2023   SVT (supraventricular tachycardia) (HCC) 04/16/2023   Protein-calorie malnutrition, severe  04/08/2022   Dysphagia 04/02/2022   Aspiration pneumonia (HCC) 04/02/2022   Hyponatremia 04/02/2022   Elevated troponin 04/02/2022   AKI (acute kidney injury) (HCC) 04/02/2022   Hyperkalemia 12/07/2021   Edema 12/07/2021   Venous insufficiency of both lower extremities 12/07/2021   Eczema 12/07/2021   Hx of gout 01/24/2017   Hypothyroidism 10/30/2008   Anemia, chronic disease 09/18/2008   Essential hypertension 09/18/2008   BENIGN PROSTATIC HYPERTROPHY, WITH URINARY OBSTRUCTION 09/18/2008   Backache 09/18/2008   PCP:  Marcine Matar, MD Pharmacy:   [facility] [address], [address] [place] [address] Phone: [phone] Fax: [phone]  [facility] [address] [place] [address] Phone: [phone] Fax: [phone]  [id number] [place], [place] - [address] [facility] [address] [place] [address] Phone: [phone] Fax: [phone]     Social Drivers of Health (SDOH) Social History: SDOH Screenings   Food Insecurity: No Food Insecurity (09/21/2023)  Housing: Low Risk  (09/21/2023)  Transportation Needs: No Transportation Needs (09/21/2023)  Utilities: Not At Risk (09/21/2023)  Depression (PHQ2-9): Medium Risk (03/10/2022)  Social Connections: Moderately Integrated (09/21/2023)  Tobacco Use: Low Risk  (09/20/2023)   SDOH Interventions:     Readmission Risk Interventions     No data to display

## 2023-09-22 NOTE — Progress Notes (Addendum)
 Initial Nutrition Assessment  DOCUMENTATION CODES:   Severe malnutrition in context of social or environmental circumstances  INTERVENTION:   Initiate tube feeding via PEG: Jevity 1.5 at 60 ml/h (1440 ml per day) Prosource TF20 60 ml BID. This regimen provides: 2320 kcal, 110g protein, free water  TF regimen reccomendation at d/c: 1.5 cartons Jevity 1.5 QID + 1 carton Jevity 1.5 once daily (7 cartons daily) 30ml free water flushes before and after each bolus ( total) Prosource 60 mL daily. This provides 80 kcal and 20g of protein per packet This regimen provides: 2565 kcal, 126g protein, free water    NUTRITION DIAGNOSIS:   Severe Malnutrition related to social / environmental circumstances as evidenced by severe fat depletion, severe muscle depletion.  GOAL:   Patient will meet greater than or equal to 90% of their needs  MONITOR:   TF tolerance  REASON FOR ASSESSMENT:   Consult Diet education  ASSESSMENT:   PMH of Dementia, eczema,  HTN, hypothyroidism, BPH, normocytic anemia, gout, venous insufficiency, dysphagia with PEG placement 03/2022. Patient presents from home with an episode of syncope and hitting head.  Pt assessed at [facility] in Oct 2024. Pt is tube feed dependent and H&P reports he ran out of TF formula/family cannot afford formula at home. Pt was asleep and unable to arouse at RD visit. RD performed NFPE and placed bolus TF orders.   Medications reviewed and include: thiamine, synthroid, ferrous sulfate, D5 + NaCl @ 63mL/hr  Labs reviewed: Sodium 146 L, Potassium 5.2 H, BUN 52 H, GFR 56 L.    Intake/Output Summary (Last 24 hours) at 09/22/2023 1028 Last data filed at 09/22/2023 0634 Gross per 24 hour  Intake 873.99 ml  Output 1200 ml  Net -326.01 ml    Weights reviewed. Admit weight 54.1. -4kg (7%) wt loss in 6 months. Not significant per [facility]EN.   NUTRITION - FOCUSED PHYSICAL EXAM:  Flowsheet Row Most Recent Value   Orbital Region Severe depletion  Upper Arm Region Severe depletion  Thoracic and Lumbar Region Severe depletion  Buccal Region Severe depletion  Temple Region Severe depletion  Clavicle Bone Region Severe depletion  Clavicle and Acromion Bone Region Severe depletion  Scapular Bone Region Severe depletion  Dorsal Hand Severe depletion  Patellar Region Severe depletion  Anterior Thigh Region Severe depletion  Posterior Calf Region Severe depletion  Edema (RD Assessment) None  Hair Reviewed  Eyes Reviewed  Mouth Reviewed  Skin Reviewed  Nails Reviewed       Diet Order:   Diet Order             Diet NPO time specified  Diet effective now                   EDUCATION NEEDS:   Education needs have been addressed  Skin:  Skin Assessment: Skin Integrity Issues: Skin Integrity Issues:: DTI DTI: high Left; Medial Deep Tissue Pressure Injury  Last BM:  3/27  Height:   Ht Readings from Last 1 Encounters:  09/21/23 6' (1.829 m)    Weight:   Wt Readings from Last 1 Encounters:  09/22/23 51.1 kg    Ideal Body Weight:  80.9 kg  BMI:  Body mass index is 15.28 kg/m.  Estimated Nutritional Needs:   Kcal:  2200-2600  Protein:  100-125  Fluid:  >2L  Kathrynn Speed, MPH, RD, LDN Clinical Dietitian Contact information can be found at [name].

## 2023-09-22 NOTE — Progress Notes (Signed)
 Pharmacy Antibiotic Note  [name] [name] is a 89 y.o. male admitted on 09/20/2023 with loss of consciousness and concerns for aspiration pneumonia.  Pharmacy has been consulted for Unasyn dosing.  CTA finding multifocal pneumonia with debris noted within the left lower bronchials. WBC up to 14, afeb, Scr up from 1 to 1.36. On day #2 of unasyn.   Plan: Unasyn 3g IV every 12 hours  Azithromycin 500mg  IV every 24 hours per MD Monitor renal function  Follow up signs of clinical improvement, LOT, de-escalation of antibiotics   Height: 6' (182.9 cm) Weight: 51.1 kg (112 lb 10.5 oz) IBW/kg (Calculated) : 77.6 Temp (24hrs), Avg:97.8 F (36.6 C), Min:96.6 F (35.9 C), Max:98.6 F (37 C)  Recent Labs  Lab 09/20/23 1733 09/20/23 1746 09/20/23 1932 09/20/23 2330 09/20/23 2338 09/21/23 0526 09/22/23 0251  WBC 6.5  --   --   --   --  6.8 14.1*  CREATININE 1.26*  --   --   --  1.11 1.07 1.36*  LATICACIDVEN  --  2.1* 1.8 1.2  --  1.2  --     Estimated Creatinine Clearance: 27.1 mL/min (A) (by C-G formula based on SCr of 1.36 mg/dL (H)).    Allergies  Allergen Reactions   Pork-Derived Products Other (See Comments)    Religious Reasons    Antimicrobials this admission: Unasyn 3/27 >>  Azithromycin 3/27 >>   Microbiology results: 3/26 BCx: ngtd 3/27 Resp PCR: neg   Thank you for allowing pharmacy to participate in this patient's care,  Sherron Monday, PharmD, BCCCP Clinical Pharmacist  Phone: [phone] 09/22/2023 1:44 PM  Please check AMION for all [name] Pharmacy phone numbers After 10:00 PM, call Main Pharmacy [phone]

## 2023-09-22 NOTE — Plan of Care (Signed)
     Referral received for [name]: goals of care discussion. Chart reviewed. Patient assessed and is unable to engage appropriately in discussions.  I was able to speak with the patient's son Chrsitopher Wik. GOC meeting scheduled for 09/23/2023 @ 0800. Family is aware we will meet at patient's bedside.   Detailed note and recommendations to follow once GOC has been completed.   Thank you for your referral and allowing PMT to assist in [place] Steines's care.   Sarina Ser, NP Palliative Medicine Team  Team Phone # [phone]   NO CHARGE

## 2023-09-22 NOTE — Progress Notes (Signed)
 Physical Therapy Treatment Patient Details Name: [name] [name] MRN: [id number] DOB: Feb 17, 1934 Today's Date: 09/22/2023   History of Present Illness Pt is an 89 y.o. male admitted from home 3/26 with PNA and FTT. PMH: dementia, eczema,  HTN, hypothyroidism, BPH, normocytic anemia, gout, venous insufficiency, dysphagia with PEG placement 03/2022   PT Comments  Pt seen in conjunction with OT d/t cognitive status and level of physical assistance required with functional activity. Pt required totalA throughout session. He was transferred to sizewise mattress via maximove and rolled R/L dependently. Will continue to follow acutely and advance appropriately.      If plan is discharge home, recommend the following: Two people to help with walking and/or transfers;Two people to help with bathing/dressing/bathroom;Help with stairs or ramp for entrance;Assist for transportation;Direct supervision/assist for medications management;Assistance with feeding;Supervision due to cognitive status;Direct supervision/assist for financial management;Assistance with cooking/housework   Can travel by private vehicle     No  Equipment Recommendations  Wheelchair (measurements PT);Wheelchair cushion (measurements PT);Hospital bed;Hoyer lift    Recommendations for Other Services       Precautions / Restrictions Precautions Precautions: Fall Recall of Precautions/Restrictions: Impaired Precaution/Restrictions Comments: PEG, decub ulcer L hip Restrictions Weight Bearing Restrictions Per Provider Order: No     Mobility  Bed Mobility Overal bed mobility: Needs Assistance Bed Mobility: Rolling Rolling: Total assist         General bed mobility comments: Pt rolled R/L multiple times with 2+ assistance.    Transfers Overall transfer level: Needs assistance Equipment used: Ambulation equipment used               General transfer comment: Transferred pt from current hospital bed to sizewise bed using the  maximove and 2+ assistance. Transfer via Lift Equipment: Maximove  Ambulation/Gait                   Stairs             Wheelchair Mobility     Tilt Bed    Modified Rankin (Stroke Patients Only)       Balance Overall balance assessment: Needs assistance     Sitting balance - Comments: Pt is totalA, required maximove to transfer.                                    Communication Communication Communication: Impaired Factors Affecting Communication: Reduced clarity of speech;Difficulty expressing self  Cognition Arousal: Obtunded Behavior During Therapy: Flat affect   PT - Cognitive impairments: History of cognitive impairments (Dementia)                         Following commands: Impaired Following commands impaired:  (No command following observed)    Cueing Cueing Techniques: Verbal cues, Gestural cues, Tactile cues, Visual cues  Exercises      General Comments General comments (skin integrity, edema, etc.): VSS on RA.      Pertinent Vitals/Pain Pain Assessment Pain Assessment: PAINAD Breathing: normal Negative Vocalization: occasional moan/groan, low speech, negative/disapproving quality Facial Expression: smiling or inexpressive Body Language: rigid, fists clenched, knees up, pushing/pulling away, strikes out Consolability: no need to console PAINAD Score: 3 Pain Location: generalized discomfort Pain Descriptors / Indicators: Moaning Pain Intervention(s): Monitored during session, Repositioned    Home Living Family/patient expects to be discharged to:: Private residence Living Arrangements: Children;Other relatives Available Help at Discharge: Family;Available 24 hours/day Type  of Home: House Home Access: Stairs to enter Entrance Stairs-Rails: Doctor, general practice of Steps: 3   Home Layout: One level Home Equipment: Agricultural consultant (2 wheels);Cane - single point Additional Comments: No family  available. Home set up taken from chart (03/2023 admission).    Prior Function            PT Goals (current goals can now be found in the care plan section) Acute Rehab PT Goals Patient Stated Goal: Unable to state Progress towards PT goals: Not progressing toward goals - comment (Pt totalA and unable to participate in treatment session.)    Frequency    Min 1X/week      PT Plan      Co-evaluation PT/OT/SLP Co-Evaluation/Treatment: Yes Reason for Co-Treatment: Necessary to address cognition/behavior during functional activity;For patient/therapist safety PT goals addressed during session: Mobility/safety with mobility OT goals addressed during session: Strengthening/ROM      AM-PAC PT "6 Clicks" Mobility   Outcome Measure  Help needed turning from your back to your side while in a flat bed without using bedrails?: Total Help needed moving from lying on your back to sitting on the side of a flat bed without using bedrails?: Total Help needed moving to and from a bed to a chair (including a wheelchair)?: Total Help needed standing up from a chair using your arms (e.g., wheelchair or bedside chair)?: Total Help needed to walk in hospital room?: Total Help needed climbing 3-5 steps with a railing? : Total 6 Click Score: 6    End of Session   Activity Tolerance: Patient limited by lethargy;Patient limited by pain Patient left: in bed;with call bell/phone within reach Nurse Communication: Mobility status;Need for lift equipment PT Visit Diagnosis: Other abnormalities of gait and mobility (R26.89);Muscle weakness (generalized) (M62.81)     Time: 1040-1110 PT Time Calculation (min) (ACUTE ONLY): 30 min  Charges:    $Therapeutic Activity: 8-22 mins PT General Charges $$ ACUTE PT VISIT: 1 Visit                     Cheri Guppy, PT, DPT Acute Rehabilitation Services Office: [phone] Secure Chat Preferred  Richardson Chiquito 09/22/2023, 12:56 PM

## 2023-09-22 NOTE — Evaluation (Signed)
 Occupational Therapy Evaluation and Discharge Patient Details Name: [name] [name] MRN: [id number] DOB: 1934/06/05 Today's Date: 09/22/2023   History of Present Illness   Pt is an 89 y.o. male admitted from home 3/26 with PNA and FTT. PMH: dementia, eczema,  HTN, hypothyroidism, BPH, normocytic anemia, gout, venous insufficiency, dysphagia with PEG placement 03/2022     Clinical Impressions Pt is dependent in all ADLs and mobility and has been bedbound at home. Maximove used to transfer to air mattress. No OT needs.      If plan is discharge home, recommend the following:   Two people to help with walking and/or transfers;Two people to help with bathing/dressing/bathroom;Assistance with cooking/housework;Assistance with feeding;Direct supervision/assist for medications management;Direct supervision/assist for financial management;Assist for transportation;Help with stairs or ramp for entrance     Functional Status Assessment   Patient has had a recent decline in their functional status and/or demonstrates limited ability to make significant improvements in function in a reasonable and predictable amount of time     Equipment Recommendations   Hospital bed (with mattress overlay)     Recommendations for Other Services         Precautions/Restrictions   Precautions Precautions: Fall Precaution/Restrictions Comments: PEG, decub ulcer L hip Restrictions Weight Bearing Restrictions Per Provider Order: No     Mobility Bed Mobility Overal bed mobility: Needs Assistance Bed Mobility: Rolling Rolling: Total assist              Transfers Overall transfer level: Needs assistance Equipment used: Ambulation equipment used               General transfer comment: maximove to sizewise bed      Balance                                           ADL either performed or assessed with clinical judgement   ADL Overall ADL's : At baseline                                              Vision Patient Visual Report: No change from baseline       Perception         Praxis         Pertinent Vitals/Pain Pain Assessment Pain Assessment: Faces Faces Pain Scale: Hurts a little bit Pain Location: generalized discomfort Pain Descriptors / Indicators: Moaning Pain Intervention(s): Repositioned, Monitored during session     Extremity/Trunk Assessment Upper Extremity Assessment Upper Extremity Assessment: Generalized weakness   Lower Extremity Assessment Lower Extremity Assessment: Defer to PT evaluation       Communication Communication Communication: Impaired Factors Affecting Communication: Non - English speaking, interpreter not available;Reduced clarity of speech   Cognition Arousal: Obtunded Behavior During Therapy: Flat affect Cognition: History of cognitive impairments                               Following commands: Impaired       Cueing  General Comments          Exercises     Shoulder Instructions      Home Living Family/patient expects to be discharged to:: Private residence Living Arrangements: Children;Other relatives Available Help at Discharge:  Family;Available 24 hours/day Type of Home: House Home Access: Stairs to enter [employer] of Steps: 3 Entrance Stairs-Rails: Right;Left Home Layout: One level     Bathroom Shower/Tub: Producer, television/film/video: Standard     Home Equipment: Agricultural consultant (2 wheels);Cane - single point   Additional Comments: No family available. Home set up taken from chart (03/2023 admission).      Prior Functioning/Environment Prior Level of Function : Patient poor historian/Family not available;Needs assist             Mobility Comments: As of 03/2023 PT eval, pt amb with cane. Per chart, pt now bedbound. ADLs Comments: As of 03/2023 PT eval, pt mod I toileting and dressing. Family assisted with  showering. Per chart now, pt total assist and incontinent.    OT Problem List:     OT Treatment/Interventions:        OT Goals(Current goals can be found in the care plan section)       OT Frequency:       Co-evaluation PT/OT/SLP Co-Evaluation/Treatment: Yes Reason for Co-Treatment: Necessary to address cognition/behavior during functional activity;For patient/therapist safety   OT goals addressed during session: Strengthening/ROM      AM-PAC OT "6 Clicks" Daily Activity     Outcome Measure Help from another person eating meals?: Total Help from another person taking care of personal grooming?: Total Help from another person toileting, which includes using toliet, bedpan, or urinal?: Total Help from another person bathing (including washing, rinsing, drying)?: Total Help from another person to put on and taking off regular upper body clothing?: Total Help from another person to put on and taking off regular lower body clothing?: Total 6 Click Score: 6   End of Session    Activity Tolerance: Patient tolerated treatment well Patient left: in bed;with call bell/phone within reach;with nursing/sitter in room  OT Visit Diagnosis: Muscle weakness (generalized) (M62.81);Other symptoms and signs involving cognitive function                Time: 1036-1110 OT Time Calculation (min): 34 min Charges:  OT General Charges $OT Visit: 1 Visit OT Evaluation $OT Eval Moderate Complexity: 1 Mod  Berna Spare, OTR/L Acute Rehabilitation Services Office: [phone]  Evern Bio 09/22/2023, 12:12 PM

## 2023-09-22 NOTE — Plan of Care (Signed)
  Problem: Education: Goal: Knowledge of General Education information will improve Description: Including pain rating scale, medication(s)/side effects and non-pharmacologic comfort measures Outcome: Progressing   Problem: Clinical Measurements: Goal: Will remain free from infection Outcome: Progressing   Problem: Clinical Measurements: Goal: Diagnostic test results will improve Outcome: Progressing   Problem: Clinical Measurements: Goal: Respiratory complications will improve Outcome: Progressing   Problem: Clinical Measurements: Goal: Cardiovascular complication will be avoided Outcome: Progressing   Problem: Activity: Goal: Risk for activity intolerance will decrease Outcome: Progressing   Problem: Nutrition: Goal: Adequate nutrition will be maintained Outcome: Progressing

## 2023-09-23 ENCOUNTER — Inpatient Hospital Stay (HOSPITAL_COMMUNITY): Payer: Self-pay

## 2023-09-23 DIAGNOSIS — R55 Syncope and collapse: Secondary | ICD-10-CM

## 2023-09-23 DIAGNOSIS — Z515 Encounter for palliative care: Secondary | ICD-10-CM

## 2023-09-23 DIAGNOSIS — Z7189 Other specified counseling: Secondary | ICD-10-CM

## 2023-09-23 DIAGNOSIS — R7989 Other specified abnormal findings of blood chemistry: Secondary | ICD-10-CM

## 2023-09-23 LAB — CBC
HCT: 25.7 % — ABNORMAL LOW (ref 39.0–52.0)
Hemoglobin: 7.6 g/dL — ABNORMAL LOW (ref 13.0–17.0)
MCH: 25.9 pg — ABNORMAL LOW (ref 26.0–34.0)
MCHC: 29.6 g/dL — ABNORMAL LOW (ref 30.0–36.0)
MCV: 87.4 fL (ref 80.0–100.0)
Platelets: 247 10*3/uL (ref 150–400)
RBC: 2.94 MIL/uL — ABNORMAL LOW (ref 4.22–5.81)
RDW: 16.8 % — ABNORMAL HIGH (ref 11.5–15.5)
WBC: 15 10*3/uL — ABNORMAL HIGH (ref 4.0–10.5)
nRBC: 0 % (ref 0.0–0.2)

## 2023-09-23 LAB — BASIC METABOLIC PANEL WITH GFR
Anion gap: 6 (ref 5–15)
BUN: 57 mg/dL — ABNORMAL HIGH (ref 8–23)
CO2: 24 mmol/L (ref 22–32)
Calcium: 8.9 mg/dL (ref 8.9–10.3)
Chloride: 114 mmol/L — ABNORMAL HIGH (ref 98–111)
Creatinine, Ser: 1.36 mg/dL — ABNORMAL HIGH (ref 0.61–1.24)
GFR, Estimated: 50 mL/min — ABNORMAL LOW (ref >60)
Glucose, Bld: 121 mg/dL — ABNORMAL HIGH (ref 70–99)
Potassium: 4.4 mmol/L (ref 3.5–5.1)
Sodium: 144 mmol/L (ref 135–145)

## 2023-09-23 LAB — ECHOCARDIOGRAM COMPLETE
Area-P 1/2: 2.75 cm2
Height: 72 in
S' Lateral: 2.6 cm
Weight: 1802.48 [oz_av]

## 2023-09-23 LAB — PHOSPHORUS: Phosphorus: 3.7 mg/dL (ref 2.5–4.6)

## 2023-09-23 LAB — MAGNESIUM: Magnesium: 2.2 mg/dL (ref 1.7–2.4)

## 2023-09-23 MED ORDER — DEXTROSE-SODIUM CHLORIDE 5-0.45 % IV SOLN: INTRAVENOUS | Status: DC

## 2023-09-23 NOTE — Progress Notes (Signed)
 Pt had some residual and coughing. Tube feeding not advanced. Day shift notified in report, and will continue to monitor.

## 2023-09-23 NOTE — Progress Notes (Signed)
  Echocardiogram 2D Echocardiogram has been performed.  Leda Roys RDCS 09/23/2023, 12:21 PM

## 2023-09-23 NOTE — Consult Note (Signed)
 Palliative Care Consult Note                                  Date: 09/23/2023   Patient Name: [name] [name]  DOB: 01-Nov-1933  MRN: [id number]  Age / Sex: 89 y.o., male  PCP: Marcine Matar, MD Referring Physician: Narda Bonds, MD  Reason for Consultation: Establishing goals of care  HPI/Patient Profile: 89 y.o. male  with past medical history of dementia, hypertension, hypothyroidism, BPH, anemia, gout, venous insufficiency, and dysphagia status post PEG tube placement admitted on 09/20/2023 with syncope and failure to thrive.   Syncope workup initiated.  Patient was found to have evidence of multifocal pneumonia started on empiric antibiotics.    Past Medical History:  Diagnosis Date   Back pain 10/2008   per MRI 11/06/2008 - severe spinal stenosis worse at L4-5, L3-4, with neural compression at those levels   BPH (benign prostatic hyperplasia)    Eczema    Gout    Hypertension    Hypothyroidism     Subjective:   I have reviewed medical records including EPIC notes, labs and imaging, received update from Dr. Caleb Popp, assessed the patient and then met at the patient's bedside with the patient's son Kemo Spruce to discuss diagnosis prognosis, GOC, EOL wishes, disposition and options.  I introduced Palliative Medicine as specialized medical care for people living with serious illness. It focuses on providing relief from symptoms and stress of a serious illness. The goal is to improve quality of life for both the patient and the family.  Today's Discussion: Patient sleeping in NAD with son at bedside. Most of background information given by son but patient participated at times. Son shares his understanding of the patient's chronic conditions and acute hospitalization. Godfrey Pick does not see dementia as a terminal disease. He shares that he believes his father's main concern at this time is his thyroid and pneumonia. We discuss the  patient's dementia, dysphagia, and failure to thrive. We discussed the continued risk for aspiration pneumonia due to dysphagia.   Prior to this hospitalization the patient and his wife of 55 years lived with their son Godfrey Pick. 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 Four weeks ago the patient was able to go to the bathroom independently. Over the last four weeks he has been increasingly weaker with decreased mobility and function. He was no longer able to go to the bathroom independently, was fainting, and had fallen. He has a PEG for all nutrition and medications. His family provides all care for him at home.  A discussion was had today regarding advanced directives. We discussed code status and scopes of care. The difference between an aggressive medical intervention path and a palliative comfort care path for this patient was discussed. They would like to continue with full code and full scope of care. We discuss disposition options including SNF. When I mention SNF as an option [name] did not want to think about any scenario where he could not take care of his father. Godfrey Pick would like to take  his father home at discharge. I asked if the family were able to continue taking care of the patient even in his current poor functional status and Godfrey Pick said they could. He does have concerns regarding getting a hospital bed and the cost of tube feeds--- I shared these with CM.   Discussed the importance of continued conversation with family and the medical providers regarding overall plan of care and treatment options, ensuring decisions are within the context of the patient's values and GOCs.  Questions and concerns were addressed. Hard Choices booklet left for review. The family was encouraged to call with questions or concerns. PMT will continue to support holistically.  Review of Systems  Unable to perform ROS Neurological:   Positive for syncope.    Objective:   Primary Diagnoses: Present on Admission:  Anemia, chronic disease  Elevated troponin  Essential hypertension  Hypothyroidism  Multifocal pneumonia  Protein-calorie malnutrition, severe  Failure to thrive in adult  Dehydration  Dementia (HCC)  Aspiration pneumonia (HCC)  Decubitus skin ulcer  Hyperkalemia   Physical Exam Vitals reviewed.  Constitutional:      General: He is sleeping.     Appearance: He is cachectic.  Neurological:     Mental Status: He is easily aroused.    Vital Signs:  BP 128/67 (BP Location: Right Arm)   Pulse 85   Temp 98 F (36.7 C) (Oral)   Resp 19   Ht 6' (1.829 m)   Wt 51.1 kg   SpO2 100%   BMI 15.28 kg/m     Advanced Care Planning:   Existing Vynca/ACP Documentation: None  Primary Decision Maker: PATIENT. If patient is unable to make decisions his son Godfrey Pick would make decisions for him.  Code Status/Advance Care Planning: Full code   Assessment & Plan:   SUMMARY OF RECOMMENDATIONS   Full code Full scope Discharge home with family- notified CM about son's concern re: hospital bed and assistance with tube feed cost PMT support as needed    Discussed with: Dr. Caleb Popp and CM Eunice Blase Swist  Time Total: 75 minutes    Thank you for allowing [place] to participate in the care of Firman Petrow PMT will continue to support holistically.   Signed by: Sarina Ser, NP Palliative Medicine Team  Team Phone # [phone] (Nights/Weekends)  09/23/2023, 8:43 AM

## 2023-09-23 NOTE — Progress Notes (Signed)
 PROGRESS NOTE    [name] [name]  NWG:[id number] DOB: 08/10/1933 DOA: 09/20/2023 PCP: Marcine Matar, MD   Brief Narrative: [name] [name] is a 89 y.o. male with a history of dementia, hypertension, hypothyroidism, BPH, anemia, gout, venous insufficiency, dysphagia status post PEG tube placement.  Patient presented secondary to loss of consciousness and failure to thrive.  Syncope workup initiated.  Patient was found to have evidence of multifocal pneumonia started on empiric antibiotics.   Assessment/Plan:  Syncope Unclear etiology. Troponin elevated with negative delta. No chest pain. EKG non-acute. Transthoracic Echocardiogram ordered and pending. CT head imaging non-acute.  Multifocal pneumonia Possible aspiration etiology. Patient initially given Ceftriaxone/Azithromycin and transitioned to Unasyn and azithromycin. No leukocytosis. Procalcitonin is undetectable. COVID-19, RSV and influenza negative. Recommendation for repeat CT scan in 3 months to evaluate for resolution and exclude underlying masses. Blood cultures no growth to date. -Continue Unasyn IV and azithromycin -Flutter valve/incentive spirometer if able  Failure to thrive -Palliative care consult -PT/OT  Primary hypertension Patient is listed as taking metoprolol tartrate, but however is not taking as an outpatient.  Blood pressure is uncontrolled. -Continue amlodipine 5 mg daily.  BPH Noted. Not on medication therapy as an outpatient.  Elevated creatinine Baseline creatinine of about 1-1.1. Secondary to poor fluid intake. Creatinine up to 1.36. -Continue D5 IV fluids -Free water per PEG tube  PEG tube dependence Dysphagia Noted.  Patient is on T-piece as outpatient.  Tube feeds held currently. Tube confirmed in good position on CT imaging.  Speech therapy consulted. -SLP recommendations (3/27): Aspiration Risk Severe aspiration risk;Risk for inadequate nutrition/hydration  Diet Recommendation NPO;Alternative  means - long-term   Medication Administration:  Via alternative means  Other  Recommendations Recommended Consults:  (Consider palliative consult) Oral Care Recommendations: Oral care QID    -Dietitian recommendations (3/28): Initiate tube feeding via PEG: Jevity 1.5 at 60 ml/h (1440 ml per day) Prosource TF20 60 ml BID. This regimen provides: 2320 kcal, 110g protein, free water  TF regimen reccomendation at d/c: 1.5 cartons Jevity 1.5 QID + 1 carton Jevity 1.5 once daily (7 cartons daily) 30ml free water flushes before and after each bolus ( total) Prosource 60 mL daily. This provides 80 kcal and 20g of protein per packet This regimen provides: 2565 kcal, 126g protein, free water  Hypothyroidism -Continue levothyroxine 75 mcg daily per tube  Severe malnutrition Patient is currently PEG tube dependent.  Dietitian consulted on admission.  Constipation Rectal stool ball noted on CT imaging measuring 7 cm. -Dulcolax suppository; if fails, SMOG enema  Dementia Noted. -Delirium precautions  Pressure injury Noted on admission.  Wound care consulted.  Hyperkalemia Potassium of 5.3 on admission with peak of 6.2.  Initially resolved with fluid, now recurrent. Resolved.  Hypernatremia Likely related to dehydration from no oral intake and poor intake via PEG tube.  Sodium as high as 147. Resolved while on IV fluids  Aortic atherosclerosis Noted on CT imaging  Retractile bilateral testis Noted on CT imaging. Will need to correlate clinically   DVT prophylaxis: SCDs Code Status:   Code Status: Full Code Family Communication: None at bedside.  Called son via telephone (3/29) but no response. Disposition Plan: Discharge home when medically stable. Medically stable for discharge in 48 hours if electrolytes and renal function improved/stable.   Consultants:  Palliative care medicine  Procedures:  None  Antimicrobials: Ceftriaxone Azithromycin Unasyn     Subjective: No issues noted from overnight.  Objective: BP 121/73   Pulse  90   Temp 97.6 F (36.4 C) (Oral)   Resp (!) 21   Ht 6' (1.829 m)   Wt 51.1 kg   SpO2 99%   BMI 15.28 kg/m   Examination:  General exam: Appears calm and comfortable. Cachectic. Respiratory system: Clear to auscultation. Respiratory effort normal. Cardiovascular system: S1 & S2 heard, RRR. Gastrointestinal system: Abdomen is nondistended, soft and nontender. Normal bowel sounds heard. Central nervous system: Alert. Musculoskeletal: No edema. No calf tenderness   Data Reviewed: I have personally reviewed following labs and imaging studies   Last CBC Lab Results  Component Value Date   WBC 15.0 (H) 09/23/2023   HGB 7.6 (L) 09/23/2023   HCT 25.7 (L) 09/23/2023   MCV 87.4 09/23/2023   MCH 25.9 (L) 09/23/2023   RDW 16.8 (H) 09/23/2023   PLT 247 09/23/2023     Last metabolic panel Lab Results  Component Value Date   GLUCOSE 121 (H) 09/23/2023   NA 144 09/23/2023   K 4.4 09/23/2023   CL 114 (H) 09/23/2023   CO2 24 09/23/2023   BUN 57 (H) 09/23/2023   CREATININE 1.36 (H) 09/23/2023   GFRNONAA 50 (L) 09/23/2023   CALCIUM 8.9 09/23/2023   PHOS 3.7 09/23/2023   PROT 7.4 09/21/2023   ALBUMIN 2.2 (L) 09/21/2023   LABGLOB 3.1 04/10/2023   AGRATIO 1.1 (L) 03/10/2022   BILITOT 0.4 09/21/2023   ALKPHOS 57 09/21/2023   AST 24 09/21/2023   ALT 18 09/21/2023   ANIONGAP 6 09/23/2023     Creatinine Clearance: Estimated Creatinine Clearance: 27.1 mL/min (A) (by C-G formula based on SCr of 1.36 mg/dL (H)).  Recent Results (from the past 240 hours)  Culture, blood (Routine X 2) w Reflex to ID Panel     Status: None (Preliminary result)   Collection Time: 09/20/23  5:33 PM   Specimen: BLOOD RIGHT ARM  Result Value Ref Range Status   Specimen Description BLOOD RIGHT ARM  Final   Special Requests   Final    BOTTLES DRAWN AEROBIC AND ANAEROBIC Blood Culture results may not be optimal due to an  inadequate volume of blood received in culture bottles   Culture   Final    NO GROWTH 3 DAYS Performed at [facility], [address] [place], [place] [address]    Report Status PENDING  Incomplete  Culture, blood (Routine X 2) w Reflex to ID Panel     Status: None (Preliminary result)   Collection Time: 09/20/23  5:34 PM   Specimen: BLOOD  Result Value Ref Range Status   Specimen Description BLOOD SITE NOT SPECIFIED  Final   Special Requests   Final    BOTTLES DRAWN AEROBIC AND ANAEROBIC Blood Culture results may not be optimal due to an inadequate volume of blood received in culture bottles   Culture   Final    NO GROWTH 3 DAYS Performed at [name], [address], [place], [place] [address]    Report Status PENDING  Incomplete  Resp panel by RT-PCR (RSV, Flu A&B, Covid) Anterior Nasal Swab     Status: None   Collection Time: 09/20/23 11:38 PM   Specimen: Anterior Nasal Swab  Result Value Ref Range Status   SARS Coronavirus 2 by RT PCR NEGATIVE NEGATIVE Final   Influenza A by PCR NEGATIVE NEGATIVE Final   Influenza B by PCR NEGATIVE NEGATIVE Final    Comment: (NOTE) The Xpert Xpress SARS-CoV-2/FLU/RSV plus assay is intended as  an aid in the diagnosis of influenza from Nasopharyngeal swab specimens and should not be used as a sole basis for treatment. Nasal washings and aspirates are unacceptable for Xpert Xpress SARS-CoV-2/FLU/RSV testing.  Fact Sheet for Patients: BloggerCourse.com  Fact Sheet for Healthcare Providers: SeriousBroker.it  This test is not yet approved or cleared by the [place] FDA and has been authorized for detection and/or diagnosis of SARS-CoV-2 by FDA under an Emergency Use Authorization (EUA). This EUA will remain in effect (meaning this test can be used) for the duration of the COVID-19 declaration under Section 564(b)(1) of the Act, 21 U.S.C. section 360bbb-3(b)(1), unless the  authorization is terminated or revoked.     Resp Syncytial Virus by PCR NEGATIVE NEGATIVE Final    Comment: (NOTE) Fact Sheet for Patients: BloggerCourse.com  Fact Sheet for Healthcare Providers: SeriousBroker.it  This test is not yet approved or cleared by the [place] FDA and has been authorized for detection and/or diagnosis of SARS-CoV-2 by FDA under an Emergency Use Authorization (EUA). This EUA will remain in effect (meaning this test can be used) for the duration of the COVID-19 declaration under Section 564(b)(1) of the Act, 21 U.S.C. section 360bbb-3(b)(1), unless the authorization is terminated or revoked.  Performed at [facility], [address], [place], [place] [address]   Respiratory (~20 pathogens) panel by PCR     Status: None   Collection Time: 09/21/23  8:41 AM   Specimen: Nasopharyngeal Swab; Respiratory  Result Value Ref Range Status   Adenovirus NOT DETECTED NOT DETECTED Final   Coronavirus 229E NOT DETECTED NOT DETECTED Final    Comment: (NOTE) The Coronavirus on the Respiratory Panel, DOES NOT test for the novel  Coronavirus (2019 nCoV)    Coronavirus HKU1 NOT DETECTED NOT DETECTED Final   Coronavirus NL63 NOT DETECTED NOT DETECTED Final   Coronavirus OC43 NOT DETECTED NOT DETECTED Final   Metapneumovirus NOT DETECTED NOT DETECTED Final   Rhinovirus / Enterovirus NOT DETECTED NOT DETECTED Final   Influenza A NOT DETECTED NOT DETECTED Final   Influenza B NOT DETECTED NOT DETECTED Final   Parainfluenza Virus 1 NOT DETECTED NOT DETECTED Final   Parainfluenza Virus 2 NOT DETECTED NOT DETECTED Final   Parainfluenza Virus 3 NOT DETECTED NOT DETECTED Final   Parainfluenza Virus 4 NOT DETECTED NOT DETECTED Final   Respiratory Syncytial Virus NOT DETECTED NOT DETECTED Final   Bordetella pertussis NOT DETECTED NOT DETECTED Final   Bordetella Parapertussis NOT DETECTED NOT DETECTED Final    Chlamydophila pneumoniae NOT DETECTED NOT DETECTED Final   Mycoplasma pneumoniae NOT DETECTED NOT DETECTED Final    Comment: Performed at [facility], [address], [place], [place] [address]      Radiology Studies: ECHOCARDIOGRAM COMPLETE Result Date: 09/23/2023    ECHOCARDIOGRAM REPORT   Patient Name:   [name] [name]  Date of Exam: 09/23/2023 Medical Rec #:  [id number]  Height:       72.0 in Accession #:    [id number] Weight:       112.7 lb Date of Birth:  01-06-1934   BSA:          1.669 m Patient Age:    89 years   BP:           121/73 mmHg Patient Gender: M          HR:           65 bpm. Exam Location:  Inpatient Procedure: 2D Echo, Color  Doppler and Cardiac Doppler (Both Spectral and Color            Flow Doppler were utilized during procedure). Indications:    Elevated Troponin  History:        Patient has prior history of Echocardiogram examinations, most                 recent 04/17/2023.  Sonographer:    Harriette Bouillon RDCS Referring Phys: [id number] ANASTASSIA DOUTOVA IMPRESSIONS  1. Left ventricular ejection fraction, by estimation, is 60 to 65%. The left ventricle has normal function. The left ventricle has no regional wall motion abnormalities. There is mild concentric left ventricular hypertrophy. Left ventricular diastolic parameters are consistent with Grade I diastolic dysfunction (impaired relaxation).  2. Right ventricular systolic function is normal. The right ventricular size is normal.  3. The mitral valve is normal in structure. Trivial mitral valve regurgitation. No evidence of mitral stenosis.  4. The aortic valve is tricuspid. Aortic valve regurgitation is not visualized. Aortic valve sclerosis is present, with no evidence of aortic valve stenosis. IVC not well visualzied Comparison(s): No significant change from prior study. Prior images reviewed side by side. Suboptimal imaging windows in this study; patient physicaly unable to maneuver for optimization. FINDINGS  Left Ventricle: Left  ventricular ejection fraction, by estimation, is 60 to 65%. The left ventricle has normal function. The left ventricle has no regional wall motion abnormalities. Strain was performed and the global longitudinal strain is indeterminate. The left ventricular internal cavity size was small. There is mild concentric left ventricular hypertrophy. Left ventricular diastolic parameters are consistent with Grade I diastolic dysfunction (impaired relaxation). Right Ventricle: The right ventricular size is normal. No increase in right ventricular wall thickness. Right ventricular systolic function is normal. Left Atrium: Left atrial size was normal in size. Right Atrium: Right atrial size was normal in size. Pericardium: Trivial pericardial effusion is present. The pericardial effusion is posterior to the left ventricle. Mitral Valve: The mitral valve is normal in structure. Trivial mitral valve regurgitation. No evidence of mitral valve stenosis. Tricuspid Valve: The tricuspid valve is normal in structure. Tricuspid valve regurgitation is not demonstrated. No evidence of tricuspid stenosis. Aortic Valve: The aortic valve is tricuspid. Aortic valve regurgitation is not visualized. Aortic valve sclerosis is present, with no evidence of aortic valve stenosis. Pulmonic Valve: The pulmonic valve was not well visualized. Pulmonic valve regurgitation is not visualized. Aorta: The aortic root is normal in size and structure and the ascending aorta was not well visualized. IAS/Shunts: The interatrial septum was not well visualized. Additional Comments: 3D was performed not requiring image post processing on an independent workstation and was indeterminate.  LEFT VENTRICLE PLAX 2D LVIDd:         3.90 cm   Diastology LVIDs:         2.60 cm   LV e' medial:    5.77 cm/s LV PW:         1.20 cm   LV E/e' medial:  13.2 LV IVS:        1.30 cm   LV e' lateral:   6.96 cm/s LVOT diam:     2.10 cm   LV E/e' lateral: 10.9 LV SV:         77 LV SV  Index:   46 LVOT Area:     3.46 cm  RIGHT VENTRICLE RV S prime:     13.30 cm/s TAPSE (M-mode): 1.6 cm LEFT ATRIUM  Index        RIGHT ATRIUM           Index LA diam:        2.90 cm 1.74 cm/m   RA Area:     10.70 cm LA Vol (A2C):   42.9 ml 25.70 ml/m  RA Volume:   22.40 ml  13.42 ml/m LA Vol (A4C):   35.7 ml 21.39 ml/m LA Biplane Vol: 40.4 ml 24.20 ml/m  AORTIC VALVE LVOT Vmax:   113.00 cm/s LVOT Vmean:  78.200 cm/s LVOT VTI:    0.223 m  AORTA Ao Root diam: 3.00 cm MITRAL VALVE MV Area (PHT): 2.75 cm    SHUNTS MV Decel Time: 276 msec    Systemic VTI:  0.22 m MV E velocity: 76.00 cm/s  Systemic Diam: 2.10 cm MV A velocity: 81.20 cm/s MV E/A ratio:  0.94 Riley Lam MD Electronically signed by Riley Lam MD Signature Date/Time: 09/23/2023/1:03:51 PM    Final       LOS: 2 days    Jacquelin Hawking, MD [facility] 09/23/2023, 1:50 PM   If 7PM-7AM, please contact night-coverage [email or web address]

## 2023-09-23 NOTE — Plan of Care (Signed)
   Problem: Education: Goal: Knowledge of General Education information will improve Description Including pain rating scale, medication(s)/side effects and non-pharmacologic comfort measures Outcome: Progressing

## 2023-09-24 ENCOUNTER — Inpatient Hospital Stay (HOSPITAL_COMMUNITY): Payer: MEDICAID

## 2023-09-24 LAB — BASIC METABOLIC PANEL WITH GFR
Anion gap: 5 (ref 5–15)
BUN: 56 mg/dL — ABNORMAL HIGH (ref 8–23)
CO2: 26 mmol/L (ref 22–32)
Calcium: 8.7 mg/dL — ABNORMAL LOW (ref 8.9–10.3)
Chloride: 113 mmol/L — ABNORMAL HIGH (ref 98–111)
Creatinine, Ser: 1.22 mg/dL (ref 0.61–1.24)
GFR, Estimated: 57 mL/min — ABNORMAL LOW (ref >60)
Glucose, Bld: 108 mg/dL — ABNORMAL HIGH (ref 70–99)
Potassium: 4.1 mmol/L (ref 3.5–5.1)
Sodium: 144 mmol/L (ref 135–145)

## 2023-09-24 LAB — VITAMIN B1: Vitamin B1 (Thiamine): 114.9 nmol/L (ref 66.5–200.0)

## 2023-09-24 LAB — MAGNESIUM: Magnesium: 2.1 mg/dL (ref 1.7–2.4)

## 2023-09-24 LAB — LEGIONELLA PNEUMOPHILA SEROGP 1 UR AG: L. pneumophila Serogp 1 Ur Ag: NEGATIVE

## 2023-09-24 LAB — PHOSPHORUS: Phosphorus: 3.3 mg/dL (ref 2.5–4.6)

## 2023-09-24 MED ORDER — DIATRIZOATE MEGLUMINE & SODIUM 66-10 % PO SOLN
30.0000 mL | Freq: Once | ORAL | Status: AC
Start: 2023-09-24 — End: 2023-09-24
  Administered 2023-09-24: 30 mL
  Filled 2023-09-24: qty 30

## 2023-09-24 MED ORDER — DEXTROSE-SODIUM CHLORIDE 5-0.45 % IV SOLN: INTRAVENOUS | Status: AC

## 2023-09-24 NOTE — Progress Notes (Signed)
 PROGRESS NOTE    Aron Inge  IRJ:[id number] DOB: 1933/06/30 DOA: 09/20/2023 PCP: Marcine Matar, MD   Brief Narrative: [name] [name] is a 89 y.o. male with a history of dementia, hypertension, hypothyroidism, BPH, anemia, gout, venous insufficiency, dysphagia status post PEG tube placement.  Patient presented secondary to loss of consciousness and failure to thrive.  Syncope workup initiated.  Patient was found to have evidence of multifocal pneumonia started on empiric antibiotics.   Assessment/Plan:  Syncope Unclear etiology. Troponin elevated with negative delta. No chest pain. EKG non-acute. Transthoracic Echocardiogram ordered and pending. CT head imaging non-acute.  Multifocal pneumonia Possible aspiration etiology. Patient initially given Ceftriaxone/Azithromycin and transitioned to Unasyn and azithromycin. No leukocytosis. Procalcitonin is undetectable. COVID-19, RSV and influenza negative. Recommendation for repeat CT scan in 3 months to evaluate for resolution and exclude underlying masses. Blood cultures no growth to date. -Continue Unasyn IV and azithromycin -Flutter valve/incentive spirometer if able  Failure to thrive -Palliative care consult -PT/OT  Fall out of bed Unwitnessed. Patient without noted injury although son mentions that patient complained of headache; patient declines any head injury. -CT head  Primary hypertension Patient is listed as taking metoprolol tartrate, but however is not taking as an outpatient.  Blood pressure is uncontrolled. -Continue amlodipine 5 mg daily.  BPH Noted. Not on medication therapy as an outpatient.  Elevated creatinine Baseline creatinine of about 1-1.1. Secondary to poor fluid intake. Creatinine up to 1.36. -Continue D5 IV fluids -Free water per PEG tube  PEG tube dependence Dysphagia Noted.  Patient is on T-piece as outpatient.  Tube feeds held currently. Tube confirmed in good position on CT imaging.  Speech therapy  consulted. PEG tube dislodged with fall on 3/30. -X-ray to check PEG placement -SLP recommendations (3/27): Aspiration Risk Severe aspiration risk;Risk for inadequate nutrition/hydration  Diet Recommendation NPO;Alternative means - long-term   Medication Administration:  Via alternative means  Other  Recommendations Recommended Consults:  (Consider palliative consult) Oral Care Recommendations: Oral care QID    -Dietitian recommendations (3/28): Initiate tube feeding via PEG: Jevity 1.5 at 60 ml/h (1440 ml per day) Prosource TF20 60 ml BID. This regimen provides: 2320 kcal, 110g protein, free water  TF regimen reccomendation at d/c: 1.5 cartons Jevity 1.5 QID + 1 carton Jevity 1.5 once daily (7 cartons daily) 30ml free water flushes before and after each bolus ( total) Prosource 60 mL daily. This provides 80 kcal and 20g of protein per packet This regimen provides: 2565 kcal, 126g protein, free water  Hypothyroidism -Continue levothyroxine 75 mcg daily per tube  Severe malnutrition Patient is currently PEG tube dependent.  Dietitian consulted on admission.  Constipation Rectal stool ball noted on CT imaging measuring 7 cm. -Dulcolax suppository; if fails, SMOG enema  Dementia Noted. -Delirium precautions  Pressure injury Noted on admission.  Wound care consulted.  Hyperkalemia Potassium of 5.3 on admission with peak of 6.2.  Initially resolved with fluid, now recurrent. Resolved.  Hypernatremia Likely related to dehydration from no oral intake and poor intake via PEG tube.  Sodium as high as 147. Resolved while on IV fluids  Aortic atherosclerosis Noted on CT imaging  Retractile bilateral testis Noted on CT imaging. Will need to correlate clinically   DVT prophylaxis: SCDs Code Status:   Code Status: Full Code Family Communication: None at bedside.  Son via telephone Disposition Plan: Discharge home when medically stable. Medically stable  for discharge in 48 hours if electrolytes and renal function improved/stable.  Consultants:  Palliative care medicine  Procedures:  None  Antimicrobials: Ceftriaxone Azithromycin Unasyn    Subjective: No issues noted from overnight. This morning, patient was found on the ground from a presumed fall. PEG tube dislodged with suture removed and concern for malpositioning. Patient reports no injury and no headache, although son told RN that patient mentioned a headache.  Objective: BP 116/64 (BP Location: Left Arm)   Pulse 87   Temp 97.9 F (36.6 C) (Oral)   Resp 19   Ht 6' (1.829 m)   Wt 51.1 kg   SpO2 100%   BMI 15.28 kg/m   Examination:  General exam: Appears calm and comfortable. Chronically ill. Cachectic. Respiratory system: Clear to auscultation. Respiratory effort normal. Cardiovascular system: S1 & S2 heard, RRR. No murmurs. Gastrointestinal system: Abdomen is nondistended, soft and nontender. Normal bowel sounds heard. Central nervous system: Alert and oriented.   Data Reviewed: I have personally reviewed following labs and imaging studies   Last CBC Lab Results  Component Value Date   WBC 15.0 (H) 09/23/2023   HGB 7.6 (L) 09/23/2023   HCT 25.7 (L) 09/23/2023   MCV 87.4 09/23/2023   MCH 25.9 (L) 09/23/2023   RDW 16.8 (H) 09/23/2023   PLT 247 09/23/2023     Last metabolic panel Lab Results  Component Value Date   GLUCOSE 108 (H) 09/24/2023   NA 144 09/24/2023   K 4.1 09/24/2023   CL 113 (H) 09/24/2023   CO2 26 09/24/2023   BUN 56 (H) 09/24/2023   CREATININE 1.22 09/24/2023   GFRNONAA 57 (L) 09/24/2023   CALCIUM 8.7 (L) 09/24/2023   PHOS 3.3 09/24/2023   PROT 7.4 09/21/2023   ALBUMIN 2.2 (L) 09/21/2023   LABGLOB 3.1 04/10/2023   AGRATIO 1.1 (L) 03/10/2022   BILITOT 0.4 09/21/2023   ALKPHOS 57 09/21/2023   AST 24 09/21/2023   ALT 18 09/21/2023   ANIONGAP 5 09/24/2023     Creatinine Clearance: Estimated Creatinine Clearance: 30.3  mL/min (by C-G formula based on SCr of 1.22 mg/dL).  Recent Results (from the past 240 hours)  Culture, blood (Routine X 2) w Reflex to ID Panel     Status: None (Preliminary result)   Collection Time: 09/20/23  5:33 PM   Specimen: BLOOD RIGHT ARM  Result Value Ref Range Status   Specimen Description BLOOD RIGHT ARM  Final   Special Requests   Final    BOTTLES DRAWN AEROBIC AND ANAEROBIC Blood Culture results may not be optimal due to an inadequate volume of blood received in culture bottles   Culture   Final    NO GROWTH 4 DAYS Performed at [facility], [address], [place], [place] [address]    Report Status PENDING  Incomplete  Culture, blood (Routine X 2) w Reflex to ID Panel     Status: None (Preliminary result)   Collection Time: 09/20/23  5:34 PM   Specimen: BLOOD  Result Value Ref Range Status   Specimen Description BLOOD SITE NOT SPECIFIED  Final   Special Requests   Final    BOTTLES DRAWN AEROBIC AND ANAEROBIC Blood Culture results may not be optimal due to an inadequate volume of blood received in culture bottles   Culture   Final    NO GROWTH 4 DAYS Performed at [facility], [address], [place], [place] [address]    Report Status PENDING  Incomplete  Resp panel by RT-PCR (RSV, Flu A&B, Covid) Anterior Nasal  Swab     Status: None   Collection Time: 09/20/23 11:38 PM   Specimen: Anterior Nasal Swab  Result Value Ref Range Status   SARS Coronavirus 2 by RT PCR NEGATIVE NEGATIVE Final   Influenza A by PCR NEGATIVE NEGATIVE Final   Influenza B by PCR NEGATIVE NEGATIVE Final    Comment: (NOTE) The Xpert Xpress SARS-CoV-2/FLU/RSV plus assay is intended as an aid in the diagnosis of influenza from Nasopharyngeal swab specimens and should not be used as a sole basis for treatment. Nasal washings and aspirates are unacceptable for Xpert Xpress SARS-CoV-2/FLU/RSV testing.  Fact Sheet for Patients: BloggerCourse.com  Fact Sheet  for Healthcare Providers: SeriousBroker.it  This test is not yet approved or cleared by the [place] FDA and has been authorized for detection and/or diagnosis of SARS-CoV-2 by FDA under an Emergency Use Authorization (EUA). This EUA will remain in effect (meaning this test can be used) for the duration of the COVID-19 declaration under Section 564(b)(1) of the Act, 21 U.S.C. section 360bbb-3(b)(1), unless the authorization is terminated or revoked.     Resp Syncytial Virus by PCR NEGATIVE NEGATIVE Final    Comment: (NOTE) Fact Sheet for Patients: BloggerCourse.com  Fact Sheet for Healthcare Providers: SeriousBroker.it  This test is not yet approved or cleared by the [place] FDA and has been authorized for detection and/or diagnosis of SARS-CoV-2 by FDA under an Emergency Use Authorization (EUA). This EUA will remain in effect (meaning this test can be used) for the duration of the COVID-19 declaration under Section 564(b)(1) of the Act, 21 U.S.C. section 360bbb-3(b)(1), unless the authorization is terminated or revoked.  Performed at [facility], [address] [place], [place] [address]   Respiratory (~20 pathogens) panel by PCR     Status: None   Collection Time: 09/21/23  8:41 AM   Specimen: Nasopharyngeal Swab; Respiratory  Result Value Ref Range Status   Adenovirus NOT DETECTED NOT DETECTED Final   Coronavirus 229E NOT DETECTED NOT DETECTED Final    Comment: (NOTE) The Coronavirus on the Respiratory Panel, DOES NOT test for the novel  Coronavirus (2019 nCoV)    Coronavirus HKU1 NOT DETECTED NOT DETECTED Final   Coronavirus NL63 NOT DETECTED NOT DETECTED Final   Coronavirus OC43 NOT DETECTED NOT DETECTED Final   Metapneumovirus NOT DETECTED NOT DETECTED Final   Rhinovirus / Enterovirus NOT DETECTED NOT DETECTED Final   Influenza A NOT DETECTED NOT DETECTED Final   Influenza  B NOT DETECTED NOT DETECTED Final   Parainfluenza Virus 1 NOT DETECTED NOT DETECTED Final   Parainfluenza Virus 2 NOT DETECTED NOT DETECTED Final   Parainfluenza Virus 3 NOT DETECTED NOT DETECTED Final   Parainfluenza Virus 4 NOT DETECTED NOT DETECTED Final   Respiratory Syncytial Virus NOT DETECTED NOT DETECTED Final   Bordetella pertussis NOT DETECTED NOT DETECTED Final   Bordetella Parapertussis NOT DETECTED NOT DETECTED Final   Chlamydophila pneumoniae NOT DETECTED NOT DETECTED Final   Mycoplasma pneumoniae NOT DETECTED NOT DETECTED Final    Comment: Performed at [name], [address] [place], [place] [address]      Radiology Studies: ECHOCARDIOGRAM COMPLETE Result Date: 09/23/2023    ECHOCARDIOGRAM REPORT   Patient Name:   OBI SCRIMA  Date of Exam: 09/23/2023 Medical Rec #:  [id number]  Height:       72.0 in Accession #:    [id number] Weight:       112.7 lb Date of Birth:  1934-01-08  BSA:          1.669 m Patient Age:    89 years   BP:           121/73 mmHg Patient Gender: M          HR:           65 bpm. Exam Location:  Inpatient Procedure: 2D Echo, Color Doppler and Cardiac Doppler (Both Spectral and Color            Flow Doppler were utilized during procedure). Indications:    Elevated Troponin  History:        Patient has prior history of Echocardiogram examinations, most                 recent 04/17/2023.  Sonographer:    Harriette Bouillon RDCS Referring Phys: [id number] ANASTASSIA DOUTOVA IMPRESSIONS  1. Left ventricular ejection fraction, by estimation, is 60 to 65%. The left ventricle has normal function. The left ventricle has no regional wall motion abnormalities. There is mild concentric left ventricular hypertrophy. Left ventricular diastolic parameters are consistent with Grade I diastolic dysfunction (impaired relaxation).  2. Right ventricular systolic function is normal. The right ventricular size is normal.  3. The mitral valve is normal in structure. Trivial mitral valve  regurgitation. No evidence of mitral stenosis.  4. The aortic valve is tricuspid. Aortic valve regurgitation is not visualized. Aortic valve sclerosis is present, with no evidence of aortic valve stenosis. IVC not well visualzied Comparison(s): No significant change from prior study. Prior images reviewed side by side. Suboptimal imaging windows in this study; patient physicaly unable to maneuver for optimization. FINDINGS  Left Ventricle: Left ventricular ejection fraction, by estimation, is 60 to 65%. The left ventricle has normal function. The left ventricle has no regional wall motion abnormalities. Strain was performed and the global longitudinal strain is indeterminate. The left ventricular internal cavity size was small. There is mild concentric left ventricular hypertrophy. Left ventricular diastolic parameters are consistent with Grade I diastolic dysfunction (impaired relaxation). Right Ventricle: The right ventricular size is normal. No increase in right ventricular wall thickness. Right ventricular systolic function is normal. Left Atrium: Left atrial size was normal in size. Right Atrium: Right atrial size was normal in size. Pericardium: Trivial pericardial effusion is present. The pericardial effusion is posterior to the left ventricle. Mitral Valve: The mitral valve is normal in structure. Trivial mitral valve regurgitation. No evidence of mitral valve stenosis. Tricuspid Valve: The tricuspid valve is normal in structure. Tricuspid valve regurgitation is not demonstrated. No evidence of tricuspid stenosis. Aortic Valve: The aortic valve is tricuspid. Aortic valve regurgitation is not visualized. Aortic valve sclerosis is present, with no evidence of aortic valve stenosis. Pulmonic Valve: The pulmonic valve was not well visualized. Pulmonic valve regurgitation is not visualized. Aorta: The aortic root is normal in size and structure and the ascending aorta was not well visualized. IAS/Shunts: The  interatrial septum was not well visualized. Additional Comments: 3D was performed not requiring image post processing on an independent workstation and was indeterminate.  LEFT VENTRICLE PLAX 2D LVIDd:         3.90 cm   Diastology LVIDs:         2.60 cm   LV e' medial:    5.77 cm/s LV PW:         1.20 cm   LV E/e' medial:  13.2 LV IVS:        1.30 cm   LV e'  lateral:   6.96 cm/s LVOT diam:     2.10 cm   LV E/e' lateral: 10.9 LV SV:         77 LV SV Index:   46 LVOT Area:     3.46 cm  RIGHT VENTRICLE RV S prime:     13.30 cm/s TAPSE (M-mode): 1.6 cm LEFT ATRIUM             Index        RIGHT ATRIUM           Index LA diam:        2.90 cm 1.74 cm/m   RA Area:     10.70 cm LA Vol (A2C):   42.9 ml 25.70 ml/m  RA Volume:   22.40 ml  13.42 ml/m LA Vol (A4C):   35.7 ml 21.39 ml/m LA Biplane Vol: 40.4 ml 24.20 ml/m  AORTIC VALVE LVOT Vmax:   113.00 cm/s LVOT Vmean:  78.200 cm/s LVOT VTI:    0.223 m  AORTA Ao Root diam: 3.00 cm MITRAL VALVE MV Area (PHT): 2.75 cm    SHUNTS MV Decel Time: 276 msec    Systemic VTI:  0.22 m MV E velocity: 76.00 cm/s  Systemic Diam: 2.10 cm MV A velocity: 81.20 cm/s MV E/A ratio:  0.94 Riley Lam MD Electronically signed by Riley Lam MD Signature Date/Time: 09/23/2023/1:03:51 PM    Final       LOS: 3 days    Jacquelin Hawking, MD [facility] 09/24/2023, 11:44 AM   If 7PM-7AM, please contact night-coverage [email or web address]

## 2023-09-24 NOTE — Plan of Care (Signed)
   Problem: Education: Goal: Knowledge of General Education information will improve Description Including pain rating scale, medication(s)/side effects and non-pharmacologic comfort measures Outcome: Progressing

## 2023-09-24 NOTE — Progress Notes (Signed)
   09/24/23 1000  What Happened  Was fall witnessed? No  Was patient injured? No  Patient found on floor  Found by Staff-comment  Stated prior activity other (comment) (slid \\out  of bed)  Provider Notification  Provider Name/Title Dr Caleb Popp  Date Provider Notified 09/24/23  Time Provider Notified 1010  Method of Notification Rounds;Face-to-face  Notification Reason Fall  Provider response At bedside  Date of Provider Response 09/24/23  Time of Provider Response 1010  Follow Up  Family notified Yes - comment  Time family notified 1008  Additional tests  (CT of head and placement of PEG xray)  Progress note created (see row info) Yes  Adult Fall Risk Assessment  Risk Factor Category (scoring not indicated) History of more than one fall within 6 months before admission (document High fall risk);Fall has occurred during this admission (document High fall risk)  Patient Fall Risk Level High fall risk  Adult Fall Risk Interventions  Required Bundle Interventions *See Row Information* High fall risk - low, moderate, and high requirements implemented  Additional Interventions Use of appropriate toileting equipment (bedpan, BSC, etc.)  Fall intervention(s) refused/Patient educated regarding refusal Nonskid socks;Open door if unsupervised  Screening for Fall Injury Risk (To be completed on HIGH fall risk patients) - Assessing Need for Floor Mats  Risk For Fall Injury- Criteria for Floor Mats 85 years or older  Will Implement Floor Mats Yes    Pt found on floor covered in stool , lines stretched across the bed  Pt unaware of how he fell, called his son who stated " He falls multiple times a day "  Pt told son the back of his head was a bit sore. CT of head ordered by Dr Caleb Popp as well as a abd xray to check placement of PEG .  Pt placed back in bed with assistance of several staff members and Dr Caleb Popp was at bedside Vital signs 116/64 P80 RR19 Spo@100 %on RA.  Pt fell almost immediately  after family left lower siderails were down and bed alarm was not on as family had been at bedside.  Neurologically pt was unchanged no skin issues noted

## 2023-09-24 NOTE — Progress Notes (Addendum)
 S/P FALL   Pt found on floor covered in stool , lines stretched across the bed  Pt unaware of how he fell, called his son who stated " He falls multiple times a day "  Pt told son the back of his head was a bit sore. CT of head ordered by Dr Caleb Popp as well as a abd xray to check placement of PEG .  Pt placed back in bed with assistance of several staff members and Dr Caleb Popp was at bedside Vital signs 116/64 P80 RR19 Spo@100 %on RA.  Pt fell almost immediately after family left lower siderails were down and bed alarm was not on as family had been at bedside.  Neurologically pt was unchanged no skin issues noted

## 2023-09-24 NOTE — Plan of Care (Signed)
     Referral previously received for [name] [name] for goals of care discussion. Chart reviewed.  See last PMT note dated 09/23/23.  Chart reviewed and no significant clinical changes or ongoing palliative needs noted. At this time goals are clear. We will sign off for now and discontinue consult order. Please contact us and enter a new consult order for any new palliative care needs.  Thank you for your referral and allowing PMT to assist in [name] [name]'s care.   Sarina Ser, NP Palliative Medicine Team Phone: [phone]  NO CHARGE

## 2023-09-25 LAB — CULTURE, BLOOD (ROUTINE X 2)
Culture: NO GROWTH
Culture: NO GROWTH

## 2023-09-25 LAB — BASIC METABOLIC PANEL WITH GFR
Anion gap: 6 (ref 5–15)
BUN: 48 mg/dL — ABNORMAL HIGH (ref 8–23)
CO2: 23 mmol/L (ref 22–32)
Calcium: 8.6 mg/dL — ABNORMAL LOW (ref 8.9–10.3)
Chloride: 114 mmol/L — ABNORMAL HIGH (ref 98–111)
Creatinine, Ser: 1.1 mg/dL (ref 0.61–1.24)
GFR, Estimated: >60 mL/min (ref >60)
Glucose, Bld: 127 mg/dL — ABNORMAL HIGH (ref 70–99)
Potassium: 4.2 mmol/L (ref 3.5–5.1)
Sodium: 143 mmol/L (ref 135–145)

## 2023-09-25 LAB — CBC
HCT: 24.8 % — ABNORMAL LOW (ref 39.0–52.0)
Hemoglobin: 7.2 g/dL — ABNORMAL LOW (ref 13.0–17.0)
MCH: 25.5 pg — ABNORMAL LOW (ref 26.0–34.0)
MCHC: 29 g/dL — ABNORMAL LOW (ref 30.0–36.0)
MCV: 87.9 fL (ref 80.0–100.0)
Platelets: 234 10*3/uL (ref 150–400)
RBC: 2.82 MIL/uL — ABNORMAL LOW (ref 4.22–5.81)
RDW: 16.8 % — ABNORMAL HIGH (ref 11.5–15.5)
WBC: 6 10*3/uL (ref 4.0–10.5)
nRBC: 0 % (ref 0.0–0.2)

## 2023-09-25 LAB — MAGNESIUM: Magnesium: 2 mg/dL (ref 1.7–2.4)

## 2023-09-25 LAB — PHOSPHORUS: Phosphorus: 3.1 mg/dL (ref 2.5–4.6)

## 2023-09-25 MED ORDER — SODIUM CHLORIDE 0.9 % IV SOLN
3.0000 g | Freq: Three times a day (TID) | INTRAVENOUS | Status: DC
  Administered 2023-09-25 – 2023-09-26 (×3): 3 g via INTRAVENOUS
  Filled 2023-09-25 (×3): qty 8

## 2023-09-25 NOTE — Progress Notes (Addendum)
 PROGRESS NOTE    [name] [name]  VWU:[id number] DOB: September 03, 1933 DOA: 09/20/2023 PCP: Marcine Matar, MD   Brief Narrative: [name] [name] is a 89 y.o. male with a history of dementia, hypertension, hypothyroidism, BPH, anemia, gout, venous insufficiency, dysphagia status post PEG tube placement.  Patient presented secondary to loss of consciousness and failure to thrive.  Syncope workup initiated.  Patient was found to have evidence of multifocal pneumonia started on empiric antibiotics.   Assessment/Plan:  Syncope Unclear etiology. Troponin elevated with negative delta. No chest pain. EKG non-acute. Transthoracic Echocardiogram ordered and pending. CT head imaging non-acute.  Multifocal pneumonia Possible aspiration etiology. Patient initially given Ceftriaxone/Azithromycin and transitioned to Unasyn and azithromycin. No leukocytosis. Procalcitonin is undetectable. COVID-19, RSV and influenza negative. Recommendation for repeat CT scan in 3 months to evaluate for resolution and exclude underlying masses. Blood cultures no growth to date. Completed antibiotics -Flutter valve/incentive spirometer if able  Failure to thrive -Palliative care consult -PT/OT  Fall out of bed Unwitnessed. Patient without noted injury although son mentions that patient complained of headache; patient declines any head injury. CT head unremarkable.  Primary hypertension Patient is listed as taking metoprolol tartrate, but however is not taking as an outpatient.  Blood pressure is uncontrolled. -Continue amlodipine 5 mg daily.  BPH Noted. Not on medication therapy as an outpatient.  Elevated creatinine Baseline creatinine of about 1-1.1. Secondary to poor fluid intake. Creatinine up to 1.36. Trending down. -Discontinue D5 IV fluids -Free water per PEG tube  PEG tube dependence Dysphagia Noted.  Patient is on T-piece as outpatient.  Tube feeds held currently. Tube confirmed in good position on CT imaging.   Speech therapy consulted. PEG tube dislodged with fall on 3/30. PEG tube placement confirmed on x-ray. Discussed with general surgery and no need for suture replacement. -SLP recommendations (3/27): Aspiration Risk Severe aspiration risk;Risk for inadequate nutrition/hydration  Diet Recommendation NPO;Alternative means - long-term   Medication Administration:  Via alternative means  Other  Recommendations Recommended Consults:  (Consider palliative consult) Oral Care Recommendations: Oral care QID    -Dietitian recommendations (3/28): Initiate tube feeding via PEG: Jevity 1.5 at 60 ml/h (1440 ml per day) Prosource TF20 60 ml BID. This regimen provides: 2320 kcal, 110g protein, free water  TF regimen reccomendation at d/c: 1.5 cartons Jevity 1.5 QID + 1 carton Jevity 1.5 once daily (7 cartons daily) 30ml free water flushes before and after each bolus ( total) Prosource 60 mL daily. This provides 80 kcal and 20g of protein per packet This regimen provides: 2565 kcal, 126g protein, free water  Hypothyroidism -Continue levothyroxine 75 mcg daily per tube  Severe malnutrition Patient is currently PEG tube dependent.  Dietitian consulted on admission.  Constipation Rectal stool ball noted on CT imaging measuring 7 cm. -Dulcolax suppository; if fails, SMOG enema  Dementia Noted. -Delirium precautions  Pressure injury Noted on admission.  Wound care consulted.  Hyperkalemia Potassium of 5.3 on admission with peak of 6.2.  Initially resolved with fluid, now recurrent. Resolved.  Hypernatremia Likely related to dehydration from no oral intake and poor intake via PEG tube.  Sodium as high as 147. Resolved while on IV fluids  Aortic atherosclerosis Noted on CT imaging  Retractile bilateral testis Noted on CT imaging. Will need to correlate clinically   DVT prophylaxis: SCDs Code Status:   Code Status: Full Code Family Communication: None at  bedside. Disposition Plan: Medically stable for discharge home if hemoglopbin is stable  Consultants:  Palliative care medicine  Procedures:  None  Antimicrobials: Ceftriaxone Azithromycin Unasyn    Subjective: No concerns this morning.  Objective: BP 139/73 (BP Location: Left Arm)   Pulse 79   Temp (!) 97.5 F (36.4 C) (Oral)   Resp 17   Ht 6' (1.829 m)   Wt 51.1 kg   SpO2 99%   BMI 15.28 kg/m   Examination:  General exam: Appears calm and comfortable. Cachectic. Respiratory system: Clear to auscultation. Respiratory effort normal. Cardiovascular system: S1 & S2 heard, RRR. Gastrointestinal system: Abdomen is nondistended, soft and nontender. Normal bowel sounds heard. Central nervous system: Alert. Musculoskeletal: No edema. No calf tenderness   Data Reviewed: I have personally reviewed following labs and imaging studies   Last CBC Lab Results  Component Value Date   WBC 15.0 (H) 09/23/2023   HGB 7.6 (L) 09/23/2023   HCT 25.7 (L) 09/23/2023   MCV 87.4 09/23/2023   MCH 25.9 (L) 09/23/2023   RDW 16.8 (H) 09/23/2023   PLT 247 09/23/2023     Last metabolic panel Lab Results  Component Value Date   GLUCOSE 127 (H) 09/25/2023   NA 143 09/25/2023   K 4.2 09/25/2023   CL 114 (H) 09/25/2023   CO2 23 09/25/2023   BUN 48 (H) 09/25/2023   CREATININE 1.10 09/25/2023   GFRNONAA >60 09/25/2023   CALCIUM 8.6 (L) 09/25/2023   PHOS 3.1 09/25/2023   PROT 7.4 09/21/2023   ALBUMIN 2.2 (L) 09/21/2023   LABGLOB 3.1 04/10/2023   AGRATIO 1.1 (L) 03/10/2022   BILITOT 0.4 09/21/2023   ALKPHOS 57 09/21/2023   AST 24 09/21/2023   ALT 18 09/21/2023   ANIONGAP 6 09/25/2023     Creatinine Clearance: Estimated Creatinine Clearance: 33.6 mL/min (by C-G formula based on SCr of 1.1 mg/dL).  Recent Results (from the past 240 hours)  Culture, blood (Routine X 2) w Reflex to ID Panel     Status: None (Preliminary result)   Collection Time: 09/20/23  5:33 PM    Specimen: BLOOD RIGHT ARM  Result Value Ref Range Status   Specimen Description BLOOD RIGHT ARM  Final   Special Requests   Final    BOTTLES DRAWN AEROBIC AND ANAEROBIC Blood Culture results may not be optimal due to an inadequate volume of blood received in culture bottles   Culture   Final    NO GROWTH 4 DAYS Performed at [facility], [address] [place], [place] [address]    Report Status PENDING  Incomplete  Culture, blood (Routine X 2) w Reflex to ID Panel     Status: None (Preliminary result)   Collection Time: 09/20/23  5:34 PM   Specimen: BLOOD  Result Value Ref Range Status   Specimen Description BLOOD SITE NOT SPECIFIED  Final   Special Requests   Final    BOTTLES DRAWN AEROBIC AND ANAEROBIC Blood Culture results may not be optimal due to an inadequate volume of blood received in culture bottles   Culture   Final    NO GROWTH 4 DAYS Performed at [facility], [address], [place], [place] [address]    Report Status PENDING  Incomplete  Resp panel by RT-PCR (RSV, Flu A&B, Covid) Anterior Nasal Swab     Status: None   Collection Time: 09/20/23 11:38 PM   Specimen: Anterior Nasal Swab  Result Value Ref Range Status   SARS Coronavirus 2 by RT PCR NEGATIVE NEGATIVE Final  Influenza A by PCR NEGATIVE NEGATIVE Final   Influenza B by PCR NEGATIVE NEGATIVE Final    Comment: (NOTE) The Xpert Xpress SARS-CoV-2/FLU/RSV plus assay is intended as an aid in the diagnosis of influenza from Nasopharyngeal swab specimens and should not be used as a sole basis for treatment. Nasal washings and aspirates are unacceptable for Xpert Xpress SARS-CoV-2/FLU/RSV testing.  Fact Sheet for Patients: BloggerCourse.com  Fact Sheet for Healthcare Providers: SeriousBroker.it  This test is not yet approved or cleared by the [place] FDA and has been authorized for detection and/or diagnosis of SARS-CoV-2 by FDA under an  Emergency Use Authorization (EUA). This EUA will remain in effect (meaning this test can be used) for the duration of the COVID-19 declaration under Section 564(b)(1) of the Act, 21 U.S.C. section 360bbb-3(b)(1), unless the authorization is terminated or revoked.     Resp Syncytial Virus by PCR NEGATIVE NEGATIVE Final    Comment: (NOTE) Fact Sheet for Patients: BloggerCourse.com  Fact Sheet for Healthcare Providers: SeriousBroker.it  This test is not yet approved or cleared by the [place] FDA and has been authorized for detection and/or diagnosis of SARS-CoV-2 by FDA under an Emergency Use Authorization (EUA). This EUA will remain in effect (meaning this test can be used) for the duration of the COVID-19 declaration under Section 564(b)(1) of the Act, 21 U.S.C. section 360bbb-3(b)(1), unless the authorization is terminated or revoked.  Performed at [name], [address] [place], [place] [address]   Respiratory (~20 pathogens) panel by PCR     Status: None   Collection Time: 09/21/23  8:41 AM   Specimen: Nasopharyngeal Swab; Respiratory  Result Value Ref Range Status   Adenovirus NOT DETECTED NOT DETECTED Final   Coronavirus 229E NOT DETECTED NOT DETECTED Final    Comment: (NOTE) The Coronavirus on the Respiratory Panel, DOES NOT test for the novel  Coronavirus (2019 nCoV)    Coronavirus HKU1 NOT DETECTED NOT DETECTED Final   Coronavirus NL63 NOT DETECTED NOT DETECTED Final   Coronavirus OC43 NOT DETECTED NOT DETECTED Final   Metapneumovirus NOT DETECTED NOT DETECTED Final   Rhinovirus / Enterovirus NOT DETECTED NOT DETECTED Final   Influenza A NOT DETECTED NOT DETECTED Final   Influenza B NOT DETECTED NOT DETECTED Final   Parainfluenza Virus 1 NOT DETECTED NOT DETECTED Final   Parainfluenza Virus 2 NOT DETECTED NOT DETECTED Final   Parainfluenza Virus 3 NOT DETECTED NOT DETECTED Final   Parainfluenza  Virus 4 NOT DETECTED NOT DETECTED Final   Respiratory Syncytial Virus NOT DETECTED NOT DETECTED Final   Bordetella pertussis NOT DETECTED NOT DETECTED Final   Bordetella Parapertussis NOT DETECTED NOT DETECTED Final   Chlamydophila pneumoniae NOT DETECTED NOT DETECTED Final   Mycoplasma pneumoniae NOT DETECTED NOT DETECTED Final    Comment: Performed at [facility], [address], [place], [place] [address]      Radiology Studies: DG ABDOMEN PEG TUBE LOCATION Result Date: 09/24/2023 CLINICAL DATA:  Gastrostomy tube replacement. EXAM: ABDOMEN - 1 VIEW COMPARISON:  CT chest, abdomen, and pelvis dated September 20, 2023. FINDINGS: 3 mm Gastrografin injected through the gastrostomy tube demonstrates normal opacification of the stomach. No definite contrast extravasation. Normal bowel gas pattern. IMPRESSION: 1. Gastrostomy tube within the stomach. Electronically Signed   By: Obie Dredge M.D.   On: 09/24/2023 12:58   CT HEAD WO CONTRAST ( ) Result Date: 09/24/2023 CLINICAL DATA:  Unwitnessed fall with headache EXAM: CT HEAD WITHOUT CONTRAST TECHNIQUE:  Contiguous axial images were obtained from the base of the skull through the vertex without intravenous contrast. RADIATION DOSE REDUCTION: This exam was performed according to the departmental dose-optimization program which includes automated exposure control, adjustment of the mA and/or kV according to patient size and/or use of iterative reconstruction technique. COMPARISON:  Four days prior FINDINGS: Brain: No evidence of acute infarction, hemorrhage, hydrocephalus, extra-axial collection or mass lesion/mass effect. Generalized atrophy with chronic small vessel ischemia. Vascular: No hyperdense vessel or unexpected calcification. Skull: Normal. Negative for fracture or focal lesion. Sinuses/Orbits: No acute finding. IMPRESSION: No acute finding.  No evidence of intracranial injury. Electronically Signed   By: Tiburcio Pea M.D.   On:  09/24/2023 12:17   ECHOCARDIOGRAM COMPLETE Result Date: 09/23/2023    ECHOCARDIOGRAM REPORT   Patient Name:   JVEON POUND  Date of Exam: 09/23/2023 Medical Rec #:  [id number]  Height:       72.0 in Accession #:    [id number] Weight:       112.7 lb Date of Birth:  1933-09-19   BSA:          1.669 m Patient Age:    89 years   BP:           121/73 mmHg Patient Gender: M          HR:           65 bpm. Exam Location:  Inpatient Procedure: 2D Echo, Color Doppler and Cardiac Doppler (Both Spectral and Color            Flow Doppler were utilized during procedure). Indications:    Elevated Troponin  History:        Patient has prior history of Echocardiogram examinations, most                 recent 04/17/2023.  Sonographer:    Harriette Bouillon RDCS Referring Phys: [id number] ANASTASSIA DOUTOVA IMPRESSIONS  1. Left ventricular ejection fraction, by estimation, is 60 to 65%. The left ventricle has normal function. The left ventricle has no regional wall motion abnormalities. There is mild concentric left ventricular hypertrophy. Left ventricular diastolic parameters are consistent with Grade I diastolic dysfunction (impaired relaxation).  2. Right ventricular systolic function is normal. The right ventricular size is normal.  3. The mitral valve is normal in structure. Trivial mitral valve regurgitation. No evidence of mitral stenosis.  4. The aortic valve is tricuspid. Aortic valve regurgitation is not visualized. Aortic valve sclerosis is present, with no evidence of aortic valve stenosis. IVC not well visualzied Comparison(s): No significant change from prior study. Prior images reviewed side by side. Suboptimal imaging windows in this study; patient physicaly unable to maneuver for optimization. FINDINGS  Left Ventricle: Left ventricular ejection fraction, by estimation, is 60 to 65%. The left ventricle has normal function. The left ventricle has no regional wall motion abnormalities. Strain was performed and the global longitudinal  strain is indeterminate. The left ventricular internal cavity size was small. There is mild concentric left ventricular hypertrophy. Left ventricular diastolic parameters are consistent with Grade I diastolic dysfunction (impaired relaxation). Right Ventricle: The right ventricular size is normal. No increase in right ventricular wall thickness. Right ventricular systolic function is normal. Left Atrium: Left atrial size was normal in size. Right Atrium: Right atrial size was normal in size. Pericardium: Trivial pericardial effusion is present. The pericardial effusion is posterior to the left ventricle. Mitral Valve: The mitral valve is normal in structure. Trivial mitral valve regurgitation.  No evidence of mitral valve stenosis. Tricuspid Valve: The tricuspid valve is normal in structure. Tricuspid valve regurgitation is not demonstrated. No evidence of tricuspid stenosis. Aortic Valve: The aortic valve is tricuspid. Aortic valve regurgitation is not visualized. Aortic valve sclerosis is present, with no evidence of aortic valve stenosis. Pulmonic Valve: The pulmonic valve was not well visualized. Pulmonic valve regurgitation is not visualized. Aorta: The aortic root is normal in size and structure and the ascending aorta was not well visualized. IAS/Shunts: The interatrial septum was not well visualized. Additional Comments: 3D was performed not requiring image post processing on an independent workstation and was indeterminate.  LEFT VENTRICLE PLAX 2D LVIDd:         3.90 cm   Diastology LVIDs:         2.60 cm   LV e' medial:    5.77 cm/s LV PW:         1.20 cm   LV E/e' medial:  13.2 LV IVS:        1.30 cm   LV e' lateral:   6.96 cm/s LVOT diam:     2.10 cm   LV E/e' lateral: 10.9 LV SV:         77 LV SV Index:   46 LVOT Area:     3.46 cm  RIGHT VENTRICLE RV S prime:     13.30 cm/s TAPSE (M-mode): 1.6 cm LEFT ATRIUM             Index        RIGHT ATRIUM           Index LA diam:        2.90 cm 1.74 cm/m   RA  Area:     10.70 cm LA Vol (A2C):   42.9 ml 25.70 ml/m  RA Volume:   22.40 ml  13.42 ml/m LA Vol (A4C):   35.7 ml 21.39 ml/m LA Biplane Vol: 40.4 ml 24.20 ml/m  AORTIC VALVE LVOT Vmax:   113.00 cm/s LVOT Vmean:  78.200 cm/s LVOT VTI:    0.223 m  AORTA Ao Root diam: 3.00 cm MITRAL VALVE MV Area (PHT): 2.75 cm    SHUNTS MV Decel Time: 276 msec    Systemic VTI:  0.22 m MV E velocity: 76.00 cm/s  Systemic Diam: 2.10 cm MV A velocity: 81.20 cm/s MV E/A ratio:  0.94 Riley Lam MD Electronically signed by Riley Lam MD Signature Date/Time: 09/23/2023/1:03:51 PM    Final       LOS: 4 days    Jacquelin Hawking, MD [facility] 09/25/2023, 9:03 AM   If 7PM-7AM, please contact night-coverage [email or web address]

## 2023-09-25 NOTE — Progress Notes (Signed)
 Pharmacy Antibiotic Note  [name] [name] is a 89 y.o. male admitted on 09/20/2023 with loss of consciousness and concerns for aspiration pneumonia.  Pharmacy has been consulted for Unasyn dosing.  CTA finding multifocal pneumonia with debris noted within the left lower bronchials.   Unasyn continues for multifocal PNA, possible aspiration etiology. On day #5 of unasyn and azithromycin.  WBC  went up to 15 on 3/29,  today's CBC is pending. Afebrile, Scr up from 1 to 1.36 > trended down to 1.22>  now 1.1.  CrCl 33.6 ml/ min 3/26 blood cultures are negative/final.   Plan: Increase Unasyn to  3g IV every 8 hours due to improved renal function.  Azithromycin 500mg  IV every 24 hours per MD Monitor renal function  Follow up signs of clinical improvement, LOT, de-escalation of antibiotics   Height: 6' (182.9 cm) Weight: 51.1 kg (112 lb 10.5 oz) IBW/kg (Calculated) : 77.6 Temp (24hrs), Avg:98 F (36.7 C), Min:97.5 F (36.4 C), Max:98.7 F (37.1 C)  Recent Labs  Lab 09/20/23 1733 09/20/23 1746 09/20/23 1932 09/20/23 2330 09/20/23 2338 09/21/23 0526 09/22/23 0251 09/23/23 0256 09/24/23 0239 09/25/23 0327  WBC 6.5  --   --   --   --  6.8 14.1* 15.0*  --   --   CREATININE 1.26*  --   --   --    < > 1.07 1.36* 1.36* 1.22 1.10  LATICACIDVEN  --  2.1* 1.8 1.2  --  1.2  --   --   --   --    < > = values in this interval not displayed.    Estimated Creatinine Clearance: 33.6 mL/min (by C-G formula based on SCr of 1.1 mg/dL).    Allergies  Allergen Reactions   Pork-Derived Products Other (See Comments)    Religious Reasons    Antimicrobials this admission: Unasyn 3/27 >>  Azithromycin 3/27 >>   Microbiology results: 3/26 BCx: negative 3/27 Resp PCR: neg   Thank you for allowing pharmacy to participate in this patient's care,  Noah Delaine, RPh Clinical Pharmacist 8:00-3:30 PM: [phone] 3:30-10:00 PM: [phone] After 10PM, Main Pharmacy [phone]  09/25/2023 11:51 AM  Please  check AMION for all [name] Pharmacy phone numbers After 10:00 PM, call Main Pharmacy [phone]

## 2023-09-26 LAB — CBC
HCT: 25.1 % — ABNORMAL LOW (ref 39.0–52.0)
Hemoglobin: 7.4 g/dL — ABNORMAL LOW (ref 13.0–17.0)
MCH: 25.9 pg — ABNORMAL LOW (ref 26.0–34.0)
MCHC: 29.5 g/dL — ABNORMAL LOW (ref 30.0–36.0)
MCV: 87.8 fL (ref 80.0–100.0)
Platelets: 225 10*3/uL (ref 150–400)
RBC: 2.86 MIL/uL — ABNORMAL LOW (ref 4.22–5.81)
RDW: 16.9 % — ABNORMAL HIGH (ref 11.5–15.5)
WBC: 5.6 10*3/uL (ref 4.0–10.5)
nRBC: 0 % (ref 0.0–0.2)

## 2023-09-26 LAB — BASIC METABOLIC PANEL WITH GFR
Anion gap: 8 (ref 5–15)
BUN: 48 mg/dL — ABNORMAL HIGH (ref 8–23)
CO2: 25 mmol/L (ref 22–32)
Calcium: 8.9 mg/dL (ref 8.9–10.3)
Chloride: 111 mmol/L (ref 98–111)
Creatinine, Ser: 1.08 mg/dL (ref 0.61–1.24)
GFR, Estimated: >60 mL/min (ref >60)
Glucose, Bld: 107 mg/dL — ABNORMAL HIGH (ref 70–99)
Potassium: 4.6 mmol/L (ref 3.5–5.1)
Sodium: 144 mmol/L (ref 135–145)

## 2023-09-26 LAB — MAGNESIUM: Magnesium: 2 mg/dL (ref 1.7–2.4)

## 2023-09-26 LAB — PHOSPHORUS: Phosphorus: 2.6 mg/dL (ref 2.5–4.6)

## 2023-09-26 MED ORDER — POLYETHYLENE GLYCOL 3350 17 G PO PACK: 17.0000 g | PACK | Freq: Every day | ORAL | Status: DC

## 2023-09-26 NOTE — Plan of Care (Signed)
  Problem: Clinical Measurements: Goal: Ability to maintain clinical measurements within normal limits will improve Outcome: Progressing Goal: Will remain free from infection Outcome: Progressing   Problem: Activity: Goal: Risk for activity intolerance will decrease Outcome: Progressing   Problem: Nutrition: Goal: Adequate nutrition will be maintained Outcome: Progressing   Problem: Safety: Goal: Ability to remain free from injury will improve Outcome: Progressing   

## 2023-09-26 NOTE — TOC Progression Note (Addendum)
 Transition of Care [facility]) - Progression Note    Patient Details  Name: [name] [name] MRN: [id number] Date of Birth: 1934/02/11  [facility] CM/SW Contact  Leone Haven, RN Phone Number: 09/26/2023, 11:08 AM  Clinical Narrative:    This NCM has been notified by the NCM from [facility] that he was getting tube feeds from [employer] and [facility] was paying for them for 1.5 years.  [employer] has informed family that patient now has to pay for tube feeds which will be 799.00 per month.  He applied for patient asst thru [employer] nutrition patient assistance. He told Jane with Transition of Care at [facility] that he was approved, but not sure how he knows he was approved.  He lives with his son  and his wife who has seven kids, and one on the way.  [employer] is applying for Emergency Medicaid for patient.  The son says he has no money to pay for any feeds for patient.    NCM contacted [employer] and they have no record of patient being approved. NCM will fax the [employer] nutrition patient ast  program application with his 1099 form to them.  Per Timothy Lasso  he would be able to do a LOG for 30 days only.  Will need to see if patient will be approved for the PAP first. NCM faxed [employer] pap .     Expected Discharge Plan: Home/Self Care Barriers to Discharge: Continued Medical Work up  Expected Discharge Plan and Services In-house Referral: Clinical Social Work     Living arrangements for the past 2 months: Single Family Home                                       Social Determinants of Health (SDOH) Interventions SDOH Screenings   Food Insecurity: No Food Insecurity (09/21/2023)  Housing: Low Risk  (09/21/2023)  Transportation Needs: No Transportation Needs (09/21/2023)  Utilities: Not At Risk (09/21/2023)  Depression (PHQ2-9): Medium Risk (03/10/2022)  Social Connections: Moderately Integrated (09/21/2023)  Tobacco Use: Low Risk  (09/20/2023)    Readmission Risk Interventions     No data to  display

## 2023-09-26 NOTE — Progress Notes (Signed)
 PROGRESS NOTE    Linsey Hirota  UJW:[id number] DOB: Jul 10, 1933 DOA: 09/20/2023 PCP: Marcine Matar, MD   Brief Narrative: Koury Roddy is a 89 y.o. male with a history of dementia, hypertension, hypothyroidism, BPH, anemia, gout, venous insufficiency, dysphagia status post PEG tube placement.  Patient presented secondary to loss of consciousness and failure to thrive.  Syncope workup initiated.  Patient was found to have evidence of multifocal pneumonia started on empiric antibiotics.   Assessment/Plan:  Syncope Unclear etiology. Troponin elevated with negative delta. No chest pain. EKG non-acute. Transthoracic Echocardiogram ordered and pending. CT head imaging non-acute.  Multifocal pneumonia Possible aspiration etiology. Patient initially given Ceftriaxone/Azithromycin and transitioned to Unasyn and azithromycin. No leukocytosis. Procalcitonin is undetectable. COVID-19, RSV and influenza negative. Recommendation for repeat CT scan in 3 months to evaluate for resolution and exclude underlying masses. Blood cultures no growth to date. Completed antibiotics -Flutter valve/incentive spirometer if able  Failure to thrive -Palliative care consulted -PT/OT -Continue tube feeds  Fall out of bed Unwitnessed. Patient without noted injury although son mentions that patient complained of headache; patient declines any head injury. CT head unremarkable.  Primary hypertension Patient is listed as taking metoprolol tartrate, but however is not taking as an outpatient.  Blood pressure is uncontrolled. -Continue amlodipine 5 mg daily.  BPH Noted. Not on medication therapy as an outpatient.  Elevated creatinine Baseline creatinine of about 1-1.1. Secondary to poor fluid intake. Creatinine up to 1.36. Trending down back to baseline. -Free water per PEG tube  PEG tube dependence Dysphagia Noted.  Patient is on T-piece as outpatient.  Tube feeds held currently. Tube confirmed in good position on  CT imaging.  Speech therapy consulted. PEG tube dislodged with fall on 3/30. PEG tube placement confirmed on x-ray. Discussed with general surgery and no need for suture replacement. -SLP recommendations (3/27): Aspiration Risk Severe aspiration risk;Risk for inadequate nutrition/hydration  Diet Recommendation NPO;Alternative means - long-term   Medication Administration:  Via alternative means  Other  Recommendations Recommended Consults:  (Consider palliative consult) Oral Care Recommendations: Oral care QID    -Dietitian recommendations (3/28): Initiate tube feeding via PEG: Jevity 1.5 at 60 ml/h (1440 ml per day) Prosource TF20 60 ml BID. This regimen provides: 2320 kcal, 110g protein, free water  TF regimen reccomendation at d/c: 1.5 cartons Jevity 1.5 QID + 1 carton Jevity 1.5 once daily (7 cartons daily) 30ml free water flushes before and after each bolus ( total) Prosource 60 mL daily. This provides 80 kcal and 20g of protein per packet This regimen provides: 2565 kcal, 126g protein, free water  Hypothyroidism -Continue levothyroxine 75 mcg daily per tube  Severe malnutrition Patient is currently PEG tube dependent.  Dietitian consulted on admission.  Constipation Rectal stool ball noted on CT imaging measuring 7 cm. Patient with a significant bowel movement after SMOG enema. -Will hold MiraLAX as patient is having frequent bowel movements at this time. Would likely benefit from [name] as needed on discharge  Dementia Noted. -Delirium precautions  Pressure injury Noted on admission.  Wound care consulted.  Hyperkalemia Potassium of 5.3 on admission with peak of 6.2.  Initially resolved with fluid, now recurrent. Resolved.  Hypernatremia Likely related to dehydration from no oral intake and poor intake via PEG tube.  Sodium as high as 147. Resolved while on IV fluids. Remains stable with tube feeds.  Acute on chronic anemia Baseline hemoglobin  of around 9-10. Downward drift to a low of 7.2 . Stable.  FOBT ordered but is still not collected -Follow-up FOBT  Aortic atherosclerosis Noted on CT imaging  Retractile bilateral testis Noted on CT imaging.   DVT prophylaxis: SCDs Code Status:   Code Status: Full Code Family Communication: None at bedside. Disposition Plan: Medically stable for discharge home pending home health services and equipment   Consultants:  Palliative care medicine  Procedures:  None  Antimicrobials: Ceftriaxone Azithromycin Unasyn    Subjective: No issues  Objective: BP (!) 137/59 (BP Location: Right Arm)   Pulse 82   Temp 97.8 F (36.6 C) (Oral)   Resp 15   Ht 6' (1.829 m)   Wt 51.1 kg   SpO2 99%   BMI 15.28 kg/m   Examination:  General exam: Appears calm and comfortable. Cachectic appearing. Chronically ill appearing. Respiratory system: Clear to auscultation. Respiratory effort normal. Cardiovascular system: S1 & S2 heard, RRR. Gastrointestinal system: Abdomen is nondistended, soft and nontender. Normal bowel sounds heard. Central nervous system: Alert. Musculoskeletal: No edema. No calf tenderness   Data Reviewed: I have personally reviewed following labs and imaging studies   Last CBC Lab Results  Component Value Date   WBC 5.6 09/26/2023   HGB 7.4 (L) 09/26/2023   HCT 25.1 (L) 09/26/2023   MCV 87.8 09/26/2023   MCH 25.9 (L) 09/26/2023   RDW 16.9 (H) 09/26/2023   PLT 225 09/26/2023     Last metabolic panel Lab Results  Component Value Date   GLUCOSE 107 (H) 09/26/2023   NA 144 09/26/2023   K 4.6 09/26/2023   CL 111 09/26/2023   CO2 25 09/26/2023   BUN 48 (H) 09/26/2023   CREATININE 1.08 09/26/2023   GFRNONAA >60 09/26/2023   CALCIUM 8.9 09/26/2023   PHOS 2.6 09/26/2023   PROT 7.4 09/21/2023   ALBUMIN 2.2 (L) 09/21/2023   LABGLOB 3.1 04/10/2023   AGRATIO 1.1 (L) 03/10/2022   BILITOT 0.4 09/21/2023   ALKPHOS 57 09/21/2023   AST 24 09/21/2023   ALT  18 09/21/2023   ANIONGAP 8 09/26/2023     Creatinine Clearance: Estimated Creatinine Clearance: 34.2 mL/min (by C-G formula based on SCr of 1.08 mg/dL).  Recent Results (from the past 240 hours)  Culture, blood (Routine X 2) w Reflex to ID Panel     Status: None   Collection Time: 09/20/23  5:33 PM   Specimen: BLOOD RIGHT ARM  Result Value Ref Range Status   Specimen Description BLOOD RIGHT ARM  Final   Special Requests   Final    BOTTLES DRAWN AEROBIC AND ANAEROBIC Blood Culture results may not be optimal due to an inadequate volume of blood received in culture bottles   Culture   Final    NO GROWTH 5 DAYS Performed at [facility], [address], [place], [place] [address]    Report Status 09/25/2023 FINAL  Final  Culture, blood (Routine X 2) w Reflex to ID Panel     Status: None   Collection Time: 09/20/23  5:34 PM   Specimen: BLOOD  Result Value Ref Range Status   Specimen Description BLOOD SITE NOT SPECIFIED  Final   Special Requests   Final    BOTTLES DRAWN AEROBIC AND ANAEROBIC Blood Culture results may not be optimal due to an inadequate volume of blood received in culture bottles   Culture   Final    NO GROWTH 5 DAYS Performed at [facility], [address], [place], [place] [address]    Report Status  09/25/2023 FINAL  Final  Resp panel by RT-PCR (RSV, Flu A&B, Covid) Anterior Nasal Swab     Status: None   Collection Time: 09/20/23 11:38 PM   Specimen: Anterior Nasal Swab  Result Value Ref Range Status   SARS Coronavirus 2 by RT PCR NEGATIVE NEGATIVE Final   Influenza A by PCR NEGATIVE NEGATIVE Final   Influenza B by PCR NEGATIVE NEGATIVE Final    Comment: (NOTE) The Xpert Xpress SARS-CoV-2/FLU/RSV plus assay is intended as an aid in the diagnosis of influenza from Nasopharyngeal swab specimens and should not be used as a sole basis for treatment. Nasal washings and aspirates are unacceptable for Xpert Xpress SARS-CoV-2/FLU/RSV testing.  Fact Sheet  for Patients: BloggerCourse.com  Fact Sheet for Healthcare Providers: SeriousBroker.it  This test is not yet approved or cleared by the [place] FDA and has been authorized for detection and/or diagnosis of SARS-CoV-2 by FDA under an Emergency Use Authorization (EUA). This EUA will remain in effect (meaning this test can be used) for the duration of the COVID-19 declaration under Section 564(b)(1) of the Act, 21 U.S.C. section 360bbb-3(b)(1), unless the authorization is terminated or revoked.     Resp Syncytial Virus by PCR NEGATIVE NEGATIVE Final    Comment: (NOTE) Fact Sheet for Patients: BloggerCourse.com  Fact Sheet for Healthcare Providers: SeriousBroker.it  This test is not yet approved or cleared by the [place] FDA and has been authorized for detection and/or diagnosis of SARS-CoV-2 by FDA under an Emergency Use Authorization (EUA). This EUA will remain in effect (meaning this test can be used) for the duration of the COVID-19 declaration under Section 564(b)(1) of the Act, 21 U.S.C. section 360bbb-3(b)(1), unless the authorization is terminated or revoked.  Performed at [facility], [address] [place], [place] [address]   Respiratory (~20 pathogens) panel by PCR     Status: None   Collection Time: 09/21/23  8:41 AM   Specimen: Nasopharyngeal Swab; Respiratory  Result Value Ref Range Status   Adenovirus NOT DETECTED NOT DETECTED Final   Coronavirus 229E NOT DETECTED NOT DETECTED Final    Comment: (NOTE) The Coronavirus on the Respiratory Panel, DOES NOT test for the novel  Coronavirus (2019 nCoV)    Coronavirus HKU1 NOT DETECTED NOT DETECTED Final   Coronavirus NL63 NOT DETECTED NOT DETECTED Final   Coronavirus OC43 NOT DETECTED NOT DETECTED Final   Metapneumovirus NOT DETECTED NOT DETECTED Final   Rhinovirus / Enterovirus NOT DETECTED NOT  DETECTED Final   Influenza A NOT DETECTED NOT DETECTED Final   Influenza B NOT DETECTED NOT DETECTED Final   Parainfluenza Virus 1 NOT DETECTED NOT DETECTED Final   Parainfluenza Virus 2 NOT DETECTED NOT DETECTED Final   Parainfluenza Virus 3 NOT DETECTED NOT DETECTED Final   Parainfluenza Virus 4 NOT DETECTED NOT DETECTED Final   Respiratory Syncytial Virus NOT DETECTED NOT DETECTED Final   Bordetella pertussis NOT DETECTED NOT DETECTED Final   Bordetella Parapertussis NOT DETECTED NOT DETECTED Final   Chlamydophila pneumoniae NOT DETECTED NOT DETECTED Final   Mycoplasma pneumoniae NOT DETECTED NOT DETECTED Final    Comment: Performed at [facility], [address], [place], [place] [address]      Radiology Studies: No results found.     LOS: 5 days    Jacquelin Hawking, MD [facility] 09/26/2023, 2:58 PM   If 7PM-7AM, please contact night-coverage [email or web address]

## 2023-09-26 NOTE — Progress Notes (Addendum)
 Nutrition Follow-up  DOCUMENTATION CODES:   Severe malnutrition in context of social or environmental circumstances  INTERVENTION:  Continue TF via PEG: Jevity 1.5 at 100ml/hr ( per day) Provides 2160 kcal, 92g protein, free water daily  Free water q6h per MD ( daily) Total free water daily (TF + FWF):   Resume home TF regimen on discharge: 6 cartons Jevity 1.5  Provides 2130 kcal, 91g protein and free water daily  Coordinated with CSW to try to obtain financial assistance with tube feeding.  Jevity 1.5 preferred as this would better meet estimated needs. Alternative TF plan if unable to obtain Jevity 1.5: 6 cartons daily of Ensure Plus Provides 2100 kcal and 78g protein  NUTRITION DIAGNOSIS:  Severe Malnutrition related to social / environmental circumstances as evidenced by severe fat depletion, severe muscle depletion. - remains applicable  GOAL:   Patient will meet greater than or equal to 90% of their needs - goal met via TF  MONITOR:   TF tolerance  REASON FOR ASSESSMENT:   Consult Diet education  ASSESSMENT:   PMH of Dementia, eczema,  HTN, hypothyroidism, BPH, normocytic anemia, gout, venous insufficiency, dysphagia with PEG placement 03/2022. Patient presents from home with an episode of syncope and hitting head.  PEG tube dislodged during a fall on 3/30. Xray confirms adequate placement and no need for suture replacement.   Called and spoke with pt's son with CSW present, as well, at time of call. Noted family having difficulty with affordability of pt's tube feeding.  They have been receiving financial assistance with TF since 03/2022 which is not currently available and pt does not have insurance.  He has been receiving 6 cartons daily of Jevity 1.5. Jevity 1.5 reported to cost $800/mo.  Discussed alternative option of Equate Plus which would be able half the cost monthly, however pt's son reports that they would not  be able to afford this. He mentions EBT application pending.   CSW working on financial assistance forms.   Pt tolerating current regimen.  Noted pork-derived products listed in chart for religious reasons.   Admit weight: 52.3 kg Current weight: 51.1 kg   Medications: ferrous sulfate, MVI, thiamine daily  Labs: reviewed  Diet Order:   Diet Order             Diet NPO time specified  Diet effective now                   EDUCATION NEEDS:   Education needs have been addressed  Skin:  Skin Assessment: Skin Integrity Issues: Skin Integrity Issues:: DTI DTI: high Left; Medial Deep Tissue Pressure Injury  Last BM:  4/1  Height:   Ht Readings from Last 1 Encounters:  09/21/23 6' (1.829 m)    Weight:   Wt Readings from Last 1 Encounters:  09/22/23 51.1 kg    Ideal Body Weight:  80.9 kg  BMI:  Body mass index is 15.28 kg/m.  Estimated Nutritional Needs:   Kcal:  1800-2000  Protein:  90-105g  Fluid:  >/=1.8L  Drusilla Kanner, RDN, LDN Clinical Nutrition See AMiON for contact information.

## 2023-09-26 NOTE — Progress Notes (Signed)
 CN notified Hamzah Savoca, son that patient will be transferred to 2W38. [name], son vebalize understanding and has no questions for CN. Primary RN called report to receiving unit.

## 2023-09-26 NOTE — Progress Notes (Signed)
 Physical Therapy Treatment Patient Details Name: Kevyn Boquet MRN: [id number] DOB: 1934/02/24 Today's Date: 09/26/2023   History of Present Illness Pt is an 89 y.o. male admitted from home 3/26 with PNA and FTT. PMH: dementia, eczema,  HTN, hypothyroidism, BPH, normocytic anemia, gout, venous insufficiency, dysphagia with PEG placement 03/2022    PT Comments  Pt continues to have minimal active participation in treatment session and continues to require totalA for all mobility. Pt with noted bilat hip and knee flexion contractures and L hip decub due to laying in bed all day at home. Acute PT to follow from a distance as pt at baseline level of function. Family continues to decline SNF/Long term care with desire to keep him at home with son.     If plan is discharge home, recommend the following: Two people to help with walking and/or transfers;Two people to help with bathing/dressing/bathroom;Help with stairs or ramp for entrance;Assist for transportation;Direct supervision/assist for medications management;Assistance with feeding;Supervision due to cognitive status;Direct supervision/assist for financial management;Assistance with cooking/housework   Can travel by private vehicle     No  Equipment Recommendations  Wheelchair (measurements PT);Wheelchair cushion (measurements PT);Hospital bed;Hoyer lift    Recommendations for Other Services       Precautions / Restrictions Precautions Precautions: Fall Recall of Precautions/Restrictions: Impaired Precaution/Restrictions Comments: PEG, decub ulcer L hip Restrictions Weight Bearing Restrictions Per Provider Order: No     Mobility  Bed Mobility Overal bed mobility: Needs Assistance Bed Mobility: Rolling Rolling: Total assist         General bed mobility comments: Pt rolled R/L multiple times with 2+ assistance.    Transfers                   General transfer comment: pt total assist, RN staff to use maximove to transfer  to chair if desired, pt however with bilat hip and knee flexion contractures limiting ability to safely sit    Ambulation/Gait               General Gait Details: deferred   Stairs             Wheelchair Mobility     Tilt Bed    Modified Rankin (Stroke Patients Only)       Balance                                            Communication Communication Communication: Impaired Factors Affecting Communication: Reduced clarity of speech;Difficulty expressing self  Cognition Arousal: Lethargic, Alert (sleeping upon arrival, perked up and spoke with son and per son was coherent and oriented) Behavior During Therapy: Flat affect   PT - Cognitive impairments: History of cognitive impairments (Dementia) Difficult to assess due to: Non-English speaking                     PT - Cognition Comments: No family present. Attempted use of ipad interpreter, Tobi Bastos, but pt not interacting/responding. Following commands: Impaired Following commands impaired:  (No command following observed)    Cueing Cueing Techniques: Verbal cues, Gestural cues, Tactile cues, Visual cues  Exercises Other Exercises Other Exercises: passive stretching to bilat LEs into hip/knee extension    General Comments General comments (skin integrity, edema, etc.): VSS on RA, RN changed dressing on L hip      Pertinent Vitals/Pain Pain Assessment Pain Assessment: Faces  Faces Pain Scale: Hurts a little bit Pain Location: generalized discomfort Pain Descriptors / Indicators: Moaning    Home Living                          Prior Function            PT Goals (current goals can now be found in the care plan section) Acute Rehab PT Goals Patient Stated Goal: Unable to state PT Goal Formulation: Patient unable to participate in goal setting Time For Goal Achievement: 10/05/23 Potential to Achieve Goals: Poor Progress towards PT goals: Not progressing toward  goals - comment    Frequency    Min 1X/week      PT Plan      Co-evaluation              AM-PAC PT "6 Clicks" Mobility   Outcome Measure  Help needed turning from your back to your side while in a flat bed without using bedrails?: Total Help needed moving from lying on your back to sitting on the side of a flat bed without using bedrails?: Total Help needed moving to and from a bed to a chair (including a wheelchair)?: Total Help needed standing up from a chair using your arms (e.g., wheelchair or bedside chair)?: Total Help needed to walk in hospital room?: Total Help needed climbing 3-5 steps with a railing? : Total 6 Click Score: 6    End of Session   Activity Tolerance: Patient limited by lethargy;Patient limited by pain Patient left: in bed;with call bell/phone within reach Nurse Communication: Mobility status;Need for lift equipment PT Visit Diagnosis: Other abnormalities of gait and mobility (R26.89);Muscle weakness (generalized) (M62.81)     Time: 1325-1340 PT Time Calculation (min) (ACUTE ONLY): 15 min  Charges:    $Therapeutic Activity: 8-22 mins PT General Charges $$ ACUTE PT VISIT: 1 Visit                     Lewis Shock, PT, DPT Acute Rehabilitation Services Secure chat preferred Office #: [phone]    Iona Hansen 09/26/2023, 2:28 PM

## 2023-09-26 NOTE — Progress Notes (Signed)
    Durable Medical Equipment  (From admission, onward)           Start     Ordered   09/26/23 1403  For home use only DME Hospital bed  Once       Question Answer Comment  Length of Need Lifetime   Patient has (list medical condition): PNA, stage 3 pressure injury,  FTT( tube feed)   The above medical condition requires: Patient requires the ability to reposition frequently   Head must be elevated greater than: 30 degrees   Bed type Semi-electric   Support Surface: Low Air loss Mattress      09/26/23 1406

## 2023-09-27 LAB — MAGNESIUM: Magnesium: 2 mg/dL (ref 1.7–2.4)

## 2023-09-27 LAB — PHOSPHORUS: Phosphorus: 3.3 mg/dL (ref 2.5–4.6)

## 2023-09-27 NOTE — Plan of Care (Signed)

## 2023-09-27 NOTE — Progress Notes (Signed)
  Progress Note   Patient: [name] [name] WUJ:[id number] DOB: February 17, 1934 DOA: 09/20/2023     6 DOS: the patient was seen and examined on 09/27/2023 at 12:08PM      Brief hospital course: 89 year old M with advanced dementia, bedbound, HTN, hypothyroidism, severe protein calorie malnutrition and failure to thrive status post PEG tube in 2023 due to recurrent aspiration and weight loss in the setting of dysphagia due to cervical spine disease who presented due to having run out of tube feed formula.     Assessment and Plan: Possible pneumonia Chest x-ray on admission showed possible infiltrates.  Treated with antibiotics, completed antibiotics, no further issues. -Repeat CT in 3 months    PEG tube dependent Advanced dementia Dysphagia due to DISH Failure to thrive Severe protein calorie malnutrition -Consult TOC for assistance with procuring tube feed formula  Anemia of chronic disease Hemoglobin stable  Hypertension Blood pressure controlled - Continue amlodipine  Hypothyroidism -Continue levothyroxine  Stage III sacral pressure wound, present on admission - WOC         Subjective: Patient inarticulate, responds yes and no but answers are not reliable.  Nursing have no concerns.     Physical Exam: BP 113/75 (BP Location: Right Arm)   Pulse 94   Temp 98.7 F (37.1 C) (Oral)   Resp 16   Ht 6' (1.829 m)   Wt 51.1 kg   SpO2 98%   BMI 15.28 kg/m   Cachectic elderly adult male, lying in bed, interactive and makes eye contact, but unable to understand anything that he says Tachycardic, regular, no murmurs, no peripheral edema Respiratory rate normal, lungs clear without rales or wheezes Abdomen soft without tenderness palpation or guarding, PEG tube in place   Data Reviewed: Magnesium, phosphorus, creatinine, potassium, and sodium all normal  Family Communication:     Disposition: Status is: Inpatient Medically stable for discharge.  Unclear what a safe  disposition is until family are able to procure tube feed formula        Author: Alberteen Sam, MD 09/27/2023 7:10 PM  For on call review [email or web address].

## 2023-09-27 NOTE — TOC Progression Note (Addendum)
 Transition of Care [facility]) - Progression Note    Patient Details  Name: [name] [name] MRN: [id number] Date of Birth: 1933-07-20  Transition of Care [name]) CM/SW Contact  Janae Bridgeman, RN Phone Number: 09/27/2023, 1:36 PM  Clinical Narrative:    CM called and spoke with Gavin Pound, [facility] with TOC to receive report regarding continuance of care on [facility] since patient was transferred to the unit.  I called [employer] and left a detailed voicemail to determine if patient would qualify for patient assistance.  Application was faxed to [employer] yesterday.  I was unable to reach customer service but will wait for return call to determine eligibility.  Patient's family does not have funding to pay for the tube feeding.  Patient assistance is needed.    I will continue to follow the patient for [name] needs including tube feedings and possible hospital bed if [facility] leadership is able to assist.  I sent a detailed message to Grandville Silos, [facility] Supervisor to inquire if [facility] is able to provide hospital bed or not.  09/27/23 1512 - I called and left another message with [employer].  I called and spoke with York Ram, TOC Supervisor and TOC is able to cover cost of hospital bed at 1000.00 and 30 days for Jevity through [employer].  Shon Baton states that the family would need to find other means for payment for the Ensure/Jevity tube feedings after the 30 days.  I called and updated Dr. Maryfrances Bunnell.  09/27/23 1557 - I called and spoke with [employer] representative and a few items need to be corrected on the Patient assistance application.  I called and spoke with the son by phone and obtained the corrected items on the application.  The son plans to provide me with the 2024 W 2 and I will re-fax the application to [employer] as requested.  The patient's son was provided with my contact information to provide me with updated tax forms in the next 1-2 days for 2024 as requested by [employer].  Macario Golds,  [facility] Supervisor was updated regarding barriers at this time as requested by the attending physician, Dr. Maryfrances Bunnell.   Expected Discharge Plan: Home/Self Care Barriers to Discharge: Continued Medical Work up  Expected Discharge Plan and Services In-house Referral: Clinical Social Work     Living arrangements for the past 2 months: Single Family Home                                       Social Determinants of Health (SDOH) Interventions SDOH Screenings   Food Insecurity: No Food Insecurity (09/21/2023)  Housing: Low Risk  (09/21/2023)  Transportation Needs: No Transportation Needs (09/21/2023)  Utilities: Not At Risk (09/21/2023)  Depression (PHQ2-9): Medium Risk (03/10/2022)  Social Connections: Moderately Integrated (09/21/2023)  Tobacco Use: Low Risk  (09/20/2023)    Readmission Risk Interventions     No data to display

## 2023-09-27 NOTE — Care Plan (Signed)
 Patient suffers from dementia and spends 100% of time in bed.  He has a stage III pressure injury which requires frequent changes to bed position that cannot be achieved with a normal bed.

## 2023-09-28 ENCOUNTER — Telehealth: Payer: Self-pay

## 2023-09-28 LAB — COMPREHENSIVE METABOLIC PANEL WITH GFR
ALT: 21 U/L (ref 0–44)
AST: 27 U/L (ref 15–41)
Albumin: 1.6 g/dL — ABNORMAL LOW (ref 3.5–5.0)
Alkaline Phosphatase: 41 U/L (ref 38–126)
Anion gap: 5 (ref 5–15)
BUN: 46 mg/dL — ABNORMAL HIGH (ref 8–23)
CO2: 27 mmol/L (ref 22–32)
Calcium: 8.9 mg/dL (ref 8.9–10.3)
Chloride: 112 mmol/L — ABNORMAL HIGH (ref 98–111)
Creatinine, Ser: 1.18 mg/dL (ref 0.61–1.24)
GFR, Estimated: 59 mL/min — ABNORMAL LOW (ref >60)
Glucose, Bld: 99 mg/dL (ref 70–99)
Potassium: 5.5 mmol/L — ABNORMAL HIGH (ref 3.5–5.1)
Sodium: 144 mmol/L (ref 135–145)
Total Bilirubin: 0.3 mg/dL (ref 0.0–1.2)
Total Protein: 5.5 g/dL — ABNORMAL LOW (ref 6.5–8.1)

## 2023-09-28 LAB — CBC
HCT: 24.5 % — ABNORMAL LOW (ref 39.0–52.0)
HCT: 24.7 % — ABNORMAL LOW (ref 39.0–52.0)
Hemoglobin: 7.4 g/dL — ABNORMAL LOW (ref 13.0–17.0)
Hemoglobin: 7.3 g/dL — ABNORMAL LOW (ref 13.0–17.0)
MCH: 26.5 pg (ref 26.0–34.0)
MCH: 26 pg (ref 26.0–34.0)
MCHC: 30.2 g/dL (ref 30.0–36.0)
MCHC: 29.6 g/dL — ABNORMAL LOW (ref 30.0–36.0)
MCV: 87.8 fL (ref 80.0–100.0)
MCV: 87.9 fL (ref 80.0–100.0)
Platelets: 205 10*3/uL (ref 150–400)
Platelets: 197 10*3/uL (ref 150–400)
RBC: 2.79 MIL/uL — ABNORMAL LOW (ref 4.22–5.81)
RBC: 2.81 MIL/uL — ABNORMAL LOW (ref 4.22–5.81)
RDW: 17 % — ABNORMAL HIGH (ref 11.5–15.5)
RDW: 17.2 % — ABNORMAL HIGH (ref 11.5–15.5)
WBC: 4.5 10*3/uL (ref 4.0–10.5)
WBC: 4.7 10*3/uL (ref 4.0–10.5)
nRBC: 0 % (ref 0.0–0.2)
nRBC: 0 % (ref 0.0–0.2)

## 2023-09-28 LAB — TYPE AND SCREEN
ABO/RH(D): B POS
Antibody Screen: NEGATIVE

## 2023-09-28 LAB — MAGNESIUM: Magnesium: 2 mg/dL (ref 1.7–2.4)

## 2023-09-28 MED ORDER — FREE WATER
300.0000 mL | Freq: Four times a day (QID) | Status: DC
  Administered 2023-09-28 – 2023-09-30 (×6): 300 mL

## 2023-09-28 MED ORDER — VITAMIN B-12 1000 MCG PO TABS
1000.0000 ug | ORAL_TABLET | Freq: Every day | ORAL | Status: DC
  Administered 2023-09-28 – 2023-09-30 (×2): 1000 ug via ORAL
  Filled 2023-09-28 (×3): qty 1

## 2023-09-28 NOTE — Telephone Encounter (Signed)
 [name] [name],   I called and spoke to [place] and she stated that she is currently out of office but is requesting a call back tomorrow to give report. Please contact Marcelino Duster tomorrow at [phone].

## 2023-09-28 NOTE — Progress Notes (Signed)
 Progress Note   Patient: [name] [name] ZOX:[id number] DOB: 1933/12/19 DOA: 09/20/2023     7 DOS: the patient was seen and examined on 09/28/2023 at 11:05 AM and 3:45 PM      Brief hospital course: 89 y.o. M with dementia, lives at home, bedbound, HTN, hypothyroidism, severe protein calorie malnutrition, PEG dependent who presented with having run out of tube feed formula.     Assessment and Plan: Possible pneumonia Chest x-ray on admission showed possible infiltrates.  He was treated with antibiotics, completed this course, and has had no further issues.  Procalcitonin negative, so possible this imaging findings is just scarring from old recurrent aspirations. - Repeat chest imaging recommended in 3 months    PEG tube dependent since 2023 Dysphagia Patient developed several months of difficulty swallowing in late 2023, was finally admitted with aspiration pneumonia.  Evaluated by GI and endoscopy was unremarkable.  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  Ultimately, PEG was recommended and placed.   Recently, the family were notified for the first time, that their tube feeds would no longer be paid for, and so they brought him back to the hospital for evaluation. - Consult TOC for assistance procuring tube feeds   Dementia Failure to thrive Severe protein calorie malnutrition Bedbound Patient has been bedbound since an episode of Salmonella bacteremia in Oct 2024.  His dementia has clearly worsened since his PEG placement, but he recognizes family, is oriented to "hospital" and can answer simple questions.  In the last 6 months, he has lost 4kg (7% of bodyweight) despite adherence to tube  feeds.  BMI now 15. - Continue tube feeds and free water   Syncope Echocardiogram, CT head unremarkable, EKG nonacute, troponin with mild elevation without ischemia.  Likely myocardial injury due to pneumonia.   Anemia of chronic disease and iron and B12 deficiency Hgb trended down from 9 to 7.  Due to iron deficiency and B12 deficiency - Start B12 and ferrous sulfate - Follow up with PCP   Hyperkalemia Mild    - Continue free water  Hypothyroidism - Continue levothyroxine  Hypertension - Continue amlodipine        Subjective: Evaluated with interpreter [id number], patient has no complaints.  No pain.  No nursing concerns.     Physical Exam: BP (!) 122/52 (BP Location: Left Arm)   Pulse 81   Temp 97.6 F (36.4 C) (Axillary)   Resp 18   Ht 6' (1.829 m)   Wt 51.1 kg   SpO2 99%   BMI 15.28 kg/m   Elderly adult male, appears frail and cachectic, lying in bed, but is alert, makes eye contact and responds to questions RRR, no murmurs, no peripheral edema Respiratory rate normal, lungs clear without rales or wheezes Abdomen soft without tenderness palpation or guarding Attentive to questions, at times, but at other times, does not respond to appropriate questions from the interpreter, tends to room ignore the interpreter and speak to me despite repeated recounting, he has generalized weakness of both upper extremities, and both legs are contracted, his speech is dysarthric    Data Reviewed: Basic metabolic panel shows mild hyperkalemia, stable renal function CBC shows hemoglobin down to 7.4, a single hemoglobin less than 7 is likely  spurious Creatinine stable    Family Communication: None at the bedside    Disposition: Status is: Inpatient         Author: Alberteen Sam, MD 09/28/2023 4:06 PM  For on call review [email or web address].

## 2023-09-28 NOTE — Plan of Care (Signed)
  Problem: Clinical Measurements: Goal: Cardiovascular complication will be avoided Outcome: Progressing   Problem: Nutrition: Goal: Adequate nutrition will be maintained Outcome: Progressing   Problem: Coping: Goal: Level of anxiety will decrease Outcome: Progressing   Problem: Pain Managment: Goal: General experience of comfort will improve and/or be controlled Outcome: Progressing   Problem: Safety: Goal: Ability to remain free from injury will improve Outcome: Progressing   Problem: Skin Integrity: Goal: Risk for impaired skin integrity will decrease Outcome: Progressing

## 2023-09-28 NOTE — Plan of Care (Signed)

## 2023-09-28 NOTE — Telephone Encounter (Unsigned)
 Copied from CRM [id number]. Topic: General - Other >> Sep 28, 2023  3:56 PM Geneva B wrote: Reason for [id number] [employer] case manager at [facility] cone please call is [phone]

## 2023-09-28 NOTE — TOC Progression Note (Addendum)
 Transition of Care [facility]) - Progression Note    Patient Details  Name: [name] [name] MRN: [id number] Date of Birth: 1934-01-12  Transition of Care [facility]) CM/SW Contact  Janae Bridgeman, RN Phone Number: 09/28/2023, 11:26 AM  Clinical Narrative:    CM called and spoke with Mickeal Needy, CM with [employer] and he was notified that [facility] leadership is willing to provide LOG for 30 days of Jevity and purchase of a hospital bed.  LOG contract was sent to [facility] and Gretta Cool, [facility] Supervisor was included in the email.  I spoke with Dr. Maryfrances Bunnell and he is aware that hospital bed will be delivered to the home in the next 1-2 days and patient's Jevity supply will be delivered to the home in the next 2-3 days.  I will have pharmacy provide 3 days of Jevity feeds to sent home with the patient when he is ready.  I called the patient's son and he plans to bring his 2024 W2 to my office and I will complete the [employer] application and refax it to [employer] with the corrections completed.  I explained to the son that the hospital can only provide only 30 days of Jevity tube feedings and that [employer] will determine patient's availability to financial assistance.  The son was updated that he will need to follow up with [employer] regarding his savings.   The son asked about availability to Medicaid.  Financial Counseling was following the patient.  Patient would not likely qualify for Medicaid and I updated the son and asked that he make an appointment with [facility] social services for follow up.  When hospital bed is delivered and Jevity tube feeds are confirmed - then patient will be discharged home with family.  MD was updated on the plan to return home once this is all in place.  09/28/2023 1534 - CM met with the son and the son provided the 2024 W2 and the updated tax forms and documents with [employer] were refaxed to the company for patient care assistance.  The son is aware.  Hospital bed was  delivered to the patient's home today.  I called and left a message with [employer] to follow up regarding delivery of Jevity to the home - shipment.  I also called and asked [employer] to see if they can provide the 3 days tube feeds until the shipment arrives.  Milas Gain will check on availability of delivery to the bedside.  The son states that he he received a message with social services about Medicaid application and son called her back and left a message to follow up.  I asked that the son look into a used wheelchair since the hospital provided the hospital bed.  Son plans to find one for the patient.  [name] was provide with tube feeding syrines, split gauze and tape.  The plan is to send the patient home by [facility] tomorrow.  I asked the son to speak with [facility] about virtual visits.  CM will continue to follow the patient for patient's discharge to home tomorrow by [facility].  09/28/2023 1557 - I called and left a message with Robyne Peers, RNCM with [facility] and Wellness and requested a call back so that she can assist the patient in the community. Patient will need transportation assistance to PCP appointments and/or virtual visits since patient is in a hospital bed in the home.     Expected Discharge Plan: Home/Self Care Barriers to Discharge: Continued Medical Work up  Expected Discharge Plan and Services In-house Referral: Clinical Social Work     Living arrangements for the past 2 months: Single Family Home                                       Social Determinants of Health (SDOH) Interventions SDOH Screenings   Food Insecurity: No Food Insecurity (09/21/2023)  Housing: Low Risk  (09/21/2023)  Transportation Needs: No Transportation Needs (09/21/2023)  Utilities: Not At Risk (09/21/2023)  Depression (PHQ2-9): Medium Risk (03/10/2022)  Social Connections: Moderately Integrated (09/21/2023)  Tobacco Use: Low Risk  (09/20/2023)    Readmission Risk Interventions      No data to display

## 2023-09-29 ENCOUNTER — Inpatient Hospital Stay (HOSPITAL_COMMUNITY): Payer: MEDICAID

## 2023-09-29 HISTORY — PX: IR REPLC GASTRO/COLONIC TUBE PERCUT W/FLUORO: IMG2333

## 2023-09-29 LAB — CBC
HCT: 25.7 % — ABNORMAL LOW (ref 39.0–52.0)
Hemoglobin: 7.6 g/dL — ABNORMAL LOW (ref 13.0–17.0)
MCH: 26 pg (ref 26.0–34.0)
MCHC: 29.6 g/dL — ABNORMAL LOW (ref 30.0–36.0)
MCV: 88 fL (ref 80.0–100.0)
Platelets: 204 10*3/uL (ref 150–400)
RBC: 2.92 MIL/uL — ABNORMAL LOW (ref 4.22–5.81)
RDW: 17.2 % — ABNORMAL HIGH (ref 11.5–15.5)
WBC: 5.2 10*3/uL (ref 4.0–10.5)
nRBC: 0 % (ref 0.0–0.2)

## 2023-09-29 MED ORDER — IOHEXOL 300 MG/ML  SOLN
50.0000 mL | Freq: Once | INTRAMUSCULAR | Status: AC | PRN
Start: 2023-09-29 — End: 2023-09-29
  Administered 2023-09-29: 10 mL

## 2023-09-29 MED ORDER — LIDOCAINE VISCOUS HCL 2 % MT SOLN
OROMUCOSAL | Status: AC
  Filled 2023-09-29: qty 15

## 2023-09-29 MED ORDER — DEXTROSE-SODIUM CHLORIDE 5-0.45 % IV SOLN: INTRAVENOUS | Status: AC

## 2023-09-29 MED ORDER — KCL IN DEXTROSE-NACL 20-5-0.45 MEQ/L-%-% IV SOLN: INTRAVENOUS | Status: DC

## 2023-09-29 NOTE — Progress Notes (Signed)
  Progress Note   Patient: [name] [name] ZOX:[id number] DOB: [birth year] DOA: 09/20/2023     8 DOS: the patient was seen and examined on 09/29/2023        Brief hospital course: 89 y.o. M with dementia, lives at home, bedbound, HTN, hypothyroidism, severe protein calorie malnutrition, PEG dependent who presented with having run out of tube feed formula.      Assessment and Plan: Possible pneumonia Treated and resolved  PEG tube dependence PEG tube fell out overnight accidentally - Replace PEG tube today by IR, appreciate assistance  Dementia - Continue tube feeds and free water  Anemia of chronic kidney disease, iron deficiency and B12 deficiency - Continue B12 and ferrous sulfate - Check CBC in 1 week  Hypothyroidism - Continue levothyroxine  Hypertension - Continue amlodipine         Subjective: Attempted to use interpreter, patient is not able to understand the questions posed by the interpreter, or engage with the video traumatic screen.  He does not appear to have any complaints.  Nursing have no concerns other than dislodged PEG tube this morning.  This was not observed and is presumably an accident.     Physical Exam: BP (!) 127/59 (BP Location: Right Arm)   Pulse 86   Temp 98.1 F (36.7 C) (Oral)   Resp 19   Ht 6' (1.829 m)   Wt 51.1 kg   SpO2 100%   BMI 15.28 kg/m   Frail elderly adult male, cachectic, lying in bed RRR, no murmurs, no peripheral edema Respiratory rate normal, lungs clear without rales or wheezes Abdomen soft without tenderness palpation or guarding   Data Reviewed: Discussed with general surgery, interventional radiology consulted CBC shows stable anemia  Family Communication: Son by phone    Disposition: Status is: Inpatient The patient was admitted because he ran out of tube feeds  While he was here, social work were able to secure tube feeds for a whole year.  Unfortunately, before he was discharged, his PEG fell out, and  so we will replace today and discharge tomorrow morning        Author: Alberteen Sam, MD 09/29/2023 3:50 PM  For on call review [email or web address].

## 2023-09-29 NOTE — Progress Notes (Signed)
 Placed abdominal binder on pt, so pt doesn't pull peg tube again.

## 2023-09-29 NOTE — Progress Notes (Signed)
 A new container of TF hung for patient @ 0520.

## 2023-09-29 NOTE — Hospital Course (Signed)
 89 y.o. M with dementia, lives at home, bedbound, HTN, hypothyroidism, severe protein calorie malnutrition, PEG dependent who presented with having run out of tube feed formula.

## 2023-09-29 NOTE — Consult Note (Signed)
 Chief Complaint: Displaced gastrostomy tube  Referring Provider(s): Joen Laura  Supervising Physician: Roanna Banning  Patient Status: [name] - In-pt  History of Present Illness: [name] [name] is a 89 y.o. male who has severe dysphagia and underwent placement of a gastrostomy tube by Dr. Corliss Skains on 04/06/22.  IR is consulted today for replacement after the was pulled out by the patient.  It appears the surgical tube was a 24 french.  I attempted to replace it at the bedside but the stoma appears to be closing down. I am unable to place an 18 fr.  Patient is Full Code  Past Medical History:  Diagnosis Date   Back pain 10/2008   per MRI 11/06/2008 - severe spinal stenosis worse at L4-5, L3-4, with neural compression at those levels   BPH (benign prostatic hyperplasia)    Eczema    Gout    Hypertension    Hypothyroidism     Past Surgical History:  Procedure Laterality Date   ESOPHAGOGASTRODUODENOSCOPY N/A 04/03/2022   Procedure: ESOPHAGOGASTRODUODENOSCOPY (EGD);  Surgeon: Jeani Hawking, MD;  Location[name] ENDOSCOPY;  Service: Gastroenterology;  Laterality: N/A;   LAMINECTOMY  12/11/2008    Bilateral L2, L3, and L4 laminotomies, done by Dr. Lovell Sheehan   LAPAROSCOPY N/A 04/06/2022   Procedure: LAPAROSCOPIC ASSISTED PERCUTANEOUS ENDOSCOPIC GASTROSTOMY PLACEMENT;  Surgeon: Manus Rudd, MD;  Location: [facility] OR;  Service: General;  Laterality: N/A;    Allergies: Pork-derived products  Medications: Prior to Admission medications   Medication Sig Start Date End Date Taking? Authorizing Provider  Iron, Ferrous Sulfate, 325 (65 Fe) MG TABS Take 1 tablet (325 mg) by mouth every other day. Please make an appointment with Dr. Laural Benes for more refills. Patient taking differently: Place 325 mg into feeding tube every other day. 04/10/23  Yes Marcine Matar, MD  levothyroxine (SYNTHROID) 75 MCG tablet Take 1 tablet (75 mcg total) by mouth daily before breakfast. 04/12/23  Yes  Marcine Matar, MD  allopurinol (ZYLOPRIM) 100 MG tablet Take 1 tablet (100 mg total) by mouth daily. Patient not taking: Reported on 09/20/2023 06/16/23   Marcine Matar, MD  metoprolol tartrate (LOPRESSOR) 25 MG tablet Place 0.5 tablets (12.5 mg total) into feeding tube 2 (two) times daily. STOP BISOPROLOL Patient not taking: Reported on 09/20/2023 05/05/23   Marcine Matar, MD  saccharomyces boulardii (FLORASTOR) 250 MG capsule Place 1 capsule (250 mg total) into feeding tube 2 (two) times daily. Patient not taking: Reported on 09/20/2023 04/23/23   Leatha Gilding, MD  Water For Irrigation, Sterile (FREE WATER) SOLN 50 mL free water flush via PEG tube before and after each bolus 04/08/22   Ghimire, Werner Lean, MD     Family History  Problem Relation Age of Onset   Stroke Neg Hx    Cancer Neg Hx     Social History   Socioeconomic History   Marital status: Married    Spouse name: Not on file   Number of children: Not on file   Years of education: Not on file   Highest education level: Not on file  Occupational History   Not on file  Tobacco Use   Smoking status: Never   Smokeless tobacco: Never  Vaping Use   Vaping status: Never Used  Substance and Sexual Activity   Alcohol use: No   Drug use: No   Sexual activity: Not Currently  Other Topics Concern   Not on file  Social History Narrative   Not  on file   Social Drivers of Health   Financial Resource Strain: Not on file  Food Insecurity: No Food Insecurity (09/21/2023)   Hunger Vital Sign    Worried About Running Out of Food in the Last Year: Never true    Ran Out of Food in the Last Year: Never true  Transportation Needs: No Transportation Needs (09/21/2023)   PRAPARE - Administrator, Civil Service (Medical): No    Lack of Transportation (Non-Medical): No  Physical Activity: Not on file  Stress: Not on file  Social Connections: Moderately Integrated (09/21/2023)   Social Connection and  Isolation Panel [NHANES]    Frequency of Communication with Friends and Family: Once a week    Frequency of Social Gatherings with Friends and Family: More than three times a week    Attends Religious Services: 1 to 4 times per year    Active Member of [employer] or Organizations: No    Attends Engineer, structural: Never    Marital Status: Married     Review of Systems  Unable to perform ROS: Mental status change    Vital Signs: BP (!) 127/59 (BP Location: Right Arm)   Pulse 86   Temp 98.1 F (36.7 C) (Oral)   Resp 19   Ht 6' (1.829 m)   Wt 112 lb 10.5 oz (51.1 kg)   SpO2 100%   BMI 15.28 kg/m   Advance Care Plan: The advanced care place/surrogate decision maker was discussed at the time of visit and the patient did not wish to discuss or was not able to name a surrogate decision maker or provide an advance care plan.  Physical Exam Constitutional:      Comments: Awake  HENT:     Head: Normocephalic.  Cardiovascular:     Rate and Rhythm: Normal rate.  Pulmonary:     Effort: Pulmonary effort is normal. No respiratory distress.  Abdominal:     Palpations: Abdomen is soft.     Tenderness: There is no abdominal tenderness.     Comments: Gtube site with hypergranulation tissue present, appears to be closing quickly. Unable to replace tube at bedside.  Musculoskeletal:     Comments: Contracted  Skin:    General: Skin is warm and dry.     Labs:  CBC: Recent Labs    09/25/23 1239 09/26/23 0400 09/28/23 1000 09/28/23 1149  WBC 6.0 5.6 4.5 4.7  HGB 7.2* 7.4* 7.4* 7.3*  HCT 24.8* 25.1* 24.5* 24.7*  PLT 234 225 205 197    COAGS: No results for input(s): "INR", "APTT" in the last 8760 hours.  BMP: Recent Labs    09/24/23 0239 09/25/23 0327 09/26/23 0400 09/28/23 0723  NA 144 143 144 144  K 4.1 4.2 4.6 5.5*  CL 113* 114* 111 112*  CO2 26 23 25 27   GLUCOSE 108* 127* 107* 99  BUN 56* 48* 48* 46*  CALCIUM 8.7* 8.6* 8.9 8.9  CREATININE 1.22 1.10 1.08  1.18  GFRNONAA 57* >60 >60 59*    LIVER FUNCTION TESTS: Recent Labs    04/19/23 0311 09/20/23 1733 09/21/23 0526 09/28/23 0723  BILITOT 0.3 0.4 0.4 0.3  AST 30 30 24 27   ALT 20 21 18 21   ALKPHOS 59 62 57 41  PROT 6.9 7.7 7.4 5.5*  ALBUMIN 2.6* 2.3* 2.2* 1.6*    TUMOR MARKERS: No results for input(s): "AFPTM", "CEA", "CA199", "CHROMGRNA" in the last 8760 hours.  Assessment and Plan:  Inadvertently dislodged  gastrostomy tube (originally placed by Surgery 04/06/22- 24 Fr ?)  Will proceed with replacement today in IR by Dr. Milford Cage.  Risks and benefits image guided gastrostomy tube replacement was discussed with the patient's son including, but not limited to the need for a barium enema during the procedure, bleeding, infection, peritonitis and/or damage to adjacent structures.  All questions were answered, agreeable to proceed.  Consent signed and in chart.   Thank you for allowing our service to participate in Christopherjohn Schiele 's care.  Electronically Signed: Gwynneth Macleod, PA-C   09/29/2023, 11:13 AM      I spent a total of 20 Minutes  in face to face in clinical consultation, greater than 50% of which was counseling/coordinating care for gtube replacement.

## 2023-09-29 NOTE — Progress Notes (Signed)
 This nurse went to give pt meds and assess pt. The nurse noticed pts peg tube was in the bed not hooked up. PT had pulled peg tube out. MD has been notified.

## 2023-09-29 NOTE — Progress Notes (Addendum)
 Physical Therapy Treatment Patient Details Name: Willi Borowiak MRN: [id number] DOB: [birth year] Today's Date: 09/29/2023   History of Present Illness Pt is an 89 y.o. male admitted from home 3/26 with PNA and FTT. PMH: dementia, eczema,  HTN, hypothyroidism, BPH, normocytic anemia, gout, venous insufficiency, dysphagia with PEG placement 03/2022    PT Comments  Pt with significant progression with mobility this session. Pt following gestural commands for movement of all extremities, assisting with transfers and able to stand and pivot to chair with mod assist. Pt demonstrates use of call bell with instruction and can extend hips and knees although grossly 10 degrees of extension limited in bil hips and knees. Will continue to follow to progress mobility and encourage mobility with nursing. May do well with stedy.   If plan is discharge home, recommend the following: Two people to help with walking and/or transfers;Help with stairs or ramp for entrance;Assist for transportation;Direct supervision/assist for medications management;Assistance with feeding;Supervision due to cognitive status;Direct supervision/assist for financial management;Assistance with cooking/housework;A lot of help with bathing/dressing/bathroom   Can travel by private vehicle     No  Equipment Recommendations  Wheelchair (measurements PT);Wheelchair cushion (measurements PT);Hospital bed;Hoyer lift    Recommendations for Other Services       Precautions / Restrictions Precautions Precautions: Fall Recall of Precautions/Restrictions: Impaired Precaution/Restrictions Comments: PEG (not currently in), decub ulcer L hip     Mobility  Bed Mobility Overal bed mobility: Needs Assistance Bed Mobility: Rolling, Supine to Sit, Sit to Supine Rolling: Min assist   Supine to sit: Min assist Sit to supine: Contact guard assist   General bed mobility comments: pt able to roll bil with min assist and use of rail with gestural cues,  min assist to elevate trunk from surface x 2 trials, CGA to return to supine    Transfers Overall transfer level: Needs assistance   Transfers: Sit to/from Stand, Bed to chair/wheelchair/BSC Sit to Stand: Mod assist Stand pivot transfers: Max assist         General transfer comment: mod assist to rise from bed x total of 5 trials with pt maintaining hip and knee flexion but able to rise with assist and static stand up to 30 sec prior to fatigue. max assist for stand pivot as pt unable to step effectively and needs assist to control balance and momentum    Ambulation/Gait               General Gait Details: deferred   Stairs             Wheelchair Mobility     Tilt Bed    Modified Rankin (Stroke Patients Only)       Balance Overall balance assessment: Needs assistance Sitting-balance support: No upper extremity supported, Feet supported Sitting balance-Leahy Scale: Fair Sitting balance - Comments: pt able to sit EOB with supervision grossly 6 min   Standing balance support: Bilateral upper extremity supported Standing balance-Leahy Scale: Zero Standing balance comment: UB support on therapist with face to face technique                            Communication Communication Communication: Impaired Factors Affecting Communication: Non - English speaking, interpreter not available;Reduced clarity of speech  Cognition Arousal: Alert Behavior During Therapy: Flat affect   PT - Cognitive impairments: History of cognitive impairments, No family/caregiver present to determine baseline  PT - Cognition Comments: pt following gestural commands well and responds to basic English appropriately even demonstrating use of call bell, no family present Following commands: Impaired Following commands impaired: Follows one step commands with increased time    Cueing Cueing Techniques: Gestural cues, Tactile cues, Verbal cues   Exercises General Exercises - Upper Extremity Shoulder Flexion: AAROM, Both, Seated, 10 reps Elbow Flexion: AROM, Both, 10 reps, Seated Elbow Extension: AROM, Both, Seated, 10 reps General Exercises - Lower Extremity Long Arc Quad: AROM, Both, 10 reps, Seated Hip Flexion/Marching: AROM, Both, 10 reps, Seated    General Comments        Pertinent Vitals/Pain Pain Assessment Faces Pain Scale: Hurts a little bit Pain Location: grimace with pivot but then states "ok" Pain Descriptors / Indicators: Grimacing Pain Intervention(s): Monitored during session, Repositioned    Home Living                          Prior Function            PT Goals (current goals can now be found in the care plan section) Acute Rehab PT Goals Time For Goal Achievement: 10/13/23 Potential to Achieve Goals: Fair Progress towards PT goals: Progressing toward goals;Goals met and updated - see care plan    Frequency    Min 1X/week      PT Plan      Co-evaluation              AM-PAC PT "6 Clicks" Mobility   Outcome Measure  Help needed turning from your back to your side while in a flat bed without using bedrails?: A Lot Help needed moving from lying on your back to sitting on the side of a flat bed without using bedrails?: A Lot Help needed moving to and from a bed to a chair (including a wheelchair)?: A Lot Help needed standing up from a chair using your arms (e.g., wheelchair or bedside chair)?: A Lot Help needed to walk in hospital room?: Total Help needed climbing 3-5 steps with a railing? : Total 6 Click Score: 10    End of Session   Activity Tolerance: Patient tolerated treatment well Patient left: in chair;with call bell/phone within reach;with chair alarm set Nurse Communication: Mobility status (+2 stand pivot) PT Visit Diagnosis: Other abnormalities of gait and mobility (R26.89);Muscle weakness (generalized) (M62.81);Adult, failure to thrive (R62.7)     Time:  1610-9604 PT Time Calculation (min) (ACUTE ONLY): 23 min  Charges:    $Therapeutic Activity: 23-37 mins PT General Charges $$ ACUTE PT VISIT: 1 Visit                     Merryl Hacker, PT Acute Rehabilitation Services Office: [phone]    Enedina Finner Mirelle Biskup 09/29/2023, 11:43 AM

## 2023-09-29 NOTE — Plan of Care (Signed)

## 2023-09-29 NOTE — Progress Notes (Signed)
 Pt in IR for PEG tube replacement.

## 2023-09-29 NOTE — Progress Notes (Signed)
 Mobility Specialist: Progress Note   09/29/23 1309  Mobility  Activity Transferred from chair to bed  Level of Assistance +2 (takes two people)  Assistive Device Stedy  Activity Response Tolerated fair  Mobility Referral Yes  Mobility visit 1 Mobility  Mobility Specialist Start Time (ACUTE ONLY) 1250  Mobility Specialist Stop Time (ACUTE ONLY) 1305  Mobility Specialist Time Calculation (min) (ACUTE ONLY) 15 min    Pt received in chair. Initially not receptive to translator and not following commands from MS but more communicative as session progressed. ModA +2 for stedy, pt having issues standing upright and control LE in the foot platform. SV for bed mobility once EOB. Left in bed with all needs met, call bell in reach.   Maurene Capes Mobility Specialist Please contact via SecureChat or Rehab office at [phone]

## 2023-09-29 NOTE — TOC Progression Note (Addendum)
 Transition of Care [name]) - Progression Note    Patient Details  Name: [name] [name] MRN: [id number] Date of Birth: December 07, 1933  [facility] CM/SW Contact  Janae Bridgeman, RN Phone Number: 09/29/2023, 10:18 AM  Clinical Narrative:    CM spoke with the charge RN this morning and the patient's enteral tube was dislodged this morning.  Dr. Maryfrances Bunnell is aware.  I called and spoke with the patient's son by phone and made him aware that the patient would not discharge home today until the tube placement is corrected.  Patient will need an abdominal binder placed over the tube during hospital stay and when he is able to discharge to home.  I called [employer] and spoke with Willaim Sheng, CM with [employer] at [phone] and the patient has been approved for 12 months of tube feedings that are being shipped to the home.  Dr. Maryfrances Bunnell was updated.   I called and left a message with RNCM and [facility] for follow up so that she can continue to follow the patient in the community when he is discharged back home with family.  The son was provided with address and contact for [employer] to find any other needed used DME included a used wheelchair.  Patient was provided with purchased hospital bed from the hospital.  Patient remains inpatient until G-tube is re-inserted.  PTAR will need to be arranged for home.  09/29/23 1455 - Patient's g-tube is being replaced today.  Dr. Maryfrances Bunnell spoke with the son and he is aware that patient will be discharged home by [name] tomorrow.    Bedside nursing ordered 2 abdominal binders - one to wear and one to take home.  I called [employer] and tube feeding supplies for 3 days are sent to the home and available when he returns home tomorrow.  PTAR will be arranged for home tomorrow.   Expected Discharge Plan: Home/Self Care Barriers to Discharge: Continued Medical Work up  Expected Discharge Plan and Services In-house Referral: Clinical Social Work     Living  arrangements for the past 2 months: Single Family Home                                       Social Determinants of Health (SDOH) Interventions SDOH Screenings   Food Insecurity: No Food Insecurity (09/21/2023)  Housing: Low Risk  (09/21/2023)  Transportation Needs: No Transportation Needs (09/21/2023)  Utilities: Not At Risk (09/21/2023)  Depression (PHQ2-9): Medium Risk (03/10/2022)  Social Connections: Moderately Integrated (09/21/2023)  Tobacco Use: Low Risk  (09/20/2023)    Readmission Risk Interventions     No data to display

## 2023-09-30 ENCOUNTER — Other Ambulatory Visit (HOSPITAL_COMMUNITY): Payer: Self-pay

## 2023-09-30 MED ORDER — ADULT MULTIVITAMIN W/MINERALS CH: 1.0000 | ORAL_TABLET | Freq: Every day | ORAL | Status: DC

## 2023-09-30 MED ORDER — JEVITY 1.5 CAL/FIBER PO LIQD: 1440.0000 mL | ORAL | Status: DC

## 2023-09-30 MED ORDER — FREE WATER: 300.0000 mL | Freq: Four times a day (QID) | Status: DC

## 2023-09-30 MED ORDER — CYANOCOBALAMIN 100 MCG PO TABS
100.0000 ug | ORAL_TABLET | Freq: Every day | ORAL | 3 refills | Status: DC
  Filled 2023-09-30: qty 90, 90d supply, fill #0

## 2023-09-30 MED ORDER — CYANOCOBALAMIN 1000 MCG PO TABS
1000.0000 ug | ORAL_TABLET | Freq: Every day | ORAL | 3 refills | Status: DC
  Filled 2023-09-30: qty 90, 90d supply, fill #0

## 2023-09-30 MED ORDER — AMLODIPINE BESYLATE 5 MG PO TABS
5.0000 mg | ORAL_TABLET | Freq: Every day | ORAL | 3 refills | Status: DC
  Filled 2023-09-30: qty 90, 90d supply, fill #0

## 2023-09-30 NOTE — Plan of Care (Signed)
  Problem: Health Behavior/Discharge Planning: Goal: Ability to manage health-related needs will improve Outcome: Progressing   Problem: Clinical Measurements: Goal: Ability to maintain clinical measurements within normal limits will improve Outcome: Progressing Goal: Will remain free from infection Outcome: Progressing Goal: Diagnostic test results will improve Outcome: Progressing Goal: Respiratory complications will improve Outcome: Progressing Goal: Cardiovascular complication will be avoided Outcome: Progressing   Problem: Activity: Goal: Risk for activity intolerance will decrease Outcome: Progressing   Problem: Nutrition: Goal: Adequate nutrition will be maintained Outcome: Progressing   Problem: Coping: Goal: Level of anxiety will decrease Outcome: Progressing   Problem: Elimination: Goal: Will not experience complications related to bowel motility Outcome: Progressing Goal: Will not experience complications related to urinary retention Outcome: Progressing   Problem: Pain Managment: Goal: General experience of comfort will improve and/or be controlled Outcome: Progressing   Problem: Safety: Goal: Ability to remain free from injury will improve Outcome: Progressing   Problem: Skin Integrity: Goal: Risk for impaired skin integrity will decrease Outcome: Progressing   Problem: Activity: Goal: Ability to tolerate increased activity will improve Outcome: Progressing   Problem: Clinical Measurements: Goal: Ability to maintain a body temperature in the normal range will improve Outcome: Progressing

## 2023-09-30 NOTE — Discharge Summary (Signed)
 Physician Discharge Summary   Patient: [name] [name] MRN: [id number] DOB: 1933/10/08  Admit date:     09/20/2023  Discharge date: 09/30/23  Discharge Physician: Alberteen Sam   PCP: Marcine Matar, MD     Recommendations at discharge:  Follow up with PCP Dr. Laural Benes for failure to thrive, dementia, anemia Dr. Laural Benes: Please check CBC when able to arrange Check B12 and iron levels in 4-8 weeks Repeat CXR in 8 weeks if able to arrange Please provide palliative care services outpatient, suspect patient has less than 6 months prognosis Please be aware, [facility] pharmacy will need to assist family to renew [employer] PAP for Jevity prior to 09/26/2024 pending patient survival     Discharge Diagnoses: Principal Problem:   Failure to thrive Active Problems:   Multifocal pneumonia   Hypothyroidism   Anemia, chronic disease   Essential hypertension   Hyperkalemia   Dysphagia   Aspiration pneumonia (HCC)   Elevated troponin   Protein-calorie malnutrition, severe   PEG (percutaneous endoscopic gastrostomy) status (HCC)   Fall at home, initial encounter   Failure to thrive in adult   Dehydration   Dementia (HCC)   Decubitus skin ulcer      Hospital Course: 89 y.o. M with dementia, lives at home, bedbound, HTN, hypothyroidism, severe protein calorie malnutrition, PEG dependent who presented with having run out of tube feed formula.   Evidently, the patient's family had been receiving tube feed formula for 18 months from a home health agency, and were unaware of how this was being paid for.  Recently, they were notified that his feeds were being canceled/no longer reimbursed, so they brought him to the ER.       Possible pneumonia Chest x-ray on admission showed possible infiltrates.  He was treated with antibiotics, completed this course, and has had no further issues.  Procalcitonin negative, so possible this imaging findings is just scarring from old recurrent  aspirations. - Repeat chest imaging recommended in 3 months       PEG tube dependent since 2023 Dysphagia Patient developed several months of difficulty swallowing in late 2023, was finally admitted with aspiration pneumonia.   Evaluated by GI and endoscopy was unremarkable.  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   Ultimately, PEG was recommended and placed.  It appears that in the course of goals of care discussions, no one at the time verified that the patient had a means to procure tube feeds.   As above, they were being delivered monthly for 18 months until recently, the family were notified for the first time, that their tube feeds would no longer be paid for, and so they brought him back to the hospital for evaluation.  Thankfully, TOC staff at the hospital, were able to complete a patient assistance program application for the family with [employer], and [employer] confirmed 88 years fulfillment of free tube feeds prior to discharge.      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  His dementia has clearly worsened since his PEG placement, but he recognizes family, is oriented to "hospital" and can answer simple questions.   In the last 6 months, he  has lost 4kg (7% of bodyweight) despite adherence to tube feeds.  BMI now 15.  I discussed his trajectory with family, and my opinion that he only has a few months to live.  Family aware.  They do not have insurance for palliative care services outpatient.    Of note, patient accidentally dislodged his PEG tube the morning of discharge.  This was  replaced and discharge was delayed 1 day.     Syncope Echocardiogram, CT head unremarkable, EKG nonacute, troponin with mild elevation without ischemia.  Likely myocardial injury due to pneumonia.   Anemia of chronic disease and iron and B12 deficiency Hgb trended down from 9 to 7.  Due to iron deficiency and B12 deficiency  Started on B12 and ferrous sulfate - Follow up with PCP    Hyperkalemia Instructions for free water provided to family.   Hypothyroidism TSH normal on levothyroxine   Hypertension Metoprolol changed to amlodipine          [facility] Controlled Substances Registry was reviewed for this patient prior to discharge.  Disposition: Home   DISCHARGE MEDICATION: Allergies as of 09/30/2023       Reactions   Pork-derived Products Other (See Comments)   Religious Reasons        Medication List     STOP taking these medications    allopurinol 100 MG tablet Commonly known as: ZYLOPRIM   metoprolol tartrate 25 MG tablet Commonly known as: LOPRESSOR   saccharomyces boulardii 250 MG capsule Commonly known as: FLORASTOR       TAKE these medications    amLODipine 5 MG tablet Commonly known as: NORVASC Crush 1 tablet (5 mg total) and dissolve in a small amount of water to give by feeding tube daily.   cyanocobalamin 1000 MCG tablet Commonly known as: VITAMIN B12 Crush 1 tablet (1,000 mcg total) and dissolve in a small amount of water to give by feeding tube daily.   feeding supplement (JEVITY 1.5 CAL/FIBER) Liqd Place 1,440 mLs into feeding tube continuous.   FeroSul 325 (65 Fe) MG tablet Generic drug: ferrous sulfate Take 1 tablet (325 mg) by mouth every other day. Please make an appointment with Dr. Laural Benes for more refills. What changed: how to take this   free water Soln Place 300 mLs into feeding tube every 6 (six) hours. What changed:  how much to take how to take this when to take this additional instructions    levothyroxine 75 MCG tablet Commonly known as: SYNTHROID Take 1 tablet (75 mcg total) by mouth daily before breakfast.   multivitamin with minerals Tabs tablet Take 1 tablet by mouth daily.               Durable Medical Equipment  (From admission, onward)           Start     Ordered   09/28/23 1623  For home use only DME Tube feeding  Once       Comments: Patient needs 3 days of Jevity 1.5 tube feeding delivered to the home today - 09/28/2023 in the evening   09/28/23 1624   09/26/23 1403  For home use only DME Hospital bed  Once       Question Answer Comment  Length of Need Lifetime   Patient has (list medical condition): PNA, stage 3 pressure injury,  FTT( tube feed)   The above medical condition requires: Patient requires the ability to reposition frequently   Head must be elevated greater  than: 30 degrees   Bed type Semi-electric   Support Surface: Low Air loss Mattress      09/26/23 1406              Discharge Care Instructions  (From admission, onward)           Start     Ordered   09/30/23 0000  Discharge wound care:       Comments: Apply Xeroform on the wound bed, change daily. Cover with foam dressing, change every 3 days or PRN. Apply foam dressing on knees, sacrum and heels to prevent further injuries, and on bony areas.   09/30/23 0828            Follow-up Information     [name] Follow up.   Why: [employer] will provide you with a Hospital bed.  They also plan to deliver 30 days of Jevity tube feedings until [employer] follows up with you regarding patient assistance through the company. Contact information: [address] [place] [address] [phone]         [facility] Follow up.   Why: Call for a telemedicine virtual appointment Contact information: [address] [employer] [address] [place] [address] [phone]                 Discharge Instructions     Discharge instructions   Complete by: As directed    IMPORTANT:  Continue tube feeds at 60 ml/hr continuously ALSO: important to add water to the flushes 250-300 mL every 6 hours  START the blood pressure medicine amlodipine and check your blood pressure at home The top number should be between 105 and 135  START iron supplements (ferrous sulfate), vitamin B12 supplements (cyanocobalamin 100 mcg) and a multivitamin  CONTINUE levothyroxine (the thyroid supplement)  Call the [facility] clinic at [address] to arrange a virtual appointment  for follow up   Discharge wound care:   Complete by: As directed    Apply Xeroform on the wound bed, change daily. Cover with foam dressing, change every 3 days or PRN. Apply foam dressing on knees, sacrum and heels to prevent further injuries, and on bony areas.   Increase activity slowly   Complete by: As directed        Discharge Exam: Filed Weights   09/21/23 0000 09/21/23 1442 09/22/23 0624  Weight: 52.3 kg 54.1 kg 51.1 kg    General: Pt is alert, awake, not in acute distress, lying in bed, appears cachectic Cardiovascular: RRR, nl S1-S2, no murmurs appreciated.   No LE edema.   Respiratory: Normal respiratory rate and rhythm.  CTAB without rales or wheezes. Abdominal: Abdomen soft and non-tender.  No distension or HSM.   Neuro/Psych: Strength symmetrically weak, contractured in legs, arms weak and listless.  Mumbles repsonses to questions, ignores other questions.      Condition at discharge: Poor  The results of significant diagnostics from this hospitalization (including imaging, microbiology, ancillary and laboratory) are listed below for reference.   Imaging Studies: IR Replc Gastro/Colonic Tube Percut W/Fluoro Result Date: 09/29/2023 INDICATION: Feeding tube dependent. Surgically-placed gastrostomy tube on 04/06/2022. Catheter malfunction. Dislodged. EXAM: FLUOROSCOPIC GUIDED  REPLACEMENT OF GASTROSTOMY TUBE COMPARISON:  KUB, 09/24/2023. MEDICATIONS: None. CONTRAST:  10mL OMNIPAQUE IOHEXOL 300 MG/ML SOLN - administered into the gastric lumen FLUOROSCOPY TIME:  Fluoroscopic dose; 0.3 mGy COMPLICATIONS: None immediate. PROCEDURE: Informed written consent was obtained from the patient  and/or patient's representative after a discussion of the risks, benefits and alternatives to treatment. Questions regarding the procedure were encouraged and answered. A timeout was performed prior to the initiation of the procedure. The upper abdomen and external portion of the existing gastrostomy tube was prepped and draped in the usual sterile fashion, and a sterile drape was applied covering the operative field. Maximum barrier sterile technique with sterile gowns and gloves were used for the procedure. A timeout was performed prior to the initiation of the procedure. The existing gastrostomy tube was injected with a small amount of contrast confirming appropriate positioning within the gastric lumen. The external portion of the gastrostomy tube was cut decompressing the retention balloon. Next, the gastrostomy tube was cannulated with a stiff Glidewire which was coiled within the gastric fundus. Over the stiff guide wire, the existing gastrostomy tube was exchanged for a new 16 Fr balloon inflatable gastrostomy tube. The balloon was inflated with saline and dilute contrast and pulled against the anterior inner lumen of the stomach and the external disc was cinched. Contrast was injected and a post procedural spot fluoroscopic image was obtained confirming appropriate positioning and functionality of the new gastrostomy tube. A dressing was applied. The patient tolerated the procedure well without immediate postprocedural complication. Procedure cyst by Theodoro Kos, IR RT IMPRESSION: Successful fluoroscopic guided replacement of a new 16 Fr balloon-inflatable gastrostomy tube. The gastrostomy tube is  ready for immediate use. RECOMMENDATIONS: The patient will return to V[facility]iology (VIR) for routine feeding tube evaluation and exchange in 6 months. Roanna Banning, MD [facility] Electronically Signed   By: Roanna Banning M.D.   On: 09/29/2023 18:25   DG ABDOMEN PEG TUBE LOCATION Result Date: 09/24/2023 CLINICAL DATA:  Gastrostomy tube replacement. EXAM: ABDOMEN - 1 VIEW COMPARISON:  CT chest, abdomen, and pelvis dated September 20, 2023. FINDINGS: 3 mm Gastrografin injected through the gastrostomy tube demonstrates normal opacification of the stomach. No definite contrast extravasation. Normal bowel gas pattern. IMPRESSION: 1. Gastrostomy tube within the stomach. Electronically Signed   By: Obie Dredge M.D.   On: 09/24/2023 12:58   CT HEAD WO CONTRAST ( ) Result Date: 09/24/2023 CLINICAL DATA:  Unwitnessed fall with headache EXAM: CT HEAD WITHOUT CONTRAST TECHNIQUE: Contiguous axial images were obtained from the base of the skull through the vertex without intravenous contrast. RADIATION DOSE REDUCTION: This exam was performed according to the departmental dose-optimization program which includes automated exposure control, adjustment of the mA and/or kV according to patient size and/or use of iterative reconstruction technique. COMPARISON:  Four days prior FINDINGS: Brain: No evidence of acute infarction, hemorrhage, hydrocephalus, extra-axial collection or mass lesion/mass effect. Generalized atrophy with chronic small vessel ischemia. Vascular: No hyperdense vessel or unexpected calcification. Skull: Normal. Negative for fracture or focal lesion. Sinuses/Orbits: No acute finding. IMPRESSION: No acute finding.  No evidence of intracranial injury. Electronically Signed   By: Tiburcio Pea M.D.   On: 09/24/2023 12:17   ECHOCARDIOGRAM COMPLETE Result Date: 09/23/2023    ECHOCARDIOGRAM REPORT   Patient Name:   JAMEEK BRUNTZ  Date of  Exam: 09/23/2023 Medical Rec #:  [id number]  Height:       72.0 in Accession #:    [id number] Weight:       112.7 lb Date of Birth:  Mar 17, 1934   BSA:          1.669 m Patient Age:    89 years   BP:  121/73 mmHg Patient Gender: M          HR:           65 bpm. Exam Location:  Inpatient Procedure: 2D Echo, Color Doppler and Cardiac Doppler (Both Spectral and Color            Flow Doppler were utilized during procedure). Indications:    Elevated Troponin  History:        Patient has prior history of Echocardiogram examinations, most                 recent 04/17/2023.  Sonographer:    Harriette Bouillon RDCS Referring Phys: [id number] ANASTASSIA DOUTOVA IMPRESSIONS  1. Left ventricular ejection fraction, by estimation, is 60 to 65%. The left ventricle has normal function. The left ventricle has no regional wall motion abnormalities. There is mild concentric left ventricular hypertrophy. Left ventricular diastolic parameters are consistent with Grade I diastolic dysfunction (impaired relaxation).  2. Right ventricular systolic function is normal. The right ventricular size is normal.  3. The mitral valve is normal in structure. Trivial mitral valve regurgitation. No evidence of mitral stenosis.  4. The aortic valve is tricuspid. Aortic valve regurgitation is not visualized. Aortic valve sclerosis is present, with no evidence of aortic valve stenosis. IVC not well visualzied Comparison(s): No significant change from prior study. Prior images reviewed side by side. Suboptimal imaging windows in this study; patient physicaly unable to maneuver for optimization. FINDINGS  Left Ventricle: Left ventricular ejection fraction, by estimation, is 60 to 65%. The left ventricle has normal function. The left ventricle has no regional wall motion abnormalities. Strain was performed and the global longitudinal strain is indeterminate. The left ventricular internal cavity size was small. There is mild concentric left ventricular  hypertrophy. Left ventricular diastolic parameters are consistent with Grade I diastolic dysfunction (impaired relaxation). Right Ventricle: The right ventricular size is normal. No increase in right ventricular wall thickness. Right ventricular systolic function is normal. Left Atrium: Left atrial size was normal in size. Right Atrium: Right atrial size was normal in size. Pericardium: Trivial pericardial effusion is present. The pericardial effusion is posterior to the left ventricle. Mitral Valve: The mitral valve is normal in structure. Trivial mitral valve regurgitation. No evidence of mitral valve stenosis. Tricuspid Valve: The tricuspid valve is normal in structure. Tricuspid valve regurgitation is not demonstrated. No evidence of tricuspid stenosis. Aortic Valve: The aortic valve is tricuspid. Aortic valve regurgitation is not visualized. Aortic valve sclerosis is present, with no evidence of aortic valve stenosis. Pulmonic Valve: The pulmonic valve was not well visualized. Pulmonic valve regurgitation is not visualized. Aorta: The aortic root is normal in size and structure and the ascending aorta was not well visualized. IAS/Shunts: The interatrial septum was not well visualized. Additional Comments: 3D was performed not requiring image post processing on an independent workstation and was indeterminate.  LEFT VENTRICLE PLAX 2D LVIDd:         3.90 cm   Diastology LVIDs:         2.60 cm   LV e' medial:    5.77 cm/s LV PW:         1.20 cm   LV E/e' medial:  13.2 LV IVS:        1.30 cm   LV e' lateral:   6.96 cm/s LVOT diam:     2.10 cm   LV E/e' lateral: 10.9 LV SV:         77 LV SV Index:  46 LVOT Area:     3.46 cm  RIGHT VENTRICLE RV S prime:     13.30 cm/s TAPSE (M-mode): 1.6 cm LEFT ATRIUM             Index        RIGHT ATRIUM           Index LA diam:        2.90 cm 1.74 cm/m   RA Area:     10.70 cm LA Vol (A2C):   42.9 ml 25.70 ml/m  RA Volume:   22.40 ml  13.42 ml/m LA Vol (A4C):   35.7 ml 21.39  ml/m LA Biplane Vol: 40.4 ml 24.20 ml/m  AORTIC VALVE LVOT Vmax:   113.00 cm/s LVOT Vmean:  78.200 cm/s LVOT VTI:    0.223 m  AORTA Ao Root diam: 3.00 cm MITRAL VALVE MV Area (PHT): 2.75 cm    SHUNTS MV Decel Time: 276 msec    Systemic VTI:  0.22 m MV E velocity: 76.00 cm/s  Systemic Diam: 2.10 cm MV A velocity: 81.20 cm/s MV E/A ratio:  0.94 Riley Lam MD Electronically signed by Riley Lam MD Signature Date/Time: 09/23/2023/1:03:51 PM    Final    CT Head Wo Contrast Result Date: 09/20/2023 CLINICAL DATA:  Altered mental status EXAM: CT HEAD WITHOUT CONTRAST TECHNIQUE: Contiguous axial images were obtained from the base of the skull through the vertex without intravenous contrast. RADIATION DOSE REDUCTION: This exam was performed according to the departmental dose-optimization program which includes automated exposure control, adjustment of the mA and/or kV according to patient size and/or use of iterative reconstruction technique. COMPARISON:  04/16/2019 FINDINGS: Brain: No evidence of acute infarction, hemorrhage, hydrocephalus, extra-axial collection or mass lesion/mass effect. Cavum septum pellucidum is again seen, normal variant. Scattered atrophic and chronic white matter ischemic changes are noted. Vascular: No hyperdense vessel or unexpected calcification. Skull: Normal. Negative for fracture or focal lesion. Sinuses/Orbits: Mild mucosal changes are noted in the sphenoid and ethmoid sinuses. Other: None IMPRESSION: Chronic atrophic and ischemic changes are noted. Electronically Signed   By: Alcide Clever M.D.   On: 09/20/2023 22:25   CT Cervical Spine Wo Contrast Result Date: 09/20/2023 CLINICAL DATA:  Neck trauma (Age >= 65y); Facial trauma, blunt EXAM: CT MAXILLOFACIAL WITHOUT CONTRAST CT CERVICAL SPINE WITHOUT CONTRAST TECHNIQUE: Multidetector CT imaging of the maxillofacial structures was performed. Multiplanar CT image reconstructions were also generated. A small metallic BB  was placed on the right temple in order to reliably differentiate right from left. Multidetector CT imaging of the cervical spine was performed without intravenous contrast. Multiplanar CT image reconstructions were also generated. RADIATION DOSE REDUCTION: This exam was performed according to the departmental dose-optimization program which includes automated exposure control, adjustment of the mA and/or kV according to patient size and/or use of iterative reconstruction technique. COMPARISON:  CT head 09/20/2023, CT head 04/01/2022, CT head 01/02/2017. FINDINGS: CT MAXILLOFACIAL FINDINGS Osseous: No fracture or mandibular dislocation. No destructive process. 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 Orbits: Negative. No traumatic or inflammatory finding. Sinuses: Bilateral sphenoid, left ethmoid, left maxillary, left frontal mucosal thickening. Paranasal sinuses otherwise clear. Mastoid air cells are clear. Some interval sclerosis of the left mastoid temporal bone. Soft tissues: Negative. Limited intracranial: Please see separately dictated CT head 09/20/2023. CT CERVICAL FINDINGS Alignment: Normal. Skull base and vertebrae: Multilevel severe degenerative changes of the spine. No associated severe osseous neural foraminal or central canal stenosis. No acute  fracture. No aggressive appearing focal osseous lesion or focal pathologic process. Soft tissues and spinal canal: No prevertebral fluid or swelling. No visible canal hematoma. Upper chest: Unremarkable. Other: Atherosclerotic plaque of the carotid arteries within the neck. IMPRESSION: 1. No acute displaced facial fracture. 2. No acute displaced fracture or traumatic listhesis of the cervical spine. Electronically Signed   By: Tish Frederickson M.D.   On: 09/20/2023 21:47   CT Maxillofacial WO CM Result Date: 09/20/2023 CLINICAL DATA:  Neck trauma (Age >= 65y); Facial trauma, blunt EXAM: CT MAXILLOFACIAL WITHOUT  CONTRAST CT CERVICAL SPINE WITHOUT CONTRAST TECHNIQUE: Multidetector CT imaging of the maxillofacial structures was performed. Multiplanar CT image reconstructions were also generated. A small metallic BB was placed on the right temple in order to reliably differentiate right from left. Multidetector CT imaging of the cervical spine was performed without intravenous contrast. Multiplanar CT image reconstructions were also generated. RADIATION DOSE REDUCTION: This exam was performed according to the departmental dose-optimization program which includes automated exposure control, adjustment of the mA and/or kV according to patient size and/or use of iterative reconstruction technique. COMPARISON:  CT head 09/20/2023, CT head 04/01/2022, CT head 01/02/2017. FINDINGS: CT MAXILLOFACIAL FINDINGS Osseous: No fracture or mandibular dislocation. No destructive process. 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 Orbits: Negative. No traumatic or inflammatory finding. Sinuses: Bilateral sphenoid, left ethmoid, left maxillary, left frontal mucosal thickening. Paranasal sinuses otherwise clear. Mastoid air cells are clear. Some interval sclerosis of the left mastoid temporal bone. Soft tissues: Negative. Limited intracranial: Please see separately dictated CT head 09/20/2023. CT CERVICAL FINDINGS Alignment: Normal. Skull base and vertebrae: Multilevel severe degenerative changes of the spine. No associated severe osseous neural foraminal or central canal stenosis. No acute fracture. No aggressive appearing focal osseous lesion or focal pathologic process. Soft tissues and spinal canal: No prevertebral fluid or swelling. No visible canal hematoma. Upper chest: Unremarkable. Other: Atherosclerotic plaque of the carotid arteries within the neck. IMPRESSION: 1. No acute displaced facial fracture. 2. No acute displaced fracture or traumatic listhesis of the cervical spine. Electronically  Signed   By: Tish Frederickson M.D.   On: 09/20/2023 21:47   CT CHEST ABDOMEN PELVIS W CONTRAST Result Date: 09/20/2023 CLINICAL DATA:  Sepsis EXAM: CT CHEST, ABDOMEN, AND PELVIS WITH CONTRAST TECHNIQUE: Multidetector CT imaging of the chest, abdomen and pelvis was performed following the standard protocol during bolus administration of intravenous contrast. RADIATION DOSE REDUCTION: This exam was performed according to the departmental dose-optimization program which includes automated exposure control, adjustment of the mA and/or kV according to patient size and/or use of iterative reconstruction technique. CONTRAST:  75mL OMNIPAQUE IOHEXOL 350 MG/ML SOLN COMPARISON:  CT abdomen pelvis 04/16/2023, CT angiography chest 12/10/2021 FINDINGS: CHEST: Cardiovascular: No aortic injury. The thoracic aorta is normal in caliber. The heart is normal in size. No significant pericardial effusion. No central or segmental pulmonary embolus. Limited evaluation more distally due to timing of contrast. The main pulmonary artery measures at the upper limits of normal. At least moderate atherosclerotic plaque of the aorta. Four-vessel coronary calcification. Mediastinum/Nodes: No pneumomediastinum. No mediastinal hematoma. The esophagus is unremarkable. The thyroid is unremarkable. The central airways are patent. No mediastinal, hilar, or axillary lymphadenopathy. Lungs/Pleura: Emphysematous changes. Patchy left upper lobe and left lower lobe airspace opacities. Similar finding within the right lower lobe. Stable several scattered pulmonary micronodules-no further follow-up indicated. Debris within the left lower lobe bronchials (25:41). No pulmonary mass. No pulmonary contusion or laceration.  No pneumatocele formation. No pleural effusion. No pneumothorax. No hemothorax. Musculoskeletal/Chest wall: No chest wall mass. No acute rib or sternal fracture.Diffusely decreased bone density. Multilevel severe degenerative changes of the  spine. Chronic multilevel mild thoracic vertebral body height loss. Severe degenerative changes of the shoulders. Old nonunionized fracture of the left humeral neck. Old chronic left glenohumeral joint subluxation. ABDOMEN / PELVIS: Hepatobiliary: Not enlarged. No focal lesion. No laceration or subcapsular hematoma. The gallbladder is otherwise unremarkable with no radio-opaque gallstones. No biliary ductal dilatation. Pancreas: Normal pancreatic contour. No main pancreatic duct dilatation. Spleen: Not enlarged. No focal lesion. No laceration, subcapsular hematoma, or vascular injury. Adrenals/Urinary Tract: No nodularity bilaterally. Bilateral kidneys enhance symmetrically. No hydronephrosis. No contusion, laceration, or subcapsular hematoma. No injury to the vascular structures or collecting systems. No hydroureter. The urinary bladder is unremarkable. Stomach/Bowel: Gastrostomy tube with tip and inflated balloon terminating within the gastric lumen. No small or large bowel wall thickening or dilatation. Increased stool burden throughout the colon. A 7 cm rectal stool ball. The appendix is unremarkable. Vasculature/Lymphatics: No abdominal aorta or iliac aneurysm. No active contrast extravasation or pseudoaneurysm. No abdominal, pelvic, inguinal lymphadenopathy. Reproductive: Prostate enlarged measuring up to 5.4 cm. Question retractile bilateral testis noted along the inguinal canal. Other: No simple free fluid ascites. No pneumoperitoneum. No hemoperitoneum. No mesenteric hematoma identified. No organized fluid collection. Musculoskeletal: No significant soft tissue hematoma. No acute pelvic fracture. No spinal fracture. Diffusely decreased bone density. Multilevel severe degenerative changes of the spine. L4-L5 posterolateral interbody surgical hardware fusion. No CT evidence of surgical hardware complication. Other ports and devices: None. IMPRESSION: 1. Multifocal pneumonia with debris noted within the left  lower bronchials. Recommend follow-up CT in 3 months to evaluate for resolution exclude underlying masses. 2. No central or segmental pulmonary embolus. Limited evaluation more distally due to timing of contrast. 3. Constipation with 7 cm rectal stool ball. 4. Gastrostomy tube in good position. 5. Aortic Atherosclerosis (ICD10-I70.0) including four-vessel coronary calcification. 6.  Emphysema (ICD10-J43.9). 7. Question retractile bilateral testis noted along the inguinal canal. Recommend correlation with physical exam. Electronically Signed   By: Tish Frederickson M.D.   On: 09/20/2023 21:34   DG Chest Port 1 View Result Date: 09/20/2023 CLINICAL DATA:  Syncope and subsequent fall. EXAM: PORTABLE CHEST 1 VIEW COMPARISON:  April 16, 2023 FINDINGS: The heart size and mediastinal contours are within normal limits. The lungs are hyperinflated. Mild areas of scarring, atelectasis and/or infiltrate are seen within the mid left lung and right lung base. No pleural effusion or pneumothorax is identified. A chronic left humeral neck fracture is noted. Multilevel degenerative changes are seen throughout the thoracic spine. IMPRESSION: Mild mid left lung and right basilar scarring, atelectasis and/or infiltrate. Electronically Signed   By: Aram Candela M.D.   On: 09/20/2023 19:04    Microbiology: Results for orders placed or performed during the hospital encounter of 09/20/23  Culture, blood (Routine X 2) w Reflex to ID Panel     Status: None   Collection Time: 09/20/23  5:33 PM   Specimen: BLOOD RIGHT ARM  Result Value Ref Range Status   Specimen Description BLOOD RIGHT ARM  Final   Special Requests   Final    BOTTLES DRAWN AEROBIC AND ANAEROBIC Blood Culture results may not be optimal due to an inadequate volume of blood received in culture bottles   Culture   Final    NO GROWTH 5 DAYS Performed at [facility], [address], [place], [place] [address]    Report Status 09/25/2023 FINAL  Final   Culture, blood (Routine X 2) w Reflex to ID Panel     Status: None   Collection Time: 09/20/23  5:34 PM   Specimen: BLOOD  Result Value Ref Range Status   Specimen Description BLOOD SITE NOT SPECIFIED  Final   Special Requests   Final    BOTTLES DRAWN AEROBIC AND ANAEROBIC Blood Culture results may not be optimal due to an inadequate volume of blood received in culture bottles   Culture   Final    NO GROWTH 5 DAYS Performed at [facility], [address] [place], [place] [address]    Report Status 09/25/2023 FINAL  Final  Resp panel by RT-PCR (RSV, Flu A&B, Covid) Anterior Nasal Swab     Status: None   Collection Time: 09/20/23 11:38 PM   Specimen: Anterior Nasal Swab  Result Value Ref Range Status   SARS Coronavirus 2 by RT PCR NEGATIVE NEGATIVE Final   Influenza A by PCR NEGATIVE NEGATIVE Final   Influenza B by PCR NEGATIVE NEGATIVE Final    Comment: (NOTE) The Xpert Xpress SARS-CoV-2/FLU/RSV plus assay is intended as an aid in the diagnosis of influenza from Nasopharyngeal swab specimens and should not be used as a sole basis for treatment. Nasal washings and aspirates are unacceptable for Xpert Xpress SARS-CoV-2/FLU/RSV testing.  Fact Sheet for Patients: BloggerCourse.com  Fact Sheet for Healthcare Providers: SeriousBroker.it  This test is not yet approved or cleared by the [place] FDA and has been authorized for detection and/or diagnosis of SARS-CoV-2 by FDA under an Emergency Use Authorization (EUA). This EUA will remain in effect (meaning this test can be used) for the duration of the COVID-19 declaration under Section 564(b)(1) of the Act, 21 U.S.C. section 360bbb-3(b)(1), unless the authorization is terminated or revoked.     Resp Syncytial Virus by PCR NEGATIVE NEGATIVE Final    Comment: (NOTE) Fact Sheet for Patients: BloggerCourse.com  Fact Sheet for Healthcare  Providers: SeriousBroker.it  This test is not yet approved or cleared by the [place] FDA and has been authorized for detection and/or diagnosis of SARS-CoV-2 by FDA under an Emergency Use Authorization (EUA). This EUA will remain in effect (meaning this test can be used) for the duration of the COVID-19 declaration under Section 564(b)(1) of the Act, 21 U.S.C. section 360bbb-3(b)(1), unless the authorization is terminated or revoked.  Performed at [facility], [address], [place], [place] [address]   Respiratory (~20 pathogens) panel by PCR     Status: None   Collection Time: 09/21/23  8:41 AM   Specimen: Nasopharyngeal Swab; Respiratory  Result Value Ref Range Status   Adenovirus NOT DETECTED NOT DETECTED Final   Coronavirus 229E NOT DETECTED NOT DETECTED Final    Comment: (NOTE) The Coronavirus on the Respiratory Panel, DOES NOT test for the novel  Coronavirus (2019 nCoV)    Coronavirus HKU1 NOT DETECTED NOT DETECTED Final   Coronavirus NL63 NOT DETECTED NOT DETECTED Final   Coronavirus OC43 NOT DETECTED NOT DETECTED Final   Metapneumovirus NOT DETECTED NOT DETECTED Final   Rhinovirus / Enterovirus NOT DETECTED NOT DETECTED Final   Influenza A NOT DETECTED NOT DETECTED Final   Influenza B NOT DETECTED NOT DETECTED Final   Parainfluenza Virus 1 NOT DETECTED NOT DETECTED Final   Parainfluenza Virus 2 NOT DETECTED NOT DETECTED Final   Parainfluenza Virus 3 NOT DETECTED NOT DETECTED Final  Parainfluenza Virus 4 NOT DETECTED NOT DETECTED Final   Respiratory Syncytial Virus NOT DETECTED NOT DETECTED Final   Bordetella pertussis NOT DETECTED NOT DETECTED Final   Bordetella Parapertussis NOT DETECTED NOT DETECTED Final   Chlamydophila pneumoniae NOT DETECTED NOT DETECTED Final   Mycoplasma pneumoniae NOT DETECTED NOT DETECTED Final    Comment: Performed at [name], [address], [place], [place] [address]     Labs: CBC: Recent Labs  Lab 09/25/23 1239 09/26/23 0400 09/28/23 1000 09/28/23 1149 09/29/23 1033  WBC 6.0 5.6 4.5 4.7 5.2  HGB 7.2* 7.4* 7.4* 7.3* 7.6*  HCT 24.8* 25.1* 24.5* 24.7* 25.7*  MCV 87.9 87.8 87.8 87.9 88.0  PLT 234 225 205 197 204   Basic Metabolic Panel: Recent Labs  Lab 09/24/23 0239 09/25/23 0327 09/26/23 0400 09/27/23 0603 09/28/23 0723  NA 144 143 144  --  144  K 4.1 4.2 4.6  --  5.5*  CL 113* 114* 111  --  112*  CO2 26 23 25   --  27  GLUCOSE 108* 127* 107*  --  99  BUN 56* 48* 48*  --  46*  CREATININE 1.22 1.10 1.08  --  1.18  CALCIUM 8.7* 8.6* 8.9  --  8.9  MG 2.1 2.0 2.0 2.0 2.0  PHOS 3.3 3.1 2.6 3.3  --    Liver Function Tests: Recent Labs  Lab 09/28/23 0723  AST 27  ALT 21  ALKPHOS 41  BILITOT 0.3  PROT 5.5*  ALBUMIN 1.6*   CBG: No results for input(s): "GLUCAP" in the last 168 hours.  Discharge time spent: approximately 25 minutes spent on discharge counseling, evaluation of patient on day of discharge, and coordination of discharge planning with nursing, social work, pharmacy and case management  Signed: Alberteen Sam, MD [facility] 09/30/2023

## 2023-09-30 NOTE — Progress Notes (Signed)
 Son has been notified on PT's discharge orders. Made him aware PTAR has been set up, just unsure when they will arrive.

## 2023-09-30 NOTE — Progress Notes (Signed)
 Pt is ready to be D/C. PTAR transportation arranged. Notified pt's son, Godfrey Pick and verified home address ([address] [place], [place] [address]).

## 2023-10-01 ENCOUNTER — Emergency Department (HOSPITAL_COMMUNITY): Payer: Self-pay

## 2023-10-01 ENCOUNTER — Other Ambulatory Visit: Payer: Self-pay

## 2023-10-01 ENCOUNTER — Emergency Department (HOSPITAL_COMMUNITY)
Admission: EM | Admit: 2023-10-01 | Discharge: 2023-10-01 | Disposition: A | Discharge status: Home / Self Care | Payer: Self-pay

## 2023-10-01 ENCOUNTER — Encounter (HOSPITAL_COMMUNITY): Payer: Self-pay | Admitting: *Deleted

## 2023-10-01 DIAGNOSIS — K9423 Gastrostomy malfunction: Secondary | ICD-10-CM | POA: Insufficient documentation

## 2023-10-01 DIAGNOSIS — F0390 Unspecified dementia without behavioral disturbance: Secondary | ICD-10-CM | POA: Insufficient documentation

## 2023-10-01 DIAGNOSIS — T85528A Displacement of other gastrointestinal prosthetic devices, implants and grafts, initial encounter: Secondary | ICD-10-CM

## 2023-10-01 MED ORDER — DIATRIZOATE MEGLUMINE & SODIUM 66-10 % PO SOLN
30.0000 mL | Freq: Once | ORAL | Status: AC
  Administered 2023-10-01: 30 mL
  Filled 2023-10-01: qty 30

## 2023-10-01 NOTE — ED Notes (Signed)
PTAR called no ETA

## 2023-10-01 NOTE — ED Notes (Signed)
 Pt to radiology.

## 2023-10-01 NOTE — ED Notes (Signed)
Secretary Licensed conveyancer.

## 2023-10-01 NOTE — Discharge Instructions (Signed)
 Your G-tube was replaced here.  You can use this as needed.  Follow-up with your doctor.  Return to the ER for worsening symptoms.

## 2023-10-01 NOTE — ED Provider Notes (Signed)
 [place] EMERGENCY DEPARTMENT AT [name] Provider Note   CSN: [id number] Arrival date & time: 10/01/23  1054     History  Chief Complaint  Patient presents with   g-tube dislodgement    [name] [name] is a 89 y.o. male.  89 year old male with past medical history of dementia and Norvasc who is feeding tube dependent presenting to the emergency department today after his PEG tube became dislodged.  The patient's son states that he went to check on him this morning and when he went and he noted that the feeding tube had come out.  This was just replaced a few days ago.  It was a 42 [place] feeding tube.  The patient has been getting tube feeds at least since October looking back through his chart.        Home Medications Prior to Admission medications   Medication Sig Start Date End Date Taking? Authorizing Provider  amLODipine (NORVASC) 5 MG tablet Crush 1 tablet (5 mg total) and dissolve in a small amount of water to give by feeding tube daily. 09/30/23   Danford, Earl Lites, MD  cyanocobalamin 1000 MCG tablet Crush 1 tablet (1,000 mcg total) and dissolve in a small amount of water to give by feeding tube daily. 09/30/23   Danford, Earl Lites, MD  Iron, Ferrous Sulfate, 325 (65 Fe) MG TABS Take 1 tablet (325 mg) by mouth every other day. Please make an appointment with Dr. Laural Benes for more refills. Patient taking differently: Place 325 mg into feeding tube every other day. 04/10/23   Marcine Matar, MD  levothyroxine (SYNTHROID) 75 MCG tablet Take 1 tablet (75 mcg total) by mouth daily before breakfast. 04/12/23   Marcine Matar, MD  Multiple Vitamin (MULTIVITAMIN WITH MINERALS) TABS tablet Take 1 tablet by mouth daily. 09/30/23   Danford, Earl Lites, MD  Nutritional Supplements (FEEDING SUPPLEMENT, JEVITY 1.5 CAL/FIBER,) LIQD Place 1,440 mLs into feeding tube continuous. 09/30/23   Danford, Earl Lites, MD  Water For Irrigation, Sterile (FREE WATER) SOLN Place  300 mLs into feeding tube every 6 (six) hours. 09/30/23   Danford, Earl Lites, MD      Allergies    Pork-derived products    Review of Systems   Review of Systems  Reason unable to perform ROS: Dementia.  All other systems reviewed and are negative.   Physical Exam Updated Vital Signs BP 127/64 (BP Location: Right Arm)   Pulse 82   Temp 98.5 F (36.9 C) (Oral)   Resp 18   Ht 6' (1.829 m)   Wt 51.1 kg   SpO2 100%   BMI 15.28 kg/m  Physical Exam Vitals and nursing note reviewed.   Gen: NAD, chronically ill appearing, contracted Eyes: PERRL, EOMI HEENT: no oropharyngeal swelling Neck: trachea midline Resp: clear to auscultation bilaterally Card: RRR, no murmurs, rubs, or gallops Abd: nontender, nondistended, stoma in upper abdomen noted with no drainage noted Extremities: no calf tenderness, no edema Vascular: 2+ radial pulses bilaterally, 2+ DP pulses bilaterally Skin: no rashes Psyc: acting appropriately   ED Results / Procedures / Treatments   Labs (all labs ordered are listed, but only abnormal results are displayed) Labs Reviewed - No data to display  EKG None  Radiology DG ABDOMEN PEG TUBE LOCATION Result Date: 10/01/2023 CLINICAL DATA:  Fifty-six gastrostomy. EXAM: ABDOMEN - 1 VIEW COMPARISON:  Abdominal radiograph dated 09/24/2023. FINDINGS: Percutaneous gastrostomy with balloon in the region of the body of the stomach. Contrast injected opacifies  the stomach and proximal small bowel. No extraluminal contrast noted. No bowel dilatation. Degenerative changes of the spine. The no acute osseous pathology. IMPRESSION: Percutaneous gastrostomy with balloon in the region of the body of the stomach. Electronically Signed   By: Elgie Collard M.D.   On: 10/01/2023 13:39   IR Replc Gastro/Colonic Tube Percut W/Fluoro Result Date: 09/29/2023 INDICATION: Feeding tube dependent. Surgically-placed gastrostomy tube on 04/06/2022. Catheter malfunction. Dislodged. EXAM:  FLUOROSCOPIC GUIDED REPLACEMENT OF GASTROSTOMY TUBE COMPARISON:  KUB, 09/24/2023. MEDICATIONS: None. CONTRAST:  10mL OMNIPAQUE IOHEXOL 300 MG/ML SOLN - administered into the gastric lumen FLUOROSCOPY TIME:  Fluoroscopic dose; 0.3 mGy COMPLICATIONS: None immediate. PROCEDURE: Informed written consent was obtained from the patient and/or patient's representative after a discussion of the risks, benefits and alternatives to treatment. Questions regarding the procedure were encouraged and answered. A timeout was performed prior to the initiation of the procedure. The upper abdomen and external portion of the existing gastrostomy tube was prepped and draped in the usual sterile fashion, and a sterile drape was applied covering the operative field. Maximum barrier sterile technique with sterile gowns and gloves were used for the procedure. A timeout was performed prior to the initiation of the procedure. The existing gastrostomy tube was injected with a small amount of contrast confirming appropriate positioning within the gastric lumen. The external portion of the gastrostomy tube was cut decompressing the retention balloon. Next, the gastrostomy tube was cannulated with a stiff Glidewire which was coiled within the gastric fundus. Over the stiff guide wire, the existing gastrostomy tube was exchanged for a new 16 Fr balloon inflatable gastrostomy tube. The balloon was inflated with saline and dilute contrast and pulled against the anterior inner lumen of the stomach and the external disc was cinched. Contrast was injected and a post procedural spot fluoroscopic image was obtained confirming appropriate positioning and functionality of the new gastrostomy tube. A dressing was applied. The patient tolerated the procedure well without immediate postprocedural complication. Procedure cyst by Theodoro Kos, IR RT IMPRESSION: Successful fluoroscopic guided replacement of a new 16 Fr balloon-inflatable gastrostomy tube. The  gastrostomy tube is ready for immediate use. RECOMMENDATIONS: The patient will return to Vascular Interventional Radiology (VIR) for routine feeding tube evaluation and exchange in 6 months. Roanna Banning, MD [name] Radiology Electronically Signed   By: Roanna Banning M.D.   On: 09/29/2023 18:25    Procedures .Gastrostomy tube replacement  Date/Time: 10/01/2023 1:44 PM  Performed by: Durwin Glaze, MD Authorized by: Durwin Glaze, MD  Consent: Verbal consent obtained. Risks and benefits: risks, benefits and alternatives were discussed Time out: Immediately prior to procedure a "time out" was called to verify the correct patient, procedure, equipment, support staff and site/side marked as required. Preparation: Patient was prepped and draped in the usual sterile fashion. Local anesthesia used: no  Anesthesia: Local anesthesia used: no  Sedation: Patient sedated: no  Comments: 75F tube placed        Medications Ordered in ED Medications  diatrizoate meglumine-sodium (GASTROGRAFIN) 66-10 % solution 30 mL (30 mLs Per Tube Given 10/01/23 1310)    ED Course/ Medical Decision Making/ A&P                                 Medical Decision Making 89 year old male with past medical history of dementia who is feeding tube dependent presents the emergency department today after his G-tube  became dislodged at some point overnight.  It appears that his stoma may have closed.  I will attempt to replace this here at bedside.  If not we will discuss his case with interventional radiology.  I did replace the patient's G-tube.  Tube study shows this to be in correct position.  Amount and/or Complexity of Data Reviewed Radiology: ordered.  Risk Prescription drug management.           Final Clinical Impression(s) / ED Diagnoses Final diagnoses:  Dislodged gastrostomy tube    Rx / DC Orders ED Discharge Orders     None          Durwin Glaze, MD 10/01/23 1345

## 2023-10-01 NOTE — ED Notes (Signed)
 Family, son [name] [name] notified that pt is on his way with PTAR.

## 2023-10-01 NOTE — ED Triage Notes (Signed)
 Pt here from home (where his son takes care of him).  Pt was discharged from [name] yesterday after having G-tube placed. Pt removed tube during the night.    Son states the last G-tube the pt had placed was sutured in.

## 2023-10-02 ENCOUNTER — Other Ambulatory Visit: Payer: Self-pay

## 2023-10-04 ENCOUNTER — Other Ambulatory Visit: Payer: Self-pay

## 2023-10-04 ENCOUNTER — Telehealth: Payer: Self-pay

## 2023-10-04 NOTE — Telephone Encounter (Signed)
 If family wants virtual and can set up Mychart, that would be fine.

## 2023-10-04 NOTE — Transitions of Care (Post Inpatient/ED Visit) (Signed)
 10/04/2023  Name: [name] [name] MRN: [id number] DOB: Jun 19, 1934  Today's TOC FU Call Status: Today's TOC FU Call Status:: Successful TOC FU Call Completed TOC FU Call Complete Date: 10/04/23 Patient's Name and Date of Birth confirmed.  Transition Care Management Follow-up Telephone Call Date of Discharge: 09/30/23 Discharge Facility: [facility]) Type of Discharge: Inpatient Admission Primary Inpatient Discharge Diagnosis:: multifocal pneumonia - returned to ED 10/01/2023 with dislodged PEG How have you been since you were released from the hospital?: Better (His son, Godfrey Pick, stated he is doing " wonderful."  He said that everything is " under control" and that calling to check on his father is very kind.) Any questions or concerns?: No Godfrey Pick did not have any questions. I asked about Medicaid and if he has been in contact with the DSS caseworkers and he said he dropped off paperwork yesterday for Julianne Handler, DSS caseworker.  He is now waiting to find out if his father will be approved.)  Items Reviewed: Did you receive and understand the discharge instructions provided?: Yes Medications obtained,verified, and reconciled?: Partial Review Completed Reason for Partial Mediation Review: Godfrey Pick said that they have all of the medications and they are doing fine. He did not have any questions about the med regime Any new allergies since your discharge?: No Dietary orders reviewed?: Yes Type of Diet Ordered:: Jevity 1.5 via PEG. the orders on AVS state for feedings @ 60 ml/hour continuously but Godfrey Pick said that they give 2 containers of formula 6x/day. They do not have a pump.  He also said that they give the free water as instructed: 300 ml every 6 hours via PEG.  he did not have any questions about the feedings and said that everything is fine. Do you have support at home?: Yes People in Home [RPT]: child(ren), adult Name of Support/Comfort Primary Source: His son and daughter in law are the  primary caregivers.  Medications Reviewed Today: Medications Reviewed Today   Medications were not reviewed in this encounter     Home Care and Equipment/Supplies: Were Home Health Services Ordered?: No Any new equipment or medical supplies ordered?: Yes Name of Medical supply agency?: The hospital paid for a hospital bed for him as well as a month of Jevity formula and he has been approved for free formula from [employer] Program for 1 year. Were you able to get the equipment/medical supplies?: Yes Godfrey Pick was extremely appreciative of the assistance that has been provided in caring for his father and getting him the supplies that he needs) Do you have any questions related to the use of the equipment/supplies?: No  Functional Questionnaire: Do you need assistance with bathing/showering or dressing?: Yes Godfrey Pick and his wife provide any needed personal care. Godfrey Pick said they learned alot about how to properly clean him and turn him while he was in the hospital) Do you need assistance with meal preparation?: Yes (NPO- family administers PEG feedings.) Do you need assistance with eating?: Yes (NPO- tibe feedings only) Do you have difficulty maintaining continence: Yes Do you need assistance with getting out of bed/getting out of a chair/moving?: Yes (HIs father is bedbound and Godfrey Pick said that he and his wife are able to turn the patient, they received a lot of instruction from the hospital staff.  We talked about positioning him to relieve pressure on sacrum, bony areas and Godfrey Pick said he understood) Do you have difficulty managing or taking your medications?: Yes Godfrey Pick and his wife manage the medications)  Follow up appointments reviewed: PCP Follow-up appointment confirmed?: Yes Date of PCP follow-up appointment?: 10/04/23 Follow-up Provider: Dr Laural Benes.  Will need to schedule as a virtual appointment Specialist Hospital Follow-up appointment confirmed?: NA Do  you need transportation to your follow-up appointment?: Yes Transportation Need Intervention Addressed By:: Other: (He would need PTAR if virtual visit is not possible.  Family does not have the means to pay for PTAR and patient is uninsured.  The clinic would need to pay for any transportation that is needed) Do you understand care options if your condition(s) worsen?: Yes-patient verbalized understanding    SIGNATURE Robyne Peers, RN

## 2023-10-04 NOTE — Telephone Encounter (Signed)
 I spoke to [employer] RN and she explained that the hospital paid for a month of tube feeding formula and she confirmed with [employer] that they have  approved the patient for a year of tube feedings through their patient assistance program. She also stated that the hospital purchased him a hospital bed and she provided the son with information about obtaining a wheelchair through [employer].  She said it will be very difficult for the family to get the patient out of the house to appointments and she is requesting virtual visits with PCP.  The patient has no insurance for wheelchair transportation,

## 2023-10-04 NOTE — Telephone Encounter (Signed)
 noted

## 2023-10-06 ENCOUNTER — Inpatient Hospital Stay (HOSPITAL_COMMUNITY)
Admission: EM | Admit: 2023-10-06 | Discharge: 2023-10-09 | DRG: 871 | Disposition: A | Discharge status: Home / Self Care | Payer: MEDICAID | Attending: Internal Medicine | Admitting: Internal Medicine

## 2023-10-06 ENCOUNTER — Other Ambulatory Visit: Payer: Self-pay

## 2023-10-06 ENCOUNTER — Encounter (HOSPITAL_COMMUNITY): Payer: Self-pay | Admitting: Emergency Medicine

## 2023-10-06 ENCOUNTER — Emergency Department (HOSPITAL_COMMUNITY): Payer: MEDICAID

## 2023-10-06 DIAGNOSIS — R64 Cachexia: Secondary | ICD-10-CM | POA: Diagnosis present

## 2023-10-06 DIAGNOSIS — N400 Enlarged prostate without lower urinary tract symptoms: Secondary | ICD-10-CM | POA: Diagnosis present

## 2023-10-06 DIAGNOSIS — E871 Hypo-osmolality and hyponatremia: Secondary | ICD-10-CM | POA: Diagnosis present

## 2023-10-06 DIAGNOSIS — Z931 Gastrostomy status: Secondary | ICD-10-CM

## 2023-10-06 DIAGNOSIS — Z681 Body mass index (BMI) 19 or less, adult: Secondary | ICD-10-CM

## 2023-10-06 DIAGNOSIS — Z79890 Hormone replacement therapy (postmenopausal): Secondary | ICD-10-CM

## 2023-10-06 DIAGNOSIS — R1310 Dysphagia, unspecified: Secondary | ICD-10-CM | POA: Diagnosis present

## 2023-10-06 DIAGNOSIS — R627 Adult failure to thrive: Secondary | ICD-10-CM | POA: Diagnosis present

## 2023-10-06 DIAGNOSIS — E440 Moderate protein-calorie malnutrition: Secondary | ICD-10-CM | POA: Diagnosis present

## 2023-10-06 DIAGNOSIS — A419 Sepsis, unspecified organism: Principal | ICD-10-CM | POA: Diagnosis present

## 2023-10-06 DIAGNOSIS — Z91014 Allergy to mammalian meats: Secondary | ICD-10-CM

## 2023-10-06 DIAGNOSIS — I10 Essential (primary) hypertension: Secondary | ICD-10-CM | POA: Diagnosis present

## 2023-10-06 DIAGNOSIS — Z8616 Personal history of COVID-19: Secondary | ICD-10-CM

## 2023-10-06 DIAGNOSIS — Z1152 Encounter for screening for COVID-19: Secondary | ICD-10-CM

## 2023-10-06 DIAGNOSIS — E538 Deficiency of other specified B group vitamins: Secondary | ICD-10-CM | POA: Diagnosis present

## 2023-10-06 DIAGNOSIS — D509 Iron deficiency anemia, unspecified: Secondary | ICD-10-CM | POA: Diagnosis present

## 2023-10-06 DIAGNOSIS — Z79899 Other long term (current) drug therapy: Secondary | ICD-10-CM

## 2023-10-06 DIAGNOSIS — E039 Hypothyroidism, unspecified: Secondary | ICD-10-CM | POA: Diagnosis present

## 2023-10-06 DIAGNOSIS — J690 Pneumonitis due to inhalation of food and vomit: Secondary | ICD-10-CM | POA: Diagnosis present

## 2023-10-06 LAB — COMPREHENSIVE METABOLIC PANEL WITH GFR
ALT: 26 U/L (ref 0–44)
AST: 32 U/L (ref 15–41)
Albumin: 2.2 g/dL — ABNORMAL LOW (ref 3.5–5.0)
Alkaline Phosphatase: 68 U/L (ref 38–126)
Anion gap: 13 (ref 5–15)
BUN: 37 mg/dL — ABNORMAL HIGH (ref 8–23)
CO2: 20 mmol/L — ABNORMAL LOW (ref 22–32)
Calcium: 9.2 mg/dL (ref 8.9–10.3)
Chloride: 101 mmol/L (ref 98–111)
Creatinine, Ser: 1.18 mg/dL (ref 0.61–1.24)
GFR, Estimated: 59 mL/min — ABNORMAL LOW (ref >60)
Glucose, Bld: 91 mg/dL (ref 70–99)
Potassium: 4.8 mmol/L (ref 3.5–5.1)
Sodium: 134 mmol/L — ABNORMAL LOW (ref 135–145)
Total Bilirubin: 0.4 mg/dL (ref 0.0–1.2)
Total Protein: 7.3 g/dL (ref 6.5–8.1)

## 2023-10-06 LAB — CBC WITH DIFFERENTIAL/PLATELET
Abs Immature Granulocytes: 0.07 10*3/uL (ref 0.00–0.07)
Basophils Absolute: 0 10*3/uL (ref 0.0–0.1)
Basophils Relative: 0 %
Eosinophils Absolute: 0 10*3/uL (ref 0.0–0.5)
Eosinophils Relative: 0 %
HCT: 25.8 % — ABNORMAL LOW (ref 39.0–52.0)
Hemoglobin: 8 g/dL — ABNORMAL LOW (ref 13.0–17.0)
Immature Granulocytes: 1 %
Lymphocytes Relative: 7 %
Lymphs Abs: 1 10*3/uL (ref 0.7–4.0)
MCH: 26.8 pg (ref 26.0–34.0)
MCHC: 31 g/dL (ref 30.0–36.0)
MCV: 86.3 fL (ref 80.0–100.0)
Monocytes Absolute: 0.6 10*3/uL (ref 0.1–1.0)
Monocytes Relative: 4 %
Neutro Abs: 12.7 10*3/uL — ABNORMAL HIGH (ref 1.7–7.7)
Neutrophils Relative %: 88 %
Platelets: 338 10*3/uL (ref 150–400)
RBC: 2.99 MIL/uL — ABNORMAL LOW (ref 4.22–5.81)
RDW: 17.2 % — ABNORMAL HIGH (ref 11.5–15.5)
WBC: 14.4 10*3/uL — ABNORMAL HIGH (ref 4.0–10.5)
nRBC: 0 % (ref 0.0–0.2)

## 2023-10-06 LAB — PHOSPHORUS: Phosphorus: 2.8 mg/dL (ref 2.5–4.6)

## 2023-10-06 LAB — URINALYSIS, W/ REFLEX TO CULTURE (INFECTION SUSPECTED)
Bilirubin Urine: NEGATIVE
Glucose, UA: NEGATIVE mg/dL
Hgb urine dipstick: NEGATIVE
Ketones, ur: NEGATIVE mg/dL
Leukocytes,Ua: NEGATIVE
Nitrite: NEGATIVE
Protein, ur: NEGATIVE mg/dL
Specific Gravity, Urine: 1.012 (ref 1.005–1.030)
pH: 5 (ref 5.0–8.0)

## 2023-10-06 LAB — MAGNESIUM: Magnesium: 1.9 mg/dL (ref 1.7–2.4)

## 2023-10-06 LAB — RESP PANEL BY RT-PCR (RSV, FLU A&B, COVID)  RVPGX2
Influenza A by PCR: NEGATIVE
Influenza B by PCR: NEGATIVE
Resp Syncytial Virus by PCR: NEGATIVE
SARS Coronavirus 2 by RT PCR: NEGATIVE

## 2023-10-06 LAB — GLUCOSE, CAPILLARY
Glucose-Capillary: 56 mg/dL — ABNORMAL LOW (ref 70–99)
Glucose-Capillary: 146 mg/dL — ABNORMAL HIGH (ref 70–99)

## 2023-10-06 LAB — PROTIME-INR
INR: 1.2 (ref 0.8–1.2)
Prothrombin Time: 15.8 s — ABNORMAL HIGH (ref 11.4–15.2)

## 2023-10-06 LAB — I-STAT CG4 LACTIC ACID, ED
Lactic Acid, Venous: 2 mmol/L (ref 0.5–1.9)
Lactic Acid, Venous: 0.8 mmol/L (ref 0.5–1.9)

## 2023-10-06 LAB — TSH: TSH: 54.339 u[IU]/mL — ABNORMAL HIGH (ref 0.350–4.500)

## 2023-10-06 MED ORDER — VANCOMYCIN HCL IN DEXTROSE 1-5 GM/200ML-% IV SOLN
1000.0000 mg | Freq: Once | INTRAVENOUS | Status: AC
  Administered 2023-10-06: 1000 mg via INTRAVENOUS
  Filled 2023-10-06: qty 200

## 2023-10-06 MED ORDER — ADULT MULTIVITAMIN W/MINERALS CH
1.0000 | ORAL_TABLET | Freq: Every day | ORAL | Status: DC
  Administered 2023-10-06 – 2023-10-09 (×4): 1
  Filled 2023-10-06 (×4): qty 1

## 2023-10-06 MED ORDER — LACTATED RINGERS IV BOLUS (SEPSIS)
1000.0000 mL | Freq: Once | INTRAVENOUS | Status: AC
  Administered 2023-10-06: 1000 mL via INTRAVENOUS

## 2023-10-06 MED ORDER — LACTATED RINGERS IV SOLN: INTRAVENOUS | Status: AC

## 2023-10-06 MED ORDER — FREE WATER
300.0000 mL | Freq: Four times a day (QID) | Status: DC
  Administered 2023-10-07 – 2023-10-09 (×10): 300 mL

## 2023-10-06 MED ORDER — JEVITY 1.5 CAL/FIBER PO LIQD
1440.0000 mL | ORAL | Status: DC
  Administered 2023-10-06: 1000 mL
  Filled 2023-10-06: qty 1659
  Filled 2023-10-06: qty 2000

## 2023-10-06 MED ORDER — IOHEXOL 350 MG/ML SOLN
75.0000 mL | Freq: Once | INTRAVENOUS | Status: AC | PRN
  Administered 2023-10-06: 75 mL via INTRAVENOUS

## 2023-10-06 MED ORDER — LEVOTHYROXINE SODIUM 75 MCG PO TABS
75.0000 ug | ORAL_TABLET | Freq: Every day | ORAL | Status: DC
  Administered 2023-10-07: 75 ug
  Filled 2023-10-06: qty 1

## 2023-10-06 MED ORDER — FERROUS SULFATE 300 (60 FE) MG/5ML PO SOLN
300.0000 mg | ORAL | Status: DC
  Administered 2023-10-07 – 2023-10-09 (×2): 300 mg
  Filled 2023-10-06 (×2): qty 5

## 2023-10-06 MED ORDER — METRONIDAZOLE 500 MG/100ML IV SOLN
500.0000 mg | Freq: Once | INTRAVENOUS | Status: AC
  Administered 2023-10-06: 500 mg via INTRAVENOUS
  Filled 2023-10-06: qty 100

## 2023-10-06 MED ORDER — LACTATED RINGERS IV BOLUS (SEPSIS)
250.0000 mL | Freq: Once | INTRAVENOUS | Status: AC
  Administered 2023-10-06: 250 mL via INTRAVENOUS

## 2023-10-06 MED ORDER — FONDAPARINUX SODIUM 2.5 MG/0.5ML ~~LOC~~ SOLN
2.5000 mg | SUBCUTANEOUS | Status: DC
  Administered 2023-10-06 – 2023-10-08 (×3): 2.5 mg via SUBCUTANEOUS
  Filled 2023-10-06 (×5): qty 0.5

## 2023-10-06 MED ORDER — VITAMIN B-12 1000 MCG PO TABS
1000.0000 ug | ORAL_TABLET | Freq: Every day | ORAL | Status: DC
  Administered 2023-10-06 – 2023-10-09 (×4): 1000 ug
  Filled 2023-10-06 (×4): qty 1

## 2023-10-06 MED ORDER — LACTATED RINGERS IV BOLUS (SEPSIS)
500.0000 mL | Freq: Once | INTRAVENOUS | Status: AC
  Administered 2023-10-06: 500 mL via INTRAVENOUS

## 2023-10-06 MED ORDER — VANCOMYCIN HCL 750 MG/150ML IV SOLN
750.0000 mg | INTRAVENOUS | Status: DC
Start: 2023-10-07 — End: 2023-10-09
  Administered 2023-10-07 – 2023-10-09 (×3): 750 mg via INTRAVENOUS
  Filled 2023-10-06 (×3): qty 150

## 2023-10-06 MED ORDER — SODIUM CHLORIDE 0.9 % IV SOLN
2.0000 g | Freq: Two times a day (BID) | INTRAVENOUS | Status: DC
  Administered 2023-10-06 – 2023-10-09 (×6): 2 g via INTRAVENOUS
  Filled 2023-10-06 (×6): qty 12.5

## 2023-10-06 MED ORDER — DEXTROSE 50 % IV SOLN
12.5000 g | INTRAVENOUS | Status: AC
  Administered 2023-10-06: 12.5 g via INTRAVENOUS
  Filled 2023-10-06: qty 50

## 2023-10-06 MED ORDER — SODIUM CHLORIDE 0.9 % IV SOLN
2.0000 g | Freq: Once | INTRAVENOUS | Status: AC
  Administered 2023-10-06: 2 g via INTRAVENOUS
  Filled 2023-10-06: qty 12.5

## 2023-10-06 NOTE — ED Provider Notes (Signed)
 [place] EMERGENCY DEPARTMENT AT [facility] Provider Note   CSN: [id number] Arrival date & time: 10/06/23  0859     History  Chief Complaint  Patient presents with   Weakness    [name] [name] is a 89 y.o. male.  The history is provided by medical records, the nursing home and the EMS personnel. No language interpreter was used.  Weakness Severity:  Unable to specify Onset quality:  Gradual Duration:  3 days Timing:  Constant Progression:  Worsening Relieved by:  Nothing Worsened by:  Nothing Ineffective treatments:  None tried Associated symptoms: abdominal pain, cough, nausea, shortness of breath and vomiting   Associated symptoms: no chest pain, no diarrhea, no dysuria, no fever and no headaches        Home Medications Prior to Admission medications   Medication Sig Start Date End Date Taking? Authorizing Provider  amLODipine (NORVASC) 5 MG tablet Crush 1 tablet (5 mg total) and dissolve in a small amount of water to give by feeding tube daily. 09/30/23   Danford, Earl Lites, MD  cyanocobalamin 1000 MCG tablet Crush 1 tablet (1,000 mcg total) and dissolve in a small amount of water to give by feeding tube daily. 09/30/23   Danford, Earl Lites, MD  Iron, Ferrous Sulfate, 325 (65 Fe) MG TABS Take 1 tablet (325 mg) by mouth every other day. Please make an appointment with Dr. Laural Benes for more refills. Patient taking differently: Place 325 mg into feeding tube every other day. 04/10/23   Marcine Matar, MD  levothyroxine (SYNTHROID) 75 MCG tablet Take 1 tablet (75 mcg total) by mouth daily before breakfast. 04/12/23   Marcine Matar, MD  Multiple Vitamin (MULTIVITAMIN WITH MINERALS) TABS tablet Take 1 tablet by mouth daily. 09/30/23   Danford, Earl Lites, MD  Nutritional Supplements (FEEDING SUPPLEMENT, JEVITY 1.5 CAL/FIBER,) LIQD Place 1,440 mLs into feeding tube continuous. 09/30/23   Danford, Earl Lites, MD  Water For Irrigation, Sterile (FREE WATER)  SOLN Place 300 mLs into feeding tube every 6 (six) hours. 09/30/23   Danford, Earl Lites, MD      Allergies    Pork-derived products    Review of Systems   Review of Systems  Constitutional:  Positive for chills and fatigue. Negative for fever.  Respiratory:  Positive for cough and shortness of breath. Negative for chest tightness and wheezing.   Cardiovascular:  Negative for chest pain, palpitations and leg swelling.  Gastrointestinal:  Positive for abdominal pain, nausea and vomiting. Negative for constipation and diarrhea.  Genitourinary:  Negative for dysuria.  Musculoskeletal:  Negative for back pain.  Skin:  Negative for rash.  Neurological:  Positive for weakness. Negative for headaches.  Psychiatric/Behavioral:  Negative for agitation.   All other systems reviewed and are negative.   Physical Exam Updated Vital Signs BP (!) 128/106   Pulse (!) 102   Temp (!) 95.2 F (35.1 C) (Rectal)   Resp 18   SpO2 99%  Physical Exam Vitals and nursing note reviewed.  Constitutional:      General: He is not in acute distress.    Appearance: He is well-developed. He is ill-appearing.  HENT:     Head: Normocephalic and atraumatic.     Nose: No congestion or rhinorrhea.     Mouth/Throat:     Mouth: Mucous membranes are dry.  Eyes:     Extraocular Movements: Extraocular movements intact.     Conjunctiva/sclera: Conjunctivae normal.     Pupils: Pupils are equal,  round, and reactive to light.  Cardiovascular:     Rate and Rhythm: Regular rhythm. Tachycardia present.     Heart sounds: No murmur heard. Pulmonary:     Breath sounds: Rhonchi present. No wheezing or rales.  Chest:     Chest wall: No tenderness.  Abdominal:     Palpations: Abdomen is soft.     Tenderness: There is abdominal tenderness.  Musculoskeletal:        General: No swelling or tenderness.     Cervical back: Neck supple. No tenderness.  Skin:    General: Skin is warm and dry.     Capillary Refill:  Capillary refill takes less than 2 seconds.     Findings: No erythema.  Neurological:     Mental Status: He is alert.  Psychiatric:        Mood and Affect: Mood normal.     ED Results / Procedures / Treatments   Labs (all labs ordered are listed, but only abnormal results are displayed) Labs Reviewed  COMPREHENSIVE METABOLIC PANEL WITH GFR - Abnormal; Notable for the following components:      Result Value   Sodium 134 (*)    CO2 20 (*)    BUN 37 (*)    Albumin 2.2 (*)    GFR, Estimated 59 (*)    All other components within normal limits  CBC WITH DIFFERENTIAL/PLATELET - Abnormal; Notable for the following components:   WBC 14.4 (*)    RBC 2.99 (*)    Hemoglobin 8.0 (*)    HCT 25.8 (*)    RDW 17.2 (*)    Neutro Abs 12.7 (*)    All other components within normal limits  PROTIME-INR - Abnormal; Notable for the following components:   Prothrombin Time 15.8 (*)    All other components within normal limits  URINALYSIS, W/ REFLEX TO CULTURE (INFECTION SUSPECTED) - Abnormal; Notable for the following components:   APPearance HAZY (*)    Bacteria, UA RARE (*)    All other components within normal limits  TSH - Abnormal; Notable for the following components:   TSH 54.339 (*)    All other components within normal limits  I-STAT CG4 LACTIC ACID, ED - Abnormal; Notable for the following components:   Lactic Acid, Venous 2.0 (*)    All other components within normal limits  RESP PANEL BY RT-PCR (RSV, FLU A&B, COVID)  RVPGX2  CULTURE, BLOOD (ROUTINE X 2)  CULTURE, BLOOD (ROUTINE X 2)  I-STAT CG4 LACTIC ACID, ED    EKG EKG Interpretation Date/Time:  Friday October 06 2023 10:28:40 EDT Ventricular Rate:  85 PR Interval:    QRS Duration:  208 QT Interval:  401 QTC Calculation: 477 R Axis:   85  Text Interpretation: Accelerated junctional rhythm Nonspecific intraventricular conduction delay Anteroseptal infarct, age indeterminate Artifact in lead(s) I II aVR aVL aVF V1 V2 V4  when compared to prior, more artifact no sTEMI Confirmed by Theda Belfast ([id number]) on 10/06/2023 10:46:48 AM  Radiology CT CHEST ABDOMEN PELVIS W CONTRAST Result Date: 10/06/2023 CLINICAL DATA:  Sepsis with abdominal tenderness and very bad sounding lungs. Also possible concern that every time the G-tube is used it comes out of his mouth and aspirates. EXAM: CT CHEST, ABDOMEN, AND PELVIS WITH CONTRAST TECHNIQUE: Multidetector CT imaging of the chest, abdomen and pelvis was performed following the standard protocol during bolus administration of intravenous contrast. RADIATION DOSE REDUCTION: This exam was performed according to the departmental dose-optimization program which includes  automated exposure control, adjustment of the mA and/or kV according to patient size and/or use of iterative reconstruction technique. CONTRAST:  75mL OMNIPAQUE IOHEXOL 350 MG/ML SOLN COMPARISON:  CT scan chest, abdomen and pelvis from 09/20/2023.  Of FINDINGS: CT CHEST FINDINGS Cardiovascular: Normal cardiac size. No pericardial effusion. No aortic aneurysm. There are coronary artery calcifications, in keeping with coronary artery disease. There are also mild peripheral atherosclerotic vascular calcifications of thoracic aorta and its major branches. There is dilation of the main pulmonary trunk measuring up to 3.5 cm, which is nonspecific but can be seen with pulmonary artery hypertension. Mediastinum/Nodes: Visualized thyroid gland appears grossly unremarkable. No solid / cystic mediastinal masses. The esophagus is nondistended precluding optimal assessment. No axillary, mediastinal or hilar lymphadenopathy by size criteria. Lungs/Pleura: The central tracheo-bronchial tree is patent. There are heterogeneous opacities throughout the left lung lower lobe as well as apicoposterior segment of left upper lobe, without volume loss, compatible with multilobar pneumonia. There are also patchy opacities in the right lung lower lobe, which  may represent additional focus of consolidation. There is a small grouping of tree-in-bud configuration nodules in the posterior segment of right upper lobe. There is small left pleural effusion. No right pleural effusion. No pneumothorax. No suspicious lung nodule. Musculoskeletal: The visualized soft tissues of the chest wall are grossly unremarkable. No suspicious osseous lesions. There are moderate to severe multilevel degenerative changes in the visualized spine. CT ABDOMEN PELVIS FINDINGS Hepatobiliary: The liver is normal in size. Non-cirrhotic configuration. No suspicious mass. No intrahepatic or extrahepatic bile duct dilation. No calcified gallstones. Normal gallbladder wall thickness. No pericholecystic inflammatory changes. Pancreas: Unremarkable. No pancreatic ductal dilatation or surrounding inflammatory changes. Spleen: Within normal limits. No focal lesion. Adrenals/Urinary Tract: Adrenal glands are unremarkable. Mild diffuse atrophy of bilateral kidneys. No suspicious renal mass. No hydronephrosis. No renal or ureteric calculi. Unremarkable urinary bladder. Stomach/Bowel: Gastrostomy tube noted in the left paramedian epigastric region with its balloon inflated in the distal body of the stomach. No disproportionate dilation of the small or large bowel loops. No evidence of abnormal bowel wall thickening or inflammatory changes. The appendix was not visualized; however there is no acute inflammatory process in the right lower quadrant. Vascular/Lymphatic: No ascites or pneumoperitoneum. No abdominal or pelvic lymphadenopathy, by size criteria. No aneurysmal dilation of the major abdominal arteries. There are moderate peripheral atherosclerotic vascular calcifications of the aorta and its major branches. Reproductive: Enlarged prostate. Symmetric seminal vesicles. Other: There is probable retractile left testicle which is in the superficial portion of left inguinal canal. The visualized soft tissues  and abdominal wall are otherwise unremarkable. Musculoskeletal: No suspicious osseous lesions. There are moderate - marked multilevel degenerative changes in the visualized spine. IMPRESSION: 1. There are heterogeneous opacities throughout the left lung lower lobe as well as apicoposterior segment of left upper lobe, without volume loss, compatible with multilobar pneumonia. There are also patchy opacities in the right lung lower lobe, which may represent additional focus of consolidation. There is a small grouping of tree-in-bud configuration nodules in the posterior segment of right upper lobe, favoring infectious/inflammatory bronchiolitis. 2. No acute inflammatory process identified within the abdomen or pelvis. 3. Multiple other nonacute observations, as described above. Aortic Atherosclerosis (ICD10-I70.0). Electronically Signed   By: Jules Schick M.D.   On: 10/06/2023 12:51   DG Chest Port 1 View Result Date: 10/06/2023 CLINICAL DATA:  Sepsis. EXAM: PORTABLE CHEST 1 VIEW COMPARISON:  September 20, 2023. FINDINGS: Stable cardiomediastinal silhouette. Old proximal left humeral  fracture is noted. Minimal right basilar subsegmental atelectasis or scarring is noted. Left perihilar and basilar opacities are noted concerning for possible atelectasis or infiltrate with small left pleural effusion. IMPRESSION: Left lung opacities are noted concerning for pneumonia or atelectasis with small left pleural effusion. Electronically Signed   By: Lupita Raider M.D.   On: 10/06/2023 09:52    Procedures Procedures    Medications Ordered in ED Medications  lactated ringers infusion (has no administration in time range)  lactated ringers bolus 1,000 mL (has no administration in time range)    And  lactated ringers bolus 500 mL (has no administration in time range)    And  lactated ringers bolus 250 mL (has no administration in time range)  ceFEPIme (MAXIPIME) 2 g in sodium chloride 0.9 % 100 mL IVPB (has no  administration in time range)  metroNIDAZOLE (FLAGYL) IVPB 500 mg (has no administration in time range)  vancomycin (VANCOCIN) IVPB 1000 mg/200 mL premix (has no administration in time range)    ED Course/ Medical Decision Making/ A&P                                 Medical Decision Making Amount and/or Complexity of Data Reviewed Labs: ordered. Radiology: ordered.  Risk Prescription drug management. Decision regarding hospitalization.    [name] [name] is a 89 y.o. male with a past medical history significant for hypothyroidism, hypertension, BPH, previous aspiration pneumonias, PEG tube dependence, dementia, gout, eczema, and anemia who presents with worsening fatigue, weakness, coarse breathing, and vomiting.  According to EMS report from family, he has G-tube replaced recently and has been having the feeds come out his mouth every time it is used.  He has been more tired and fatigued and was warm to the touch.  Oxygen saturations were about 90% on room air per EMS and he does not have oxygen at home.  Patient was coughing and having some shortness of breath.  Patient has not had any reported traumas.  On arrival, patient is tachycardic, tachypneic, and rectal temperature revealed a cool temperature 95.2.   Patient was answering questions yes and no without interpreter.  On exam, patient is extremely coarse breath sounds.  Oxygen saturation is in the mid 90s now he is on some oxygen.  Abdomen was quite tender and he was saying yes to abdominal pain.  He denied chest pain but had a shortness of breath.  He has bandages on his ankles.  Back was nontender.  Dry mucous membranes.  Patient is ill-appearing.  Due to patient's vital signs and concern for infection, will activate a code sepsis.  I am concerned about pneumonia and aspiration pneumonia given his history.  With this verbal report from EMS that every time they use the feeding tube it leaks out of his mouth I am concerned that there may  be malpositioning of the feeding tube.  Will get a chest x-ray initially to look for pneumothorax although his breath sounds were coarse but symmetric.  Will get CT of the chest/abdomen/pelvis not only to look for multifocal pneumonia or aspiration pneumonia, and also look for intra-abdominal pathology and look for problem with the feeding tube.  Due to his sepsis we will give the antibiotics, fluids, and will give a Bair hugger for his cold temperature.  Anticipate admission after workup is completed.  12:11 PM Workup continues to return.  Lactic acid elevated at 2.0.  He does have a leukocytosis of 14.4.  TSH is elevated but is improved from prior.  Suspect some degree of hypothyroidism however his x-ray does show dense of pneumonia.  Still waiting on results of CT scan but he will need admission for what I suspect is aspiration pneumonia in the setting of all of this vomiting/feeds coming up and down his mouth.  He was negative for COVID/flu/RSV and his urinalysis did not show evidence of acute urinary tract infection.  Will admit after CT is read.  1:09 PM CT returned showing pneumonia but no other surgical problem seen initially.  Will call for sepsis pneumonia admission.   4:30 PM Patient was transported upstairs as he initially was going to be admitted by teaching service.  After the bed was ordered by the teaching service, they realized he actually should be a bounce back to a [facility] hospital[facility] service so they canceled the admission order.  Patient was transported up to his bed and he is now up in 2C16.  I was able to speak with bed placement who will page hospitalist service to admit the patient who is already upstairs.        Final Clinical Impression(s) / ED Diagnoses Final diagnoses:  Sepsis due to pneumonia [facility])     Clinical Impression: 1. Sepsis due to pneumonia [facility])     Disposition: Admit  This note was prepared with assistance of Dragon voice recognition  software. Occasional wrong-word or sound-a-like substitutions may have occurred due to the inherent limitations of voice recognition software.      Tena Linebaugh, Canary Brim, MD 10/06/23 [phone]

## 2023-10-06 NOTE — Plan of Care (Addendum)
 Initially was consulted for admission of this patient to the family medicine teaching service as an unassigned patient.  However, on chart review, it appears patient was admitted to [facility] less than a week ago.  Based on my understanding of admission policy, because this patient was discharged less than a week ago on a different hospital service, the discharging service should admit the patient for further evaluation and workup.  I called the ED secretary at that time to inform her of this plan.  I canceled admission orders at that time.  Received a page around 4:15 PM from RN on 2C stating she was informed that [facility] was attending team for this patient.  I informed her of the above.  I again called ED secretary to also let them know again of this policy.  I gave ED secretary patient's information to reach out to [facility] so this patient could be assigned to this team.  They stated they would help to resolve the issue.  Addendum: Talked with Dr. Rush Landmark concerning the matter.  He was unaware that [facility] had called the ED secretary for switch of admitting team.  He states he will look into getting the patient connected with [facility].

## 2023-10-06 NOTE — ED Triage Notes (Signed)
 Pt here from home with c/o weakness and g tube problems , family states that when they feed him through his g tube it comes out of his mouth

## 2023-10-06 NOTE — Plan of Care (Signed)

## 2023-10-06 NOTE — Care Management (Signed)
 Transition of Care [name]) - Inpatient Brief Assessment   Patient Details  Name: [name] [name] MRN: [id number] Date of Birth: 1933-07-14  Transition of Care [facility]) CM/SW Contact:    Lockie Pares, RN Phone Number: 10/06/2023, 4:17 PM   Clinical Narrative: 89 year old paitent just discharged after a lengthy stay at [name] 3/26 to 4/7at that time he applied for medicaid, received a hospital bed from the hospital and had one year of tube feedings from [facility]. He presents here today with weakness and g tube problems.  Admitted [facility] , as they saw him last week As he does not have insurance, he is followed by [employer] and wellness and case management there.   TOC will follow for any further needs, recommendations and transitions of care.  Transition of Care Asessment: Insurance and Status: Insurance coverage has been reviewed Patient has primary care physician: Yes Home environment has been reviewed: Lives with son has  hospital bed and tube feedings from [facility] for a year Prior level of function:: Bed bound Prior/Current Home Services: No current home services Social Drivers of Health Review: SDOH reviewed no interventions necessary Readmission risk has been reviewed: Yes Transition of care needs: transition of care needs identified, TOC will continue to follow

## 2023-10-06 NOTE — Sepsis Progress Note (Signed)
 Sepsis protocol monitored by [employer] ?

## 2023-10-06 NOTE — H&P (Signed)
 History and Physical  [name] [name] JWJ:[id number] DOB: Oct 10, 1933 DOA: 10/06/2023  Referring physician: Lora Paula, MD PCP: Marcine Matar, MD  Outpatient Specialists:  Patient coming from: Skilled nursing facility  Chief Complaint: Shortness of breath and fever  HPI:  Patient is an 89 year old male, skilled nursing facility resident, past medical history significant for PEG tube placement, hypertension, hypothyroidism, gout and BPH.  Patient is not able to give any history, despite involvement of an Arabic interpreter.  Patient has PEG tube that has been noted to be malfunctioning, with a lot of the tube feed noted to be in patient's mouth.  Patient was transferred to the emergency room with shortness of breath, fever, tachycardia, leukocytosis with left shift and hypotension (blood pressure of 95/48 mmHg).  Lactic acid was initially 2, but improved to 0.8.  Chest x-ray revealed left lung opacity, concerning for pneumonia, with small left pleural effusion.  CT scan of the chest/abdomen/pelvis revealed multilobar pneumonia.  Since presentation to the hospital, temperature has ranged from 95.2 to 100.6 F, heart rate of 80 to 102 bpm, respiratory rate of 18 to 29/min and O2 sat of 93 to 99%.  BMP revealed sodium of 134, potassium of 4.8, chloride 101, CO2 20, BUN of 37, creatinine of 1.18 with blood sugar of 91 and EGFR of 59 mL/min per 1.73 m.  Albumin was 2.2.  CBC revealed WBC of 14.4, hemoglobin of 8, hematocrit of 25.8, platelet count of 338 and MCV of 86.3.  Blood cultures are pending.  TSH of 54.33.  Influenza A, B and COVID came back negative.  UA revealed specific gravity of 1.012.  Patient received IV vancomycin, cefepime and Flagyl in the emergency room for likely sepsis/aspiration pneumonia.  Hospitalist team has been asked to admit patient.    ED Course: See above documentation. Pertinent labs: See above documentation. EKG: Independently reviewed.  Imaging: independently  reviewed.   Review of Systems:  Unobtainable.  Past Medical History:  Diagnosis Date   Back pain 10/2008   per MRI 11/06/2008 - severe spinal stenosis worse at L4-5, L3-4, with neural compression at those levels   BPH (benign prostatic hyperplasia)    Eczema    Gout    Hypertension    Hypothyroidism     Past Surgical History:  Procedure Laterality Date   ESOPHAGOGASTRODUODENOSCOPY N/A 04/03/2022   Procedure: ESOPHAGOGASTRODUODENOSCOPY (EGD);  Surgeon: Jeani Hawking, MD;  Location: [facility] ENDOSCOPY;  Service: Gastroenterology;  Laterality: N/A;   IR REPLC GASTRO/COLONIC TUBE PERCUT W/FLUORO  09/29/2023   LAMINECTOMY  12/11/2008    Bilateral L2, L3, and L4 laminotomies, done by Dr. Lovell Sheehan   LAPAROSCOPY N/A 04/06/2022   Procedure: LAPAROSCOPIC ASSISTED PERCUTANEOUS ENDOSCOPIC GASTROSTOMY PLACEMENT;  Surgeon: Manus Rudd, MD;  Location: [facility] OR;  Service: General;  Laterality: N/A;     reports that he has never smoked. He has never used smokeless tobacco. He reports that he does not drink alcohol and does not use drugs.  Allergies  Allergen Reactions   Pork-Derived Products Other (See Comments)    Religious Reasons    Family History  Problem Relation Age of Onset   Stroke Neg Hx    Cancer Neg Hx      Prior to Admission medications   Medication Sig Start Date End Date Taking? Authorizing Provider  amLODipine (NORVASC) 5 MG tablet Crush 1 tablet (5 mg total) and dissolve in a small amount of water to give by feeding tube daily. 09/30/23  Yes Danford, Earl Lites, MD  cyanocobalamin 1000 MCG tablet Crush 1 tablet (1,000 mcg total) and dissolve in a small amount of water to give by feeding tube daily. 09/30/23  Yes Danford, Earl Lites, MD  Iron, Ferrous Sulfate, 325 (65 Fe) MG TABS Take 1 tablet (325 mg) by mouth every other day. Please make an appointment with Dr. Laural Benes for more refills. Patient taking differently: Place 325 mg into feeding tube every other day. 04/10/23  Yes  Marcine Matar, MD  levothyroxine (SYNTHROID) 75 MCG tablet Take 1 tablet (75 mcg total) by mouth daily before breakfast. 04/12/23  Yes Marcine Matar, MD  Multiple Vitamin (MULTIVITAMIN WITH MINERALS) TABS tablet Take 1 tablet by mouth daily. 09/30/23  Yes Danford, Earl Lites, MD  Nutritional Supplements (FEEDING SUPPLEMENT, JEVITY 1.5 CAL/FIBER,) LIQD Place 1,440 mLs into feeding tube continuous. 09/30/23  Yes Danford, Earl Lites, MD  Water For Irrigation, Sterile (FREE WATER) SOLN Place 300 mLs into feeding tube every 6 (six) hours. 09/30/23   Alberteen Sam, MD    Physical Exam: Vitals:   10/06/23 1645 10/06/23 1700 10/06/23 1730 10/06/23 1800  BP:  126/86 (!) 119/49 (!) 119/58  Pulse: 93 87 98 90  Resp: 20 20 (!) 25 (!) 27  Temp:      TempSrc:      SpO2: 96% 94% 96% 95%  Weight:      Height:         Constitutional:  Appears acutely and chronically ill looking.  Patient is cachectic.  Patient is mumbling.   Eyes:  Patient is pale.  No jaundice.    ENMT:  external ears, nose appear normal Neck:  Neck is supple. No JVD Respiratory:  Decreased air entry.   Cardiovascular:  S1S2 Mild bilateral ankle edema.    Abdomen:  Abdomen is soft and non tender. Organs are difficult to assess. Neurologic:  Awake and alert. Moves all limbs.  Wt Readings from Last 3 Encounters:  10/06/23 59.5 kg  10/01/23 51.1 kg  09/22/23 51.1 kg    I have personally reviewed following labs and imaging studies  Labs on Admission:  CBC: Recent Labs  Lab 10/06/23 0918  WBC 14.4*  NEUTROABS 12.7*  HGB 8.0*  HCT 25.8*  MCV 86.3  PLT 338   Basic Metabolic Panel: Recent Labs  Lab 10/06/23 0918  NA 134*  K 4.8  CL 101  CO2 20*  GLUCOSE 91  BUN 37*  CREATININE 1.18  CALCIUM 9.2   Liver Function Tests: Recent Labs  Lab 10/06/23 0918  AST 32  ALT 26  ALKPHOS 68  BILITOT 0.4  PROT 7.3  ALBUMIN 2.2*   No results for input(s): "LIPASE", "AMYLASE" in the last  168 hours. No results for input(s): "AMMONIA" in the last 168 hours. Coagulation Profile: Recent Labs  Lab 10/06/23 0918  INR 1.2   Cardiac Enzymes: No results for input(s): "CKTOTAL", "CKMB", "CKMBINDEX", "TROPONINI" in the last 168 hours. BNP (last 3 results) No results for input(s): "PROBNP" in the last 8760 hours. HbA1C: No results for input(s): "HGBA1C" in the last 72 hours. CBG: No results for input(s): "GLUCAP" in the last 168 hours. Lipid Profile: No results for input(s): "CHOL", "HDL", "LDLCALC", "TRIG", "CHOLHDL", "LDLDIRECT" in the last 72 hours. Thyroid Function Tests: Recent Labs    10/06/23 0918  TSH 54.339*   Anemia Panel: No results for input(s): "VITAMINB12", "FOLATE", "FERRITIN", "TIBC", "IRON", "RETICCTPCT" in the last 72 hours. Urine analysis:    Component Value Date/Time   COLORURINE YELLOW  10/06/2023 0947   APPEARANCEUR HAZY (A) 10/06/2023 0947   LABSPEC 1.012 10/06/2023 0947   PHURINE 5.0 10/06/2023 0947   GLUCOSEU NEGATIVE 10/06/2023 0947   HGBUR NEGATIVE 10/06/2023 0947   BILIRUBINUR NEGATIVE 10/06/2023 0947   KETONESUR NEGATIVE 10/06/2023 0947   PROTEINUR NEGATIVE 10/06/2023 0947   UROBILINOGEN 0.2 12/03/2021 1921   NITRITE NEGATIVE 10/06/2023 0947   LEUKOCYTESUR NEGATIVE 10/06/2023 0947   Sepsis Labs: @LABRCNTIP (procalcitonin:4,lacticidven:4) ) Recent Results (from the past 240 hours)  Resp panel by RT-PCR (RSV, Flu A&B, Covid) Anterior Nasal Swab     Status: None   Collection Time: 10/06/23  9:09 AM   Specimen: Anterior Nasal Swab  Result Value Ref Range Status   SARS Coronavirus 2 by RT PCR NEGATIVE NEGATIVE Final   Influenza A by PCR NEGATIVE NEGATIVE Final   Influenza B by PCR NEGATIVE NEGATIVE Final    Comment: (NOTE) The Xpert Xpress SARS-CoV-2/FLU/RSV plus assay is intended as an aid in the diagnosis of influenza from Nasopharyngeal swab specimens and should not be used as a sole basis for treatment. Nasal washings  and aspirates are unacceptable for Xpert Xpress SARS-CoV-2/FLU/RSV testing.  Fact Sheet for Patients: BloggerCourse.com  Fact Sheet for Healthcare Providers: SeriousBroker.it  This test is not yet approved or cleared by the [place] FDA and has been authorized for detection and/or diagnosis of SARS-CoV-2 by FDA under an Emergency Use Authorization (EUA). This EUA will remain in effect (meaning this test can be used) for the duration of the COVID-19 declaration under Section 564(b)(1) of the Act, 21 U.S.C. section 360bbb-3(b)(1), unless the authorization is terminated or revoked.     Resp Syncytial Virus by PCR NEGATIVE NEGATIVE Final    Comment: (NOTE) Fact Sheet for Patients: BloggerCourse.com  Fact Sheet for Healthcare Providers: SeriousBroker.it  This test is not yet approved or cleared by the [place] FDA and has been authorized for detection and/or diagnosis of SARS-CoV-2 by FDA under an Emergency Use Authorization (EUA). This EUA will remain in effect (meaning this test can be used) for the duration of the COVID-19 declaration under Section 564(b)(1) of the Act, 21 U.S.C. section 360bbb-3(b)(1), unless the authorization is terminated or revoked.  Performed at [name], [address], [place], [place] [address]       Radiological Exams on Admission: CT CHEST ABDOMEN PELVIS W CONTRAST Result Date: 10/06/2023 CLINICAL DATA:  Sepsis with abdominal tenderness and very bad sounding lungs. Also possible concern that every time the G-tube is used it comes out of his mouth and aspirates. EXAM: CT CHEST, ABDOMEN, AND PELVIS WITH CONTRAST TECHNIQUE: Multidetector CT imaging of the chest, abdomen and pelvis was performed following the standard protocol during bolus administration of intravenous contrast. RADIATION DOSE REDUCTION: This exam was performed according  to the departmental dose-optimization program which includes automated exposure control, adjustment of the mA and/or kV according to patient size and/or use of iterative reconstruction technique. CONTRAST:  75mL OMNIPAQUE IOHEXOL 350 MG/ML SOLN COMPARISON:  CT scan chest, abdomen and pelvis from 09/20/2023.  Of FINDINGS: CT CHEST FINDINGS Cardiovascular: Normal cardiac size. No pericardial effusion. No aortic aneurysm. There are coronary artery calcifications, in keeping with coronary artery disease. There are also mild peripheral atherosclerotic vascular calcifications of thoracic aorta and its major branches. There is dilation of the main pulmonary trunk measuring up to 3.5 cm, which is nonspecific but can be seen with pulmonary artery hypertension. Mediastinum/Nodes: Visualized thyroid gland appears grossly unremarkable. No solid / cystic mediastinal masses.  The esophagus is nondistended precluding optimal assessment. No axillary, mediastinal or hilar lymphadenopathy by size criteria. Lungs/Pleura: The central tracheo-bronchial tree is patent. There are heterogeneous opacities throughout the left lung lower lobe as well as apicoposterior segment of left upper lobe, without volume loss, compatible with multilobar pneumonia. There are also patchy opacities in the right lung lower lobe, which may represent additional focus of consolidation. There is a small grouping of tree-in-bud configuration nodules in the posterior segment of right upper lobe. There is small left pleural effusion. No right pleural effusion. No pneumothorax. No suspicious lung nodule. Musculoskeletal: The visualized soft tissues of the chest wall are grossly unremarkable. No suspicious osseous lesions. There are moderate to severe multilevel degenerative changes in the visualized spine. CT ABDOMEN PELVIS FINDINGS Hepatobiliary: The liver is normal in size. Non-cirrhotic configuration. No suspicious mass. No intrahepatic or extrahepatic bile duct  dilation. No calcified gallstones. Normal gallbladder wall thickness. No pericholecystic inflammatory changes. Pancreas: Unremarkable. No pancreatic ductal dilatation or surrounding inflammatory changes. Spleen: Within normal limits. No focal lesion. Adrenals/Urinary Tract: Adrenal glands are unremarkable. Mild diffuse atrophy of bilateral kidneys. No suspicious renal mass. No hydronephrosis. No renal or ureteric calculi. Unremarkable urinary bladder. Stomach/Bowel: Gastrostomy tube noted in the left paramedian epigastric region with its balloon inflated in the distal body of the stomach. No disproportionate dilation of the small or large bowel loops. No evidence of abnormal bowel wall thickening or inflammatory changes. The appendix was not visualized; however there is no acute inflammatory process in the right lower quadrant. Vascular/Lymphatic: No ascites or pneumoperitoneum. No abdominal or pelvic lymphadenopathy, by size criteria. No aneurysmal dilation of the major abdominal arteries. There are moderate peripheral atherosclerotic vascular calcifications of the aorta and its major branches. Reproductive: Enlarged prostate. Symmetric seminal vesicles. Other: There is probable retractile left testicle which is in the superficial portion of left inguinal canal. The visualized soft tissues and abdominal wall are otherwise unremarkable. Musculoskeletal: No suspicious osseous lesions. There are moderate - marked multilevel degenerative changes in the visualized spine. IMPRESSION: 1. There are heterogeneous opacities throughout the left lung lower lobe as well as apicoposterior segment of left upper lobe, without volume loss, compatible with multilobar pneumonia. There are also patchy opacities in the right lung lower lobe, which may represent additional focus of consolidation. There is a small grouping of tree-in-bud configuration nodules in the posterior segment of right upper lobe, favoring infectious/inflammatory  bronchiolitis. 2. No acute inflammatory process identified within the abdomen or pelvis. 3. Multiple other nonacute observations, as described above. Aortic Atherosclerosis (ICD10-I70.0). Electronically Signed   By: Jules Schick M.D.   On: 10/06/2023 12:51   DG Chest Port 1 View Result Date: 10/06/2023 CLINICAL DATA:  Sepsis. EXAM: PORTABLE CHEST 1 VIEW COMPARISON:  September 20, 2023. FINDINGS: Stable cardiomediastinal silhouette. Old proximal left humeral fracture is noted. Minimal right basilar subsegmental atelectasis or scarring is noted. Left perihilar and basilar opacities are noted concerning for possible atelectasis or infiltrate with small left pleural effusion. IMPRESSION: Left lung opacities are noted concerning for pneumonia or atelectasis with small left pleural effusion. Electronically Signed   By: Lupita Raider M.D.   On: 10/06/2023 09:52    EKG: Independently reviewed.   Principal Problem:   Aspiration pneumonia (HCC) Active Problems:   Sepsis (HCC)   Assessment/Plan Sepsis/aspiration pneumonia: -Patient has PEG tube in place. -Tube feeds have been noted to be in patient's oral cavity. -Patient presents with fever, chills, tachycardia, leukocytosis, initial lactic acid of 2  and chest x-ray all suggestive of left lung pneumonia, and CT chest suggestive of multifocal pneumonia. -Aspiration precautions. -Hold tube feeds for now. -Panculture patient. -Continue IV Vanco and cefepime. -Monitor renal function, electrolytes and CBC. -Dietary consult. -Speech therapy consult. -Further management will depend on above.  Cachexia/malnutrition (moderate to severe): -Status post PEG tube placement. -Tube feeds on hold for now. -Consult IR to assess functionality of PEG tube. -Consult dietary team. -Albumin of 2.2 -BMI of 17.79.  Anemia: -Chronic. -Likely multifactorial. -Low threshold to check iron panel, B12 and folate.  Hyponatremia: -Sodium of 134. -UA with specific  gravity of 1.012.   -Suspect prerenal. -Repeat renal panel in the morning.  DVT prophylaxis: Subcutaneous Arixtra 2.5 Mg daily. Code Status: Full code. Family Communication:  Disposition Plan: Patient remains inpatient for now.  Will admit patient to progressive unit.  Patient is very ill looking. Consults called: None. Admission status: Inpatient  Time spent: 85 minutes.   Berton Mount, MD  [facility] Pager #: [phone] 7PM-7AM contact night coverage as above  10/06/2023, 6:23 PM

## 2023-10-06 NOTE — ED Notes (Signed)
 Patient transported to CT

## 2023-10-06 NOTE — Progress Notes (Signed)
 Pharmacy Antibiotic Note  [name] [name] is a 89 y.o. male admitted on 10/06/2023 with pneumonia and sepsis.  Pharmacy has been consulted for cefepime and vancomycin dosing.  Received cefepime, metronidazole, and vancomycin on 4/11. WBC 14.4, Tmax 100.6, Scr 1.18 (CrCl 36 mL/min). CT compatible multilobar PNA.   Plan: Start vancomycin 750 mg IV every 24 hours (estAUC 505, Vd 0.72) Start cefepime 2g IV every 12 hours  Monitor renal fx, cx results, clinical pic, and vanc levels prn    Height: 6' (182.9 cm) Weight: 59.5 kg (131 lb 2.8 oz) IBW/kg (Calculated) : 77.6  Temp (24hrs), Avg:98.8 F (37.1 C), Min:95.2 F (35.1 C), Max:100.6 F (38.1 C)  Recent Labs  Lab 10/06/23 0918 10/06/23 0925 10/06/23 1311  WBC 14.4*  --   --   CREATININE 1.18  --   --   LATICACIDVEN  --  2.0* 0.8    Estimated Creatinine Clearance: 36.4 mL/min (by C-G formula based on SCr of 1.18 mg/dL).    Allergies  Allergen Reactions   Pork-Derived Products Other (See Comments)    Religious Reasons    Antimicrobials this admission: Vanc 4/11 >>  Cefepime 4/11 >>  Metronidazole 4/11 x1  Dose adjustments this admission: N/A  Microbiology results: 4/11 BCx: sent 4/11 Sputum: sent  4/11 COVID/Flu PCR: neg   Thank you for allowing pharmacy to participate in this patient's care,  Sherron Monday, PharmD, BCCCP Clinical Pharmacist  Phone: [phone] 10/06/2023 6:36 PM  Please check AMION for all [facility] Pharmacy phone numbers After 10:00 PM, call Main Pharmacy [phone]

## 2023-10-07 LAB — BASIC METABOLIC PANEL WITH GFR
Anion gap: 9 (ref 5–15)
BUN: 31 mg/dL — ABNORMAL HIGH (ref 8–23)
CO2: 21 mmol/L — ABNORMAL LOW (ref 22–32)
Calcium: 9 mg/dL (ref 8.9–10.3)
Chloride: 105 mmol/L (ref 98–111)
Creatinine, Ser: 1.14 mg/dL (ref 0.61–1.24)
GFR, Estimated: >60 mL/min (ref >60)
Glucose, Bld: 85 mg/dL (ref 70–99)
Potassium: 4.6 mmol/L (ref 3.5–5.1)
Sodium: 135 mmol/L (ref 135–145)

## 2023-10-07 LAB — EXPECTORATED SPUTUM ASSESSMENT W GRAM STAIN, RFLX TO RESP C

## 2023-10-07 LAB — BLOOD CULTURE ID PANEL (REFLEXED) - BCID2

## 2023-10-07 LAB — CBC
HCT: 23.2 % — ABNORMAL LOW (ref 39.0–52.0)
Hemoglobin: 7.3 g/dL — ABNORMAL LOW (ref 13.0–17.0)
MCH: 26.2 pg (ref 26.0–34.0)
MCHC: 31.5 g/dL (ref 30.0–36.0)
MCV: 83.2 fL (ref 80.0–100.0)
Platelets: 270 10*3/uL (ref 150–400)
RBC: 2.79 MIL/uL — ABNORMAL LOW (ref 4.22–5.81)
RDW: 17.3 % — ABNORMAL HIGH (ref 11.5–15.5)
WBC: 12.2 10*3/uL — ABNORMAL HIGH (ref 4.0–10.5)
nRBC: 0 % (ref 0.0–0.2)

## 2023-10-07 LAB — GLUCOSE, CAPILLARY
Glucose-Capillary: 96 mg/dL (ref 70–99)
Glucose-Capillary: 81 mg/dL (ref 70–99)
Glucose-Capillary: 94 mg/dL (ref 70–99)
Glucose-Capillary: 105 mg/dL — ABNORMAL HIGH (ref 70–99)

## 2023-10-07 MED ORDER — ORAL CARE MOUTH RINSE
15.0000 mL | OROMUCOSAL | Status: DC
  Administered 2023-10-07 – 2023-10-09 (×11): 15 mL via OROMUCOSAL

## 2023-10-07 MED ORDER — METOCLOPRAMIDE HCL 5 MG/ML IJ SOLN
5.0000 mg | Freq: Four times a day (QID) | INTRAMUSCULAR | Status: DC
  Administered 2023-10-07: 5 mg via INTRAVENOUS
  Filled 2023-10-07: qty 2

## 2023-10-07 MED ORDER — LEVOTHYROXINE SODIUM 100 MCG PO TABS
100.0000 ug | ORAL_TABLET | Freq: Every day | ORAL | Status: DC
  Administered 2023-10-08 – 2023-10-09 (×2): 100 ug
  Filled 2023-10-07 (×2): qty 1

## 2023-10-07 MED ORDER — OMEPRAZOLE 2 MG/ML ORAL SUSPENSION
20.0000 mg | Freq: Every day | ORAL | Status: DC
  Filled 2023-10-07: qty 10

## 2023-10-07 MED ORDER — METOCLOPRAMIDE HCL 5 MG/ML IJ SOLN
5.0000 mg | Freq: Four times a day (QID) | INTRAMUSCULAR | Status: AC
  Administered 2023-10-07 – 2023-10-08 (×3): 5 mg via INTRAVENOUS
  Filled 2023-10-07 (×3): qty 2

## 2023-10-07 MED ORDER — OMEPRAZOLE 20 MG PO TBDD
20.0000 mg | DELAYED_RELEASE_TABLET | Freq: Every day | ORAL | Status: DC
  Administered 2023-10-07: 20 mg via ORAL
  Filled 2023-10-07 (×2): qty 1

## 2023-10-07 MED ORDER — JEVITY 1.5 CAL/FIBER PO LIQD
1440.0000 mL | ORAL | Status: DC
  Administered 2023-10-07: 1000 mL
  Administered 2023-10-07: 1440 mL
  Administered 2023-10-08 – 2023-10-09 (×2): 1000 mL
  Filled 2023-10-07: qty 1659
  Filled 2023-10-07 (×3): qty 2000

## 2023-10-07 MED ORDER — ORAL CARE MOUTH RINSE: 15.0000 mL | OROMUCOSAL | Status: DC | PRN

## 2023-10-07 NOTE — Plan of Care (Signed)

## 2023-10-07 NOTE — Progress Notes (Signed)
 IR asked to eval G-tube for functionality. Hx of chronic G-tube, last had to be replaced 4/4.  Reported that pt having TF come up into mouth at SNF. CT and recent imaging reviewed. G-tube intact, good position. Contrast injection on 4/6 even shows contrast into proximal bowel suggesting normal gastric emptying.  Currently tube intact, site clean. TF running at 30/hr. RN reports TF has been running for about 12 hours with no evidence of TF in mouth or vomiting.  No recommendations from IR. Ok to cont use.  Prudence Brown PA-C Interventional Radiology 10/07/2023 9:37 AM

## 2023-10-07 NOTE — Progress Notes (Signed)
 PT Cancellation/Discharge Note  Patient Details Name: [name] [name] MRN: [id number] DOB: [birth year]   Cancelled Treatment:    Reason Eval/Treat Not Completed: PT screened, no needs identified, will sign off  Spoke with pt's son by phone [name]). Patient has been bedbound since this winter. If he tries to get up, he falls. They care for pt at bed-level, including feeding, bathing, and changing pt's "diapers." They have a hospital bed and have a "case worker" who is working to get them a wheelchair. He denies need for a lift and states he can easily move pt from bed to wheelchair. He denies any other DME needs. Patient is total care and not appropriate for PT at this time.    Gayle Kava, PT Acute Rehabilitation Services  Office [phone]  [place] 10/07/2023, 3:47 PM

## 2023-10-07 NOTE — Progress Notes (Signed)
 PHARMACY - PHYSICIAN COMMUNICATION CRITICAL VALUE ALERT - BLOOD CULTURE IDENTIFICATION (BCID)  [name] [name] is an 89 y.o. male who presented to [facility] on 10/06/2023 with a chief complaint of SOB  Assessment:  likely contaminant but on broad specABX anyway (include suspected source if known)  Name of physician (or Provider) Contacted: [place]  Current antibiotics: vancomycin + cefepime  Changes to prescribed antibiotics recommended:  Patient is on recommended antibiotics - No changes needed  Results for orders placed or performed during the hospital encounter of 10/06/23  Blood Culture ID Panel (Reflexed) (Collected: 10/06/2023  9:14 AM)  Result Value Ref Range   Enterococcus faecalis NOT DETECTED NOT DETECTED   Enterococcus Faecium NOT DETECTED NOT DETECTED   Listeria monocytogenes NOT DETECTED NOT DETECTED   Staphylococcus species DETECTED (A) NOT DETECTED   Staphylococcus aureus (BCID) NOT DETECTED NOT DETECTED   Staphylococcus epidermidis NOT DETECTED NOT DETECTED   Staphylococcus lugdunensis NOT DETECTED NOT DETECTED   Streptococcus species NOT DETECTED NOT DETECTED   Streptococcus agalactiae NOT DETECTED NOT DETECTED   Streptococcus pneumoniae NOT DETECTED NOT DETECTED   Streptococcus pyogenes NOT DETECTED NOT DETECTED   A.calcoaceticus-baumannii NOT DETECTED NOT DETECTED   Bacteroides fragilis NOT DETECTED NOT DETECTED   Enterobacterales NOT DETECTED NOT DETECTED   Enterobacter cloacae complex NOT DETECTED NOT DETECTED   Escherichia coli NOT DETECTED NOT DETECTED   Klebsiella aerogenes NOT DETECTED NOT DETECTED   Klebsiella oxytoca NOT DETECTED NOT DETECTED   Klebsiella pneumoniae NOT DETECTED NOT DETECTED   Proteus species NOT DETECTED NOT DETECTED   Salmonella species NOT DETECTED NOT DETECTED   Serratia marcescens NOT DETECTED NOT DETECTED   Haemophilus influenzae NOT DETECTED NOT DETECTED   Neisseria meningitidis NOT DETECTED NOT DETECTED   Pseudomonas aeruginosa NOT  DETECTED NOT DETECTED   Stenotrophomonas maltophilia NOT DETECTED NOT DETECTED   Candida albicans NOT DETECTED NOT DETECTED   Candida auris NOT DETECTED NOT DETECTED   Candida glabrata NOT DETECTED NOT DETECTED   Candida krusei NOT DETECTED NOT DETECTED   Candida parapsilosis NOT DETECTED NOT DETECTED   Candida tropicalis NOT DETECTED NOT DETECTED   Cryptococcus neoformans/gattii NOT DETECTED NOT DETECTED    [name] 10/07/2023  10:01 AM

## 2023-10-07 NOTE — Progress Notes (Addendum)
 PROGRESS NOTE    [name] [name]  MVH:[id number] DOB: April 20, 1934 DOA: 10/06/2023 PCP: Lawrance Presume, MD  Outpatient Specialists:     Brief Narrative:  Patient is an 89 year old male, skilled nursing facility resident, past medical history significant for PEG tube placement, hypertension, hypothyroidism, gout and BPH.  Patient was admitted with sepsis/aspiration pneumonia.  Patient is currently on IV vancomycin and cefepime.  Blood culture may be growing Staphylococcus species.  Will continue current antibiotics for now.  10/07/2023: Patient seen alongside patient's nurse.  Also updated patient's son, [name] [name] (phone #[phone] 09/28/1958).  No fever overnight.  Mild improvement in leukocytosis.  Patient's wife is stronger today.  Patient is producing thick yellowish phlegm.  Speech therapy is input is appreciated.  Patient has dysphagia, with likely poor prognosis.  Apparently, patient lives at home with family.   Assessment & Plan:   Principal Problem:   Aspiration pneumonia (HCC) Active Problems:   Sepsis (HCC)   Sepsis/aspiration pneumonia: -Patient has PEG tube in place. -Prior to presentation, tube feeds were noted to be in patient's oral cavity. -Chest x-ray all suggestive of left lung pneumonia, and CT chest suggestive of multifocal pneumonia. -Aspiration precautions. -Continue IV vancomycin and cefepime. -Aspiration precautions. -Follow final culture results.   -Speech therapist input is appreciated.     Cachexia/malnutrition (moderate to severe): -Status post PEG tube placement. -Continue tube feeds. -Continue to monitor for residuals.   -Podiatry team input is highly appreciated.    -Albumin of 2.2 -BMI of 17.79.   Anemia: -Chronic. -Likely multifactorial. -Patient is iron deficient. -Patient is vitamin B12 deficient.  Last level checked on 09/21/2023 was 143.  Continue supplemental B12. -Folate of 10.7.      Hyponatremia: -Sodium of 135 today. -Continue to monitor  closely.  Hypothyroidism: -Increase Synthroid to 100 mcg daily, via PEG tube.  DVT prophylaxis: Subcutaneous Arixtra. Code Status: Full code. Family Communication: Son, [name] [name] Disposition Plan: Patient remains very ill.   Consultants:  None.  Procedures:  None.  Antimicrobials:  IV vancomycin. IV cefepime.   Subjective: -No significant history.  However, patient is more communicative today.  Objective: Vitals:   10/07/23 0303 10/07/23 0532 10/07/23 0751 10/07/23 1200  BP: (!) 135/53  (!) 130/59 (!) 143/55  Pulse: 82 79 81 88  Resp: (!) 21 (!) 29 20 20   Temp: 99.7 F (37.6 C)  98.2 F (36.8 C) 98.4 F (36.9 C)  TempSrc:   Oral Oral  SpO2: 98% 98% 99% 97%  Weight:  63.2 kg    Height:        Intake/Output Summary (Last 24 hours) at 10/07/2023 1447 Last data filed at 10/07/2023 1400 Gross per 24 hour  Intake 2359.9 ml  Output 650 ml  Net 1709.9 ml   Filed Weights   10/06/23 1000 10/06/23 1549 10/07/23 0532  Weight: 51.1 kg 59.5 kg 63.2 kg    Examination:  General exam: Appears cachectic and chronically ill.  Not in any distress.     Respiratory system: Decreased air entry.  Rhonchi left lung field. Cardiovascular system: S1 & S2 heard Gastrointestinal system: Abdomen is soft and nontender. Central nervous system: Awake and alert.   Extremities: Mild bilateral ankle edema.  Data Reviewed: I have personally reviewed following labs and imaging studies  CBC: Recent Labs  Lab 10/06/23 0918 10/07/23 0214  WBC 14.4* 12.2*  NEUTROABS 12.7*  --   HGB 8.0* 7.3*  HCT 25.8* 23.2*  MCV 86.3 83.2  PLT 338 270  Basic Metabolic Panel: Recent Labs  Lab 10/06/23 0918 10/06/23 1906 10/07/23 0214  NA 134*  --  135  K 4.8  --  4.6  CL 101  --  105  CO2 20*  --  21*  GLUCOSE 91  --  85  BUN 37*  --  31*  CREATININE 1.18  --  1.14  CALCIUM 9.2  --  9.0  MG  --  1.9  --   PHOS  --  2.8  --    GFR: Estimated Creatinine Clearance: 40 mL/min (by C-G  formula based on SCr of 1.14 mg/dL). Liver Function Tests: Recent Labs  Lab 10/06/23 0918  AST 32  ALT 26  ALKPHOS 68  BILITOT 0.4  PROT 7.3  ALBUMIN 2.2*   No results for input(s): "LIPASE", "AMYLASE" in the last 168 hours. No results for input(s): "AMMONIA" in the last 168 hours. Coagulation Profile: Recent Labs  Lab 10/06/23 0918  INR 1.2   Cardiac Enzymes: No results for input(s): "CKTOTAL", "CKMB", "CKMBINDEX", "TROPONINI" in the last 168 hours. BNP (last 3 results) No results for input(s): "PROBNP" in the last 8760 hours. HbA1C: No results for input(s): "HGBA1C" in the last 72 hours. CBG: Recent Labs  Lab 10/06/23 2124 10/06/23 2149 10/07/23 0640 10/07/23 1125  GLUCAP 56* 146* 96 81   Lipid Profile: No results for input(s): "CHOL", "HDL", "LDLCALC", "TRIG", "CHOLHDL", "LDLDIRECT" in the last 72 hours. Thyroid Function Tests: Recent Labs    10/06/23 0918  TSH 54.339*   Anemia Panel: No results for input(s): "VITAMINB12", "FOLATE", "FERRITIN", "TIBC", "IRON", "RETICCTPCT" in the last 72 hours. Urine analysis:    Component Value Date/Time   COLORURINE YELLOW 10/06/2023 0947   APPEARANCEUR HAZY (A) 10/06/2023 0947   LABSPEC 1.012 10/06/2023 0947   PHURINE 5.0 10/06/2023 0947   GLUCOSEU NEGATIVE 10/06/2023 0947   HGBUR NEGATIVE 10/06/2023 0947   BILIRUBINUR NEGATIVE 10/06/2023 0947   KETONESUR NEGATIVE 10/06/2023 0947   PROTEINUR NEGATIVE 10/06/2023 0947   UROBILINOGEN 0.2 12/03/2021 1921   NITRITE NEGATIVE 10/06/2023 0947   LEUKOCYTESUR NEGATIVE 10/06/2023 0947   Sepsis Labs: @LABRCNTIP (procalcitonin:4,lacticidven:4)  ) Recent Results (from the past 240 hours)  Resp panel by RT-PCR (RSV, Flu A&B, Covid) Anterior Nasal Swab     Status: None   Collection Time: 10/06/23  9:09 AM   Specimen: Anterior Nasal Swab  Result Value Ref Range Status   SARS Coronavirus 2 by RT PCR NEGATIVE NEGATIVE Final   Influenza A by PCR NEGATIVE NEGATIVE Final    Influenza B by PCR NEGATIVE NEGATIVE Final    Comment: (NOTE) The Xpert Xpress SARS-CoV-2/FLU/RSV plus assay is intended as an aid in the diagnosis of influenza from Nasopharyngeal swab specimens and should not be used as a sole basis for treatment. Nasal washings and aspirates are unacceptable for Xpert Xpress SARS-CoV-2/FLU/RSV testing.  Fact Sheet for Patients: BloggerCourse.com  Fact Sheet for Healthcare Providers: SeriousBroker.it  This test is not yet approved or cleared by the [place]  FDA and has been authorized for detection and/or diagnosis of SARS-CoV-2 by FDA under an Emergency Use Authorization (EUA). This EUA will remain in effect (meaning this test can be used) for the duration of the COVID-19 declaration under Section 564(b)(1) of the Act, 21 U.S.C. section 360bbb-3(b)(1), unless the authorization is terminated or revoked.     Resp Syncytial Virus by PCR NEGATIVE NEGATIVE Final    Comment: (NOTE) Fact Sheet for Patients: BloggerCourse.com  Fact Sheet for Healthcare Providers: SeriousBroker.it  This  test is not yet approved or cleared by the [place]  FDA and has been authorized for detection and/or diagnosis of SARS-CoV-2 by FDA under an Emergency Use Authorization (EUA). This EUA will remain in effect (meaning this test can be used) for the duration of the COVID-19 declaration under Section 564(b)(1) of the Act, 21 U.S.C. section 360bbb-3(b)(1), unless the authorization is terminated or revoked.  Performed at [facility], [address], [place], [place] [address]   Blood Culture (routine x 2)     Status: None (Preliminary result)   Collection Time: 10/06/23  9:14 AM   Specimen: BLOOD  Result Value Ref Range Status   Specimen Description BLOOD SITE NOT SPECIFIED  Final   Special Requests   Final    BOTTLES DRAWN AEROBIC AND ANAEROBIC Blood  Culture results may not be optimal due to an inadequate volume of blood received in culture bottles   Culture  Setup Time   Final    GRAM POSITIVE COCCI AEROBIC BOTTLE ONLY CRITICAL RESULT CALLED TO, READ BACK BY AND VERIFIED WITH: PHARMD MICHAEL BITONTI [id number] AT 1001 BY EC Performed at [facility], [address] [place], [place] [address]    Culture GRAM POSITIVE COCCI  Final   Report Status PENDING  Incomplete  Blood Culture ID Panel (Reflexed)     Status: Abnormal   Collection Time: 10/06/23  9:14 AM  Result Value Ref Range Status   Enterococcus faecalis NOT DETECTED NOT DETECTED Final   Enterococcus Faecium NOT DETECTED NOT DETECTED Final   Listeria monocytogenes NOT DETECTED NOT DETECTED Final   Staphylococcus species DETECTED (A) NOT DETECTED Final    Comment: CRITICAL RESULT CALLED TO, READ BACK BY AND VERIFIED WITH: PHARMD MICAHEL BITONTI [id number] AT 1001 BY EC    Staphylococcus aureus (BCID) NOT DETECTED NOT DETECTED Final   Staphylococcus epidermidis NOT DETECTED NOT DETECTED Final   Staphylococcus lugdunensis NOT DETECTED NOT DETECTED Final   Streptococcus species NOT DETECTED NOT DETECTED Final   Streptococcus agalactiae NOT DETECTED NOT DETECTED Final   Streptococcus pneumoniae NOT DETECTED NOT DETECTED Final   Streptococcus pyogenes NOT DETECTED NOT DETECTED Final   A.calcoaceticus-baumannii NOT DETECTED NOT DETECTED Final   Bacteroides fragilis NOT DETECTED NOT DETECTED Final   Enterobacterales NOT DETECTED NOT DETECTED Final   Enterobacter cloacae complex NOT DETECTED NOT DETECTED Final   Escherichia coli NOT DETECTED NOT DETECTED Final   Klebsiella aerogenes NOT DETECTED NOT DETECTED Final   Klebsiella oxytoca NOT DETECTED NOT DETECTED Final   Klebsiella pneumoniae NOT DETECTED NOT DETECTED Final   Proteus species NOT DETECTED NOT DETECTED Final   Salmonella species NOT DETECTED NOT DETECTED Final   Serratia marcescens NOT DETECTED NOT DETECTED Final    Haemophilus influenzae NOT DETECTED NOT DETECTED Final   Neisseria meningitidis NOT DETECTED NOT DETECTED Final   Pseudomonas aeruginosa NOT DETECTED NOT DETECTED Final   Stenotrophomonas maltophilia NOT DETECTED NOT DETECTED Final   Candida albicans NOT DETECTED NOT DETECTED Final   Candida auris NOT DETECTED NOT DETECTED Final   Candida glabrata NOT DETECTED NOT DETECTED Final   Candida krusei NOT DETECTED NOT DETECTED Final   Candida parapsilosis NOT DETECTED NOT DETECTED Final   Candida tropicalis NOT DETECTED NOT DETECTED Final   Cryptococcus neoformans/gattii NOT DETECTED NOT DETECTED Final    Comment: Performed at [facility], [address], [place], [place] [address]  Blood Culture (routine x 2)     Status: None (Preliminary result)  Collection Time: 10/06/23  9:20 AM   Specimen: BLOOD  Result Value Ref Range Status   Specimen Description BLOOD SITE NOT SPECIFIED  Final   Special Requests   Final    BOTTLES DRAWN AEROBIC AND ANAEROBIC Blood Culture adequate volume   Culture   Final    NO GROWTH < 24 HOURS Performed at [facility], [address] [place], [place] [address]    Report Status PENDING  Incomplete         Radiology Studies: CT CHEST ABDOMEN PELVIS W CONTRAST Result Date: 10/06/2023 CLINICAL DATA:  Sepsis with abdominal tenderness and very bad sounding lungs. Also possible concern that every time the G-tube is used it comes out of his mouth and aspirates. EXAM: CT CHEST, ABDOMEN, AND PELVIS WITH CONTRAST TECHNIQUE: Multidetector CT imaging of the chest, abdomen and pelvis was performed following the standard protocol during bolus administration of intravenous contrast. RADIATION DOSE REDUCTION: This exam was performed according to the departmental dose-optimization program which includes automated exposure control, adjustment of the mA and/or kV according to patient size and/or use of iterative reconstruction technique. CONTRAST:  75mL OMNIPAQUE  IOHEXOL 350 MG/ML SOLN COMPARISON:  CT scan chest, abdomen and pelvis from 09/20/2023.  Of FINDINGS: CT CHEST FINDINGS Cardiovascular: Normal cardiac size. No pericardial effusion. No aortic aneurysm. There are coronary artery calcifications, in keeping with coronary artery disease. There are also mild peripheral atherosclerotic vascular calcifications of thoracic aorta and its major branches. There is dilation of the main pulmonary trunk measuring up to 3.5 cm, which is nonspecific but can be seen with pulmonary artery hypertension. Mediastinum/Nodes: Visualized thyroid gland appears grossly unremarkable. No solid / cystic mediastinal masses. The esophagus is nondistended precluding optimal assessment. No axillary, mediastinal or hilar lymphadenopathy by size criteria. Lungs/Pleura: The central tracheo-bronchial tree is patent. There are heterogeneous opacities throughout the left lung lower lobe as well as apicoposterior segment of left upper lobe, without volume loss, compatible with multilobar pneumonia. There are also patchy opacities in the right lung lower lobe, which may represent additional focus of consolidation. There is a small grouping of tree-in-bud configuration nodules in the posterior segment of right upper lobe. There is small left pleural effusion. No right pleural effusion. No pneumothorax. No suspicious lung nodule. Musculoskeletal: The visualized soft tissues of the chest wall are grossly unremarkable. No suspicious osseous lesions. There are moderate to severe multilevel degenerative changes in the visualized spine. CT ABDOMEN PELVIS FINDINGS Hepatobiliary: The liver is normal in size. Non-cirrhotic configuration. No suspicious mass. No intrahepatic or extrahepatic bile duct dilation. No calcified gallstones. Normal gallbladder wall thickness. No pericholecystic inflammatory changes. Pancreas: Unremarkable. No pancreatic ductal dilatation or surrounding inflammatory changes. Spleen: Within  normal limits. No focal lesion. Adrenals/Urinary Tract: Adrenal glands are unremarkable. Mild diffuse atrophy of bilateral kidneys. No suspicious renal mass. No hydronephrosis. No renal or ureteric calculi. Unremarkable urinary bladder. Stomach/Bowel: Gastrostomy tube noted in the left paramedian epigastric region with its balloon inflated in the distal body of the stomach. No disproportionate dilation of the small or large bowel loops. No evidence of abnormal bowel wall thickening or inflammatory changes. The appendix was not visualized; however there is no acute inflammatory process in the right lower quadrant. Vascular/Lymphatic: No ascites or pneumoperitoneum. No abdominal or pelvic lymphadenopathy, by size criteria. No aneurysmal dilation of the major abdominal arteries. There are moderate peripheral atherosclerotic vascular calcifications of the aorta and its major branches. Reproductive: Enlarged prostate. Symmetric seminal vesicles. Other: There is probable  retractile left testicle which is in the superficial portion of left inguinal canal. The visualized soft tissues and abdominal wall are otherwise unremarkable. Musculoskeletal: No suspicious osseous lesions. There are moderate - marked multilevel degenerative changes in the visualized spine. IMPRESSION: 1. There are heterogeneous opacities throughout the left lung lower lobe as well as apicoposterior segment of left upper lobe, without volume loss, compatible with multilobar pneumonia. There are also patchy opacities in the right lung lower lobe, which may represent additional focus of consolidation. There is a small grouping of tree-in-bud configuration nodules in the posterior segment of right upper lobe, favoring infectious/inflammatory bronchiolitis. 2. No acute inflammatory process identified within the abdomen or pelvis. 3. Multiple other nonacute observations, as described above. Aortic Atherosclerosis (ICD10-I70.0). Electronically Signed   By:  Beula Brunswick M.D.   On: 10/06/2023 12:51   DG Chest Port 1 View Result Date: 10/06/2023 CLINICAL DATA:  Sepsis. EXAM: PORTABLE CHEST 1 VIEW COMPARISON:  September 20, 2023. FINDINGS: Stable cardiomediastinal silhouette. Old proximal left humeral fracture is noted. Minimal right basilar subsegmental atelectasis or scarring is noted. Left perihilar and basilar opacities are noted concerning for possible atelectasis or infiltrate with small left pleural effusion. IMPRESSION: Left lung opacities are noted concerning for pneumonia or atelectasis with small left pleural effusion. Electronically Signed   By: Rosalene Colon M.D.   On: 10/06/2023 09:52        Scheduled Meds:  cyanocobalamin  1,000 mcg Per Tube Daily   ferrous sulfate  300 mg Per Tube QODAY   fondaparinux (ARIXTRA) injection  2.5 mg Subcutaneous Q24H   free water  300 mL Per Tube Q6H   levothyroxine  75 mcg Per Tube QAC breakfast   metoCLOPramide (REGLAN) injection  5 mg Intravenous Q6H   multivitamin with minerals  1 tablet Per Tube Daily   omeprazole  20 mg Oral Daily   mouth rinse  15 mL Mouth Rinse 4 times per day   Continuous Infusions:  ceFEPime (MAXIPIME) IV Stopped (10/07/23 0956)   feeding supplement (JEVITY 1.5 CAL/FIBER) 40 mL/hr at 10/07/23 1309   vancomycin Stopped (10/07/23 1147)     LOS: 1 day    Time spent: 55 minutes.    Fonnie Iba, MD  [facility] Pager #: [phone] 7PM-7AM contact night coverage as above

## 2023-10-07 NOTE — Evaluation (Signed)
 Clinical/Bedside Swallow Evaluation Patient Details  Name: [name] [name] MRN: [id number] Date of Birth: 12-06-1933  Today's Date: 10/07/2023 Time: SLP Start Time (ACUTE ONLY): 0753 SLP Stop Time (ACUTE ONLY): 0807 SLP Time Calculation (min) (ACUTE ONLY): 14 min  Past Medical History:  Past Medical History:  Diagnosis Date   Back pain 10/2008   per MRI 11/06/2008 - severe spinal stenosis worse at L4-5, L3-4, with neural compression at those levels   BPH (benign prostatic hyperplasia)    Eczema    Gout    Hypertension    Hypothyroidism    Past Surgical History:  Past Surgical History:  Procedure Laterality Date   ESOPHAGOGASTRODUODENOSCOPY N/A 04/03/2022   Procedure: ESOPHAGOGASTRODUODENOSCOPY (EGD);  Surgeon: Alvis Jourdain, MD;  Location: [facility];  Service: Gastroenterology;  Laterality: N/A;   IR REPLC GASTRO/COLONIC TUBE PERCUT W/FLUORO  09/29/2023   LAMINECTOMY  12/11/2008    Bilateral L2, L3, and L4 laminotomies, done by Dr. Larrie Po   LAPAROSCOPY N/A 04/06/2022   Procedure: LAPAROSCOPIC ASSISTED PERCUTANEOUS ENDOSCOPIC GASTROSTOMY PLACEMENT;  Surgeon: Dareen Ebbing, MD;  Location: [facility] OR;  Service: General;  Laterality: N/A;   HPI:  Patient is an 89 year old male, skilled nursing facility resident, past medical history significant for PEG tube placement, hypertension, hypothyroidism, gout and BPH.  Patient is not able to give any history, despite involvement of an Arabic interpreter.  Patient has PEG tube that has been noted to be malfunctioning, with a lot of the tube feed noted to be in patient's mouth.  Patient was transferred to the emergency room with shortness of breath, fever, tachycardia, leukocytosis with left shift and hypotension (blood pressure of 95/48 mmHg).  Lactic acid was initially 2, but improved to 0.8.  Chest x-ray revealed left lung opacity, concerning for pneumonia, with small left pleural effusion.  CT scan of the chest/abdomen/pelvis revealed multilobar  pneumonia.  He was diagnosed with sepsis secondary to aspiration pneumonia, cachexia/malnutrition, anemia and hyponatremia.    Assessment / Plan / Recommendation  Clinical Impression  Clinical swallowing evaluation was completed. Patient is well known to ST service. He had MBS October 2023 which led to a PEG tube placement.  Recently he had a clinical swallowing evaluation March 2025 with recommendation for him to remain NPO. RN approved patient for PO intake. Interpreter IPad was utilized ie Shaimaa [id number] for entire visit.  Cranial nerve exam was unable to be completed due to his inability to follow commands.  He presented with ongoing oropharyngeal dysphagia.  A/P transport was slow.  Swallow trigger was appreciated to palpation.  Patient noted to have wet vocal quality post swallow with cough response across all textures. Multiple swallows were noted on pureed material.  Suggest patient remain NPO with consideration for palliative care consult for discussions regarding goals of care.  ST follow up is not indicated. If we can be of further assistance please reconsult. SLP Visit Diagnosis: Dysphagia, oropharyngeal phase (R13.12)    Aspiration Risk  Severe aspiration risk    Diet Recommendation   NPO  Medication Administration: Via alternative means    Other  Recommendations Oral Care Recommendations: Oral care QID    Recommendations for follow up therapy are one component of a multi-disciplinary discharge planning process, led by the attending physician.  Recommendations may be updated based on patient status, additional functional criteria and insurance authorization.  Follow up Recommendations No SLP follow up         Functional Status Assessment Patient has had a recent decline in their functional  status and/or demonstrates limited ability to make significant improvements in function in a reasonable and predictable amount of time         Prognosis Prognosis for improved oropharyngeal  function: Guarded Barriers to Reach Goals: Time post onset;Cognitive deficits;Severity of deficits      Swallow Study   General Date of Onset: 10/06/23 HPI: Patient is an 89 year old male, skilled nursing facility resident, past medical history significant for PEG tube placement, hypertension, hypothyroidism, gout and BPH.  Patient is not able to give any history, despite involvement of an Arabic interpreter.  Patient has PEG tube that has been noted to be malfunctioning, with a lot of the tube feed noted to be in patient's mouth.  Patient was transferred to the emergency room with shortness of breath, fever, tachycardia, leukocytosis with left shift and hypotension (blood pressure of 95/48 mmHg).  Lactic acid was initially 2, but improved to 0.8.  Chest x-ray revealed left lung opacity, concerning for pneumonia, with small left pleural effusion.  CT scan of the chest/abdomen/pelvis revealed multilobar pneumonia.  He was diagnosed with sepsis secondary to aspiration pneumonia, cachexia/malnutrition, anemia and hyponatremia. Type of Study: Bedside Swallow Evaluation Previous Swallow Assessment: MBSS 03/2022; CSE 09/21/23 Rx NPO Diet Prior to this Study: NPO Temperature Spikes Noted: Yes Respiratory Status: Nasal cannula History of Recent Intubation: No Behavior/Cognition: Alert;Doesn't follow directions;Confused Oral Cavity Assessment: Within Functional Limits Oral Care Completed by SLP: Yes Oral Cavity - Dentition: Poor condition Self-Feeding Abilities: Total assist Patient Positioning: Upright in bed Baseline Vocal Quality: Low vocal intensity Volitional Cough: Cognitively unable to elicit Volitional Swallow: Unable to elicit    Oral/Motor/Sensory Function Overall Oral Motor/Sensory Function: Other (comment) (Unable to assess)   Ice Chips Ice chips: Impaired Presentation: Spoon Oral Phase Impairments: Impaired mastication Oral Phase Functional Implications: Prolonged oral  transit Pharyngeal Phase Impairments: Wet Vocal Quality;Cough - Immediate   Thin Liquid Thin Liquid: Impaired Presentation: Spoon Oral Phase Impairments: Impaired mastication Oral Phase Functional Implications: Prolonged oral transit Pharyngeal  Phase Impairments: Wet Vocal Quality;Cough - Immediate    Nectar Thick Nectar Thick Liquid: Not tested   Honey Thick Honey Thick Liquid: Not tested   Puree Puree: Impaired Presentation: Spoon Oral Phase Impairments: Impaired mastication Oral Phase Functional Implications: Prolonged oral transit Pharyngeal Phase Impairments: Multiple swallows;Wet Vocal Quality;Cough - Immediate   Solid     Solid: Not tested     Thomes Flicker, MA, CCC-SLP Acute Rehab SLP [phone]  Elner Hahn 10/07/2023,8:58 AM

## 2023-10-07 NOTE — Progress Notes (Addendum)
 Initial Nutrition Assessment  DOCUMENTATION CODES:   Not applicable; patient identified to have severe malnutrition last admission, suspect this is ongoing. Unable to obtain enough information at this time for identification of malnutrition.   INTERVENTION:   Tube feeding via G-tube: Jevity 1.5 at 60 ml/h (1440 ml per day) Provides 2160 kcal, 92 gm protein, 1094 ml free water daily. Free water flushes 300 ml q6h for a total of 2294 ml free water daily (TF+flushes).  Continue Reglan as prokinetic agent.  If in line with goals of care, consider converting G tube into a G-J tube which may help decrease vomiting and aspiration risk.   NUTRITION DIAGNOSIS:   Inadequate oral intake related to inability to eat as evidenced by NPO status.  GOAL:   Patient will meet greater than or equal to 90% of their needs  MONITOR:   TF tolerance  REASON FOR ASSESSMENT:   Consult Enteral/tube feeding initiation and management, Assessment of nutrition requirement/status  ASSESSMENT:   89 yo male admitted with sepsis, aspiration PNA. PMH includes HTN, hypothyroidism, BPH, gout, eczema, dysphagia, PEG, no pork products d/t religious preference.  S/P bedside swallow evaluation with SLP this morning. SLP recommends continuing NPO status with ongoing severe aspiration risk.   G-tube was replaced last admission on 4/4. This admission, IR evaluated G-tube and found it to be in good position. Imaging last admission with contrast on 4/6 showed normal gastric emptying.   Reglan added this admission.   Per discussion with RN, patient has been receiving Jevity 1.5 at 30 ml/h overnight since admission yesterday d/t concern for vomiting TF. He has tolerated it well so far. Will increase to goal rate of 60 ml/h.  Given suspected vomiting of TF leading to aspiration, patient may benefit from conversion of G tube to G-J tube to potentially decrease vomiting and aspiration risk.   Labs reviewed.  CBG:  [phone]  Medications reviewed and include vitamin B-12, ferrous sulfate, reglan, MVI with minerals, omeprazole, IV vancomycin.  Usual weights reviewed.   51.1 kg last admission (15 days ago) 63.2 kg today Question accuracy of weights, need to obtain accurate weights to determine adequacy of TF.   NUTRITION - FOCUSED PHYSICAL EXAM:  Unable to complete  Diet Order:   Diet Order             Diet NPO time specified  Diet effective now                   EDUCATION NEEDS:   No education needs have been identified at this time  Skin:  Skin Assessment: Skin Integrity Issues: Skin Integrity Issues:: DTI DTI: Left thigh  Last BM:  no BM documented this admission  Height:   Ht Readings from Last 1 Encounters:  10/06/23 6' (1.829 m)    Weight:   Wt Readings from Last 1 Encounters:  10/07/23 63.2 kg    Ideal Body Weight:  80.9 kg  BMI:  Body mass index is 18.9 kg/m.  Estimated Nutritional Needs:   Kcal:  1800-2000  Protein:  90-110 gm  Fluid:  1.8-2 L   Barnet Boots RD, LDN, CNSC Contact via secure chat. If unavailable, use group chat "RD Inpatient."

## 2023-10-08 DIAGNOSIS — J189 Pneumonia, unspecified organism: Secondary | ICD-10-CM

## 2023-10-08 DIAGNOSIS — A419 Sepsis, unspecified organism: Secondary | ICD-10-CM

## 2023-10-08 DIAGNOSIS — Z7189 Other specified counseling: Secondary | ICD-10-CM

## 2023-10-08 DIAGNOSIS — Z515 Encounter for palliative care: Secondary | ICD-10-CM

## 2023-10-08 LAB — GLUCOSE, CAPILLARY
Glucose-Capillary: 110 mg/dL — ABNORMAL HIGH (ref 70–99)
Glucose-Capillary: 100 mg/dL — ABNORMAL HIGH (ref 70–99)
Glucose-Capillary: 109 mg/dL — ABNORMAL HIGH (ref 70–99)

## 2023-10-08 LAB — CBC WITH DIFFERENTIAL/PLATELET
Abs Immature Granulocytes: 0.04 10*3/uL (ref 0.00–0.07)
Basophils Absolute: 0 10*3/uL (ref 0.0–0.1)
Basophils Relative: 0 %
Eosinophils Absolute: 0 10*3/uL (ref 0.0–0.5)
Eosinophils Relative: 0 %
HCT: 24 % — ABNORMAL LOW (ref 39.0–52.0)
Hemoglobin: 7.5 g/dL — ABNORMAL LOW (ref 13.0–17.0)
Immature Granulocytes: 1 %
Lymphocytes Relative: 12 %
Lymphs Abs: 1 10*3/uL (ref 0.7–4.0)
MCH: 26.1 pg (ref 26.0–34.0)
MCHC: 31.3 g/dL (ref 30.0–36.0)
MCV: 83.6 fL (ref 80.0–100.0)
Monocytes Absolute: 0.4 10*3/uL (ref 0.1–1.0)
Monocytes Relative: 5 %
Neutro Abs: 6.2 10*3/uL (ref 1.7–7.7)
Neutrophils Relative %: 82 %
Platelets: 263 10*3/uL (ref 150–400)
RBC: 2.87 MIL/uL — ABNORMAL LOW (ref 4.22–5.81)
RDW: 17.6 % — ABNORMAL HIGH (ref 11.5–15.5)
WBC: 7.7 10*3/uL (ref 4.0–10.5)
nRBC: 0 % (ref 0.0–0.2)

## 2023-10-08 LAB — MAGNESIUM: Magnesium: 2 mg/dL (ref 1.7–2.4)

## 2023-10-08 LAB — RENAL FUNCTION PANEL
Albumin: 1.8 g/dL — ABNORMAL LOW (ref 3.5–5.0)
Anion gap: 9 (ref 5–15)
BUN: 34 mg/dL — ABNORMAL HIGH (ref 8–23)
CO2: 20 mmol/L — ABNORMAL LOW (ref 22–32)
Calcium: 9.2 mg/dL (ref 8.9–10.3)
Chloride: 106 mmol/L (ref 98–111)
Creatinine, Ser: 1.1 mg/dL (ref 0.61–1.24)
GFR, Estimated: >60 mL/min (ref >60)
Glucose, Bld: 125 mg/dL — ABNORMAL HIGH (ref 70–99)
Phosphorus: 2.3 mg/dL — ABNORMAL LOW (ref 2.5–4.6)
Potassium: 4.4 mmol/L (ref 3.5–5.1)
Sodium: 135 mmol/L (ref 135–145)

## 2023-10-08 MED ORDER — PANTOPRAZOLE SODIUM 40 MG IV SOLR
40.0000 mg | INTRAVENOUS | Status: DC
  Administered 2023-10-08 – 2023-10-09 (×2): 40 mg via INTRAVENOUS
  Filled 2023-10-08 (×2): qty 10

## 2023-10-08 MED ORDER — ACETAMINOPHEN 160 MG/5ML PO SOLN
650.0000 mg | Freq: Four times a day (QID) | ORAL | Status: DC | PRN
  Administered 2023-10-08 – 2023-10-09 (×2): 650 mg
  Filled 2023-10-08 (×2): qty 20.3

## 2023-10-08 MED ORDER — LIDOCAINE 5 % EX PTCH
1.0000 | MEDICATED_PATCH | CUTANEOUS | Status: DC
  Administered 2023-10-08 – 2023-10-09 (×2): 1 via TRANSDERMAL
  Filled 2023-10-08 (×2): qty 1

## 2023-10-08 MED ORDER — POTASSIUM & SODIUM PHOSPHATES 280-160-250 MG PO PACK
1.0000 | PACK | Freq: Three times a day (TID) | ORAL | Status: AC
  Administered 2023-10-08 (×3): 1
  Filled 2023-10-08 (×3): qty 1

## 2023-10-08 NOTE — Consult Note (Cosign Needed Addendum)
 Palliative Medicine Inpatient Consult Note  Reason for consult:  Goals of care and code status  HPI: [name] is a 89 year old male with medical history significant for hypertension, hypothyroidism, gout, BPH and PEG tube placement. Family shares that hs has been debilitated since he had COVID and Salmonella previously. They have been caring for him at home. He was recently discharged and pulled out his feeding tube at home. The previous one was stitched in per his son. He has had tube feeding in his mouth. He had shortness of breath prior to coming to the ED where he had fever and tachycardia with lab work showing leukocytosis and left shift with hypotension. Lactic acid was initially 2 . Chest x-ray revealed left lung opacity concerning for pneumonia with small left pleural effusion. CT scan was performed of the chest, abdomen and pelvis revealing multilobar pneumonia. He is being treated with Vancomycin and cefepime. Admitted for sepsis and possible aspiration pneumonia.    Clinical Assessment/Goals of Care: I have reviewed medical records including EPIC notes, labs and imaging, and assessed the patient.    I met with his son, [name] [name]  to further discuss diagnosis prognosis, GOC, EOL wishes, disposition and options.   I introduced Palliative Medicine as specialized medical care for people living with serious illness. It focuses on providing relief from the symptoms and stress of a serious illness. The goal is to improve quality of life for both the patient and the family.  [name] [name] is from [place]. He has been in the [place]  off and on since 2011 but is too ill to travel back there. He and his wife live with their son and his wife who provide his care.  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  He practices the Muslim religion but is exempt from prayers while ill.   A detailed discussion was had today regarding advanced directives.  Concepts specific to code status, artifical feeding and  hydration, continued IV antibiotics and rehospitalization was had.  The difference between a aggressive medical intervention path  and a palliative comfort care path for this patient at this time was had. Values and goals of care important to patient and family were attempted to be elicited.  His son states he did well until he had COVID and Salmonella. At home he had lots of difficulty with dysphagia and the feeding tube was placed for this. Mr. Ortwein has no advanced directive and decisions are made by the family. By following their religious beliefs they allow God to make all the decisions and would not restrict medical interventions. He should remain a full code.  At home they have a hospital bed provided by the hospital at last admission, and have details on where to get the wheelchair. He was home only briefly before readmission. He has approval for tube feedings. Medicaid was applied during the last admission.   Symptoms to manage: Denies pain. Breathing not labored.  Discussed the importance of continued conversation with family and their  medical providers regarding overall plan of care and treatment options, ensuring decisions are within the context of the patient's values and GOCs.  Palliative medicine will follow his chart. Family has our number if they need to reach out.   Decision Maker: Family- son, daughter [name] [name] and wife  SUMMARY OF RECOMMENDATIONS    Code Status/Advance Care Planning: FULL CODE  Additional Recommendations (Limitations, Scope, Preferences): Full scope of treatment Palliative to chart follow    Psycho-social/Spiritual:  Desire for further Chaplaincy support: no Additional Recommendations:    Prognosis: poor, failure to thrive, sepsis, pneumonia.   Discharge Planning:  Plan for discharge to home with family as caregivers.  Review of Systems  Unable to perform ROS: Medical condition       10/08/2023   10:59 AM 10/08/2023    7:40 AM 10/08/2023    4:50 AM   Vitals with BMI  Weight   131 lbs 6 oz  BMI   17.82  Systolic 156 136 161  Diastolic 81 66 84  Pulse   90    Physical Exam Vitals and nursing note reviewed.  Constitutional:      General: He is not in acute distress.    Appearance: Normal appearance.  HENT:     Head: Normocephalic and atraumatic.     Mouth/Throat:     Mouth: Mucous membranes are moist.     Pharynx: Oropharynx is clear.  Cardiovascular:     Rate and Rhythm: Normal rate and regular rhythm.     Pulses: Normal pulses.     Heart sounds: Normal heart sounds.  Pulmonary:     Effort: Pulmonary effort is normal.     Comments: Decreaed BS in bases Abdominal:     General: Abdomen is flat.     Palpations: Abdomen is soft.     Comments: Feeding tube  Skin:    General: Skin is warm and dry.     Capillary Refill: Capillary refill takes less than 2 seconds.  Neurological:     Mental Status: He is alert.     Comments: Speaks some English words  Psychiatric:        Mood and Affect: Mood normal.     Comments: smiles       10/08/2023   10:59 AM 10/08/2023    7:40 AM 10/08/2023    4:50 AM  Vitals with BMI  Weight   131 lbs 6 oz  BMI   17.82  Systolic 156 136 096  Diastolic 81 66 84  Pulse   90    PPS: 30%   This conversation/these recommendations were discussed with patient primary care team, Dr. Sandria Cruise  Thank you for the opportunity to participate in the care of this patient and family.   Total Time: 95 minutes Greater than 50%  of this time was spent counseling and coordinating care related to the above assessment and plan.  Lilyan Remedies, NP [name] Palliative Medicine Team Team Cell Phone: [phone] Please utilize secure chat with additional questions, if there is no response within 30 minutes please call the above phone number  Palliative Medicine Team providers are available by phone from 7am to 7pm daily and can be reached through the team cell phone.  Should this patient require  assistance outside of these hours, please call the patient's attending physician.

## 2023-10-08 NOTE — Progress Notes (Signed)
 PROGRESS NOTE    [name] [name]  OZH:[id number] DOB: 04-17-1934 DOA: 10/06/2023 PCP: Lawrance Presume, MD  Outpatient Specialists:     Brief Narrative:  Patient is an 89 year old male, skilled nursing facility resident, past medical history significant for PEG tube placement, hypertension, hypothyroidism, gout and BPH.  Patient was admitted with sepsis/aspiration pneumonia.  Patient is currently on IV vancomycin and cefepime.  Blood culture may be growing Staphylococcus species.  Will continue current antibiotics for now.  10/07/2023: Patient seen alongside patient's nurse.  Also updated patient's son, [name] (phone #[phone] 09/28/1958).  No fever overnight.  Mild improvement in leukocytosis.  Patient's wife is stronger today.  Patient is producing thick yellowish phlegm.  Speech therapy is input is appreciated.  Patient has dysphagia, with likely poor prognosis.  Apparently, patient lives at home with family.  10/08/2023: Patient seen.  Patient continues to improve.  Likely discharge tomorrow on antibiotics via PEG tube.  Assessment & Plan:   Principal Problem:   Aspiration pneumonia (HCC) Active Problems:   Sepsis (HCC)   Sepsis/aspiration pneumonia: -Patient has PEG tube in place. -Prior to presentation, tube feeds were noted to be in patient's oral cavity. -Chest x-ray all suggestive of left lung pneumonia, and CT chest suggestive of multifocal pneumonia. -Aspiration precautions. -Continue IV vancomycin and cefepime. -Sepsis physiology has resolved. -Negative cultures to date. -Likely DC on antibiotics via PEG tube tomorrow. -Speech therapist input is appreciated.     Cachexia/malnutrition (moderate to severe): -Status post PEG tube placement. -Continue tube feeds. -Continue to monitor for residuals.   -Podiatry team input is highly appreciated.    -Albumin of 2.2 -BMI of 17.82.   Anemia: -Chronic. -Likely multifactorial. -Patient is iron deficient. -Patient is vitamin  B12 deficient.  Last level checked on 09/21/2023 was 143.  Continue supplemental B12. -Folate of 10.7.      Hyponatremia: -Sodium of 135 today. -Continue to monitor closely.  Hypothyroidism: -Increase Synthroid to 100 mcg daily, via PEG tube.  DVT prophylaxis: Subcutaneous Arixtra. Code Status: Full code. Family Communication: Son, Dejon Lukas Disposition Plan: Patient remains very ill.   Consultants:  None.  Procedures:  None.  Antimicrobials:  IV vancomycin. IV cefepime.   Subjective: -No significant history.   -Patient continues to improve.    Objective: Vitals:   10/07/23 2235 10/08/23 0450 10/08/23 0740 10/08/23 1059  BP: 127/85 (!) 166/84 136/66 (!) 156/81  Pulse: 87 90    Resp: 19 (!) 29    Temp: 98 F (36.7 C) 98.4 F (36.9 C) 98 F (36.7 C) 98 F (36.7 C)  TempSrc: Oral Oral Axillary Axillary  SpO2: 96% 96%  94%  Weight:  59.6 kg    Height:        Intake/Output Summary (Last 24 hours) at 10/08/2023 1219 Last data filed at 10/07/2023 2242 Gross per 24 hour  Intake 148.5 ml  Output 600 ml  Net -451.5 ml   Filed Weights   10/06/23 1549 10/07/23 0532 10/08/23 0450  Weight: 59.5 kg 63.2 kg 59.6 kg    Examination:  General exam: Not in any distress.  Awake and alert.       Respiratory system: Decreased air entry.   Cardiovascular system: S1 & S2 heard Gastrointestinal system: Abdomen is soft and nontender. Central nervous system: Awake and alert.   Extremities: Mild bilateral ankle edema.  Data Reviewed: I have personally reviewed following labs and imaging studies  CBC: Recent Labs  Lab 10/06/23 0918 10/07/23 0214 10/08/23 0201  WBC 14.4* 12.2* 7.7  NEUTROABS 12.7*  --  6.2  HGB 8.0* 7.3* 7.5*  HCT 25.8* 23.2* 24.0*  MCV 86.3 83.2 83.6  PLT 338 270 263   Basic Metabolic Panel: Recent Labs  Lab 10/06/23 0918 10/06/23 1906 10/07/23 0214 10/08/23 0201  NA 134*  --  135 135  K 4.8  --  4.6 4.4  CL 101  --  105 106  CO2 20*  --   21* 20*  GLUCOSE 91  --  85 125*  BUN 37*  --  31* 34*  CREATININE 1.18  --  1.14 1.10  CALCIUM 9.2  --  9.0 9.2  MG  --  1.9  --  2.0  PHOS  --  2.8  --  2.3*   GFR: Estimated Creatinine Clearance: 39.1 mL/min (by C-G formula based on SCr of 1.1 mg/dL). Liver Function Tests: Recent Labs  Lab 10/06/23 0918 10/08/23 0201  AST 32  --   ALT 26  --   ALKPHOS 68  --   BILITOT 0.4  --   PROT 7.3  --   ALBUMIN 2.2* 1.8*   No results for input(s): "LIPASE", "AMYLASE" in the last 168 hours. No results for input(s): "AMMONIA" in the last 168 hours. Coagulation Profile: Recent Labs  Lab 10/06/23 0918  INR 1.2   Cardiac Enzymes: No results for input(s): "CKTOTAL", "CKMB", "CKMBINDEX", "TROPONINI" in the last 168 hours. BNP (last 3 results) No results for input(s): "PROBNP" in the last 8760 hours. HbA1C: No results for input(s): "HGBA1C" in the last 72 hours. CBG: Recent Labs  Lab 10/07/23 1125 10/07/23 1615 10/07/23 2119 10/08/23 0609 10/08/23 1108  GLUCAP 81 94 105* 110* 100*   Lipid Profile: No results for input(s): "CHOL", "HDL", "LDLCALC", "TRIG", "CHOLHDL", "LDLDIRECT" in the last 72 hours. Thyroid Function Tests: Recent Labs    10/06/23 0918  TSH 54.339*   Anemia Panel: No results for input(s): "VITAMINB12", "FOLATE", "FERRITIN", "TIBC", "IRON", "RETICCTPCT" in the last 72 hours. Urine analysis:    Component Value Date/Time   COLORURINE YELLOW 10/06/2023 0947   APPEARANCEUR HAZY (A) 10/06/2023 0947   LABSPEC 1.012 10/06/2023 0947   PHURINE 5.0 10/06/2023 0947   GLUCOSEU NEGATIVE 10/06/2023 0947   HGBUR NEGATIVE 10/06/2023 0947   BILIRUBINUR NEGATIVE 10/06/2023 0947   KETONESUR NEGATIVE 10/06/2023 0947   PROTEINUR NEGATIVE 10/06/2023 0947   UROBILINOGEN 0.2 12/03/2021 1921   NITRITE NEGATIVE 10/06/2023 0947   LEUKOCYTESUR NEGATIVE 10/06/2023 0947   Sepsis Labs: @LABRCNTIP (procalcitonin:4,lacticidven:4)  ) Recent Results (from the past 240 hours)   Resp panel by RT-PCR (RSV, Flu A&B, Covid) Anterior Nasal Swab     Status: None   Collection Time: 10/06/23  9:09 AM   Specimen: Anterior Nasal Swab  Result Value Ref Range Status   SARS Coronavirus 2 by RT PCR NEGATIVE NEGATIVE Final   Influenza A by PCR NEGATIVE NEGATIVE Final   Influenza B by PCR NEGATIVE NEGATIVE Final    Comment: (NOTE) The Xpert Xpress SARS-CoV-2/FLU/RSV plus assay is intended as an aid in the diagnosis of influenza from Nasopharyngeal swab specimens and should not be used as a sole basis for treatment. Nasal washings and aspirates are unacceptable for Xpert Xpress SARS-CoV-2/FLU/RSV testing.  Fact Sheet for Patients: BloggerCourse.com  Fact Sheet for Healthcare Providers: SeriousBroker.it  This test is not yet approved or cleared by the [place]  FDA and has been authorized for detection and/or diagnosis of SARS-CoV-2 by FDA under an Emergency Use Authorization (EUA). This  EUA will remain in effect (meaning this test can be used) for the duration of the COVID-19 declaration under Section 564(b)(1) of the Act, 21 U.S.C. section 360bbb-3(b)(1), unless the authorization is terminated or revoked.     Resp Syncytial Virus by PCR NEGATIVE NEGATIVE Final    Comment: (NOTE) Fact Sheet for Patients: BloggerCourse.com  Fact Sheet for Healthcare Providers: SeriousBroker.it  This test is not yet approved or cleared by the [place]  FDA and has been authorized for detection and/or diagnosis of SARS-CoV-2 by FDA under an Emergency Use Authorization (EUA). This EUA will remain in effect (meaning this test can be used) for the duration of the COVID-19 declaration under Section 564(b)(1) of the Act, 21 U.S.C. section 360bbb-3(b)(1), unless the authorization is terminated or revoked.  Performed at [facility], [address] [place],  [place] [address]   Blood Culture (routine x 2)     Status: None (Preliminary result)   Collection Time: 10/06/23  9:14 AM   Specimen: BLOOD  Result Value Ref Range Status   Specimen Description BLOOD SITE NOT SPECIFIED  Final   Special Requests   Final    BOTTLES DRAWN AEROBIC AND ANAEROBIC Blood Culture results may not be optimal due to an inadequate volume of blood received in culture bottles   Culture  Setup Time   Final    GRAM POSITIVE COCCI AEROBIC BOTTLE ONLY CRITICAL RESULT CALLED TO, READ BACK BY AND VERIFIED WITH: PHARMD MICHAEL BITONTI [id number] AT 1001 BY EC    Culture   Final    GRAM POSITIVE COCCI IDENTIFICATION TO FOLLOW Performed at [facility], [address], [place], [place] [address]    Report Status PENDING  Incomplete  Blood Culture ID Panel (Reflexed)     Status: Abnormal   Collection Time: 10/06/23  9:14 AM  Result Value Ref Range Status   Enterococcus faecalis NOT DETECTED NOT DETECTED Final   Enterococcus Faecium NOT DETECTED NOT DETECTED Final   Listeria monocytogenes NOT DETECTED NOT DETECTED Final   Staphylococcus species DETECTED (A) NOT DETECTED Final    Comment: CRITICAL RESULT CALLED TO, READ BACK BY AND VERIFIED WITH: PHARMD MICAHEL BITONTI [id number] AT 1001 BY EC    Staphylococcus aureus (BCID) NOT DETECTED NOT DETECTED Final   Staphylococcus epidermidis NOT DETECTED NOT DETECTED Final   Staphylococcus lugdunensis NOT DETECTED NOT DETECTED Final   Streptococcus species NOT DETECTED NOT DETECTED Final   Streptococcus agalactiae NOT DETECTED NOT DETECTED Final   Streptococcus pneumoniae NOT DETECTED NOT DETECTED Final   Streptococcus pyogenes NOT DETECTED NOT DETECTED Final   A.calcoaceticus-baumannii NOT DETECTED NOT DETECTED Final   Bacteroides fragilis NOT DETECTED NOT DETECTED Final   Enterobacterales NOT DETECTED NOT DETECTED Final   Enterobacter cloacae complex NOT DETECTED NOT DETECTED Final   Escherichia coli NOT DETECTED NOT DETECTED  Final   Klebsiella aerogenes NOT DETECTED NOT DETECTED Final   Klebsiella oxytoca NOT DETECTED NOT DETECTED Final   Klebsiella pneumoniae NOT DETECTED NOT DETECTED Final   Proteus species NOT DETECTED NOT DETECTED Final   Salmonella species NOT DETECTED NOT DETECTED Final   Serratia marcescens NOT DETECTED NOT DETECTED Final   Haemophilus influenzae NOT DETECTED NOT DETECTED Final   Neisseria meningitidis NOT DETECTED NOT DETECTED Final   Pseudomonas aeruginosa NOT DETECTED NOT DETECTED Final   Stenotrophomonas maltophilia NOT DETECTED NOT DETECTED Final   Candida albicans NOT DETECTED NOT DETECTED Final   Candida auris NOT DETECTED NOT DETECTED Final  Candida glabrata NOT DETECTED NOT DETECTED Final   Candida krusei NOT DETECTED NOT DETECTED Final   Candida parapsilosis NOT DETECTED NOT DETECTED Final   Candida tropicalis NOT DETECTED NOT DETECTED Final   Cryptococcus neoformans/gattii NOT DETECTED NOT DETECTED Final    Comment: Performed at [facility], [address] [place], [place] [address]  Blood Culture (routine x 2)     Status: None (Preliminary result)   Collection Time: 10/06/23  9:20 AM   Specimen: BLOOD  Result Value Ref Range Status   Specimen Description BLOOD SITE NOT SPECIFIED  Final   Special Requests   Final    BOTTLES DRAWN AEROBIC AND ANAEROBIC Blood Culture adequate volume   Culture   Final    NO GROWTH 2 DAYS Performed at [facility], [address] [place], [place] [address]    Report Status PENDING  Incomplete  Expectorated Sputum Assessment w Gram Stain, Rflx to Resp Cult     Status: None   Collection Time: 10/06/23  6:19 PM   Specimen: Expectorated Sputum  Result Value Ref Range Status   Specimen Description EXPECTORATED SPUTUM  Final   Special Requests NONE  Final   Sputum evaluation   Final    THIS SPECIMEN IS ACCEPTABLE FOR SPUTUM CULTURE Performed at [facility], [address], [place], [place] [address]    Report Status  10/07/2023 FINAL  Final  Culture, Respiratory w Gram Stain     Status: None (Preliminary result)   Collection Time: 10/06/23  6:19 PM  Result Value Ref Range Status   Specimen Description EXPECTORATED SPUTUM  Final   Special Requests NONE Reflexed from [id number]  Final   Gram Stain   Final    ABUNDANT WBC PRESENT, PREDOMINANTLY PMN RARE SQUAMOUS EPITHELIAL CELLS PRESENT ABUNDANT GRAM POSITIVE COCCI FEW GRAM NEGATIVE RODS FEW GRAM POSITIVE RODS Performed at [facility], [address], [place], [place] [address]    Culture PENDING  Incomplete   Report Status PENDING  Incomplete         Radiology Studies: No results found.       Scheduled Meds:  cyanocobalamin  1,000 mcg Per Tube Daily   ferrous sulfate  300 mg Per Tube QODAY   fondaparinux (ARIXTRA) injection  2.5 mg Subcutaneous Q24H   free water  300 mL Per Tube Q6H   levothyroxine  100 mcg Per Tube Q0600   multivitamin with minerals  1 tablet Per Tube Daily   mouth rinse  15 mL Mouth Rinse 4 times per day   pantoprazole (PROTONIX) IV  40 mg Intravenous Q24H   Continuous Infusions:  ceFEPime (MAXIPIME) IV 2 g (10/08/23 0933)   feeding supplement (JEVITY 1.5 CAL/FIBER) 1,000 mL (10/07/23 2224)   vancomycin 750 mg (10/08/23 1055)     LOS: 2 days    Time spent: 35 minutes.    Fonnie Iba, MD  [facility] Pager #: [phone] 7PM-7AM contact night coverage as above

## 2023-10-08 NOTE — Consult Note (Deleted)
AS

## 2023-10-09 ENCOUNTER — Other Ambulatory Visit (HOSPITAL_COMMUNITY): Payer: Self-pay

## 2023-10-09 ENCOUNTER — Other Ambulatory Visit (HOSPITAL_BASED_OUTPATIENT_CLINIC_OR_DEPARTMENT_OTHER): Payer: Self-pay

## 2023-10-09 LAB — GLUCOSE, CAPILLARY
Glucose-Capillary: 107 mg/dL — ABNORMAL HIGH (ref 70–99)
Glucose-Capillary: 110 mg/dL — ABNORMAL HIGH (ref 70–99)
Glucose-Capillary: 119 mg/dL — ABNORMAL HIGH (ref 70–99)
Glucose-Capillary: 95 mg/dL (ref 70–99)

## 2023-10-09 LAB — RENAL FUNCTION PANEL
Albumin: 1.7 g/dL — ABNORMAL LOW (ref 3.5–5.0)
Anion gap: 9 (ref 5–15)
BUN: 35 mg/dL — ABNORMAL HIGH (ref 8–23)
CO2: 23 mmol/L (ref 22–32)
Calcium: 8.9 mg/dL (ref 8.9–10.3)
Chloride: 101 mmol/L (ref 98–111)
Creatinine, Ser: 0.96 mg/dL (ref 0.61–1.24)
GFR, Estimated: >60 mL/min (ref >60)
Glucose, Bld: 122 mg/dL — ABNORMAL HIGH (ref 70–99)
Phosphorus: 2.6 mg/dL (ref 2.5–4.6)
Potassium: 4.5 mmol/L (ref 3.5–5.1)
Sodium: 133 mmol/L — ABNORMAL LOW (ref 135–145)

## 2023-10-09 LAB — CULTURE, BLOOD (ROUTINE X 2)

## 2023-10-09 LAB — MAGNESIUM: Magnesium: 1.8 mg/dL (ref 1.7–2.4)

## 2023-10-09 MED ORDER — AMLODIPINE BESYLATE 2.5 MG PO TABS
2.5000 mg | ORAL_TABLET | Freq: Every day | ORAL | 1 refills | Status: DC
  Filled 2023-10-09: qty 30, 30d supply, fill #0

## 2023-10-09 MED ORDER — ORAL CARE MOUTH RINSE
15.0000 mL | OROMUCOSAL | 0 refills | Status: DC | PRN
  Filled 2023-10-09: qty 88.7, fill #0

## 2023-10-09 MED ORDER — LIDOCAINE 5 % EX PTCH
1.0000 | MEDICATED_PATCH | CUTANEOUS | 0 refills | Status: DC
  Filled 2023-10-09: qty 30, 30d supply, fill #0

## 2023-10-09 MED ORDER — LINEZOLID 600 MG PO TABS
600.0000 mg | ORAL_TABLET | Freq: Two times a day (BID) | ORAL | 0 refills | Status: AC
  Filled 2023-10-09: qty 10, 5d supply, fill #0

## 2023-10-09 MED ORDER — LEVOFLOXACIN 500 MG PO TABS
500.0000 mg | ORAL_TABLET | Freq: Every day | ORAL | 0 refills | Status: AC
  Filled 2023-10-09: qty 5, 5d supply, fill #0
  Filled 2023-10-09: qty 7, 7d supply, fill #0

## 2023-10-09 MED ORDER — PANTOPRAZOLE SODIUM 40 MG PO PACK
40.0000 mg | PACK | Freq: Every day | ORAL | 0 refills | Status: DC
  Filled 2023-10-09: qty 30, 30d supply, fill #0

## 2023-10-09 MED ORDER — ORAL CARE MOUTH RINSE
15.0000 mL | Freq: Four times a day (QID) | OROMUCOSAL | Status: AC
Start: 2023-10-09 — End: ?

## 2023-10-09 NOTE — Progress Notes (Signed)
 Went over AVS paper work with patients' sons wife over the phone, removed IV and primofit, disconnected continuous tube feedings and flushed line with 30 mls, gathered patient belongings, gave PTAR the AVS paperwork and TOC meds and PTAR rolled patient out.

## 2023-10-09 NOTE — Progress Notes (Signed)
  MATCH MEDICATION ASSISTANCE CARD Pharmacies please call [phone] for claim processing assistance.  Rx BIN: [id number] Rx Group: [id number] Rx PCN: [id number] Relationship Code: 1 Person Code: [id number]  Patient ID (MRN): [id number]    Patient Name: [name] [name]   Patient DOB [birth year]   Discharge Date:10/09/22  Expiration Date:10/16/2023 (must be filled within 7 days of discharge)   Dear [name] [name] have been approved to have the prescriptions written by your discharging physician filled through our [name] ([name]) program. This program allows for a one-time (no refills) 34-day supply of selected medications for a low copay amount.  The copay is $3.00 per prescription. For instance, if you have one prescription, you will pay $3.00; for two prescriptions, you pay $6.00; for three prescriptions, you pay $9.00; and so on. Only certain pharmacies are participating in this program with [name]. You will need to select one of the pharmacies from the attached lists and take your prescriptions, this letter, and your photo ID to one of the participating pharmacies.  We are excited that you are able to use the [name] program to get your medications. These prescriptions must be filled within 7 days of hospital discharge or they will no longer be valid for the [name] program. Should you have any problems with your prescriptions please contact your case management team member at [phone] for [name] or [phone] for [name].  Thank you, [name]    [name] Pharmacies [name], [name], [name]  [name] Pharmacies [address] [place], [place] [address] [place], [place] [address] [place], [employer] [address], [address], [place] [name] [address] [place], [name] [address]  [name] [id number], [place]

## 2023-10-09 NOTE — TOC Transition Note (Signed)
 Transition of Care [facility]) - Discharge Note   Patient Details  Name: [name] [name] MRN: [id number] Date of Birth: [birth year]  Transition of Care [facility]) CM/SW Contact:  Jeani Mill, RN Phone Number: 10/09/2023, 2:41 PM   Clinical Narrative:    Patient stable for discharge. Patient lives with son and is followed by [facility].  Patient has a year supply of tube feeds.  MATCH letter done.  Will call PTAR when patient is ready. Spoke to son who is home ready for patient. Address, Phone number and PCP verified.    Final next level of care: Home/Self Care Barriers to Discharge: Barriers Resolved   Patient Goals and CMS Choice            Discharge Placement                 Home      Discharge Plan and Services Additional resources added to the After Visit Summary for                                       Social Drivers of Health (SDOH) Interventions SDOH Screenings   Food Insecurity: No Food Insecurity (10/06/2023)  Housing: Low Risk  (10/06/2023)  Transportation Needs: No Transportation Needs (10/06/2023)  Utilities: Not At Risk (10/06/2023)  Depression (PHQ2-9): Medium Risk (03/10/2022)  Social Connections: Moderately Integrated (10/06/2023)  Tobacco Use: Low Risk  (10/06/2023)     Readmission Risk Interventions     No data to display

## 2023-10-09 NOTE — Progress Notes (Signed)
 Nutrition Follow-up  DOCUMENTATION CODES:   Not applicable  INTERVENTION:   Resume home TF regimen on discharge: 6 cartons Jevity 1.5 daily (1.5 cartons at 8AM, 12PM, 4PM, 8PM) Free water q6h per MD at 9AM, 1PM,  5PM, and 9PM ( daily)   Provides 2130 kcal, 91g protein and free water daily (TF + FWF)   Alternative TF plan if unable to obtain Jevity 1.5: 6 cartons daily of Ensure Plus Free water q6h per MD ( daily)   Provides 2100 kcal, 78g protein  Continue Reglan as prokinetic agent.   If in line with goals of care, consider converting G tube into a G-J tube which may help decrease vomiting and aspiration risk.   Continue MVI w/ minerals  Recommend providing mittens on discharge to prevent patient from pulling out PEG tube  NUTRITION DIAGNOSIS:  Inadequate oral intake related to inability to eat as evidenced by NPO status. - remains applicable  GOAL:  Patient will meet greater than or equal to 90% of their needs - being met via TF regimen at goal rate  MONITOR:   TF tolerance  REASON FOR ASSESSMENT:  Consult Enteral/tube feeding initiation and management, Assessment of nutrition requirement/status  ASSESSMENT:  89 yo male admitted with sepsis, aspiration PNA. PMH includes HTN, hypothyroidism, BPH, gout, eczema, dysphagia, PEG, no pork products d/t religious preference.  4/12 admitted w/ sepsis/aspiration PNA 4/14 transition from continuous feeds to home regimen   Pt tolerating continuous feed. RN endorses no N/V overnight. Spoke with patient's son via telephone. Endorses patient was not tolerating home regimen of 1 carton - 6x daily as they would feed and it would come right back out his mouth. Upon further discussion, it was reported that family is administering feedings with patient laying down. Educated son on importance of keeping patient in upright position during and after feedings and fluid boluses.Pt son also requesting mittens  for patient on discharge, as he has already pulled out his PEG tube once s/p discharge from last hospitalization.   Spoke to MD who is amicable to patient returning to home regimen on discharge. He is solely reliant on tube feeding as his means of nutrition. Family was receiving financial assistance with TF formula up until October 2024.   Admit Weight: 59.5kg Current Weight: 59.6kg  No edema on exam. Wt stable since admission. Current home TF regimen appears to be meeting patient needs sufficiently, as he has shown consistent weight trend upward since October 2024 visit. This is desirable. No significant skin breakdown noted, which is also an indicator of regimen meeting basal metabolic needs.   Drains/Lines: LUQ: G-tube (16Fr) Urinary catheter UOP: x24 hours  Levothyroxine increased this admission. B12 supplementation continues.  Meds: cyanocobalamin, ferrous sulfate, MVI, pantoprazole, levothyroxine  Drips: IV ABX  Labs:  Na+ 134>135>135>133 (L) K+ 4.5 (wdl) Mg 2.0>1.8 (wdl) PHOS 2.8>2.3>2.6 (wdl) CBGs 122-125 x24 hours    NUTRITION - FOCUSED PHYSICAL EXAM:  Flowsheet Row Most Recent Value  Orbital Region Severe depletion  Upper Arm Region Severe depletion  Thoracic and Lumbar Region Severe depletion  Buccal Region Severe depletion  Temple Region Severe depletion  Clavicle Bone Region Severe depletion  Clavicle and Acromion Bone Region Severe depletion  Scapular Bone Region Severe depletion  Dorsal Hand Severe depletion  Patellar Region Severe depletion  Anterior Thigh Region Severe depletion  Posterior Calf Region Severe depletion  Edema (RD Assessment) None  Hair Reviewed  Eyes Reviewed  Mouth Reviewed  Skin Reviewed  Nails Reviewed    Diet Order:   Diet Order             Diet NPO time specified  Diet effective now             EDUCATION NEEDS:   No education needs have been identified at this time  Skin:  Skin Assessment: Skin Integrity  Issues: Skin Integrity Issues:: DTI DTI: Left thigh  Last BM:  no BM documented this admission  Height:  Ht Readings from Last 1 Encounters:  10/06/23 6' (1.829 m)   Weight:  Wt Readings from Last 1 Encounters:  10/08/23 59.6 kg   Ideal Body Weight:  80.9 kg  BMI:  Body mass index is 17.82 kg/m.  Estimated Nutritional Needs:   Kcal:  1800-2000  Protein:  90-110 gm  Fluid:  1.8-2 L  Con Decant MS, RD, LDN Registered Dietitian Clinical Nutrition RD Inpatient Contact Info in Amion

## 2023-10-09 NOTE — Discharge Summary (Signed)
 Physician Discharge Summary  Patient ID: [name] [name] MRN: [id number] DOB/AGE: [birth year] 89 y.o.  Admit date: 10/06/2023 Discharge date: 10/09/2023  Admission Diagnoses:  Discharge Diagnoses:  Principal Problem:   Aspiration pneumonia (HCC) Active Problems:   Sepsis [facility])   Discharged Condition: stable  Hospital Course: Patient is an 89 year old male, skilled nursing facility resident, past medical history significant for PEG tube placement, hypertension, hypothyroidism, gout and BPH.  Patient was admitted with sepsis/aspiration pneumonia and treated with IV vancomycin  and cefepime .  Patient has improved significantly.  Patient will be discharged on 5-day course of Zyvox  and Levaquin .   Sepsis/aspiration pneumonia: -Patient has PEG tube in place. -Prior to presentation, tube feeds were noted to be in patient's oral cavity. -Chest x-ray was suggestive of left lung pneumonia, and CT chest suggestive of multifocal pneumonia. - Patient was treated with IV vancomycin  and cefepime  during the hospital stay. - Patient will be discharged on 5-day course of Zyvox  and Levaquin . - Sepsis physiology has resolved. - Patient is back to baseline. - Patient will follow-up with primary care provider on discharge. - Prognosis is guarded.   -Speech therapist input is appreciated.     Cachexia/malnutrition (moderate to severe): -Status post PEG tube placement. - Tube feeds were continued during the hospital stay.   -Continued to monitor for residuals.    -Albumin  of 2.2 -BMI of 17.82.   Anemia: -Chronic. -Likely multifactorial. -Patient is iron  deficient. -Patient is vitamin B12 deficient.  Last level checked on 09/21/2023 was 143.  Continue supplemental B12. -Folate of 10.7.      Hyponatremia: -Sodium of 135. -Continue to monitor closely.   Hypothyroidism: -Increase Synthroid  to 100 mcg daily, via PEG tube.    Consults: None  Significant Diagnostic Studies:  CT scan of the  chest/abdomen/pelvis revealed multilobar pneumonia.   Treatments:   Discharge Exam: Blood pressure 127/62, pulse 75, temperature 98.2 F (36.8 C), temperature source Axillary, resp. rate 20, height 6' (1.829 m), weight 59.6 kg, SpO2 99%.   Disposition: Discharge disposition: 01-Home or Self Care       Discharge Instructions     Diet - low sodium heart healthy   Complete by: As directed    Discharge wound care:   Complete by: As directed    Continue current wound care plan.   Increase activity slowly   Complete by: As directed       Allergies as of 10/09/2023       Reactions   Pork-derived Products Other (See Comments)   Religious Reasons        Medication List     TAKE these medications    amLODipine  2.5 MG tablet Commonly known as: NORVASC  Take 1 tablet (2.5 mg total) by mouth daily. What changed:  medication strength how much to take how to take this   cyanocobalamin  1000 MCG tablet Commonly known as: VITAMIN B12 Crush 1 tablet (1,000 mcg total) and dissolve in a small amount of water  to give by feeding tube daily.   feeding supplement (JEVITY 1.5 CAL/FIBER) Liqd Place 1,440 mLs into feeding tube continuous.   FeroSul 325 (65 Fe) MG tablet Generic drug: ferrous sulfate  Take 1 tablet (325 mg) by mouth every other day. Please make an appointment with Dr. Lincoln Renshaw for more refills. What changed: how to take this   free water  Soln Place 300 mLs into feeding tube every 6 (six) hours.   levofloxacin  500 MG tablet Commonly known as: LEVAQUIN  Take 1 tablet (500 mg total) by mouth daily  for 5 days.   levothyroxine  75 MCG tablet Commonly known as: SYNTHROID  Take 1 tablet (75 mcg total) by mouth daily before breakfast.   lidocaine  5 % Commonly known as: LIDODERM  Place 1 patch onto the skin daily. Remove & Discard patch within 12 hours or as directed by MD   linezolid  100 MG/5ML suspension Commonly known as: ZYVOX  Take 30 mLs (600 mg total) by mouth  2 (two) times daily for 5 days.   mouth rinse Liqd solution 15 mLs by Mouth Rinse route 4 (four) times daily.   mouth rinse Liqd solution 15 mLs by Mouth Rinse route as needed (oral care).   multivitamin with minerals Tabs tablet Take 1 tablet by mouth daily.   pantoprazole  sodium 40 mg Commonly known as: PROTONIX  Place 40 mg into feeding tube daily.               Discharge Care Instructions  (From admission, onward)           Start     Ordered   10/09/23 0000  Discharge wound care:       Comments: Continue current wound care plan.   10/09/23 1343            Time spent: 35 minutes  Signed: Doroteo Gasmen 10/09/2023, 1:44 PM

## 2023-10-10 ENCOUNTER — Telehealth: Payer: Self-pay | Admitting: *Deleted

## 2023-10-10 LAB — CULTURE, RESPIRATORY W GRAM STAIN

## 2023-10-10 NOTE — Transitions of Care (Post Inpatient/ED Visit) (Signed)
   10/10/2023  Name: [name] [name] MRN: [id number] DOB: 05/14/1934  Today's TOC FU Call Status: Today's TOC FU Call Status:: Unsuccessful Call (1st Attempt) Unsuccessful Call (1st Attempt) Date: 10/10/23  Attempted to reach the patient regarding the most recent Inpatient/ED visit.  Follow Up Plan: Additional outreach attempts will be made to reach the patient to complete the Transitions of Care (Post Inpatient/ED visit) call.   Arna Better RN, BSN [place]  [facility] Institute [facility] RN Care Manager [phone]

## 2023-10-11 ENCOUNTER — Telehealth: Payer: Self-pay | Admitting: *Deleted

## 2023-10-11 LAB — CULTURE, BLOOD (ROUTINE X 2)
Culture: NO GROWTH
Special Requests: ADEQUATE

## 2023-10-11 NOTE — Transitions of Care (Post Inpatient/ED Visit) (Signed)
 10/11/2023  Name: [name] [name] MRN: [id number] DOB: 1934/06/13  Today's TOC FU Call Status: Today's TOC FU Call Status:: Successful TOC FU Call Completed TOC FU Call Complete Date: 10/11/23 Patient's Name and Date of Birth confirmed.  Transition Care Management Follow-up Telephone Call Date of Discharge: 10/09/23 Discharge Facility: [facility]) Type of Discharge: Inpatient Admission Primary Inpatient Discharge Diagnosis:: Aspiration Pneumonia How have you been since you were released from the hospital?: Better Any questions or concerns?: No  Items Reviewed: Did you receive and understand the discharge instructions provided?: Yes Medications obtained,verified, and reconciled?: Yes (Medications Reviewed) Any new allergies since your discharge?: No Dietary orders reviewed?: Yes Type of Diet Ordered:: low sodium heart healthy-NPO. Patient receiving tube feedings 6 times a day Do you have support at home?: Yes People in Home [RPT]: child(ren), adult Name of Support/Comfort Primary Source: [name] and daughter in law provide all care to patient  Medications Reviewed Today: Medications Reviewed Today     Reviewed by Heidi Dach, RN (Registered Nurse) on 10/11/23 at 1409  Med List Status: <None>   Medication Order Taking? Sig Documenting Provider Last Dose Status Informant  amLODipine (NORVASC) 2.5 MG tablet [id number] Yes Take 1 tablet (2.5 mg total) by mouth daily. Barnetta Chapel, MD Taking Active   cyanocobalamin 1000 MCG tablet [id number] Yes Crush 1 tablet (1,000 mcg total) and dissolve in a small amount of water to give by feeding tube daily. Danford, Earl Lites, MD Taking Active Child, Pharmacy Records  Iron, Ferrous Sulfate, 325 (65 Fe) MG TABS [id number] Yes Take 1 tablet (325 mg) by mouth every other day. Please make an appointment with Dr. Laural Benes for more refills.  Patient taking differently: Place 325 mg into feeding tube every other day.   Marcine Matar, MD Taking Active Child, Pharmacy Records           Med Note [facility], Corinna Gab Sep 20, 2023  9:55 PM)    levofloxacin (LEVAQUIN) 500 MG tablet [id number] Yes Take 1 tablet (500 mg total) by mouth daily for 5 days. Barnetta Chapel, MD Taking Active   levothyroxine (SYNTHROID) 75 MCG tablet [id number] Yes Take 1 tablet (75 mcg total) by mouth daily before breakfast. Marcine Matar, MD Taking Active Child, Pharmacy Records           Med Note [name], Corinna Gab Sep 20, 2023  9:54 PM)    lidocaine (LIDODERM) 5 % [id number] Yes Place 1 patch onto the skin daily. Remove & Discard patch within 12 hours or as directed by MD Barnetta Chapel, MD Taking Active   linezolid (ZYVOX) 600 MG tablet [id number] Yes Crush 1 tablet (600 mg total)  and place into tube 2 (two) times daily for 5 days. Barnetta Chapel, MD Taking Active   Mouthwashes (MOUTH RINSE) LIQD solution [id number] Yes 15 mLs by Mouth Rinse route 4 (four) times daily. Barnetta Chapel, MD Taking Active   Mouthwashes (MOUTH RINSE) LIQD solution [id number] Yes 15 mLs by Mouth Rinse route as needed (oral care). Barnetta Chapel, MD Taking Active   Multiple Vitamin (MULTIVITAMIN WITH MINERALS) TABS tablet [id number] No Take 1 tablet by mouth daily.  Patient not taking: Reported on 10/11/2023   Alberteen Sam, MD Not Taking Active Child, Pharmacy Records  Nutritional Supplements (FEEDING SUPPLEMENT, JEVITY 1.5 CAL/FIBER,) LIQD [id number] Yes Place 1,440 mLs into feeding tube continuous. Danford, Earl Lites, MD Taking Active Child, Pharmacy Records  Discontinued 10/09/23 1423 (Discontinued by provider)            Med Note>> Veldon German, [name]   10/09/2023  2:23 PM d/c per MD    Water For Irrigation, Sterile (FREE WATER) SOLN [id number] Yes Place 300 mLs into feeding tube every 6 (six) hours. Ephriam Hashimoto, MD Taking Active Child, Pharmacy Records            Home Care and Equipment/Supplies: Were Home  Health Services Ordered?: No Any new equipment or medical supplies ordered?: No  Functional Questionnaire: Do you need assistance with bathing/showering or dressing?: Yes (Daughter in law assist with bathing) Do you need assistance with meal preparation?: Yes (daughter in law) Do you need assistance with eating?: Yes (NPO-tube feedings only) Do you have difficulty maintaining continence: Yes Do you need assistance with getting out of bed/getting out of a chair/moving?: Yes Do you have difficulty managing or taking your medications?: Yes (daughter in law manages all medications)  Follow up appointments reviewed: PCP Follow-up appointment confirmed?: No (Family will call the office to schedule a virtual appointment) MD Provider Line [phone] Given: No Follow-up Provider: Daughter in law will have patient's son call to schedule a virtual visit with PCP for hospital follow up. Specialist Hospital Follow-up appointment confirmed?: NA Do you need transportation to your follow-up appointment?: No Do you understand care options if your condition(s) worsen?: Yes-patient verbalized understanding  SDOH Interventions Today    Flowsheet Row Most Recent Value  SDOH Interventions   Food Insecurity Interventions Intervention Not Indicated  Housing Interventions Intervention Not Indicated  Transportation Interventions Intervention Not Indicated  Utilities Interventions Intervention Not Indicated       Goals Addressed             This Visit's Progress    VBCI Transitions of Care (TOC) Care Plan       Problems:  Recent Hospitalization for treatment of  Aspiration Pneumonia Equipment/DME barrier family requesting a wheelchair and No Hospital Follow Up Provider appointment    Goal:  Over the next 30 days, the patient will not experience hospital readmission  Interventions:  Transitions of Care: Doctor Visits  - discussed the importance of doctor visits Contacted provider for  patient needs -family requesting a wheelchair to assist with transporting patient, Medicaid pending-message sent to Dr. Lincoln Renshaw EMR Verbal education on fall prevention Verbal education provided on NPO, ensuring family elevates patient head of bed during tube feedings Advised patient to schedule hospital follow up with PCP, discussed barriers to in person visit, advised to request virtual visit  Patient Self Care Activities:  Attend all scheduled provider appointments Call provider office for new concerns or questions  Participate in Transition of Care Program/Attend [facility] scheduled calls  Plan:  Telephone follow up appointment with care management team member scheduled for:  10/18/23 at 2:30pm         Arna Better RN, BSN [place]  Value-Based Care Institute [facility] RN Care Manager [phone]

## 2023-10-18 ENCOUNTER — Telehealth: Payer: Self-pay | Admitting: *Deleted

## 2023-10-18 ENCOUNTER — Other Ambulatory Visit: Payer: Self-pay | Admitting: *Deleted

## 2023-10-18 NOTE — Transitions of Care (Post Inpatient/ED Visit) (Signed)
   10/18/2023  Name: [name] [name] MRN: [id number] DOB: April 24, 1934   RNCM received message from Dr. Lincoln Renshaw regarding virtual visit.  Lawrance Presume, MD  Aura Leeds, RN I can do Virtual visit but they need to set up a Mychart account.  RNCM spoke with DPR/Souzan to notify her. Souzan has attempted to set up a MyChart account and is unable to because patient does not have a Orthoptist. RNCM advised Souzan to contact PCP office and make them aware. Souzan voiced understanding.  Arna Better RN, BSN [place]  Value-Based Care Institute [facility] RN Care Manager [phone]

## 2023-10-18 NOTE — Transitions of Care (Post Inpatient/ED Visit) (Signed)
 Transition of Care week 2  Visit Note  10/18/2023  Name: [name] [name] MRN: [id number]          DOB: 10/26/1933  Situation: Patient enrolled in [facility] 30-day program. Visit completed with DPR/Souzan by telephone.   Background:   Initial Transition Care Management Follow-up Telephone Call    Past Medical History:  Diagnosis Date   Back pain 10/2008   per MRI 11/06/2008 - severe spinal stenosis worse at L4-5, L3-4, with neural compression at those levels   BPH (benign prostatic hyperplasia)    Eczema    Gout    Hypertension    Hypothyroidism     Assessment: Patient Reported Symptoms: Cognitive Cognitive Status: Alert and oriented to person, place, and time, Normal speech and language skills      Neurological Neurological Review of Symptoms: No symptoms reported    HEENT HEENT Symptoms Reported: No symptoms reported      Cardiovascular Cardiovascular Symptoms Reported: No symptoms reported Patient's Recent BP reading at home: BP today 129/75 Cardiovascular Self-Management Outcome: 4 (good)  Respiratory Respiratory Symptoms Reported: No symptoms reported    Endocrine Patient reports the following symptoms related to hypoglycemia or hyperglycemia : No symptoms reported Is patient diabetic?: No    Gastrointestinal Gastrointestinal Symptoms Reported: No symptoms reported Gastrointestinal Conditions: Other Other Gastrointestinal Conditions: Dysphagia, tube feedings via PEG 6 times daily Gastrointestinal Management Strategies: Nutrition support Enteral Nutrition Time Frame: Bolus Enteral Nutrition Schedule: 6 times daily Gastrointestinal Self-Management Outcome: 4 (good) Nutrition Risk Screen (CP): Tube feeding or parenteral nutrition  Genitourinary Genitourinary Symptoms Reported: No symptoms reported    Integumentary Integumentary Symptoms Reported: No symptoms reported    Musculoskeletal Musculoskelatal Symptoms Reviewed: Weakness Additional Musculoskeletal Details: Patient  does not get out of bed. Musculoskeletal Conditions: Mobility limited Musculoskeletal Management Strategies: Routine screening, Coping strategies Musculoskeletal Self-Management Outcome: 2 (bad) Musculoskeletal Comment: Family would like a wheelchair for mobility. Medicaid pending. RNCM contacted [facility]-no availability, advised to contact [employer] [phone]. Provided patient's family with contact information Falls in the past year?: Yes Number of falls in past year: 2 or more Was there an injury with Fall?: Yes Fall Risk Category Calculator: 3 Patient Fall Risk Level: High Fall Risk Patient at Risk for Falls Due to: History of fall(s), Impaired mobility Fall risk Follow up: Falls evaluation completed, Education provided, Falls prevention discussed  Psychosocial       Quality of Family Relationships: helpful, involved, supportive   Vitals:   10/18/23 1434  BP: 129/75    Medications Reviewed Today     Reviewed by Aura Leeds, RN (Registered Nurse) on 10/18/23 at 1423  Med List Status: <None>   Medication Order Taking? Sig Documenting Provider Last Dose Status Informant  amLODipine  (NORVASC ) 2.5 MG tablet [id number] Yes Take 1 tablet (2.5 mg total) by mouth daily. Doroteo Gasmen, MD Taking Active   cyanocobalamin  1000 MCG tablet [id number] Yes Crush 1 tablet (1,000 mcg total) and dissolve in a small amount of water  to give by feeding tube daily. Danford, Willis Harter, MD Taking Active Child, Pharmacy Records  Iron , Ferrous Sulfate , 325 (65 Fe) MG TABS [id number] Yes Take 1 tablet (325 mg) by mouth every other day. Please make an appointment with Dr. Lincoln Renshaw for more refills.  Patient taking differently: Place 325 mg into feeding tube every other day.   Lawrance Presume, MD Taking Active Child, Pharmacy Records           Med Note (WHITE, Nieves Bars  Wed Sep 20, 2023  9:55 PM)    levothyroxine  (SYNTHROID ) 75 MCG tablet [id number] Yes Take 1 tablet (75 mcg  total) by mouth daily before breakfast. Lawrance Presume, MD Taking Active Child, Pharmacy Records           Med Note [facility], Vernona Goods Sep 20, 2023  9:54 PM)    lidocaine  (LIDODERM ) 5 % [id number] Yes Place 1 patch onto the skin daily. Remove & Discard patch within 12 hours or as directed by MD Doroteo Gasmen, MD Taking Active   Mouthwashes (MOUTH RINSE) LIQD solution [id number] Yes 15 mLs by Mouth Rinse route 4 (four) times daily. Doroteo Gasmen, MD Taking Active   Mouthwashes (MOUTH RINSE) LIQD solution [id number] Yes 15 mLs by Mouth Rinse route as needed (oral care). Doroteo Gasmen, MD Taking Active   Multiple Vitamin (MULTIVITAMIN WITH MINERALS) TABS tablet [id number] Yes Take 1 tablet by mouth daily. Danford, Willis Harter, MD Taking Active Child, Pharmacy Records  Nutritional Supplements (FEEDING SUPPLEMENT, JEVITY 1.5 CAL/FIBER,) LIQD [id number] Yes Place 1,440 mLs into feeding tube continuous. Ephriam Hashimoto, MD Taking Active Child, Pharmacy Records    Discontinued 10/09/23 1423 (Discontinued by provider)            Med Note>> Veldon German, [facility]   10/09/2023  2:23 PM d/c per MD    Water  For Irrigation, Sterile (FREE WATER ) SOLN [id number] Yes Place 300 mLs into feeding tube every 6 (six) hours. Danford, Willis Harter, MD Taking Active Child, Pharmacy Records            Recommendation:   none  Follow Up Plan:   Closing From:  Transitions of Care Program-Patient DPR declines further engagement  Arna Better RN, BSN [place]  [facility] RN Care Manager [phone]

## 2023-11-03 ENCOUNTER — Ambulatory Visit: Payer: Self-pay | Admitting: Internal Medicine

## 2023-11-08 ENCOUNTER — Other Ambulatory Visit (HOSPITAL_COMMUNITY): Payer: Self-pay

## 2023-11-16 ENCOUNTER — Telehealth: Payer: Self-pay

## 2023-11-16 NOTE — Telephone Encounter (Signed)
 I spoke to patient's son, [name] [name], to inquire if has completed at [facility] application for his father.  He said his father is " doing great" and he is waiting for some information about a [employer] Card so he can submit a Medicaid application.  In the meantime he would like to speak to our financial counselor about CAFA and OC.  I told him that I would ask Josselin Speas, Financial Counselor to contact him

## 2023-11-20 ENCOUNTER — Inpatient Hospital Stay (HOSPITAL_COMMUNITY): Payer: MEDICAID

## 2023-11-20 ENCOUNTER — Inpatient Hospital Stay (HOSPITAL_COMMUNITY)
Admission: EM | Admit: 2023-11-20 | Discharge: 2023-12-26 | DRG: 870 | Disposition: E | Payer: MEDICAID | Attending: Pulmonary Disease | Admitting: Pulmonary Disease

## 2023-11-20 ENCOUNTER — Other Ambulatory Visit: Payer: Self-pay

## 2023-11-20 ENCOUNTER — Emergency Department (HOSPITAL_COMMUNITY): Payer: MEDICAID

## 2023-11-20 DIAGNOSIS — A419 Sepsis, unspecified organism: Principal | ICD-10-CM | POA: Diagnosis present

## 2023-11-20 DIAGNOSIS — E871 Hypo-osmolality and hyponatremia: Secondary | ICD-10-CM | POA: Diagnosis present

## 2023-11-20 DIAGNOSIS — E870 Hyperosmolality and hypernatremia: Secondary | ICD-10-CM | POA: Diagnosis present

## 2023-11-20 DIAGNOSIS — Z681 Body mass index (BMI) 19 or less, adult: Secondary | ICD-10-CM

## 2023-11-20 DIAGNOSIS — Z7401 Bed confinement status: Secondary | ICD-10-CM

## 2023-11-20 DIAGNOSIS — E860 Dehydration: Secondary | ICD-10-CM | POA: Diagnosis present

## 2023-11-20 DIAGNOSIS — R739 Hyperglycemia, unspecified: Secondary | ICD-10-CM | POA: Diagnosis not present

## 2023-11-20 DIAGNOSIS — L89226 Pressure-induced deep tissue damage of left hip: Secondary | ICD-10-CM | POA: Diagnosis present

## 2023-11-20 DIAGNOSIS — R64 Cachexia: Secondary | ICD-10-CM | POA: Diagnosis present

## 2023-11-20 DIAGNOSIS — Z931 Gastrostomy status: Secondary | ICD-10-CM

## 2023-11-20 DIAGNOSIS — J189 Pneumonia, unspecified organism: Principal | ICD-10-CM | POA: Diagnosis present

## 2023-11-20 DIAGNOSIS — G9341 Metabolic encephalopathy: Secondary | ICD-10-CM | POA: Diagnosis present

## 2023-11-20 DIAGNOSIS — I110 Hypertensive heart disease with heart failure: Secondary | ICD-10-CM | POA: Diagnosis present

## 2023-11-20 DIAGNOSIS — N400 Enlarged prostate without lower urinary tract symptoms: Secondary | ICD-10-CM | POA: Diagnosis present

## 2023-11-20 DIAGNOSIS — K5900 Constipation, unspecified: Secondary | ICD-10-CM | POA: Diagnosis present

## 2023-11-20 DIAGNOSIS — E8729 Other acidosis: Secondary | ICD-10-CM | POA: Diagnosis present

## 2023-11-20 DIAGNOSIS — E875 Hyperkalemia: Secondary | ICD-10-CM | POA: Diagnosis present

## 2023-11-20 DIAGNOSIS — R6521 Severe sepsis with septic shock: Secondary | ICD-10-CM | POA: Diagnosis present

## 2023-11-20 DIAGNOSIS — R1312 Dysphagia, oropharyngeal phase: Secondary | ICD-10-CM | POA: Diagnosis present

## 2023-11-20 DIAGNOSIS — R1310 Dysphagia, unspecified: Secondary | ICD-10-CM

## 2023-11-20 DIAGNOSIS — F03C0 Unspecified dementia, severe, without behavioral disturbance, psychotic disturbance, mood disturbance, and anxiety: Secondary | ICD-10-CM | POA: Diagnosis present

## 2023-11-20 DIAGNOSIS — I5032 Chronic diastolic (congestive) heart failure: Secondary | ICD-10-CM | POA: Diagnosis present

## 2023-11-20 DIAGNOSIS — D638 Anemia in other chronic diseases classified elsewhere: Secondary | ICD-10-CM | POA: Diagnosis present

## 2023-11-20 DIAGNOSIS — I10 Essential (primary) hypertension: Secondary | ICD-10-CM | POA: Diagnosis present

## 2023-11-20 DIAGNOSIS — Z8614 Personal history of Methicillin resistant Staphylococcus aureus infection: Secondary | ICD-10-CM

## 2023-11-20 DIAGNOSIS — N401 Enlarged prostate with lower urinary tract symptoms: Secondary | ICD-10-CM | POA: Diagnosis present

## 2023-11-20 DIAGNOSIS — R001 Bradycardia, unspecified: Secondary | ICD-10-CM | POA: Diagnosis not present

## 2023-11-20 DIAGNOSIS — J690 Pneumonitis due to inhalation of food and vomit: Secondary | ICD-10-CM | POA: Diagnosis present

## 2023-11-20 DIAGNOSIS — M109 Gout, unspecified: Secondary | ICD-10-CM | POA: Diagnosis present

## 2023-11-20 DIAGNOSIS — F0390 Unspecified dementia without behavioral disturbance: Secondary | ICD-10-CM | POA: Diagnosis present

## 2023-11-20 DIAGNOSIS — Z79890 Hormone replacement therapy (postmenopausal): Secondary | ICD-10-CM

## 2023-11-20 DIAGNOSIS — I468 Cardiac arrest due to other underlying condition: Secondary | ICD-10-CM | POA: Diagnosis present

## 2023-11-20 DIAGNOSIS — Z515 Encounter for palliative care: Secondary | ICD-10-CM

## 2023-11-20 DIAGNOSIS — R7989 Other specified abnormal findings of blood chemistry: Secondary | ICD-10-CM | POA: Diagnosis present

## 2023-11-20 DIAGNOSIS — Z8701 Personal history of pneumonia (recurrent): Secondary | ICD-10-CM

## 2023-11-20 DIAGNOSIS — I358 Other nonrheumatic aortic valve disorders: Secondary | ICD-10-CM | POA: Diagnosis present

## 2023-11-20 DIAGNOSIS — E785 Hyperlipidemia, unspecified: Secondary | ICD-10-CM | POA: Diagnosis present

## 2023-11-20 DIAGNOSIS — E8809 Other disorders of plasma-protein metabolism, not elsewhere classified: Secondary | ICD-10-CM | POA: Diagnosis not present

## 2023-11-20 DIAGNOSIS — J9601 Acute respiratory failure with hypoxia: Secondary | ICD-10-CM | POA: Diagnosis not present

## 2023-11-20 DIAGNOSIS — R627 Adult failure to thrive: Secondary | ICD-10-CM | POA: Diagnosis present

## 2023-11-20 DIAGNOSIS — I2489 Other forms of acute ischemic heart disease: Secondary | ICD-10-CM | POA: Diagnosis present

## 2023-11-20 DIAGNOSIS — E162 Hypoglycemia, unspecified: Secondary | ICD-10-CM | POA: Diagnosis present

## 2023-11-20 DIAGNOSIS — Z79899 Other long term (current) drug therapy: Secondary | ICD-10-CM

## 2023-11-20 DIAGNOSIS — E43 Unspecified severe protein-calorie malnutrition: Secondary | ICD-10-CM | POA: Diagnosis present

## 2023-11-20 DIAGNOSIS — E039 Hypothyroidism, unspecified: Secondary | ICD-10-CM | POA: Diagnosis present

## 2023-11-20 DIAGNOSIS — R6520 Severe sepsis without septic shock: Secondary | ICD-10-CM | POA: Diagnosis present

## 2023-11-20 DIAGNOSIS — N179 Acute kidney failure, unspecified: Secondary | ICD-10-CM | POA: Diagnosis present

## 2023-11-20 DIAGNOSIS — Z66 Do not resuscitate: Secondary | ICD-10-CM | POA: Diagnosis present

## 2023-11-20 LAB — URINALYSIS, W/ REFLEX TO CULTURE (INFECTION SUSPECTED)
Bacteria, UA: NONE SEEN
Bilirubin Urine: NEGATIVE
Glucose, UA: NEGATIVE mg/dL
Hgb urine dipstick: NEGATIVE
Ketones, ur: NEGATIVE mg/dL
Leukocytes,Ua: NEGATIVE
Nitrite: NEGATIVE
Protein, ur: NEGATIVE mg/dL
Specific Gravity, Urine: 1.015 (ref 1.005–1.030)
pH: 5 (ref 5.0–8.0)

## 2023-11-20 LAB — PHOSPHORUS: Phosphorus: 3.3 mg/dL (ref 2.5–4.6)

## 2023-11-20 LAB — COMPREHENSIVE METABOLIC PANEL WITH GFR
ALT: 20 U/L (ref 0–44)
AST: 33 U/L (ref 15–41)
Albumin: 2.4 g/dL — ABNORMAL LOW (ref 3.5–5.0)
Alkaline Phosphatase: 60 U/L (ref 38–126)
Anion gap: 12 (ref 5–15)
BUN: 112 mg/dL — ABNORMAL HIGH (ref 8–23)
CO2: 21 mmol/L — ABNORMAL LOW (ref 22–32)
Calcium: 10.2 mg/dL (ref 8.9–10.3)
Chloride: 123 mmol/L — ABNORMAL HIGH (ref 98–111)
Creatinine, Ser: 2.08 mg/dL — ABNORMAL HIGH (ref 0.61–1.24)
GFR, Estimated: 30 mL/min — ABNORMAL LOW (ref >60)
Glucose, Bld: 159 mg/dL — ABNORMAL HIGH (ref 70–99)
Potassium: 5.7 mmol/L — ABNORMAL HIGH (ref 3.5–5.1)
Sodium: 156 mmol/L — ABNORMAL HIGH (ref 135–145)
Total Bilirubin: 0.7 mg/dL (ref 0.0–1.2)
Total Protein: 8.1 g/dL (ref 6.5–8.1)

## 2023-11-20 LAB — CBC WITH DIFFERENTIAL/PLATELET
Abs Immature Granulocytes: 0.08 10*3/uL — ABNORMAL HIGH (ref 0.00–0.07)
Basophils Absolute: 0 10*3/uL (ref 0.0–0.1)
Basophils Relative: 0 %
Eosinophils Absolute: 0 10*3/uL (ref 0.0–0.5)
Eosinophils Relative: 0 %
HCT: 37.1 % — ABNORMAL LOW (ref 39.0–52.0)
Hemoglobin: 10.7 g/dL — ABNORMAL LOW (ref 13.0–17.0)
Immature Granulocytes: 1 %
Lymphocytes Relative: 7 %
Lymphs Abs: 1.2 10*3/uL (ref 0.7–4.0)
MCH: 26.6 pg (ref 26.0–34.0)
MCHC: 28.8 g/dL — ABNORMAL LOW (ref 30.0–36.0)
MCV: 92.3 fL (ref 80.0–100.0)
Monocytes Absolute: 0.4 10*3/uL (ref 0.1–1.0)
Monocytes Relative: 3 %
Neutro Abs: 14.5 10*3/uL — ABNORMAL HIGH (ref 1.7–7.7)
Neutrophils Relative %: 89 %
Platelets: 229 10*3/uL (ref 150–400)
RBC: 4.02 MIL/uL — ABNORMAL LOW (ref 4.22–5.81)
RDW: 18.8 % — ABNORMAL HIGH (ref 11.5–15.5)
WBC: 16.1 10*3/uL — ABNORMAL HIGH (ref 4.0–10.5)
nRBC: 0 % (ref 0.0–0.2)

## 2023-11-20 LAB — CK: Total CK: 21 U/L — ABNORMAL LOW (ref 49–397)

## 2023-11-20 LAB — RESP PANEL BY RT-PCR (RSV, FLU A&B, COVID)  RVPGX2
Influenza A by PCR: NEGATIVE
Influenza B by PCR: NEGATIVE
Resp Syncytial Virus by PCR: NEGATIVE
SARS Coronavirus 2 by RT PCR: NEGATIVE

## 2023-11-20 LAB — PROTIME-INR
INR: 1.4 — ABNORMAL HIGH (ref 0.8–1.2)
Prothrombin Time: 17 s — ABNORMAL HIGH (ref 11.4–15.2)

## 2023-11-20 LAB — I-STAT CG4 LACTIC ACID, ED: Lactic Acid, Venous: 7.4 mmol/L (ref 0.5–1.9)

## 2023-11-20 LAB — TSH: TSH: 6.87 u[IU]/mL — ABNORMAL HIGH (ref 0.350–4.500)

## 2023-11-20 LAB — TROPONIN I (HIGH SENSITIVITY)
Troponin I (High Sensitivity): 149 ng/L (ref <18)
Troponin I (High Sensitivity): 141 ng/L (ref <18)

## 2023-11-20 LAB — APTT: aPTT: 31 s (ref 24–36)

## 2023-11-20 LAB — I-STAT VENOUS BLOOD GAS, ED
Acid-base deficit: 2 mmol/L (ref 0.0–2.0)
Bicarbonate: 25.1 mmol/L (ref 20.0–28.0)
Calcium, Ion: 1.35 mmol/L (ref 1.15–1.40)
HCT: 47 % (ref 39.0–52.0)
Hemoglobin: 16 g/dL (ref 13.0–17.0)
O2 Saturation: 24 %
Potassium: 5.3 mmol/L — ABNORMAL HIGH (ref 3.5–5.1)
Sodium: 160 mmol/L — ABNORMAL HIGH (ref 135–145)
TCO2: 27 mmol/L (ref 22–32)
pCO2, Ven: 51.6 mmHg (ref 44–60)
pH, Ven: 7.294 (ref 7.25–7.43)
pO2, Ven: 19 mmHg — CL (ref 32–45)

## 2023-11-20 LAB — PROCALCITONIN: Procalcitonin: 2.5 ng/mL

## 2023-11-20 LAB — OSMOLALITY: Osmolality: 381 mosm/kg (ref 275–295)

## 2023-11-20 LAB — MAGNESIUM: Magnesium: 2.7 mg/dL — ABNORMAL HIGH (ref 1.7–2.4)

## 2023-11-20 LAB — CBG MONITORING, ED
Glucose-Capillary: 134 mg/dL — ABNORMAL HIGH (ref 70–99)
Glucose-Capillary: 234 mg/dL — ABNORMAL HIGH (ref 70–99)

## 2023-11-20 LAB — AMMONIA: Ammonia: <13 umol/L (ref 9–35)

## 2023-11-20 MED ORDER — FREE WATER
300.0000 mL | Freq: Four times a day (QID) | Status: DC
  Administered 2023-11-21 – 2023-11-22 (×5): 300 mL

## 2023-11-20 MED ORDER — ACETAMINOPHEN 325 MG PO TABS
650.0000 mg | ORAL_TABLET | Freq: Four times a day (QID) | ORAL | Status: DC | PRN
  Administered 2023-11-22 – 2023-11-29 (×6): 650 mg via ORAL
  Filled 2023-11-20 (×6): qty 2

## 2023-11-20 MED ORDER — DEXTROSE 5 % IV SOLN: INTRAVENOUS | Status: AC

## 2023-11-20 MED ORDER — SODIUM CHLORIDE 0.9 % IV SOLN: INTRAVENOUS | Status: DC

## 2023-11-20 MED ORDER — SODIUM CHLORIDE 0.9 % IV SOLN
INTRAVENOUS | Status: DC
Start: 2023-11-20 — End: 2023-11-20

## 2023-11-20 MED ORDER — LACTATED RINGERS IV BOLUS
1000.0000 mL | Freq: Once | INTRAVENOUS | Status: AC
Start: 2023-11-20 — End: 2023-11-20
  Administered 2023-11-20: 1000 mL via INTRAVENOUS

## 2023-11-20 MED ORDER — ACETAMINOPHEN 650 MG RE SUPP: 650.0000 mg | Freq: Four times a day (QID) | RECTAL | Status: DC | PRN

## 2023-11-20 MED ORDER — INSULIN ASPART 100 UNIT/ML IV SOLN
5.0000 [IU] | Freq: Once | INTRAVENOUS | Status: AC
  Administered 2023-11-20: 5 [IU] via INTRAVENOUS

## 2023-11-20 MED ORDER — ONDANSETRON HCL 4 MG/2ML IJ SOLN
4.0000 mg | Freq: Four times a day (QID) | INTRAMUSCULAR | Status: DC | PRN
  Administered 2023-12-02: 4 mg via INTRAVENOUS
  Filled 2023-11-20: qty 2

## 2023-11-20 MED ORDER — SODIUM CHLORIDE 0.9 % IV SOLN: 1.0000 g | Freq: Once | INTRAVENOUS | Status: DC

## 2023-11-20 MED ORDER — METRONIDAZOLE 500 MG/100ML IV SOLN
500.0000 mg | Freq: Two times a day (BID) | INTRAVENOUS | Status: DC
  Administered 2023-11-20: 500 mg via INTRAVENOUS
  Filled 2023-11-20: qty 100

## 2023-11-20 MED ORDER — VANCOMYCIN HCL 1.25 G IV SOLR
1250.0000 mg | Freq: Once | INTRAVENOUS | Status: AC
  Administered 2023-11-20: 1250 mg via INTRAVENOUS
  Filled 2023-11-20: qty 25

## 2023-11-20 MED ORDER — SODIUM CHLORIDE 0.9 % IV SOLN
2.0000 g | Freq: Once | INTRAVENOUS | Status: AC
  Administered 2023-11-20: 2 g via INTRAVENOUS
  Filled 2023-11-20: qty 12.5

## 2023-11-20 MED ORDER — DEXTROSE 50 % IV SOLN
1.0000 | Freq: Once | INTRAVENOUS | Status: AC
  Administered 2023-11-20: 50 mL via INTRAVENOUS
  Filled 2023-11-20: qty 50

## 2023-11-20 MED ORDER — VANCOMYCIN VARIABLE DOSE PER UNSTABLE RENAL FUNCTION (PHARMACIST DOSING): Status: DC

## 2023-11-20 MED ORDER — SODIUM CHLORIDE 0.9 % IV SOLN: 500.0000 mg | Freq: Once | INTRAVENOUS | Status: DC

## 2023-11-20 MED ORDER — FENTANYL CITRATE PF 50 MCG/ML IJ SOSY
12.5000 ug | PREFILLED_SYRINGE | INTRAMUSCULAR | Status: DC | PRN
  Administered 2023-11-21 – 2023-11-27 (×13): 50 ug via INTRAVENOUS
  Filled 2023-11-20 (×14): qty 1

## 2023-11-20 MED ORDER — ONDANSETRON HCL 4 MG PO TABS: 4.0000 mg | ORAL_TABLET | Freq: Four times a day (QID) | ORAL | Status: DC | PRN

## 2023-11-20 MED ORDER — ALBUTEROL SULFATE (2.5 MG/3ML) 0.083% IN NEBU: 10.0000 mg | INHALATION_SOLUTION | Freq: Once | RESPIRATORY_TRACT | Status: AC

## 2023-11-20 MED ORDER — FREE WATER: 300.0000 mL | Freq: Four times a day (QID) | Status: DC

## 2023-11-20 MED ORDER — ALBUTEROL SULFATE (2.5 MG/3ML) 0.083% IN NEBU
INHALATION_SOLUTION | RESPIRATORY_TRACT | Status: AC
  Administered 2023-11-20: 10 mg via RESPIRATORY_TRACT
  Filled 2023-11-20: qty 12

## 2023-11-20 MED ORDER — LEVOTHYROXINE SODIUM 75 MCG PO TABS
75.0000 ug | ORAL_TABLET | Freq: Every day | ORAL | Status: DC
  Administered 2023-11-21 – 2023-12-08 (×16): 75 ug
  Filled 2023-11-20 (×17): qty 1

## 2023-11-20 MED ORDER — ALBUTEROL SULFATE (2.5 MG/3ML) 0.083% IN NEBU
2.5000 mg | INHALATION_SOLUTION | RESPIRATORY_TRACT | Status: DC | PRN
  Administered 2023-11-22 – 2023-11-25 (×3): 2.5 mg via RESPIRATORY_TRACT
  Filled 2023-11-20 (×4): qty 3

## 2023-11-20 MED ORDER — THIAMINE HCL 100 MG/ML IJ SOLN
100.0000 mg | Freq: Every day | INTRAMUSCULAR | Status: DC
  Administered 2023-11-20 – 2023-11-30 (×10): 100 mg via INTRAVENOUS
  Filled 2023-11-20 (×11): qty 2

## 2023-11-20 MED ORDER — SODIUM CHLORIDE 0.9 % IV SOLN
2.0000 g | INTRAVENOUS | Status: AC
  Administered 2023-11-22 – 2023-11-26 (×5): 2 g via INTRAVENOUS
  Filled 2023-11-20 (×5): qty 12.5

## 2023-11-20 NOTE — Assessment & Plan Note (Signed)
 Chronic stable.

## 2023-11-20 NOTE — Assessment & Plan Note (Signed)
 Chronic recurrent overall very poor prognosis patient is cachectic multiple contractures

## 2023-11-20 NOTE — Assessment & Plan Note (Signed)
 History of elevated troponin in past  has been staying between 120-190s ever since October Patient not endorsing any chest pain Last echogram was done in end of March 2025 showing grade 1 diastolic dysfunction With no regional wall motion abnormalities Persistent elevated troponin likely in the setting of demand ischemia secondary to recurrent sepsis and poor renal clearance

## 2023-11-20 NOTE — Progress Notes (Signed)
 Pharmacy Antibiotic Note  [name] [name] is a 89 y.o. male for which pharmacy has been consulted for cefepime  and vancomycin  dosing for sepsis. Patient with a history of PEG tube placement, hypertension, hypothyroidism, gout and BPH. Patient presenting with urinary frequency and groaning all night.  Patient recently admitted. Sputum cx at that time w/ MRSA and pseudomonas. Received cefepime  and vancomycin , reportedly improved with course. Sent home on levaquin .  SCr 2.08 - AKI WBC 16.1; LA 7.4; T 98.9; HR 112; RR 21 COVID neg / flu neg  4/11 BCx: 1/4 staph hominis 4/11 Sputum: MRSA (Bactrim, Vanc, Linezolid  - S)  pseudomonas (ceftaz I) 4/11 COVID/Flu PCR: neg   Plan: Flagyl  per MD Cefepime  2g q24hr  Vancomycin  1250 mg once, subsequent dosing as indicated per random vancomycin  level until renal function stable and/or improved, at which time scheduled dosing can be considered Monitor WBC, fever, renal function, cultures De-escalate when able  Height: 6' (182.9 cm) Weight: 60 kg (132 lb 4.4 oz) IBW/kg (Calculated) : 77.6  Temp (24hrs), Avg:98.9 F (37.2 C), Min:98.9 F (37.2 C), Max:98.9 F (37.2 C)  Recent Labs  Lab 11/20/23 1945 11/20/23 2112  WBC 16.1*  --   CREATININE 2.08*  --   LATICACIDVEN  --  7.4*    Estimated Creatinine Clearance: 20.8 mL/min (A) (by C-G formula based on SCr of 2.08 mg/dL (H)).    Allergies  Allergen Reactions   Pork-Derived Products Other (See Comments)    Religious Reasons   Microbiology results: Pending  Thank you for allowing pharmacy to be a part of this patient's care.  Dionicio Fray, PharmD, BCPS 11/20/2023 9:22 PM ED Clinical Pharmacist -  [phone]

## 2023-11-20 NOTE — ED Notes (Signed)
 Abnormal Troponin result reported to EDP .

## 2023-11-20 NOTE — Assessment & Plan Note (Addendum)
 Obtain urine electrolytes, rehydrate patient is a very poor candidate for hemodialysis at this point though family feels strongly that they would like to pursue dialysis if needed.  At this point there is no indication for hemodialysis tonight Hope AKI resolved with IV fluid rehydration

## 2023-11-20 NOTE — Assessment & Plan Note (Signed)
-  SIRS criteria met with  elevated white blood cell count,       Component Value Date/Time   WBC 16.1 (H) 11/20/2023 1945   LYMPHSABS 1.2 11/20/2023 1945   LYMPHSABS 1.9 03/10/2022 1143     tachycardia   ,   fever   RR >20 Today's Vitals   11/20/23 1941 11/20/23 1943 11/20/23 2005 11/20/23 2115  BP:  (!) 117/59  107/73  Pulse:  (!) 112  (!) 102  Resp:  (!) 21  (!) 31  Temp: 98.9 F (37.2 C)     TempSrc: Rectal     SpO2:  99%  100%  Weight:      Height:      PainSc:   0-No pain       -Most likely source being:  pulmonary     Patient is meeting criteria for SEPTIC SHOCK with  lactic acid > 4     - Obtain serial lactic acid and procalcitonin level.  - Initiated IV antibiotics in ER: Antibiotics Given (last 72 hours)     Date/Time Action Medication Dose Rate   11/20/23 2123 New Bag/Given   Vancomycin  (VANCOCIN ) 1,250 mg in sodium chloride  0.9 % 250 mL IVPB 1,250 mg 166.7 mL/hr   11/20/23 2124 New Bag/Given   ceFEPIme  (MAXIPIME ) 2 g in sodium chloride  0.9 % 100 mL IVPB 2 g 200 mL/hr   11/20/23 2158 New Bag/Given   metroNIDAZOLE  (FLAGYL ) IVPB 500 mg 500 mg 100 mL/hr       Will continue  on : Cefepime  vancomycin  and Flagyl    - await results of blood and urine culture  - Rehydrate aggressively  Intravenous fluids were administered,         30cc/kg fluid    10:22 PM

## 2023-11-20 NOTE — ED Provider Notes (Signed)
 [facility] Provider Note   CSN: [id number] Arrival date & time: 11/20/23  1933     History {Add pertinent medical, surgical, social history, OB history to HPI:1} Chief Complaint  Patient presents with   Urinary Frequency    [name] [name] is a 89 y.o. male with PMH as listed below who presents with groaning all night. Per family on scene patient is bed bound with dementia at baseline but has been groaning/crying all night and they don't know why. He also has had some cough and green mucous production lately. Per EMS they thought it "smelled like UTI." Patient at baseline cannot answer questions and is contracted in bed. Level 5 caveat.     Past Medical History:  Diagnosis Date   Back pain 10/2008   per MRI 11/06/2008 - severe spinal stenosis worse at L4-5, L3-4, with neural compression at those levels   BPH (benign prostatic hyperplasia)    Eczema    Gout    Hypertension    Hypothyroidism        Home Medications Prior to Admission medications   Medication Sig Start Date End Date Taking? Authorizing Provider  amLODipine  (NORVASC ) 2.5 MG tablet Take 1 tablet (2.5 mg total) by mouth daily. 10/09/23 10/08/24  Doroteo Gasmen, MD  cyanocobalamin  1000 MCG tablet Crush 1 tablet (1,000 mcg total) and dissolve in a small amount of water  to give by feeding tube daily. 09/30/23   Danford, Willis Harter, MD  Iron , Ferrous Sulfate , 325 (65 Fe) MG TABS Take 1 tablet (325 mg) by mouth every other day. Please make an appointment with Dr. Lincoln Renshaw for more refills. Patient taking differently: Place 325 mg into feeding tube every other day. 04/10/23   Lawrance Presume, MD  levothyroxine  (SYNTHROID ) 75 MCG tablet Take 1 tablet (75 mcg total) by mouth daily before breakfast. 04/12/23   Lawrance Presume, MD  lidocaine  (LIDODERM ) 5 % Place 1 patch onto the skin daily. Remove & Discard patch within 12 hours or as directed by MD 10/09/23   Doroteo Gasmen, MD  Mouthwashes (MOUTH RINSE) LIQD solution 15 mLs by Mouth Rinse route 4 (four) times daily. 10/09/23   Doroteo Gasmen, MD  Mouthwashes (MOUTH RINSE) LIQD solution 15 mLs by Mouth Rinse route as needed (oral care). 10/09/23   Doroteo Gasmen, MD  Multiple Vitamin (MULTIVITAMIN WITH MINERALS) TABS tablet Take 1 tablet by mouth daily. 09/30/23   Danford, Willis Harter, MD  Nutritional Supplements (FEEDING SUPPLEMENT, JEVITY 1.5 CAL/FIBER,) LIQD Place 1,440 mLs into feeding tube continuous. 09/30/23   Danford, Willis Harter, MD  Water  For Irrigation, Sterile (FREE WATER ) SOLN Place 300 mLs into feeding tube every 6 (six) hours. 09/30/23   Danford, Willis Harter, MD  pantoprazole  sodium (PROTONIX ) 40 mg Place 40 mg into feeding tube daily. 10/09/23 10/09/23  Doroteo Gasmen, MD      Allergies    Pork-derived products    Review of Systems   Review of Systems A 10 point review of systems was performed and is negative unless otherwise reported in HPI.  Physical Exam Updated Vital Signs Temp 98.9 F (37.2 C) (Rectal)   Ht 6' (1.829 m)   Wt 60 kg   BMI 17.94 kg/m  Physical Exam General: chronically ill-appearing elderly man, cachectic, lying in bed HEENT: PERRLA, Sclera anicteric, dry mucous membranes, trachea midline. Symmetric tonsillar pillars. Cardiology: RRR, no murmurs/rubs/gallops.  Resp: Normal respiratory rate and effort. CTAB, no wheezes,  rhonchi, crackles.  Abd: G tube present. Soft, non-tender, non-distended. No rebound tenderness or guarding.  GU: Deferred. MSK: No peripheral edema or signs of trauma. Extremities without deformity or TTP. No cyanosis or clubbing. Skin: warm, dry. . Back: No CVA tenderness Neuro: groaning/crying out, cranial nerves grossly intact, MAEs but contractured  ED Results / Procedures / Treatments   Labs (all labs ordered are listed, but only abnormal results are displayed) Labs Reviewed  RESP PANEL BY RT-PCR (RSV, FLU A&B, COVID)  [id number]   URINALYSIS, W/ REFLEX TO CULTURE (INFECTION SUSPECTED)  COMPREHENSIVE METABOLIC PANEL WITH GFR  CBC WITH DIFFERENTIAL/PLATELET  TROPONIN I (HIGH SENSITIVITY)    EKG None  Radiology No results found.  Procedures Procedures  {Document cardiac monitor, telemetry assessment procedure when appropriate:1}  Medications Ordered in ED Medications - No data to display  ED Course/ Medical Decision Making/ A&P                          Medical Decision Making Amount and/or Complexity of Data Reviewed Labs:  Decision-making details documented in ED Course. Radiology: ordered. Decision-making details documented in ED Course.    This patient presents to the ED for concern of ***, this involves an extensive number of treatment options, and is a complaint that carries with it a high risk of complications and morbidity.  I considered the following differential and admission for this acute, potentially life threatening condition.   MDM:    ***  Clinical Course as of 11/20/23 2039  Mon Nov 20, 2023  2031 Urinalysis, w/ Reflex to Culture (Infection Suspected) -Urine, Clean Catch(!) Neg for UTI [HN]  2031 WBC(!): 16.1 +leukocytosis w/ left shift [HN]  2039 DG Chest Portable 1 View 1. Patchy left-sided ground-glass airspace disease, improved since prior x-ray, consistent with residual or recurrent pneumonia versus aspiration.   [HN]    Clinical Course User Index [HN] Merdis Stalling, MD    Labs: I Ordered, and personally interpreted labs.  The pertinent results include:  those listed above  Imaging Studies ordered: I ordered imaging studies including CXR I independently visualized and interpreted imaging. I agree with the radiologist interpretation  Additional history obtained from chart review, EMS.  External records from outside source obtained and reviewed including ***  Cardiac Monitoring: The patient was maintained on a cardiac monitor.  I personally viewed and  interpreted the cardiac monitored which showed an underlying rhythm of: NSR  Reevaluation: After the interventions noted above, I reevaluated the patient and found that they have :{resolved/improved/worsened:23923::"improved"}  Social Determinants of Health: Lives at home cared for by family  Disposition:  ***  Co morbidities that complicate the patient evaluation  Past Medical History:  Diagnosis Date   Back pain 10/2008   per MRI 11/06/2008 - severe spinal stenosis worse at L4-5, L3-4, with neural compression at those levels   BPH (benign prostatic hyperplasia)    Eczema    Gout    Hypertension    Hypothyroidism      Medicines No orders of the defined types were placed in this encounter.   I have reviewed the patients home medicines and have made adjustments as needed  Problem List / ED Course: Problem List Items Addressed This Visit   None        {Document critical care time when appropriate:1} {Document review of labs and clinical decision tools ie heart score, Chads2Vasc2 etc:1}  {Document your independent review of radiology images, and  any outside records:1} {Document your discussion with family members, caretakers, and with consultants:1} {Document social determinants of health affecting pt's care:1} {Document your decision making why or why not admission, treatments were needed:1}  This note was created using dictation software, which may contain spelling or grammatical errors.

## 2023-11-20 NOTE — Assessment & Plan Note (Signed)
 Order nutritional consult prealbumin

## 2023-11-20 NOTE — Assessment & Plan Note (Signed)
 Recurrent.  Will rehydrate and follow fluid status With recurrent admissions in the past family felt very strongly that they would not want him to be discharged to [facility]

## 2023-11-20 NOTE — ED Notes (Addendum)
 IV antibiotics infusing , IV nurse at bedside attempting to access 2nd IV and collect additional bleed specimens , blood cultures collected by phlebotomist prior to antibiotics .  [name] [name]

## 2023-11-20 NOTE — Assessment & Plan Note (Signed)
 Chronic stable at this point PEG tube patient is only able to communicate with groans although he did mild thank you to me today Overall very poor prognosis

## 2023-11-20 NOTE — Assessment & Plan Note (Signed)
 Given soft blood pressures and sepsis will allow permissive hypertension

## 2023-11-20 NOTE — Assessment & Plan Note (Signed)
 In the setting of AKI Attempt to rehydrate Can use PEG tube for Lokelma 

## 2023-11-20 NOTE — Assessment & Plan Note (Addendum)
 Last time was treated with cefepime  and vanc Will resume Hx of MRSA , psudomonas

## 2023-11-20 NOTE — H&P (Signed)
 [name] [name] EXB:[id number] DOB: [birth year] DOA: 11/20/2023     PCP: Lawrance Presume, MD     Patient arrived to ER on 11/20/23 at 1933 Referred by Attending Merdis Stalling, MD   Patient coming from:    home Lives      With family      Chief Complaint:   Chief Complaint  Patient presents with   Urinary Frequency    HPI: [name] [name] is a 89 y.o. male with medical history significant of dementia dysphagia status post PEG tube, hypertension hyperlipidemia hypothyroidism gout BPH    Presented with   fevers chills dysuria increased increased frequency in urination Patient was brought in from home for possible UTI with increased frequency in urination   bedbound at baseline At baseline moans throughout the day Noted to have a cough and possibly fever known history of dementia In the past patient have had PEG tube placement History of aspiration pneumonia in the past    Has been debilitated since COVID and Salmonella in the past.  Denies significant ETOH intake   Does not smoke       Regarding pertinent Chronic problems:       HTN on Norvasc    chronic CHF diastolic - last echo  Recent Results (from the past 10272 hours)  ECHOCARDIOGRAM COMPLETE   Collection Time: 09/23/23 12:10 PM  Result Value   Weight 1,802.48   Height 72   BP 121/73   S' Lateral 2.60   Area-P 1/2 2.75   Est EF 60 - 65%   Narrative      ECHOCARDIOGRAM REPORT       1. Left ventricular ejection fraction, by estimation, is 60 to 65%. The left ventricle has normal function. The left ventricle has no regional wall motion abnormalities. There is mild concentric left ventricular hypertrophy. Left ventricular diastolic  parameters are consistent with Grade I diastolic dysfunction (impaired relaxation).  2. Right ventricular systolic function is normal. The right ventricular size is normal.  3. The mitral valve is normal in structure. Trivial mitral valve regurgitation. No evidence of mitral stenosis.   4. The aortic valve is tricuspid. Aortic valve regurgitation is not visualized. Aortic valve sclerosis is present, with no evidence of aortic valve stenosis. IVC not well visualzied  Comparison(s): No significant change from prior study. Prior images reviewed side by side. Suboptimal imaging windows in this study; patient physicaly unable to maneuver for optimization.              Hypothyroidism:   Lab Results  Component Value Date   TSH 54.339 (H) 10/06/2023   T3TOTAL 45 (L) 09/20/2023   on synthroid         Dementia -status post PEG tube    Chronic anemia - baseline hg Hemoglobin & Hematocrit  Recent Labs    10/07/23 0214 10/08/23 0201 11/20/23 1945  HGB 7.3* 7.5* 10.7*   Iron /TIBC/Ferritin/ %Sat    Component Value Date/Time   IRON  16 (L) 09/20/2023 2338   IRON  33 (L) 12/07/2021 1200   TIBC 197 (L) 09/20/2023 2338   TIBC 184 (L) 12/07/2021 1200   FERRITIN 352 (H) 09/20/2023 2338   FERRITIN 294 12/07/2021 1200   IRONPCTSAT 8 (L) 09/20/2023 2338   IRONPCTSAT 18 12/07/2021 1200      While in ER: Clinical Course as of 11/20/23 2117  Mon Nov 20, 2023  2031 Urinalysis, w/ Reflex to Culture (Infection Suspected) -Urine, Clean Catch(!) Neg for UTI [HN]  2031 WBCAaron Aas):  16.1 +leukocytosis w/ left shift [HN]  2039 DG Chest Portable 1 View 1. Patchy left-sided ground-glass airspace disease, improved since prior x-ray, consistent with residual or recurrent pneumonia versus aspiration.   [HN]  2111 Troponin I (High Sensitivity)(!!): 149 Elevated, will trend [HN]  2111 Sodium(!): 156 +hypernatremic, +AKI, hyperkalemia, likely dehydrated [HN]  2114 MRSA and pseudomonas on sputum culture in April. Giving vanc/cefepime . [HN]  2114 Lactic Acid, Venous(!!): 7.4 Receiving fluids [HN]  2116 D/w Dr. Hendrick Locke who recommends reaching out to PCCM.  [HN]    Clinical Course User Index [HN] Merdis Stalling, MD       Lab Orders         Resp panel by RT-PCR (RSV, Flu A&B, Covid)  Anterior Nasal Swab         Blood culture (routine x 2)         Urinalysis, w/ Reflex to Culture (Infection Suspected) -Urine, Clean Catch         Comprehensive metabolic panel         CBC with Differential         Protime-INR         POC CBG, ED         I-Stat Lactic Acid, ED        CXR -patient left-sided groundglass airspace disease improved since prior residual recurrent pneumonia versus aspiration as possible  CTabd/pelvis - ordered    Following Medications were ordered in ER: Medications  lactated ringers  bolus 1,000 mL (has no administration in time range)  ceFEPIme  (MAXIPIME ) 2 g in sodium chloride  0.9 % 100 mL IVPB (has no administration in time range)  Vancomycin  (VANCOCIN ) 1,250 mg in sodium chloride  0.9 % 250 mL IVPB (has no administration in time range)    _______________________________________________________ ER Provider Called:     PCCM They Recommend admit to medicine          ED Triage Vitals  Encounter Vitals Group     BP 11/20/23 1943 (!) 117/59     Systolic BP Percentile --      Diastolic BP Percentile --      Pulse Rate 11/20/23 1943 (!) 112     Resp 11/20/23 1943 (!) 21     Temp 11/20/23 1941 98.9 F (37.2 C)     Temp Source 11/20/23 1941 Rectal     SpO2 11/20/23 1943 99 %     Weight 11/20/23 1939 132 lb 4.4 oz (60 kg)     Height 11/20/23 1939 6' (1.829 m)     Head Circumference --      Peak Flow --      Pain Score 11/20/23 2005 0     Pain Loc --      Pain Education --      Exclude from Growth Chart --   ZHYQ(65)@     _________________________________________ Significant initial  Findings: Abnormal Labs Reviewed  URINALYSIS, W/ REFLEX TO CULTURE (INFECTION SUSPECTED) - Abnormal; Notable for the following components:      Result Value   APPearance HAZY (*)    All other components within normal limits  COMPREHENSIVE METABOLIC PANEL WITH GFR - Abnormal; Notable for the following components:   Sodium 156 (*)    Potassium 5.7 (*)     Chloride 123 (*)    CO2 21 (*)    Glucose, Bld 159 (*)    BUN 112 (*)    Creatinine, Ser 2.08 (*)    Albumin  2.4 (*)  GFR, Estimated 30 (*)    All other components within normal limits  CBC WITH DIFFERENTIAL/PLATELET - Abnormal; Notable for the following components:   WBC 16.1 (*)    RBC 4.02 (*)    Hemoglobin 10.7 (*)    HCT 37.1 (*)    MCHC 28.8 (*)    RDW 18.8 (*)    Neutro Abs 14.5 (*)    Abs Immature Granulocytes 0.08 (*)    All other components within normal limits  CBG MONITORING, ED - Abnormal; Notable for the following components:   Glucose-Capillary 134 (*)    All other components within normal limits  TROPONIN I (HIGH SENSITIVITY) - Abnormal; Notable for the following components:   Troponin I (High Sensitivity) 149 (*)    All other components within normal limits      _________________________ Troponin  ordered Cardiac Panel (last 3 results) Recent Labs    11/20/23 1945  TROPONINIHS 149*     ECG: Ordered Personally reviewed and interpreted by me showing: HR : 109 Rhythm: Sinus tachycardia Ventricular premature complex Borderline right axis deviation QTC 463   ____________________ This patient meets SIRS Criteria and may be septic.   The recent clinical data is shown below. Vitals:   11/20/23 1939 11/20/23 1941 11/20/23 1943  BP:   (!) 117/59  Pulse:   (!) 112  Resp:   (!) 21  Temp:  98.9 F (37.2 C)   TempSrc:  Rectal   SpO2:   99%  Weight: 60 kg    Height: 6' (1.829 m)      WBC     Component Value Date/Time   WBC 16.1 (H) 11/20/2023 1945   LYMPHSABS 1.2 11/20/2023 1945   LYMPHSABS 1.9 03/10/2022 1143   MONOABS 0.4 11/20/2023 1945   EOSABS 0.0 11/20/2023 1945   EOSABS 0.2 03/10/2022 1143   BASOSABS 0.0 11/20/2023 1945   BASOSABS 0.1 03/10/2022 1143     Lactic Acid, Venous    Component Value Date/Time   LATICACIDVEN 7.4 (HH) 11/20/2023 2112      Procalcitonin   Ordered      UA   no evidence of UTI      Urine analysis:     Component Value Date/Time   COLORURINE YELLOW 11/20/2023 1955   APPEARANCEUR HAZY (A) 11/20/2023 1955   LABSPEC 1.015 11/20/2023 1955   PHURINE 5.0 11/20/2023 1955   GLUCOSEU NEGATIVE 11/20/2023 1955   HGBUR NEGATIVE 11/20/2023 1955   BILIRUBINUR NEGATIVE 11/20/2023 1955   KETONESUR NEGATIVE 11/20/2023 1955   PROTEINUR NEGATIVE 11/20/2023 1955   UROBILINOGEN 0.2 12/03/2021 1921   NITRITE NEGATIVE 11/20/2023 1955   LEUKOCYTESUR NEGATIVE 11/20/2023 1955    Results for orders placed or performed during the hospital encounter of 11/20/23  Resp panel by RT-PCR (RSV, Flu A&B, Covid) Anterior Nasal Swab     Status: None   Collection Time: 11/20/23  7:40 PM   Specimen: Anterior Nasal Swab  Result Value Ref Range Status   SARS Coronavirus 2 by RT PCR NEGATIVE NEGATIVE Final   Influenza A by PCR NEGATIVE NEGATIVE Final   Influenza B by PCR NEGATIVE NEGATIVE Final         Resp Syncytial Virus by PCR NEGATIVE NEGATIVE Final          ABX started Antibiotics Given (last 72 hours)     None        Susceptibility data from last 90 days. Collected Specimen Info Organism Ceftazidime Ciprofloxacin  Clindamycin Erythromycin Gentamicin Susc lslt Imipenem  Inducible Clindamycin Linezolid  Oxacillin Rifampin TELAVANCIN TETRACYCLINE Trimethoprim/Sulfa  10/06/23  Methicillin resistant staphylococcus aureus   S  R  R  S   R  S  R  S  S  R  S    Pseudomonas aeruginosa  I  S    S  S            ________________________________________________________________  Venous  Blood Gas result:  pH   7.294 Sodium 160 High  mmol/L   pCO2, Ven 51.6 mmHg Potassium 5.3 High  mmol/L  pO2, Ven 19 Low Panic  mmHg       __________________________________________________________ Recent Labs  Lab 11/20/23 1945 11/20/23 2119 11/20/23 2151  NA 156*  --  160*  K 5.7*  --  5.3*  CO2 21*  --   --   GLUCOSE 159*  --   --   BUN 112*  --   --   CREATININE 2.08*  --   --   CALCIUM 10.2  --   --   MG  --  2.7*  --    PHOS  --  3.3  --     Cr   Up from baseline see below Lab Results  Component Value Date   CREATININE 2.08 (H) 11/20/2023   CREATININE 0.96 10/09/2023   CREATININE 1.10 10/08/2023    Recent Labs  Lab 11/20/23 1945  AST 33  ALT 20  ALKPHOS 60  BILITOT 0.7  PROT 8.1  ALBUMIN  2.4*   Lab Results  Component Value Date   CALCIUM 10.2 11/20/2023   PHOS 2.6 10/09/2023    Plt: Lab Results  Component Value Date   PLT 229 11/20/2023    Recent Labs  Lab 11/20/23 1945  WBC 16.1*  NEUTROABS 14.5*  HGB 10.7*  HCT 37.1*  MCV 92.3  PLT 229    HG/HCT stable,     Component Value Date/Time   HGB 10.7 (L) 11/20/2023 1945   HGB 9.4 (L) 05/05/2023 1102   HCT 37.1 (L) 11/20/2023 1945   HCT 29.0 (L) 05/05/2023 1102   MCV 92.3 11/20/2023 1945   MCV 92 05/05/2023 1102     Hospitalist was called for admission for   Recurrent pneumonia     The following Work up has been ordered so far:  Orders Placed This Encounter  Procedures   Resp panel by RT-PCR (RSV, Flu A&B, Covid) Anterior Nasal Swab   Blood culture (routine x 2)   DG Chest Portable 1 View   Urinalysis, w/ Reflex to Culture (Infection Suspected) -Urine, Clean Catch   Comprehensive metabolic panel   CBC with Differential   Protime-INR   Diet NPO time specified   In and Out Cath   ED Cardiac monitoring   Document height and weight   Assess and Document Glasgow Coma Scale   Document vital signs within 1-hour of fluid bolus completion. Notify provider of abnormal vital signs despite fluid resuscitation.   DO NOT delay antibiotics if unable to obtain blood culture.   Refer to Sidebar Report: Sepsis Sidebar ED/IP   Notify provider for difficulties obtaining IV access.   Insert peripheral IV x 2   Initiate Carrier Fluid Protocol   Code Sepsis activation.  This occurs automatically when order is signed and prioritizes pharmacy, lab, and radiology services for STAT collections and interventions.  If [facility] downtime, call  [facility] [phone]) to activate Code Sepsis.   monitoring by pharmacy   Consult for [facility] Admission   Pulse  oximetry, continuous   POC CBG, ED   I-Stat Lactic Acid, ED   ED EKG   EKG 12-Lead   ED EKG   EKG 12-Lead     OTHER Significant initial  Findings:  labs showing:     DM  labs:  HbA1C: Recent Labs    04/17/23 1831  HGBA1C 5.4       CBG (last 3)  Recent Labs    11/20/23 1947  GLUCAP 134*          Cultures:    Component Value Date/Time   SDES EXPECTORATED SPUTUM 10/06/2023 1819   SDES EXPECTORATED SPUTUM 10/06/2023 1819   SPECREQUEST NONE 10/06/2023 1819   SPECREQUEST NONE Reflexed from [id number] 10/06/2023 1819   CULT  10/06/2023 1819    ABUNDANT METHICILLIN RESISTANT STAPHYLOCOCCUS AUREUS FEW PSEUDOMONAS AERUGINOSA WITHIN MIXED ORGANISMS Performed at [facility], [address] [place], [place] [address]    REPTSTATUS 10/07/2023 FINAL 10/06/2023 1819   REPTSTATUS 10/10/2023 FINAL 10/06/2023 1819     Radiological Exams on Admission: DG Chest Portable 1 View Result Date: 11/20/2023 CLINICAL DATA:  Productive cough, dementia EXAM: PORTABLE CHEST 1 VIEW COMPARISON:  10/06/2023 FINDINGS: Two frontal views of the chest demonstrate an unremarkable cardiac silhouette. There are patchy ground-glass opacities throughout the left lung, with overall improvement in appearance since prior study. It is unclear whether this reflects residual pneumonia or recurrent infection or aspiration. No effusion or pneumothorax. Chronic nonunion left humeral neck fracture. No acute bony abnormalities. IMPRESSION: 1. Patchy left-sided ground-glass airspace disease, improved since prior x-ray, consistent with residual or recurrent pneumonia versus aspiration. Electronically Signed   By: Bobbye Burrow M.D.   On: 11/20/2023 20:31   _______________________________________________________________________________________________________ Latest  Blood pressure (!)  117/59, pulse (!) 112, temperature 98.9 F (37.2 C), temperature source Rectal, resp. rate (!) 21, height 6' (1.829 m), weight 60 kg, SpO2 99%.   Vitals  labs and radiology finding personally reviewed  Review of Systems:    Pertinent ,positives include: , Fevers, chills, fatigue, non-productive cough  Constitutional:  No weight loss, night sweats weight loss  HEENT:  No headaches, Difficulty swallowing,Tooth/dental problems,Sore throat,  No sneezing, itching, ear ache, nasal congestion, post nasal drip,  Cardio-vascular:  No chest pain, Orthopnea, PND, anasarca, dizziness, palpitations.no Bilateral lower extremity swelling  GI:  No heartburn, indigestion, abdominal pain, nausea, vomiting, diarrhea, change in bowel habits, loss of appetite, melena, blood in stool, hematemesis Resp:  no shortness of breath at rest. No dyspnea on exertion, No excess mucus, no productive cough,  No coughing up of blood.No change in color of mucus.No wheezing. Skin:  no rash or lesions. No jaundice GU:  no dysuria, change in color of urine, no urgency or frequency. No straining to urinate.  No flank pain.  Musculoskeletal:  No joint pain or no joint swelling. No decreased range of motion. No back pain.  Psych:  No change in mood or affect. No depression or anxiety. No memory loss.  Neuro: no localizing neurological complaints, no tingling, no weakness, no double vision, no gait abnormality, no slurred speech, no confusion  All systems reviewed and apart from HOPI all are negative _______________________________________________________________________________________________ Past Medical History:   Past Medical History:  Diagnosis Date   Back pain 10/2008   per MRI 11/06/2008 - severe spinal stenosis worse at L4-5, L3-4, with neural compression at those levels   BPH (benign prostatic hyperplasia)    Eczema    Gout    Hypertension  Hypothyroidism       Past Surgical History:  Procedure  Laterality Date   ESOPHAGOGASTRODUODENOSCOPY N/A 04/03/2022   Procedure: ESOPHAGOGASTRODUODENOSCOPY (EGD);  Surgeon: Alvis Jourdain, MD;  Location: [facility] ENDOSCOPY;  Service: Gastroenterology;  Laterality: N/A;   IR REPLC GASTRO/COLONIC TUBE PERCUT W/FLUORO  09/29/2023   LAMINECTOMY  12/11/2008    Bilateral L2, L3, and L4 laminotomies, done by Dr. Larrie Po   LAPAROSCOPY N/A 04/06/2022   Procedure: LAPAROSCOPIC ASSISTED PERCUTANEOUS ENDOSCOPIC GASTROSTOMY PLACEMENT;  Surgeon: Dareen Ebbing, MD;  Location: [facility] OR;  Service: General;  Laterality: N/A;    Social History:  Ambulatory  bed bound     reports that he has never smoked. He has never used smokeless tobacco. He reports that he does not drink alcohol and does not use drugs.     Family History:   Family History  Problem Relation Age of Onset   Stroke Neg Hx    Cancer Neg Hx    ______________________________________________________________________________________________ Allergies: Allergies  Allergen Reactions   Pork-Derived Products Other (See Comments)    Religious Reasons     Prior to Admission medications   Medication Sig Start Date End Date Taking? Authorizing Provider  amLODipine  (NORVASC ) 2.5 MG tablet Take 1 tablet (2.5 mg total) by mouth daily. 10/09/23 10/08/24  Doroteo Gasmen, MD  cyanocobalamin  1000 MCG tablet Crush 1 tablet (1,000 mcg total) and dissolve in a small amount of water  to give by feeding tube daily. 09/30/23   Danford, Willis Harter, MD  Iron , Ferrous Sulfate , 325 (65 Fe) MG TABS Take 1 tablet (325 mg) by mouth every other day. Please make an appointment with Dr. Lincoln Renshaw for more refills. Patient taking differently: Place 325 mg into feeding tube every other day. 04/10/23   Lawrance Presume, MD  levothyroxine  (SYNTHROID ) 75 MCG tablet Take 1 tablet (75 mcg total) by mouth daily before breakfast. 04/12/23   Lawrance Presume, MD  lidocaine  (LIDODERM ) 5 % Place 1 patch onto the skin daily. Remove &  Discard patch within 12 hours or as directed by MD 10/09/23   Doroteo Gasmen, MD  Mouthwashes (MOUTH RINSE) LIQD solution 15 mLs by Mouth Rinse route 4 (four) times daily. 10/09/23   Doroteo Gasmen, MD  Mouthwashes (MOUTH RINSE) LIQD solution 15 mLs by Mouth Rinse route as needed (oral care). 10/09/23   Doroteo Gasmen, MD  Multiple Vitamin (MULTIVITAMIN WITH MINERALS) TABS tablet Take 1 tablet by mouth daily. 09/30/23   Danford, Willis Harter, MD  Nutritional Supplements (FEEDING SUPPLEMENT, JEVITY 1.5 CAL/FIBER,) LIQD Place 1,440 mLs into feeding tube continuous. 09/30/23   Danford, Willis Harter, MD  Water  For Irrigation, Sterile (FREE WATER ) SOLN Place 300 mLs into feeding tube every 6 (six) hours. 09/30/23   Danford, Willis Harter, MD  pantoprazole  sodium (PROTONIX ) 40 mg Place 40 mg into feeding tube daily. 10/09/23 10/09/23  Fonnie Iba I, MD    ___________________________________________________________________________________________________ Physical Exam:    11/20/2023    7:43 PM 11/20/2023    7:39 PM 10/18/2023    2:34 PM  Vitals with BMI  Height  6\' 0"    Weight  132 lbs 4 oz   BMI  17.94   Systolic 117  129  Diastolic 59  75  Pulse 112       1. General:  in No  Acute distress    Chronically ill cachectic -appearing 2. Psychological: Alert not oriented 3. Head/ENT:    Dry Mucous Membranes  Head Non traumatic, neck supple                           Poor Dentition 4. SKIN:  decreased Skin turgor,  Skin clean Dry and intact no rash    5. Heart: Regular rate and rhythm no  Murmur, no Rub or gallop 6. Lungs:   no wheezes some crackles   7. Abdomen: Soft,  non-tender thin non distended bowel sounds present 8. Lower extremities: no clubbing, cyanosis, no  edema contracted from old  12. MSK: Decreased l range of motion    Chart has been  reviewed  ______________________________________________________________________________________________  Assessment/Plan 89 y.o. male with medical history significant of dementia dysphagia status post PEG tube, hypertension hyperlipidemia hypothyroidism gout BPH   Admitted for   Recurrent pneumonia septic shock Overall very poor prognosis    Present on Admission:  AKI (acute kidney injury) (HCC)  Aspiration pneumonia (HCC)  Septic shock (HCC)  Anemia, chronic disease  Dehydration  Elevated troponin  Essential hypertension  Failure to thrive in adult  Hyperkalemia  Protein-calorie malnutrition, severe  Hypernatremia  Hypothyroidism     AKI (acute kidney injury) (HCC) Obtain urine electrolytes, rehydrate patient is a very poor candidate for hemodialysis at this point though family feels strongly that they would like to pursue dialysis if needed.  At this point there is no indication for hemodialysis tonight Hope AKI resolved with IV fluid rehydration  Aspiration pneumonia (HCC) Last time was treated with cefepime  and vanc Will resume Hx of MRSA , psudomonas  Anemia, chronic disease Obtain anemia panel  Transfuse for Hg <7 , rapidly dropping or  if symptomatic   Dehydration Recurrent.  Will rehydrate and follow fluid status With recurrent admissions in the past family felt very strongly that they would not want him to be discharged to SNF  Dysphagia Chronic stable at this point PEG tube patient is only able to communicate with groans although he did mild thank you to me today Overall very poor prognosis  Elevated troponin History of elevated troponin in past  has been staying between 120-190s ever since October Patient not endorsing any chest pain Last echogram was done in end of March 2025 showing grade 1 diastolic dysfunction With no regional wall motion abnormalities Persistent elevated troponin likely in the setting of demand ischemia secondary to recurrent  sepsis and poor renal clearance  Essential hypertension Given soft blood pressures and sepsis will allow permissive hypertension  Failure to thrive in adult Chronic recurrent overall very poor prognosis patient is cachectic multiple contractures  Hyperkalemia In the setting of AKI Attempt to rehydrate Can use PEG tube for Lokelma    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    Protein-calorie malnutrition, severe Order nutritional consult prealbumin  Septic shock (HCC)  -SIRS criteria met with  elevated white blood cell count,       Component Value Date/Time   WBC 16.1 (H) 11/20/2023 1945   LYMPHSABS 1.2 11/20/2023 1945   LYMPHSABS 1.9 03/10/2022 1143     tachycardia   ,   fever   RR >20 Today's Vitals   11/20/23 1941 11/20/23 1943 11/20/23 2005 11/20/23 2115  BP:  (!) 117/59  107/73  Pulse:  (!) 112  (!) 102  Resp:  (!) 21  (!) 31  Temp: 98.9 F (37.2 C)     TempSrc: Rectal     SpO2:  99%  100%  Weight:  Height:      PainSc:   0-No pain       -Most likely source being:  pulmonary     Patient is meeting criteria for SEPTIC SHOCK with  lactic acid > 4     - Obtain serial lactic acid and procalcitonin level.  - Initiated IV antibiotics in ER: Antibiotics Given (last 72 hours)     Date/Time Action Medication Dose Rate   11/20/23 2123 New Bag/Given   Vancomycin  (VANCOCIN ) 1,250 mg in sodium chloride  0.9 % 250 mL IVPB 1,250 mg 166.7 mL/hr   11/20/23 2124 New Bag/Given   ceFEPIme  (MAXIPIME ) 2 g in sodium chloride  0.9 % 100 mL IVPB 2 g 200 mL/hr   11/20/23 2158 New Bag/Given   metroNIDAZOLE  (FLAGYL ) IVPB 500 mg 500 mg 100 mL/hr       Will continue  on : Cefepime  vancomycin  and Flagyl    - await results of blood and urine culture  - Rehydrate aggressively  Intravenous fluids were administered,         30cc/kg fluid    10:22 PM   Hypernatremia In the setting of severe dehydration. Can use PEG tube for free water   boluses Monitor sodium  Hypothyroidism - Check TSH continue home medications Synthroid  at 75mcg po q day    Other plan as per orders.  DVT prophylaxis:  SCD      Code Status:    Code Status: Prior FULL CODE as per  family  I had personally discussed CODE STATUS with  family   ACP   none   Family Communication:   Family  at  Bedside  plan of care was discussed  with   Son,   Diet  Diet Orders (From admission, onward)     Start     Ordered   11/20/23 2043  Diet NPO time specified  (Septic presentation on arrival (screening labs, nursing and treatment orders for obvious sepsis))  Diet effective now        11/20/23 2043            Disposition Plan:      To home once workup is complete and patient is stable   Following barriers for discharge:                                                         Electrolytes corrected                               Anemia stable                                                          Afebrile, white count improving able to transition to PO antibiotics                                                   Transition of care consulted  Nutrition    consulted                                     Palliative care    consulted                                      Consults called:    NONE   Admission status:  ED Disposition     ED Disposition  Admit   Condition  --   Comment  Hospital Area: [facility] [name] [[id number]]  Level of Care: Progressive [102]  Admit to Progressive based on following criteria: MULTISYSTEM THREATS such as stable sepsis, metabolic/electrolyte imbalance with or without encephalopathy that is responding to early treatment.  May admit patient to [facility] or [facility] if equivalent level of care is available:: No  Covid Evaluation: Asymptomatic - no recent exposure (last 10 days) testing not required  Diagnosis: Septic shock [name]) [[id number]]  Admitting Physician: Tanaysia Bhardwaj  [[id number]]  Attending Physician: Jeneva Schweizer [[id number]]  Certification:: I certify this patient will need inpatient services for at least 2 midnights  Expected Medical Readiness: 11/23/2023            inpatient     I Expect 2 midnight stay secondary to severity of patient's current illness need for inpatient interventions justified by the following:  hemodynamic instability despite optimal treatment (tachycardia  )   Severe lab/radiological/exam abnormalities including:    Recurrent pneumonia    and extensive comorbidities including: dementia   That are currently affecting medical management.   I expect  patient to be hospitalized for 2 midnights requiring inpatient medical care.  Patient is at high risk for adverse outcome (such as loss of life or disability) if not treated.  Indication for inpatient stay as follows:  Severe change from baseline regarding mental status Hemodynamic instability despite maximal medical therapy,    Need for IV antibiotics, IV fluids,,     Level of care     progressive     Critical   Patient is critically ill due to  hemodynamic instability severe sepsis  They are at high risk for life/limb threatening clinical deterioration requiring frequent reassessment and modifications of care.  Services provided include examination of the patient, review of relevant ancillary tests, prescription of lifesaving therapies, review of medications and prophylactic therapy.  Total critical care time excluding separately billable procedures: 60  Minutes.    Moneka Mcquinn 11/20/2023, 10:37 PM    [facility]     after 2 AM please page floor coverage   If 7AM-7PM, please contact the day team taking care of the patient using [email or web address]

## 2023-11-20 NOTE — Subjective & Objective (Signed)
 Patient was brought in from home for possible UTI with increased frequency in urination   bedbound at baseline At baseline moans throughout the day Noted to have a cough and possibly fever known history of dementia In the past patient have had PEG tube placement History of aspiration pneumonia in the past

## 2023-11-20 NOTE — Assessment & Plan Note (Signed)
 Obtain anemia panel  Transfuse for Hg <7 , rapidly dropping or  if symptomatic

## 2023-11-20 NOTE — ED Notes (Signed)
 Unable to access 2nd IV site due to minute veins , IV team consult ordered.

## 2023-11-20 NOTE — Sepsis Progress Note (Addendum)
 Elink monitoring for the code sepsis protocol.  Verified with bedside RN that antibiotics were started after blood cultures were collected.

## 2023-11-20 NOTE — Assessment & Plan Note (Signed)
 -  Check TSH continue home medications Synthroid at po q day

## 2023-11-20 NOTE — ED Triage Notes (Signed)
 Pt arrive by EMS from home for possible UTI, pt normally bed bound,  no verbal is been moaning all day with pain, persistent cough with greenish secretion and hot to touch. Hx of dementia.

## 2023-11-20 NOTE — Sepsis Progress Note (Signed)
 Notified provider of need to order fluid bolus.  ?

## 2023-11-20 NOTE — Assessment & Plan Note (Signed)
 In the setting of severe dehydration. Can use PEG tube for free water  boluses Monitor sodium

## 2023-11-21 ENCOUNTER — Ambulatory Visit: Payer: MEDICAID | Attending: Internal Medicine

## 2023-11-21 ENCOUNTER — Inpatient Hospital Stay (HOSPITAL_COMMUNITY): Payer: MEDICAID

## 2023-11-21 DIAGNOSIS — Z7189 Other specified counseling: Secondary | ICD-10-CM

## 2023-11-21 DIAGNOSIS — Z515 Encounter for palliative care: Secondary | ICD-10-CM

## 2023-11-21 LAB — BASIC METABOLIC PANEL WITH GFR
Anion gap: 10 (ref 5–15)
Anion gap: 7 (ref 5–15)
BUN: 110 mg/dL — ABNORMAL HIGH (ref 8–23)
BUN: 85 mg/dL — ABNORMAL HIGH (ref 8–23)
CO2: 21 mmol/L — ABNORMAL LOW (ref 22–32)
CO2: 24 mmol/L (ref 22–32)
Calcium: 9.6 mg/dL (ref 8.9–10.3)
Calcium: 9.5 mg/dL (ref 8.9–10.3)
Chloride: 125 mmol/L — ABNORMAL HIGH (ref 98–111)
Chloride: 117 mmol/L — ABNORMAL HIGH (ref 98–111)
Creatinine, Ser: 1.89 mg/dL — ABNORMAL HIGH (ref 0.61–1.24)
Creatinine, Ser: 1.6 mg/dL — ABNORMAL HIGH (ref 0.61–1.24)
GFR, Estimated: 34 mL/min — ABNORMAL LOW (ref >60)
GFR, Estimated: 41 mL/min — ABNORMAL LOW (ref >60)
Glucose, Bld: 165 mg/dL — ABNORMAL HIGH (ref 70–99)
Glucose, Bld: 98 mg/dL (ref 70–99)
Potassium: 4.7 mmol/L (ref 3.5–5.1)
Potassium: 4.7 mmol/L (ref 3.5–5.1)
Sodium: 156 mmol/L — ABNORMAL HIGH (ref 135–145)
Sodium: 148 mmol/L — ABNORMAL HIGH (ref 135–145)

## 2023-11-21 LAB — CBC
HCT: 33.4 % — ABNORMAL LOW (ref 39.0–52.0)
Hemoglobin: 9.6 g/dL — ABNORMAL LOW (ref 13.0–17.0)
MCH: 26.7 pg (ref 26.0–34.0)
MCHC: 28.7 g/dL — ABNORMAL LOW (ref 30.0–36.0)
MCV: 92.8 fL (ref 80.0–100.0)
Platelets: 164 10*3/uL (ref 150–400)
RBC: 3.6 MIL/uL — ABNORMAL LOW (ref 4.22–5.81)
RDW: 18.9 % — ABNORMAL HIGH (ref 11.5–15.5)
WBC: 14.5 10*3/uL — ABNORMAL HIGH (ref 4.0–10.5)
nRBC: 0 % (ref 0.0–0.2)

## 2023-11-21 LAB — COMPREHENSIVE METABOLIC PANEL WITH GFR
ALT: 20 U/L (ref 0–44)
AST: 32 U/L (ref 15–41)
Albumin: 2.3 g/dL — ABNORMAL LOW (ref 3.5–5.0)
Alkaline Phosphatase: 64 U/L (ref 38–126)
Anion gap: 13 (ref 5–15)
BUN: 104 mg/dL — ABNORMAL HIGH (ref 8–23)
CO2: 21 mmol/L — ABNORMAL LOW (ref 22–32)
Calcium: 9.7 mg/dL (ref 8.9–10.3)
Chloride: 120 mmol/L — ABNORMAL HIGH (ref 98–111)
Creatinine, Ser: 1.69 mg/dL — ABNORMAL HIGH (ref 0.61–1.24)
GFR, Estimated: 39 mL/min — ABNORMAL LOW (ref >60)
Glucose, Bld: 101 mg/dL — ABNORMAL HIGH (ref 70–99)
Potassium: 5 mmol/L (ref 3.5–5.1)
Sodium: 154 mmol/L — ABNORMAL HIGH (ref 135–145)
Total Bilirubin: 0.7 mg/dL (ref 0.0–1.2)
Total Protein: 7.2 g/dL (ref 6.5–8.1)

## 2023-11-21 LAB — GLUCOSE, CAPILLARY
Glucose-Capillary: 72 mg/dL (ref 70–99)
Glucose-Capillary: 90 mg/dL (ref 70–99)

## 2023-11-21 LAB — MAGNESIUM: Magnesium: 2.5 mg/dL — ABNORMAL HIGH (ref 1.7–2.4)

## 2023-11-21 LAB — LACTIC ACID, PLASMA
Lactic Acid, Venous: 3.6 mmol/L (ref 0.5–1.9)
Lactic Acid, Venous: 3.2 mmol/L (ref 0.5–1.9)
Lactic Acid, Venous: 3.2 mmol/L (ref 0.5–1.9)
Lactic Acid, Venous: 1.9 mmol/L (ref 0.5–1.9)

## 2023-11-21 LAB — PHOSPHORUS
Phosphorus: 2.9 mg/dL (ref 2.5–4.6)
Phosphorus: 3.8 mg/dL (ref 2.5–4.6)
Phosphorus: 4 mg/dL (ref 2.5–4.6)

## 2023-11-21 LAB — OSMOLALITY, URINE: Osmolality, Ur: 538 mosm/kg (ref 300–900)

## 2023-11-21 LAB — SODIUM, URINE, RANDOM: Sodium, Ur: 12 mmol/L

## 2023-11-21 LAB — SURGICAL PCR SCREEN
MRSA, PCR: NEGATIVE
Staphylococcus aureus: NEGATIVE

## 2023-11-21 LAB — TROPONIN I (HIGH SENSITIVITY)
Troponin I (High Sensitivity): 133 ng/L (ref <18)
Troponin I (High Sensitivity): 134 ng/L (ref <18)

## 2023-11-21 LAB — CREATININE, URINE, RANDOM: Creatinine, Urine: 47 mg/dL

## 2023-11-21 LAB — T4, FREE: Free T4: 1 ng/dL (ref 0.61–1.12)

## 2023-11-21 LAB — CBG MONITORING, ED
Glucose-Capillary: 103 mg/dL — ABNORMAL HIGH (ref 70–99)
Glucose-Capillary: 97 mg/dL (ref 70–99)

## 2023-11-21 LAB — PREALBUMIN: Prealbumin: 8 mg/dL — ABNORMAL LOW (ref 18–38)

## 2023-11-21 MED ORDER — CHLORHEXIDINE GLUCONATE CLOTH 2 % EX PADS
6.0000 | MEDICATED_PAD | Freq: Every day | CUTANEOUS | Status: DC
  Administered 2023-11-21 – 2023-12-11 (×21): 6 via TOPICAL

## 2023-11-21 MED ORDER — VITAL HIGH PROTEIN PO LIQD: 1000.0000 mL | ORAL | Status: DC

## 2023-11-21 MED ORDER — LACTATED RINGERS IV BOLUS
1000.0000 mL | INTRAVENOUS | Status: AC
  Administered 2023-11-21: 1000 mL via INTRAVENOUS

## 2023-11-21 MED ORDER — JEVITY 1.5 CAL/FIBER PO LIQD
1000.0000 mL | ORAL | Status: DC
  Administered 2023-11-21 – 2023-12-03 (×10): 1000 mL
  Filled 2023-11-21 (×26): qty 1000

## 2023-11-21 NOTE — Progress Notes (Addendum)
 Sepsis-in the setting of aspiration pneumonia - Lactic acid level has been improved 7.4-3.6.  Giving a second liter of LR bolus.  Currently on maintenance fluid D5W 100 cc/h and also receiving free water  flush 300 mL every 6 hours.  Patient also has hypernatremia so has to be careful to prevent overcorrection of sodium level as well however it is a difficult situation given also has  lactic acidosis in the setting of sepsis. -Currently empirically covering with IV vancomycin  and cefepime  for the management of aspiration pneumonia. -CT abdomen pelvis no evidence of bowel ischemia.

## 2023-11-21 NOTE — Assessment & Plan Note (Signed)
-  Continue with home Synthroid 

## 2023-11-21 NOTE — Assessment & Plan Note (Signed)
 With a flat curve, likely due to demand ischemia. Recent echo done in March 2025 with grade 1 diastolic dysfunction and no regional wall motion abnormalities. Unable to explain any chest pain.

## 2023-11-21 NOTE — Hospital Course (Addendum)
 Taken from H&P.  [name] [name] is a 89 y.o. male with medical history significant of dementia dysphagia status post PEG tube, hypertension hyperlipidemia hypothyroidism gout BPH presented with  fevers chills dysuria increased increased frequency in urination.  Patient is bedbound at baseline and moans throughout the day.  On presentation mild tachycardia and tachypnea, labs with neutrophil predominant leukocytosis at 16.1, sodium of 160, potassium 5.7, BUN 112, creatinine 2.08, albumin  2.4, lactic acid of 7.4, troponin 149, TSH mildly elevated at 6.870, procalcitonin 2.50, UA negative for UTI, respiratory panel negative, chest x-ray with patchy left sided groundglass airspace disease although improved from prior x-ray consistent with residual or recurrent pneumonia versus aspiration.  Patient received IV fluid and started on broad-spectrum antibiotics with concern of severe sepsis secondary to aspiration pneumonia.  5/27: Vitals stable except mild tachypnea, labs with resolution of lactic acidosis, sodium at 154, hyperkalemia resolved with potassium at 5, some improvement in leukocytosis at 14.5 but all cell lines decreased, creatinine improved 1.69.  Cultures pending.  Free water  deficit of 3 L-continuing 5% dextrose .  Patient with history of MRSA and Pseudomonas pneumonia-continuing cefepime  and vancomycin  and discontinue Flagyl .  Patient met the criteria for severe sepsis, septic shock ruled out as there was no persistent hypotension.  Likely severe dehydration with volume contraction.  Recurrent hospitalizations for similar reasons and severe dehydration.  Family need to be educated more, also consulting palliative care.  They do not want him to go to SNF.  Patient has severe malnutrition, advanced dementia and completely dependent for all ADL.  High risk for mortality, currently full code-palliative care was consulted.  I tried discussing CODE STATUS with son but he wants him to be full scope of care,  tried my best to explain and reason, he said he will try talking with his family.

## 2023-11-21 NOTE — Progress Notes (Signed)
 Progress Note   Patient: [name] [name] JXB:[id number] DOB: July 01, 1933 DOA: 11/20/2023     1 DOS: the patient was seen and examined on 11/21/2023   Brief hospital course: Taken from H&P.  [name] [name] is a 89 y.o. male with medical history significant of dementia dysphagia status post PEG tube, hypertension hyperlipidemia hypothyroidism gout BPH presented with  fevers chills dysuria increased increased frequency in urination.  Patient is bedbound at baseline and moans throughout the day.  On presentation mild tachycardia and tachypnea, labs with neutrophil predominant leukocytosis at 16.1, sodium of 160, potassium 5.7, BUN 112, creatinine 2.08, albumin  2.4, lactic acid of 7.4, troponin 149, TSH mildly elevated at 6.870, procalcitonin 2.50, UA negative for UTI, respiratory panel negative, chest x-ray with patchy left sided groundglass airspace disease although improved from prior x-ray consistent with residual or recurrent pneumonia versus aspiration.  Patient received IV fluid and started on broad-spectrum antibiotics with concern of severe sepsis secondary to aspiration pneumonia.  5/27: Vitals stable except mild tachypnea, labs with resolution of lactic acidosis, sodium at 154, hyperkalemia resolved with potassium at 5, some improvement in leukocytosis at 14.5 but all cell lines decreased, creatinine improved 1.69.  Cultures pending.  Free water  deficit of 3 L-continuing 5% dextrose .  Patient with history of MRSA and Pseudomonas pneumonia-continuing cefepime  and vancomycin  and discontinue Flagyl .  Patient met the criteria for severe sepsis, septic shock ruled out as there was no persistent hypotension.  Likely severe dehydration with volume contraction.  Recurrent hospitalizations for similar reasons and severe dehydration.  Family need to be educated more, also consulting palliative care.  They do not want him to go to SNF.  Patient has severe malnutrition, advanced dementia and completely dependent for  all ADL.  High risk for mortality, currently full code-palliative care was consulted.  I tried discussing CODE STATUS with son but he wants him to be full scope of care, tried my best to explain and reason, he said he will try talking with his family.  Assessment and Plan: * Severe sepsis [facility]) Patient met severe sepsis criteria with leukocytosis, tachycardia and tachypnea.  Significant lactic acidosis but no hypotension. Lactic acidosis quickly resolved with IV fluid. Septic shock ruled out Likely due to aspiration pneumonia although imaging with some improvement of prior infiltrate but patient is high risk for aspiration, elevated procalcitonin, preliminary blood cultures negative. Patient with history of MRSA and Pseudomonas pneumonia. - Continue with cefepime  and vancomycin  -Stop Flagyl  -Continue with supportive care  AKI (acute kidney injury) (HCC) Likely due to severe dehydration, creatinine started improving with IV fluid, currently at 1.69 with baseline around 1. -Continue with IV fluid - Monitor renal function -Avoid nephrotoxins  Hypernatremia Improving sodium but remained elevated at 154, water  deficit of about 3 L today. -Continue IV fluid and free water  with PEG -Monitor sodium  Dehydration Multiple hospitalization for similar reason.  Presentation consistent with significant dehydration. - Need to further family education as patient is completely dependent  Failure to thrive in adult Patient with severe protein caloric malnutrition s/p PEG tube placement but not sure if he was getting nutrition according to the plan. - Palliative care was consulted  Protein-calorie malnutrition, severe Dietitian is on board for PEG tube feeding  Dysphagia S/p PEG tube placement due to severe dysphagia and recurrent aspiration. -Continue PEG tube feeding  Hyperkalemia Resolved with IV fluid. - Continue to monitor  Elevated troponin With a flat curve, likely due to demand  ischemia. Recent echo done in March 2025  with grade 1 diastolic dysfunction and no regional wall motion abnormalities. Unable to explain any chest pain.  Essential hypertension Blood pressure within goal. -Holding home amlodipine  for the concern of sepsis. - Continue to monitor and restart home amlodipine  as appropriate  Anemia, chronic disease Hemoglobin at 9.6 today which is better than his baseline.  Initial hemoglobin of 10.7 likely due to hemoconcentration -Monitor hemoglobin -Transfuse below 7  Hypothyroidism - Continue with home Synthroid       Subjective: Severely malnourished gentleman, unable to follow any commands with intermittent Holling.  Physical Exam: Vitals:   11/21/23 0916 11/21/23 0930 11/21/23 0953 11/21/23 1200  BP:  114/69  118/61  Pulse:  91 98 94  Resp:  (!) 29 19 (!) 28  Temp: 98.5 F (36.9 C)   98.4 F (36.9 C)  TempSrc:      SpO2:  100% 100% 97%  Weight:      Height:       General.  Frail and severely malnourished elderly man, in no acute distress. Pulmonary.  Lungs clear bilaterally, normal respiratory effort. CV.  Regular rate and rhythm, no JVD, rub or murmur. Abdomen.  Soft, nontender, nondistended, BS positive. CNS.  Awake but unable to participate with any care Extremities.  No edema, no cyanosis, pulses intact and symmetrical. Psychiatry.  Judgment and insight appears impaired.  Data Reviewed: Prior data reviewed  Family Communication: Talked with son and step daughter on phone.  Disposition: Status is: Inpatient Remains inpatient appropriate because: Severity of illness.  Planned Discharge Destination: Home  Time spent: 50 minutes  This record has been created using Conservation officer, historic buildings. Errors have been sought and corrected,but may not always be located. Such creation errors do not reflect on the standard of care.   Author: Luna Salinas, MD 11/21/2023 2:19 PM  For on call review [email or web address].

## 2023-11-21 NOTE — Assessment & Plan Note (Signed)
 Patient with severe protein caloric malnutrition s/p PEG tube placement but not sure if he was getting nutrition according to the plan. - Palliative care was consulted

## 2023-11-21 NOTE — Consult Note (Signed)
 VAT: Consult for PIV   No suitable veins for venipuncture. CVC is recommended at this time.

## 2023-11-21 NOTE — Progress Notes (Addendum)
 Brief Nutrition Note  Consult received for enteral/tube feeding initiation and management.  Adult Enteral Nutrition Protocol initiated. Full assessment to follow.  Case discussed with RN and MD. Pt is in the ED and requesting to resume TF. Noted pt has been admitted with recurrent pneumonia. Palliative care has been consulted for goals of care.   Per prior RD notes, home TF is as follows:   6 cartons Jevity 1.5 daily (1.5 cartons at 8AM, 12PM, 4PM, 8PM) Free water  q6h per MD at 9AM, 1PM,  5PM, and 9PM ( daily)    Provides 2130 kcal, 91g protein and free water  daily (TF + FWF)  Given pneumonia and possible refeeding risk, will start continuous TF:   Initiate Jevity 1.5 @ 20 ml/hr and increase by 10 ml every 8 hours to goal rate of 60 ml/hr.   300 ml free water  flush every 6 hours  Tube feeding regimen provides 2160 kcal (100% of needs), 92 grams of protein, and 1094 ml of H2O. Total free water : 2294 ml daily.   Noted order for 100 mg thiamine  IV daily; pt is at high refeeding risk.   G tube in place with tip located in stomach per CT scan today.   Admitting Dx: Septic shock (HCC) [A41.9, R65.21]  Body mass index is 17.94 kg/m. Pt meets criteria for underweight based on current BMI.  Labs:  Recent Labs  Lab 11/20/23 1945 11/20/23 2119 11/20/23 2151 11/21/23 0032 11/21/23 0300  NA 156*  --  160* 156* 154*  K 5.7*  --  5.3* 4.7 5.0  CL 123*  --   --  125* 120*  CO2 21*  --   --  21* 21*  BUN 112*  --   --  110* 104*  CREATININE 2.08*  --   --  1.89* 1.69*  CALCIUM 10.2  --   --  9.6 9.7  MG  --  2.7*  --   --  2.5*  PHOS  --  3.3  --   --  2.9  GLUCOSE 159*  --   --  165* 101*    Raniya Golembeski W, RD, LDN, CDCES Registered Dietitian III Certified Diabetes Care and Education Specialist If unable to reach this RD, please use "RD Inpatient" group chat on secure chat between hours of 8am-4 pm daily

## 2023-11-21 NOTE — Assessment & Plan Note (Signed)
 Hemoglobin at 9.6 today which is better than his baseline.  Initial hemoglobin of 10.7 likely due to hemoconcentration -Monitor hemoglobin -Transfuse below 7

## 2023-11-21 NOTE — Assessment & Plan Note (Signed)
 Multiple hospitalization for similar reason.  Presentation consistent with significant dehydration. - Need to further family education as patient is completely dependent

## 2023-11-21 NOTE — Assessment & Plan Note (Signed)
 S/p PEG tube placement due to severe dysphagia and recurrent aspiration. -Continue PEG tube feeding

## 2023-11-21 NOTE — Consult Note (Incomplete)
 Palliative Care Consult Note                                  Date: 11/21/2023   Patient Name: [name] [name]  DOB: [birth year]  MRN: [id number]  Age / Sex: 89 y.o., male  PCP: Lawrance Presume, MD Referring Physician: Luna Salinas, MD  Reason for Consultation: Establishing goals of care  HPI/Patient Profile: 89 y.o. male  with past medical history of dementia, dysphagia status post PEG tube, hypertension, hypothyroidism chronic CHF, BPH, and gout who presented to the ED on 11/20/2023 with fever, chills, urinary frequency, and concern for possible UTI.  In the ED, chest x-ray shows patchy left-sided groundglass airspace disease, improved since prior x-ray, consistent with residual or recurrent pneumonia versus aspiration. Initial lactic acid was 7.4.   Patient is admitted with septic shock secondary to aspiration pneumonia, AKI, lactic acidosis, and hypernatremia.  Palliative Medicine is consulted for goals of care and medical decision.    Subjective:   Extensive chart review has been completed including labs, vital signs, imaging, progress/consult notes, orders, medications and available advance directive documents.    Patient initially assessed at bedside in the ED. He is awake and continuously moaning. He does acknowledge my presence by tracking and seems to be attempting to communicate, but his speech is garbled. Bilateral mittens in place.   I later met with his daughter-in-law [name] [name] at bedside on on 2C to discuss diagnosis, prognosis, and GOC.  Patient is known to PMT from previous hospitalizations. I re-introduced Palliative Medicine as specialized medical care for people living with serious illness.   Created space and opportunity for family to express thoughts and feelings regarding current medical situation. Values and goals of care were attempted to be elicited.  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 He and his wife have been married for 65 years. He is of Muslim faith.   Functional Status: Patient and his wife live at home with their son/[name] [name], daughter-in-law/[name] [name], and their 7 children. At baseline, he is bed-bound and requires total care. He has a PEG for all nutrition and medications.   GOC Discussion: We discussed patient's current illness and what it means in the larger context of his ongoing co-morbidities. Current clinical status was reviewed. Discussed that patient is admitted with aspiration pneumonia, sepsis, AKI, and hypernatremia.   Offered education that dementia is a progressive, non-curable disease underlying the patient's current acute medical conditions. I shared my concern that patient had reached end stages. Discussed that aspiration pneumonia will be a recurrent issue.   Discussed code status and scopes of care. Encouraged DNR/DNI status understanding evidenced-based poor outcomes in similar hospitalized patients, and to protect patient from trauma during his last moments of life.   [name] [name] indicates that family remains open to all offered and available medical interventions to prolong life. She states they want to "do everything until the end".  I offered education on the limitations of medical interventions to prolong quality of life when the body fails to thrive. I also shared my concern that patient seems to have significant discomfort; [name] [name] agrees. She becomes tearful during our conversation, reflecting that patient has been like a father to her (as her own father died with she was 89 years old). She shares that her husband/[name] [name] is struggling to accept that patient  is approaching end-of-life. She plans to speak with him, and is open to ongoing GOC discussions.    Review of Systems  Unable to perform ROS   Objective:   Primary Diagnoses: Present on Admission:  AKI (acute  kidney injury) (HCC)  Aspiration pneumonia (HCC)  Septic shock (HCC)  Anemia, chronic disease  Dehydration  Elevated troponin  Essential hypertension  Failure to thrive in adult  Hyperkalemia  Protein-calorie malnutrition, severe  Hypernatremia  Hypothyroidism   Physical Exam Constitutional:      Appearance: He is cachectic. He is ill-appearing.  Cardiovascular:     Rate and Rhythm: Normal rate.  Pulmonary:     Effort: Tachypnea present. No respiratory distress.     Comments: On room air Congested cough Neurological:     Mental Status: He is alert.  Psychiatric:        Speech: He is noncommunicative.    Palliative Assessment/Data: PPS 20%     Assessment & Plan:   SUMMARY OF RECOMMENDATIONS   Continue full scope care Goal of care is medical stabilization Ongoing GOC discussions  Primary Decision Maker: NEXT OF KIN - son/[name] [name]  Code Status/Advance Care Planning: Full code  Symptom Management:  Per attending  Prognosis:  Overall poor  Discharge Planning:  To Be Determined    Thank you for allowing us  to participate in the care of [name] [name]   Time Total: 80 minutes  Detailed review of medical records (labs, imaging, vital signs), medically appropriate exam, discussed with treatment team, counseling and education to patient, family, & staff, documenting clinical information, coordination of care.   Signed by: Maisie Scotland, NP Palliative Medicine Team  Team Phone # [phone]  For individual providers, please see AMION

## 2023-11-21 NOTE — Assessment & Plan Note (Signed)
 Improving sodium but remained elevated at 154, water  deficit of about 3 L today. -Continue IV fluid and free water  with PEG -Monitor sodium

## 2023-11-21 NOTE — Plan of Care (Signed)
  Problem: Respiratory: Goal: Ability to maintain adequate ventilation will improve Outcome: Progressing   Problem: Coping: Goal: Level of anxiety will decrease Outcome: Progressing   Problem: Elimination: Goal: Will not experience complications related to bowel motility Outcome: Progressing Goal: Will not experience complications related to urinary retention Outcome: Progressing

## 2023-11-21 NOTE — Assessment & Plan Note (Signed)
 Patient met severe sepsis criteria with leukocytosis, tachycardia and tachypnea.  Significant lactic acidosis but no hypotension. Lactic acidosis quickly resolved with IV fluid. Septic shock ruled out Likely due to aspiration pneumonia although imaging with some improvement of prior infiltrate but patient is high risk for aspiration, elevated procalcitonin, preliminary blood cultures negative. Patient with history of MRSA and Pseudomonas pneumonia. - Continue with cefepime  and vancomycin  -Stop Flagyl  -Continue with supportive care

## 2023-11-21 NOTE — Assessment & Plan Note (Signed)
Resolved with IV fluid. -Continue to monitor

## 2023-11-21 NOTE — Assessment & Plan Note (Signed)
 Blood pressure within goal. -Holding home amlodipine  for the concern of sepsis. - Continue to monitor and restart home amlodipine  as appropriate

## 2023-11-21 NOTE — Assessment & Plan Note (Signed)
 Likely due to severe dehydration, creatinine started improving with IV fluid, currently at 1.69 with baseline around 1. -Continue with IV fluid - Monitor renal function -Avoid nephrotoxins

## 2023-11-21 NOTE — ED Notes (Signed)
 Palliative care at bedside.

## 2023-11-21 NOTE — Sepsis Progress Note (Signed)
 Notified bedside nurse of need to draw repeat lactic acid.

## 2023-11-21 NOTE — Assessment & Plan Note (Signed)
 Dietitian is on board for PEG tube feeding

## 2023-11-22 ENCOUNTER — Other Ambulatory Visit: Payer: Self-pay

## 2023-11-22 ENCOUNTER — Encounter (HOSPITAL_COMMUNITY): Payer: Self-pay | Admitting: Internal Medicine

## 2023-11-22 DIAGNOSIS — R6520 Severe sepsis without septic shock: Secondary | ICD-10-CM

## 2023-11-22 LAB — CBC
HCT: 28.1 % — ABNORMAL LOW (ref 39.0–52.0)
Hemoglobin: 8.4 g/dL — ABNORMAL LOW (ref 13.0–17.0)
MCH: 26.5 pg (ref 26.0–34.0)
MCHC: 29.9 g/dL — ABNORMAL LOW (ref 30.0–36.0)
MCV: 88.6 fL (ref 80.0–100.0)
Platelets: 150 10*3/uL (ref 150–400)
RBC: 3.17 MIL/uL — ABNORMAL LOW (ref 4.22–5.81)
RDW: 18.5 % — ABNORMAL HIGH (ref 11.5–15.5)
WBC: 7.6 10*3/uL (ref 4.0–10.5)
nRBC: 0 % (ref 0.0–0.2)

## 2023-11-22 LAB — GLUCOSE, CAPILLARY
Glucose-Capillary: 106 mg/dL — ABNORMAL HIGH (ref 70–99)
Glucose-Capillary: 129 mg/dL — ABNORMAL HIGH (ref 70–99)
Glucose-Capillary: 93 mg/dL (ref 70–99)
Glucose-Capillary: 94 mg/dL (ref 70–99)
Glucose-Capillary: 95 mg/dL (ref 70–99)
Glucose-Capillary: 98 mg/dL (ref 70–99)

## 2023-11-22 LAB — BASIC METABOLIC PANEL WITH GFR
Anion gap: 5 (ref 5–15)
BUN: 83 mg/dL — ABNORMAL HIGH (ref 8–23)
CO2: 23 mmol/L (ref 22–32)
Calcium: 9.5 mg/dL (ref 8.9–10.3)
Chloride: 121 mmol/L — ABNORMAL HIGH (ref 98–111)
Creatinine, Ser: 1.61 mg/dL — ABNORMAL HIGH (ref 0.61–1.24)
GFR, Estimated: 41 mL/min — ABNORMAL LOW (ref >60)
Glucose, Bld: 103 mg/dL — ABNORMAL HIGH (ref 70–99)
Potassium: 4.8 mmol/L (ref 3.5–5.1)
Sodium: 149 mmol/L — ABNORMAL HIGH (ref 135–145)

## 2023-11-22 LAB — PHOSPHORUS: Phosphorus: 4.3 mg/dL (ref 2.5–4.6)

## 2023-11-22 MED ORDER — SODIUM CHLORIDE 0.9% FLUSH
10.0000 mL | Freq: Two times a day (BID) | INTRAVENOUS | Status: DC
  Administered 2023-11-22 – 2023-11-25 (×6): 10 mL
  Administered 2023-11-25: 20 mL
  Administered 2023-11-26 – 2023-11-28 (×6): 10 mL
  Administered 2023-11-29: 30 mL
  Administered 2023-11-29 – 2023-11-30 (×2): 10 mL
  Administered 2023-11-30: 20 mL
  Administered 2023-12-01: 10 mL
  Administered 2023-12-01: 20 mL
  Administered 2023-12-02 – 2023-12-05 (×7): 10 mL
  Administered 2023-12-06: 20 mL
  Administered 2023-12-06: 10 mL
  Administered 2023-12-07: 20 mL
  Administered 2023-12-07 – 2023-12-09 (×3): 10 mL
  Administered 2023-12-10: 30 mL
  Administered 2023-12-10: 10 mL

## 2023-11-22 MED ORDER — DEXTROSE 5 % IV SOLN: INTRAVENOUS | Status: DC

## 2023-11-22 MED ORDER — FREE WATER
200.0000 mL | Freq: Four times a day (QID) | Status: DC
  Administered 2023-11-22 – 2023-11-30 (×29): 200 mL

## 2023-11-22 MED ORDER — SODIUM CHLORIDE 0.9% FLUSH
10.0000 mL | INTRAVENOUS | Status: DC | PRN
  Administered 2023-11-22: 10 mL

## 2023-11-22 NOTE — Plan of Care (Signed)
 ?  Problem: Clinical Measurements: ?Goal: Will remain free from infection ?Outcome: Progressing ?  ?

## 2023-11-22 NOTE — TOC Initial Note (Signed)
 Transition of Care [facility]) - Initial/Assessment Note    Patient Details  Name: [name] [name] MRN: [id number] Date of Birth: 08-12-1933  Transition of Care [name]) CM/SW Contact:    Jeani Mill, RN Phone Number: 11/22/2023, 2:50 PM  Clinical Narrative:      Patient lives with son and is followed by [facility].  Patient has a year supply of tube feeds.  Medicaid is pending.  Palliative consult.  TOC will follow for needs.    Expected Discharge Plan: Home/Self Care Barriers to Discharge: Continued Medical Work up   Patient Goals and CMS Choice            Expected Discharge Plan and Services       Living arrangements for the past 2 months: Single Family Home                                      Prior Living Arrangements/Services Living arrangements for the past 2 months: Single Family Home Lives with:: Adult Children Patient language and need for interpreter reviewed:: Yes        Need for Family Participation in Patient Care: Yes (Comment) Care giver support system in place?: Yes (comment) Current home services: DME Criminal Activity/Legal Involvement Pertinent to Current Situation/Hospitalization: No - Comment as needed  Activities of Daily Living   ADL Screening (condition at time of admission) Independently performs ADLs?: No Does the patient have a NEW difficulty with bathing/dressing/toileting/self-feeding that is expected to last >3 days?: No Does the patient have a NEW difficulty with getting in/out of bed, walking, or climbing stairs that is expected to last >3 days?: No Does the patient have a NEW difficulty with communication that is expected to last >3 days?: No Is the patient deaf or have difficulty hearing?: Yes (per prior screening) Does the patient have difficulty seeing, even when wearing glasses/contacts?: No (per prior screening) Does the patient have difficulty concentrating, remembering, or making decisions?: Yes (per prior  screening)  Permission Sought/Granted                  Emotional Assessment         Alcohol / Substance Use: Not Applicable Psych Involvement: No (comment)  Admission diagnosis:  Dehydration [E86.0] Hyperkalemia [E87.5] Hypernatremia [E87.0] Recurrent pneumonia [J18.9] AKI (acute kidney injury) (HCC) [N17.9] Septic shock (HCC) [A41.9, R65.21] Patient Active Problem List   Diagnosis Date Noted   Severe sepsis (HCC) 11/20/2023   Hypernatremia 11/20/2023   Sepsis (HCC) 10/06/2023   Decubitus skin ulcer 09/21/2023   Multifocal pneumonia 09/20/2023   Fall at home, initial encounter 09/20/2023   Failure to thrive in adult 09/20/2023   Dehydration 09/20/2023   Dementia (HCC) 09/20/2023   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   COVID-19 04/17/2023   Intractable nausea and vomiting 04/16/2023   COVID-19 virus infection 04/16/2023   SVT (supraventricular tachycardia) (HCC) 04/16/2023   Protein-calorie malnutrition, severe 04/08/2022   Dysphagia 04/02/2022   Aspiration pneumonia (HCC) 04/02/2022   Hyponatremia 04/02/2022   Elevated troponin 04/02/2022   AKI (acute kidney injury) (HCC) 04/02/2022   Hyperkalemia 12/07/2021   Edema 12/07/2021   Venous insufficiency of both lower extremities 12/07/2021   Eczema 12/07/2021   Hx of gout 01/24/2017   Hypothyroidism 10/30/2008   Anemia, chronic disease 09/18/2008   Essential hypertension 09/18/2008   BENIGN PROSTATIC HYPERTROPHY, WITH URINARY OBSTRUCTION 09/18/2008   Backache 09/18/2008  PCP:  Lawrance Presume, MD Pharmacy:   [facility] [address] [place] [address] Phone: [phone] Fax: [phone]  [facility] [address] [place] [address] Phone: [phone] Fax: [phone]  [id number] [place], [place] - [address] AT [name] [address] [place] [address] Phone: [phone] Fax: [phone]     Social Drivers of Health (SDOH) Social History: SDOH Screenings   Food Insecurity: Patient Unable To Answer (11/21/2023)  Housing: Patient Unable To Answer (11/21/2023)  Transportation Needs: Patient Unable To Answer (11/21/2023)  Utilities: Patient Unable To Answer (11/21/2023)  Depression (PHQ2-9): Medium Risk (03/10/2022)  Social Connections: Patient Unable To Answer (11/21/2023)  Tobacco Use: Low Risk  (11/22/2023)   SDOH Interventions:     Readmission Risk Interventions    10/09/2023    2:45 PM  Readmission Risk Prevention Plan  Transportation Screening Complete  PCP or Specialist Appt within 3-5 Days Complete  HRI or Home Care Consult Complete  Social Work Consult for Recovery Care Planning/Counseling Complete  Palliative Care Screening Not Applicable  Medication Review Oceanographer) Complete

## 2023-11-22 NOTE — Progress Notes (Signed)
 Peripherally Inserted Central Catheter Placement  The IV Nurse has discussed with the patient and/or persons authorized to consent for the patient, the purpose of this procedure and the potential benefits and risks involved with this procedure.  The benefits include less needle sticks, lab draws from the catheter, and the patient may be discharged home with the catheter. Risks include, but not limited to, infection, bleeding, blood clot (thrombus formation), and puncture of an artery; nerve damage and irregular heartbeat and possibility to perform a PICC exchange if needed/ordered by physician.  Alternatives to this procedure were also discussed.  Bard Power PICC patient education guide, fact sheet on infection prevention and patient information card has been provided to patient /or left at bedside.    PICC Placement Documentation  PICC Double Lumen 11/22/23 Left Basilic 41 cm 0 cm (Active)  Indication for Insertion or Continuance of Line Limited venous access - need for IV therapy >5 days (PICC only) 11/22/23 1505  Exposed Catheter (cm) 0 cm 11/22/23 1505  Site Assessment Clean, Dry, Intact 11/22/23 1505  Lumen #1 Status Flushed;Saline locked;Blood return noted 11/22/23 1505  Lumen #2 Status Saline locked;Flushed;Blood return noted 11/22/23 1505  Dressing Type Transparent;Securing device 11/22/23 1505  Dressing Status Antimicrobial disc/dressing in place;Clean, Dry, Intact 11/22/23 1505  Line Adjustment (NICU/IV Team Only) No 11/22/23 1505  Dressing Intervention Adhesive placed at insertion site (IV team only) 11/22/23 1505  Dressing Change Due 11/29/23 11/22/23 1505       Daphney Eans 11/22/2023, 3:07 PM

## 2023-11-22 NOTE — Evaluation (Signed)
 Clinical/Bedside Swallow Evaluation Patient Details  Name: Zahmir Lalla MRN: [id number] Date of Birth: [birth year]  Today's Date: 11/22/2023 Time: SLP Start Time (ACUTE ONLY): 1040 SLP Stop Time (ACUTE ONLY): 1050 SLP Time Calculation (min) (ACUTE ONLY): 10 min  Past Medical History:  Past Medical History:  Diagnosis Date   Back pain 10/2008   per MRI 11/06/2008 - severe spinal stenosis worse at L4-5, L3-4, with neural compression at those levels   BPH (benign prostatic hyperplasia)    Eczema    Gout    Hypertension    Hypothyroidism    Past Surgical History:  Past Surgical History:  Procedure Laterality Date   ESOPHAGOGASTRODUODENOSCOPY N/A 04/03/2022   Procedure: ESOPHAGOGASTRODUODENOSCOPY (EGD);  Surgeon: Alvis Jourdain, MD;  Location: [facility] ENDOSCOPY;  Service: Gastroenterology;  Laterality: N/A;   IR REPLC GASTRO/COLONIC TUBE PERCUT W/FLUORO  09/29/2023   LAMINECTOMY  12/11/2008    Bilateral L2, L3, and L4 laminotomies, done by Dr. Larrie Po   LAPAROSCOPY N/A 04/06/2022   Procedure: LAPAROSCOPIC ASSISTED PERCUTANEOUS ENDOSCOPIC GASTROSTOMY PLACEMENT;  Surgeon: Dareen Ebbing, MD;  Location: [facility] OR;  Service: General;  Laterality: N/A;   HPI:  Patient is an 89 y.o. male with PMH: dementia, bedbound at baseline, dysphagia (h/o aspiration PNA), s/p PEG placement, gout, BPH. He presented to the hospital on 11/20/23 with possible UTI with increased frequency in urination. SLP has evaluated him when he was admitted in March and April of 2025 and continuing NPO status was recommended both times.    Assessment / Plan / Recommendation  Clinical Impression  Patient continues with clinical s/s of dysphagia as per this BSE,, which is very similar to BSE's completed last two admissions (March and April of 2025). He exhibited limited to no manipulation of honey thick liquid bolus presented on spoon. This resulted in anterior spillage of bolus and delayed cough. SLP provided oral care with [place] suction  after PO attempt. Patient continues to not be safe for PO's and does not show potential for rehabilitation of swallow function. SLP Visit Diagnosis: Dysphagia, oropharyngeal phase (R13.12)    Aspiration Risk  Severe aspiration risk;Risk for inadequate nutrition/hydration    Diet Recommendation NPO    Medication Administration: Via alternative means    Other  Recommendations Oral Care Recommendations: Oral care QID;Staff/trained caregiver to provide oral care    Recommendations for follow up therapy are one component of a multi-disciplinary discharge planning process, led by the attending physician.  Recommendations may be updated based on patient status, additional functional criteria and insurance authorization.  Follow up Recommendations No SLP follow up      Assistance Recommended at Discharge    Functional Status Assessment Patient has had a recent decline in their functional status and/or demonstrates limited ability to make significant improvements in function in a reasonable and predictable amount of time  Frequency and Duration            Prognosis   N/A     Swallow Study   General Date of Onset: 11/21/23 HPI: Patient is an 89 y.o. male with PMH: dementia, bedbound at baseline, dysphagia (h/o aspiration PNA), s/p PEG placement, gout, BPH. He presented to the hospital on 11/20/23 with possible UTI with increased frequency in urination. SLP has evaluated him when he was admitted in March and April of 2025 and continuing NPO status was recommended both times. Previous Swallow Assessment: MBSS 03/2022; BSE 09/21/23 Rx NPO, BSE 10/07/23 rec NPO Diet Prior to this Study: NPO Temperature Spikes Noted: Yes (99.4) Respiratory  Status: Nasal cannula History of Recent Intubation: No Behavior/Cognition: Alert;Cooperative;Pleasant mood;Confused Oral Cavity Assessment: Other (comment) (NT had just finished oral care with patient prior to SLP arrival into room and they reported it was  significantly dry with dried secretions) Oral Care Completed by SLP: Yes Self-Feeding Abilities: Total assist Patient Positioning: Upright in bed Baseline Vocal Quality: Low vocal intensity Volitional Cough: Cognitively unable to elicit Volitional Swallow: Unable to elicit    Oral/Motor/Sensory Function Overall Oral Motor/Sensory Function: Other (comment) (UTA)   Ice Chips Ice chips: Not tested   Thin Liquid Thin Liquid: Not tested    Nectar Thick Nectar Thick Liquid: Not tested   Honey Thick Honey Thick Liquid: Impaired Presentation: Spoon Oral Phase Impairments: Reduced labial seal;Poor awareness of bolus;Reduced lingual movement/coordination Oral Phase Functional Implications: Oral holding;Other (comment);Left anterior spillage;Right anterior spillage Pharyngeal Phase Impairments: Other (comments);Cough - Delayed Other Comments: no swallow initiated   Puree Puree: Not tested   Solid     Solid: Not tested      Jacqualine Mater, MA, CCC-SLP Speech Therapy

## 2023-11-22 NOTE — Plan of Care (Signed)
   Problem: Fluid Volume: Goal: Hemodynamic stability will improve Outcome: Progressing   Problem: Clinical Measurements: Goal: Diagnostic test results will improve Outcome: Progressing Goal: Signs and symptoms of infection will decrease Outcome: Progressing   Problem: Respiratory: Goal: Ability to maintain adequate ventilation will improve Outcome: Progressing

## 2023-11-22 NOTE — Progress Notes (Signed)
 PROGRESS NOTE    [name] [name]  RUE:[id number] DOB: 1933-12-26 DOA: 11/20/2023 PCP: Lawrance Presume, MD   Brief Narrative:  89 y.o. male with medical history significant of dementia, dysphagia status post PEG tube, hypertension, hyperlipidemia, hypothyroidism, gout, BPH presented with  fevers chills dysuria increased increased frequency in urination.  At baseline, patient is bedbound and moves throughout the day.  On presentation, he was mildly tachycardic and tachypneic with WBCs of 16.1, sodium of 160, potassium 5.7, creatinine 2.08, lactic acid of 7.4, troponin 149, procalcitonin 2.5.  UA negative for UTI.  Influenza/COVID/RSV PCR negative.  Chest x-ray consistent with residual or recurrent pneumonia versus aspiration.  He was started on broad-spectrum antibiotics.  Palliative care consulted.  Assessment & Plan:   Severe sepsis: Present on admission Possible aspiration pneumonia -Presented with leukocytosis, tachycardia, tachypnea, lactic acidosis possibly from aspiration pneumonia.  Patient is bedbound and has history of recurrent aspirations. - Continue cefepime .  Cultures negative so far.  DC vancomycin .  Acute kidney injury Dehydration Improving.  Creatinine 1.61 today.  Monitor  Hypernatremia - Sodium 160 on presentation.  Improving to 149 today.  Continue free water  via tube.  Will restart IV fluids today.  Failure to thrive in adult Goals of care Severe protein calorie malnutrition Dysphagia status post PEG tube placement History of dementia -Continue PEG tube feeding.  Patient possibly has reached to end of his life and prognosis is very poor.  Palliative care consulted for goals of care discussion.  Patient remains full code.  Elevated troponin - Did not trend upwards.  Possibly from demand ischemia.  No further workup needed  Hyperkalemia Resolved with IV fluids  Leukocytosis Resolved  Anemia of chronic disease - From chronic illnesses.  Hemoglobin currently stable.   Monitor intermittently  Hypothyroidism Continue Synthroid   Essential hypertension - Blood pressure stable.  Amlodipine  on hold    DVT prophylaxis: SCDs Code Status: Full Family Communication: None at bedside Disposition Plan: Status is: Inpatient Remains inpatient appropriate because: Of severity of illness    Consultants: Palliative care  Procedures: None  Antimicrobials:  Anti-infectives (From admission, onward)    Start     Dose/Rate Route Frequency Ordered Stop   11/21/23 2200  ceFEPIme  (MAXIPIME ) 2 g in sodium chloride  0.9 % 100 mL IVPB        2 g 200 mL/hr over 30 Minutes Intravenous Every 24 hours 11/20/23 2123 11/27/23 2159   11/20/23 2130  metroNIDAZOLE  (FLAGYL ) IVPB 500 mg  Status:  Discontinued        500 mg 100 mL/hr over 60 Minutes Intravenous Every 12 hours 11/20/23 2120 11/21/23 0826   11/20/23 2122  vancomycin  variable dose per unstable renal function (pharmacist dosing)         Does not apply See admin instructions 11/20/23 2123     11/20/23 2115  ceFEPIme  (MAXIPIME ) 2 g in sodium chloride  0.9 % 100 mL IVPB        2 g 200 mL/hr over 30 Minutes Intravenous  Once 11/20/23 2100 11/20/23 2156   11/20/23 2115  Vancomycin  (VANCOCIN ) 1,250 mg in sodium chloride  0.9 % 250 mL IVPB        1,250 mg 166.7 mL/hr over 90 Minutes Intravenous  Once 11/20/23 2100 11/20/23 2300   11/20/23 2045  azithromycin  (ZITHROMAX ) 500 mg in sodium chloride  0.9 % 250 mL IVPB  Status:  Discontinued        500 mg 250 mL/hr over 60 Minutes Intravenous  Once 11/20/23 2039 11/20/23 2054  11/20/23 2045  cefTRIAXone  (ROCEPHIN ) 1 g in sodium chloride  0.9 % 100 mL IVPB  Status:  Discontinued        1 g 200 mL/hr over 30 Minutes Intravenous  Once 11/20/23 2039 11/20/23 2054        Subjective: Patient seen and examined at bedside.  Poor historian, only moans intermittently.  No fever, seizures, vomiting reported.  Objective: Vitals:   11/22/23 0300 11/22/23 0500 11/22/23 0811  11/22/23 1151  BP: 113/69 (!) 113/54 121/75 125/62  Pulse: 100 91    Resp: (!) 23 (!) 28    Temp: 98.8 F (37.1 C)  99.6 F (37.6 C) 99.4 F (37.4 C)  TempSrc: Axillary  Axillary Axillary  SpO2: 97% 99%    Weight:  53.5 kg    Height:        Intake/Output Summary (Last 24 hours) at 11/22/2023 1157 Last data filed at 11/22/2023 2841 Gross per 24 hour  Intake 1796.43 ml  Output 1450 ml  Net 346.43 ml   Filed Weights   11/20/23 1939 11/22/23 0500  Weight: 60 kg 53.5 kg    Examination:  General exam: Appears calm and comfortable.  Looks chronically ill and deconditioned. Respiratory system: Bilateral decreased breath sounds at bases with scattered crackles Cardiovascular system: S1 & S2 heard, Rate controlled Gastrointestinal system: Abdomen is nondistended, soft and nontender. Normal bowel sounds heard.  PEG tube present Extremities: No cyanosis, clubbing, edema  Central nervous system: Does not follow commands.  No focal neurological deficits. Moving extremities Skin: No rashes, lesions or ulcers Psychiatry: Could not be assessed because of mental status    Data Reviewed: I have personally reviewed following labs and imaging studies  CBC: Recent Labs  Lab 11/20/23 1945 11/20/23 2151 11/21/23 0300 11/22/23 0900  WBC 16.1*  --  14.5* 7.6  NEUTROABS 14.5*  --   --   --   HGB 10.7* 16.0 9.6* 8.4*  HCT 37.1* 47.0 33.4* 28.1*  MCV 92.3  --  92.8 88.6  PLT 229  --  164 150   Basic Metabolic Panel: Recent Labs  Lab 11/20/23 1945 11/20/23 2119 11/20/23 2151 11/21/23 0032 11/21/23 0300 11/21/23 1535 11/21/23 1721 11/21/23 1923 11/22/23 0900  NA 156*  --  160* 156* 154*  --   --  148* 149*  K 5.7*  --  5.3* 4.7 5.0  --   --  4.7 4.8  CL 123*  --   --  125* 120*  --   --  117* 121*  CO2 21*  --   --  21* 21*  --   --  24 23  GLUCOSE 159*  --   --  165* 101*  --   --  98 103*  BUN 112*  --   --  110* 104*  --   --  85* 83*  CREATININE 2.08*  --   --  1.89* 1.69*   --   --  1.60* 1.61*  CALCIUM 10.2  --   --  9.6 9.7  --   --  9.5 9.5  MG  --  2.7*  --   --  2.5*  --   --   --   --   PHOS  --  3.3  --   --  2.9 3.8 4.0  --  4.3   GFR: Estimated Creatinine Clearance: 24 mL/min (A) (by C-G formula based on SCr of 1.61 mg/dL (H)). Liver Function Tests: Recent Labs  Lab 11/20/23  1945 11/21/23 0300  AST 33 32  ALT 20 20  ALKPHOS 60 64  BILITOT 0.7 0.7  PROT 8.1 7.2  ALBUMIN  2.4* 2.3*   No results for input(s): "LIPASE", "AMYLASE" in the last 168 hours. Recent Labs  Lab 11/20/23 2119  AMMONIA <13   Coagulation Profile: Recent Labs  Lab 11/20/23 2106  INR 1.4*   Cardiac Enzymes: Recent Labs  Lab 11/20/23 2119  CKTOTAL 21*   BNP (last 3 results) No results for input(s): "PROBNP" in the last 8760 hours. HbA1C: No results for input(s): "HGBA1C" in the last 72 hours. CBG: Recent Labs  Lab 11/21/23 2034 11/22/23 0008 11/22/23 0322 11/22/23 0810 11/22/23 1150  GLUCAP 90 93 95 94 98   Lipid Profile: No results for input(s): "CHOL", "HDL", "LDLCALC", "TRIG", "CHOLHDL", "LDLDIRECT" in the last 72 hours. Thyroid  Function Tests: Recent Labs    11/20/23 2119 11/21/23 0032  TSH 6.870*  --   FREET4  --  1.00   Anemia Panel: No results for input(s): "VITAMINB12", "FOLATE", "FERRITIN", "TIBC", "IRON ", "RETICCTPCT" in the last 72 hours. Sepsis Labs: Recent Labs  Lab 11/20/23 2119 11/21/23 0032 11/21/23 0243 11/21/23 0300 11/21/23 0830  PROCALCITON 2.50  --   --   --   --   LATICACIDVEN  --  3.6* 3.2* 3.2* 1.9    Recent Results (from the past 240 hours)  Resp panel by RT-PCR (RSV, Flu A&B, Covid) Anterior Nasal Swab     Status: None   Collection Time: 11/20/23  7:40 PM   Specimen: Anterior Nasal Swab  Result Value Ref Range Status   SARS Coronavirus 2 by RT PCR NEGATIVE NEGATIVE Final   Influenza A by PCR NEGATIVE NEGATIVE Final   Influenza B by PCR NEGATIVE NEGATIVE Final    Comment: (NOTE) The Xpert Xpress  SARS-CoV-2/FLU/RSV plus assay is intended as an aid in the diagnosis of influenza from Nasopharyngeal swab specimens and should not be used as a sole basis for treatment. Nasal washings and aspirates are unacceptable for Xpert Xpress SARS-CoV-2/FLU/RSV testing.  Fact Sheet for Patients: BloggerCourse.com  Fact Sheet for Healthcare Providers: SeriousBroker.it  This test is not yet approved or cleared by the [place]  FDA and has been authorized for detection and/or diagnosis of SARS-CoV-2 by FDA under an Emergency Use Authorization (EUA). This EUA will remain in effect (meaning this test can be used) for the duration of the COVID-19 declaration under Section 564(b)(1) of the Act, 21 U.S.C. section 360bbb-3(b)(1), unless the authorization is terminated or revoked.     Resp Syncytial Virus by PCR NEGATIVE NEGATIVE Final    Comment: (NOTE) Fact Sheet for Patients: BloggerCourse.com  Fact Sheet for Healthcare Providers: SeriousBroker.it  This test is not yet approved or cleared by the [place]  FDA and has been authorized for detection and/or diagnosis of SARS-CoV-2 by FDA under an Emergency Use Authorization (EUA). This EUA will remain in effect (meaning this test can be used) for the duration of the COVID-19 declaration under Section 564(b)(1) of the Act, 21 U.S.C. section 360bbb-3(b)(1), unless the authorization is terminated or revoked.  Performed at [facility], [address], [place], [place] [address]   Blood culture (routine x 2)     Status: None (Preliminary result)   Collection Time: 11/20/23  8:52 PM   Specimen: BLOOD  Result Value Ref Range Status   Specimen Description BLOOD BLOOD RIGHT HAND  Final   Special Requests AEROBIC BOTTLE ONLY Blood Culture adequate volume  Final  Culture   Final    NO GROWTH 2 DAYS Performed at [facility],  [address] [place], [place] [address]    Report Status PENDING  Incomplete  Blood culture (routine x 2)     Status: None (Preliminary result)   Collection Time: 11/20/23  9:06 PM   Specimen: BLOOD  Result Value Ref Range Status   Specimen Description BLOOD BLOOD LEFT HAND  Final   Special Requests AEROBIC BOTTLE ONLY Blood Culture adequate volume  Final   Culture   Final    NO GROWTH 2 DAYS Performed at [name], [address], [place], [place] [address]    Report Status PENDING  Incomplete  Surgical pcr screen     Status: None   Collection Time: 11/21/23  3:17 PM   Specimen: Nasal Mucosa; Nasal Swab  Result Value Ref Range Status   MRSA, PCR NEGATIVE NEGATIVE Final   Staphylococcus aureus NEGATIVE NEGATIVE Final    Comment: (NOTE) The Xpert SA Assay (FDA approved for NASAL specimens in patients 89 years of age and older), is one component of a comprehensive surveillance program. It is not intended to diagnose infection nor to guide or monitor treatment. Performed at [facility], [address] [place], [place] [address]          Radiology Studies: CT ABDOMEN PELVIS WO CONTRAST Result Date: 11/21/2023 CLINICAL DATA:  Abdominal pain and possible sepsis EXAM: CT ABDOMEN AND PELVIS WITHOUT CONTRAST TECHNIQUE: Multidetector CT imaging of the abdomen and pelvis was performed following the standard protocol without IV contrast. RADIATION DOSE REDUCTION: This exam was performed according to the departmental dose-optimization program which includes automated exposure control, adjustment of the mA and/or kV according to patient size and/or use of iterative reconstruction technique. COMPARISON:  10/06/2023 FINDINGS: Lower chest: Improved left basilar opacity with moderate residual. Hepatobiliary: No focal liver abnormality is seen. No gallstones, gallbladder wall thickening, or biliary dilatation. Pancreas: Unremarkable. No pancreatic ductal dilatation or surrounding  inflammatory changes. Spleen: Normal in size without focal abnormality. Adrenals/Urinary Tract: Adrenal glands are unremarkable. Kidneys are normal, without renal calculi, focal lesion, or hydronephrosis. Bladder is unremarkable. Stomach/Bowel: Stomach is within normal limits. Appendix not clearly visualized. There is a percutaneous gastrostomy tube with tip in the stomach. No evidence of bowel wall thickening, distention, or inflammatory changes. Vascular/Lymphatic: Aortic atherosclerosis. No enlarged abdominal or pelvic lymph nodes. Reproductive: Markedly enlarged prostate measuring 5.3 x 4.8 x 3.8 cm. Other: None Musculoskeletal: Multilevel degenerative disc disease. L4-5 PLIF. No acute abnormality. Widened L3-4 disc space with disc vacuum phenomena. IMPRESSION: 1. No acute abnormality of the abdomen or pelvis. 2. Improved left basilar opacity with moderate residual. 3. Markedly enlarged prostate. Aortic Atherosclerosis (ICD10-I70.0). Electronically Signed   By: Juanetta Nordmann M.D.   On: 11/21/2023 00:54   DG Chest Portable 1 View Result Date: 11/20/2023 CLINICAL DATA:  Productive cough, dementia EXAM: PORTABLE CHEST 1 VIEW COMPARISON:  10/06/2023 FINDINGS: Two frontal views of the chest demonstrate an unremarkable cardiac silhouette. There are patchy ground-glass opacities throughout the left lung, with overall improvement in appearance since prior study. It is unclear whether this reflects residual pneumonia or recurrent infection or aspiration. No effusion or pneumothorax. Chronic nonunion left humeral neck fracture. No acute bony abnormalities. IMPRESSION: 1. Patchy left-sided ground-glass airspace disease, improved since prior x-ray, consistent with residual or recurrent pneumonia versus aspiration. Electronically Signed   By: Bobbye Burrow M.D.   On: 11/20/2023 20:31  Scheduled Meds:  Chlorhexidine  Gluconate Cloth  6 each Topical Daily   free water   200 mL Per Tube Q6H   levothyroxine   75  mcg Per Tube Q0600   thiamine  (VITAMIN B1) injection  100 mg Intravenous Daily   vancomycin  variable dose per unstable renal function (pharmacist dosing)   Does not apply See admin instructions   Continuous Infusions:  ceFEPime  (MAXIPIME ) IV     feeding supplement (JEVITY 1.5 CAL/FIBER) 60 mL/hr at 11/22/23 1000          Audria Leather, MD [facility] 11/22/2023, 11:57 AM

## 2023-11-22 NOTE — Progress Notes (Addendum)
 Initial Nutrition Assessment  DOCUMENTATION CODES:   Severe malnutrition in context of chronic illness  INTERVENTION:  Continue tube feeding via PEG: Jevity 1.5 at 60 ml/h (1440 ml per day) FWF q6 hours (800 ml per day)  Provides 2160 kcal, 92 gm protein, 1894 ml free water  daily (TF + FWF)   Recommend B12 draw and supplementation, if indicated   If in line with goals of care, consider converting G tube into a G-J tube which may help decrease vomiting and aspiration risk of tube feedings, but not secretions or gastric contents  Monitor magnesium, potassium, and phosphorus daily for at least 3 days, MD to replete as needed   Continue Thiamine  100 mg daily   Continue MVI w/ minerals  NUTRITION DIAGNOSIS:  Severe Malnutrition related to chronic illness (dysphagia (PEG dependent) and dementia) as evidenced by percent weight loss, severe muscle depletion, severe fat depletion.  GOAL:  Patient will meet greater than or equal to 90% of their needs  MONITOR:  TF tolerance, Skin, Weight trends  REASON FOR ASSESSMENT:  Consult Assessment of nutrition requirement/status, Enteral/tube feeding initiation and management  ASSESSMENT:  Pt with PMH significant for: dementia, dysphagia s/p PEG tube, HTN, HLD, hypothyroidism, gout, BPH. Presented with fever/chills, dysuria and increased frequency of urination. Found to be septic likely d/t aspiration PNA.   Feeds continue to be titrated up to goal rate. Slow titration, as unable to confirm if he was receiving home regimen, as ordered. Of note, on previous admission, it was noted that family was administering bolus feedings with patient lying down. Educated regarding feeding boluses while he is sitting up. Family also noted with issues affording patient's tube feed formula. On assistance program currently, but discharge with recommendations for over the counter supplement, if needed. Son has reported they are unable to afford that as well,  in the past.  Saw patient at bedside with Palliative. No family present. He continues with significant amount of secretions that are difficult to expectorate. Thick layer of mucous covering tongue and roof of his mouth. GOC discussions ongoing, however family not willing to pursue comfort as of yet. If in line with goals of care, consider converting G tube into a G-J tube which may help decrease vomiting and aspiration risk of tube feedings, but not of secretions or gastric contents. Likely aspirating secretions and not tube feedings, if being administered appropriately.   Admit/Current Weight: 53.5kg  Per chart review, has shown 10.2% weight loss in last month, since admission. This is considered clinically significant for the time frame reviewed. Likely 2/2 continued age- and disease-related decline. Skin integrity desirable, with only deep tissue injury to L thigh noted.   Drains/Lines: LUQ: PEG (16Fr) plaed 09/29/2023 Foley catheter UOP: x24 hours  Sodium high, but trending down. Severe dehydration likely precipitating AKI. Crt down trending as well. Free water  flushes in place. Corrected calcium elevated likely 2/2 immobility and AKI. Monitor trend. No calcium supplements in place. Lactic acid significantly elevated and now down to within desirable limits.  Of note, patient was on Reglan  last admission as a prokinetic agent. Consider reinitiating.   Meds: levothyroxine , thiamine , IV ABX  Labs:  Na+ 156>154>148>149 (H) K+ 4.7>5.0>4.7>4.8 (wdl) Corr Ca 10.9 (H) Crt 1.30>8.65>7.84>6.96 (H) WBC 16.1>14.5>7.6 (wdl) Lactic Acid 7.4>3.6>3.2>3.2>1.9 (wdl) CBGs 98-165 x24 hours  NUTRITION - FOCUSED PHYSICAL EXAM:  Flowsheet Row Most Recent Value  Orbital Region Severe depletion  Upper Arm Region Severe depletion  Thoracic and Lumbar Region Severe depletion  Buccal  Region Severe depletion  Temple Region Severe depletion  Clavicle Bone Region Severe depletion  Clavicle and  Acromion Bone Region Severe depletion  Scapular Bone Region Severe depletion  Dorsal Hand Severe depletion  Patellar Region Severe depletion  Anterior Thigh Region Severe depletion  Posterior Calf Region Severe depletion  Edema (RD Assessment) None  Hair Reviewed  Eyes Reviewed  Mouth Reviewed  Skin Reviewed  Nails Reviewed    Diet Order:   Diet Order             Diet NPO time specified  Diet effective now             EDUCATION NEEDS:   Not appropriate for education at this time  Skin:  Skin Integrity Issues:: DTI DTI: L thigh  Last BM:  PTA  Height:  Ht Readings from Last 1 Encounters:  11/20/23 6' (1.829 m)   Weight:  Wt Readings from Last 1 Encounters:  11/22/23 53.5 kg   Ideal Body Weight:     BMI:  Body mass index is 16 kg/m.  Estimated Nutritional Needs:   Kcal:  1800-2000 kcals  Protein:  90-110g  Fluid:  >1.8L/day  Con Decant MS, RD, LDN Registered Dietitian Clinical Nutrition RD Inpatient Contact Info in Amion

## 2023-11-22 NOTE — Progress Notes (Signed)
 Palliative Medicine Progress Note   Patient Name: [name] [name]       Date: 11/22/2023 DOB: January 17, 1934  Age: 89 y.o. MRN#: [id number] Attending Physician: Audria Leather, MD Primary Care Physician: Lawrance Presume, MD Admit Date: 11/20/2023    HPI/Patient Profile: 89 y.o. male  with past medical history of dementia, dysphagia status post PEG tube, hypertension, hypothyroidism chronic CHF, BPH, and gout who presented to the ED on 11/20/2023 with fever, chills, urinary frequency, and concern for possible UTI.  In the ED, chest x-ray shows patchy left-sided groundglass airspace disease, improved since prior x-ray, consistent with residual or recurrent pneumonia versus aspiration. Initial lactic acid was 7.4.    Patient is admitted with septic shock secondary to aspiration pneumonia, AKI, lactic acidosis, and hypernatremia.   Palliative Medicine is consulted for goals of care and medical decision.    Subjective: Chart reviewed. Updates received from RN and RD.   Patient assessed at bedside. He is not in acute distress; not moaning today.  He tracks and seems to acknowledge my presence, but does not follow commands and is non-communicative.  I later spoke with [name] by phone.  He states patient is "doing better".  I shared my concern that although there is slight clinical improvement, aspiration pneumonia will continue to be a recurrent issue.  Son does not seem open to detailed GOC discussion by phone at this time, but is willing to meet in person tomorrow morning.   Objective:  Physical Exam Vitals reviewed.  Constitutional:      General: He is awake. He is not in acute distress.    Appearance: He is cachectic. He is ill-appearing.  Pulmonary:     Effort: Pulmonary effort is normal.   Abdominal:     Comments: PEG in place  Psychiatric:        Speech: He is noncommunicative.             Palliative Medicine Assessment & Plan   Assessment: Principal Problem:   Severe sepsis (HCC) Active Problems:   Hypothyroidism   Anemia, chronic disease   Essential hypertension   Hyperkalemia   Dysphagia   Aspiration pneumonia (HCC)   Elevated troponin   AKI (acute kidney injury) (HCC)   Protein-calorie malnutrition, severe   PEG (percutaneous endoscopic gastrostomy) status (HCC)   Failure to thrive  in adult   Dehydration   Hypernatremia    Recommendations/Plan: Continue full scope care Goal of care is medical stabilization Ongoing GOC discussions   Primary Decision Maker: NEXT OF KIN - [name]   Code Status/Advance Care Planning: Full code  Prognosis:  Unable to determine  Discharge Planning: Home   Thank you for allowing the Palliative Medicine Team to assist in the care of this patient.   Time: 35 minutes   Wynetta Heckle, NP   Please contact Palliative Medicine Team phone at [phone] for questions and concerns.  For individual providers, please see AMION.

## 2023-11-23 ENCOUNTER — Inpatient Hospital Stay (HOSPITAL_COMMUNITY): Payer: MEDICAID

## 2023-11-23 LAB — GLUCOSE, CAPILLARY
Glucose-Capillary: 103 mg/dL — ABNORMAL HIGH (ref 70–99)
Glucose-Capillary: 104 mg/dL — ABNORMAL HIGH (ref 70–99)
Glucose-Capillary: 113 mg/dL — ABNORMAL HIGH (ref 70–99)
Glucose-Capillary: 123 mg/dL — ABNORMAL HIGH (ref 70–99)
Glucose-Capillary: 131 mg/dL — ABNORMAL HIGH (ref 70–99)
Glucose-Capillary: 133 mg/dL — ABNORMAL HIGH (ref 70–99)
Glucose-Capillary: 156 mg/dL — ABNORMAL HIGH (ref 70–99)

## 2023-11-23 LAB — COMPREHENSIVE METABOLIC PANEL WITH GFR
ALT: 19 U/L (ref 0–44)
AST: 37 U/L (ref 15–41)
Albumin: 1.8 g/dL — ABNORMAL LOW (ref 3.5–5.0)
Alkaline Phosphatase: 61 U/L (ref 38–126)
Anion gap: 7 (ref 5–15)
BUN: 75 mg/dL — ABNORMAL HIGH (ref 8–23)
CO2: 24 mmol/L (ref 22–32)
Calcium: 9.4 mg/dL (ref 8.9–10.3)
Chloride: 116 mmol/L — ABNORMAL HIGH (ref 98–111)
Creatinine, Ser: 1.46 mg/dL — ABNORMAL HIGH (ref 0.61–1.24)
GFR, Estimated: 46 mL/min — ABNORMAL LOW (ref >60)
Glucose, Bld: 135 mg/dL — ABNORMAL HIGH (ref 70–99)
Potassium: 4.6 mmol/L (ref 3.5–5.1)
Sodium: 147 mmol/L — ABNORMAL HIGH (ref 135–145)
Total Bilirubin: 0.3 mg/dL (ref 0.0–1.2)
Total Protein: 6.6 g/dL (ref 6.5–8.1)

## 2023-11-23 LAB — CBC WITH DIFFERENTIAL/PLATELET
Abs Immature Granulocytes: 0.1 10*3/uL — ABNORMAL HIGH (ref 0.00–0.07)
Basophils Absolute: 0 10*3/uL (ref 0.0–0.1)
Basophils Relative: 0 %
Eosinophils Absolute: 0 10*3/uL (ref 0.0–0.5)
Eosinophils Relative: 0 %
HCT: 28.9 % — ABNORMAL LOW (ref 39.0–52.0)
Hemoglobin: 8.6 g/dL — ABNORMAL LOW (ref 13.0–17.0)
Lymphocytes Relative: 12 %
Lymphs Abs: 0.9 10*3/uL (ref 0.7–4.0)
MCH: 27 pg (ref 26.0–34.0)
MCHC: 29.8 g/dL — ABNORMAL LOW (ref 30.0–36.0)
MCV: 90.6 fL (ref 80.0–100.0)
Monocytes Absolute: 0 10*3/uL — ABNORMAL LOW (ref 0.1–1.0)
Monocytes Relative: 0 %
Neutro Abs: 6.9 10*3/uL (ref 1.7–7.7)
Neutrophils Relative %: 87 %
Platelets: 155 10*3/uL (ref 150–400)
Promyelocytes Relative: 1 %
RBC: 3.19 MIL/uL — ABNORMAL LOW (ref 4.22–5.81)
RDW: 18.5 % — ABNORMAL HIGH (ref 11.5–15.5)
WBC: 7.9 10*3/uL (ref 4.0–10.5)
nRBC: 0 % (ref 0.0–0.2)
nRBC: 0 /100{WBCs}

## 2023-11-23 LAB — VITAMIN B12: Vitamin B-12: 402 pg/mL (ref 180–914)

## 2023-11-23 LAB — PHOSPHORUS: Phosphorus: 3.8 mg/dL (ref 2.5–4.6)

## 2023-11-23 LAB — MAGNESIUM: Magnesium: 2.3 mg/dL (ref 1.7–2.4)

## 2023-11-23 MED ORDER — DEXTROSE 5 % IV SOLN: INTRAVENOUS | Status: AC

## 2023-11-23 MED ORDER — LIDOCAINE 5 % EX PTCH
1.0000 | MEDICATED_PATCH | CUTANEOUS | Status: DC
  Administered 2023-11-23 – 2023-12-06 (×13): 1 via TRANSDERMAL
  Filled 2023-11-23 (×12): qty 1

## 2023-11-23 NOTE — Progress Notes (Signed)
 Palliative Medicine Progress Note   Patient Name: [name] [name]       Date: 11/23/2023 DOB: 08/17/1933  Age: 89 y.o. MRN#: [id number] Attending Physician: Audria Leather, MD Primary Care Physician: Lawrance Presume, MD Admit Date: 11/20/2023  Reason for Consultation/Follow-up: {Reason for Consult:23484}  HPI/Patient Profile: 89 y.o. male  with past medical history of dementia, dysphagia status post PEG tube, hypertension, hypothyroidism chronic CHF, BPH, and gout who presented to the ED on 11/20/2023 with fever, chills, urinary frequency, and concern for possible UTI.  In the ED, chest x-ray shows patchy left-sided groundglass airspace disease, improved since prior x-ray, consistent with residual or recurrent pneumonia versus aspiration. Initial lactic acid was 7.4.    Patient is admitted with septic shock secondary to aspiration pneumonia, AKI, lactic acidosis, and hypernatremia.   Palliative Medicine is consulted for goals of care and medical decision.   Subjective: Chart reviewed.   Patient assessed at bedside. He is moaning intermittently.   Objective:  Physical Exam Vitals reviewed.  Constitutional:      General: He is awake. He is not in acute distress.    Appearance: He is cachectic. He is ill-appearing.  Cardiovascular:     Rate and Rhythm: Tachycardia present.  Pulmonary:     Effort: Tachypnea present.  Abdominal:     Comments: PEG in place  Neurological:     Motor: Weakness present.  Psychiatric:        Speech: He is noncommunicative.      Palliative Medicine Assessment & Plan   Assessment: Principal Problem:   Severe sepsis (HCC) Active Problems:   Hypothyroidism   Anemia, chronic disease   Essential hypertension   Hyperkalemia   Dysphagia   Aspiration  pneumonia (HCC)   Elevated troponin   AKI (acute kidney injury) (HCC)   Protein-calorie malnutrition, severe   PEG (percutaneous endoscopic gastrostomy) status (HCC)   Failure to thrive in adult   Dehydration   Hypernatremia    Recommendations/Plan: ***  Goals of Care and Additional Recommendations: Limitations on Scope of Treatment: {Recommended Scope and Preferences:21019}  Code Status:   Prognosis:  {Palliative Care Prognosis:23504}  Discharge Planning: {Palliative dispostion:23505}  Care plan was discussed with ***  Thank you for allowing the Palliative Medicine Team to assist in the care of this patient.   ***  Clarice Bonaventure B Sharday Michl, NP   Please contact Palliative Medicine Team phone at [phone] for questions and concerns.  For individual providers, please see AMION.

## 2023-11-23 NOTE — Progress Notes (Signed)
 PROGRESS NOTE    [name] [name]  ZOX:[id number] DOB: 1934/01/13 DOA: 11/20/2023 PCP: Lawrance Presume, MD   Brief Narrative:  89 y.o. male with medical history significant of dementia, dysphagia status post PEG tube, hypertension, hyperlipidemia, hypothyroidism, gout, BPH presented with  fevers chills dysuria increased increased frequency in urination.  At baseline, patient is bedbound and moves throughout the day.  On presentation, he was mildly tachycardic and tachypneic with WBCs of 16.1, sodium of 160, potassium 5.7, creatinine 2.08, lactic acid of 7.4, troponin 149, procalcitonin 2.5.  UA negative for UTI.  Influenza/COVID/RSV PCR negative.  Chest x-ray consistent with residual or recurrent pneumonia versus aspiration.  He was started on broad-spectrum antibiotics.  Palliative care consulted.  Assessment & Plan:   Severe sepsis: Present on admission Possible aspiration pneumonia -Presented with leukocytosis, tachycardia, tachypnea, lactic acidosis possibly from aspiration pneumonia.  Patient is bedbound and has history of recurrent aspirations. - Continue cefepime .  Cultures negative so far.  DC'd vancomycin  on 11/14/2023. - Patient is very tachypneic this morning.  Will check chest x-ray.  Acute kidney injury Dehydration -Improving.  Creatinine 1.46 today.  Monitor  Hypernatremia - Sodium 160 on presentation.  Improving to 147 today.  Continue free water  via tube.  Continue gentle hydration.  Failure to thrive in adult Goals of care Severe protein calorie malnutrition Dysphagia status post PEG tube placement History of dementia -Continue PEG tube feeding.  Patient possibly has reached to end of his life and prognosis is very poor.  Palliative care following.  Patient remains full code.  Elevated troponin - Did not trend upwards.  Possibly from demand ischemia.  No further workup needed  Hyperkalemia -Resolved with IV fluids  Leukocytosis Resolved  Anemia of chronic disease -  From chronic illnesses.  Hemoglobin currently stable.  Monitor intermittently  Hypothyroidism -Continue Synthroid   Essential hypertension - Blood pressure stable.  Amlodipine  on hold    DVT prophylaxis: SCDs Code Status: Full Family Communication: None at bedside Disposition Plan: Status is: Inpatient Remains inpatient appropriate because: Of severity of illness    Consultants: Palliative care  Procedures: None  Antimicrobials:  Anti-infectives (From admission, onward)    Start     Dose/Rate Route Frequency Ordered Stop   11/21/23 2200  ceFEPIme  (MAXIPIME ) 2 g in sodium chloride  0.9 % 100 mL IVPB        2 g 200 mL/hr over 30 Minutes Intravenous Every 24 hours 11/20/23 2123 11/27/23 2159   11/20/23 2130  metroNIDAZOLE  (FLAGYL ) IVPB 500 mg  Status:  Discontinued        500 mg 100 mL/hr over 60 Minutes Intravenous Every 12 hours 11/20/23 2120 11/21/23 0826   11/20/23 2122  vancomycin  variable dose per unstable renal function (pharmacist dosing)  Status:  Discontinued         Does not apply See admin instructions 11/20/23 2123 11/22/23 1206   11/20/23 2115  ceFEPIme  (MAXIPIME ) 2 g in sodium chloride  0.9 % 100 mL IVPB        2 g 200 mL/hr over 30 Minutes Intravenous  Once 11/20/23 2100 11/20/23 2156   11/20/23 2115  Vancomycin  (VANCOCIN ) 1,250 mg in sodium chloride  0.9 % 250 mL IVPB        1,250 mg 166.7 mL/hr over 90 Minutes Intravenous  Once 11/20/23 2100 11/20/23 2300   11/20/23 2045  azithromycin  (ZITHROMAX ) 500 mg in sodium chloride  0.9 % 250 mL IVPB  Status:  Discontinued        500 mg 250  mL/hr over 60 Minutes Intravenous  Once 11/20/23 2039 11/20/23 2054   11/20/23 2045  cefTRIAXone  (ROCEPHIN ) 1 g in sodium chloride  0.9 % 100 mL IVPB  Status:  Discontinued        1 g 200 mL/hr over 30 Minutes Intravenous  Once 11/20/23 2039 11/20/23 2054        Subjective: Patient seen and examined at bedside.  Poor historian, continues to moan intermittently.  No agitation,  fever, seizures reported. Objective: Vitals:   11/22/23 2000 11/23/23 0000 11/23/23 0400 11/23/23 0430  BP: (!) 118/59 119/62 133/61   Pulse: 98 100 (!) 104   Resp: 20 18 (!) 21   Temp: 99.4 F (37.4 C) 99.1 F (37.3 C) 99.3 F (37.4 C)   TempSrc: Axillary Axillary Axillary   SpO2: 100% 97% 95%   Weight:    52 kg  Height:        Intake/Output Summary (Last 24 hours) at 11/23/2023 0735 Last data filed at 11/22/2023 2342 Gross per 24 hour  Intake --  Output 350 ml  Net -350 ml   Filed Weights   11/20/23 1939 11/22/23 0500 11/23/23 0430  Weight: 60 kg 53.5 kg 52 kg    Examination:  General: On room air.  No distress.  Chronically ill and deconditioned looking. ENT/neck: No thyromegaly.  JVD is not elevated  respiratory: Decreased breath sounds at bases bilaterally with some crackles; no wheezing with intermittent tachypnea  CVS: S1-S2 heard, intermittently tachycardic Abdominal: Soft, nontender, slightly distended; no organomegaly, bowel sounds are heard.  PEG tube is present. Extremities: Trace lower extremity edema; no cyanosis  CNS: Awake, does not follow commands.  No obvious focal deficits  lymph: No obvious lymphadenopathy Skin: No obvious ecchymosis/lesions  psych: Cannot assess because of mental status musculoskeletal: No obvious joint swelling/deformity     Data Reviewed: I have personally reviewed following labs and imaging studies  CBC: Recent Labs  Lab 11/20/23 1945 11/20/23 2151 11/21/23 0300 11/22/23 0900 11/23/23 0449  WBC 16.1*  --  14.5* 7.6 7.9  NEUTROABS 14.5*  --   --   --  6.9  HGB 10.7* 16.0 9.6* 8.4* 8.6*  HCT 37.1* 47.0 33.4* 28.1* 28.9*  MCV 92.3  --  92.8 88.6 90.6  PLT 229  --  164 150 155   Basic Metabolic Panel: Recent Labs  Lab 11/20/23 2119 11/20/23 2151 11/21/23 0032 11/21/23 0300 11/21/23 1535 11/21/23 1721 11/21/23 1923 11/22/23 0900 11/23/23 0449  NA  --    < > 156* 154*  --   --  148* 149* 147*  K  --    < >  4.7 5.0  --   --  4.7 4.8 4.6  CL  --   --  125* 120*  --   --  117* 121* 116*  CO2  --   --  21* 21*  --   --  24 23 24   GLUCOSE  --   --  165* 101*  --   --  98 103* 135*  BUN  --   --  110* 104*  --   --  85* 83* 75*  CREATININE  --   --  1.89* 1.69*  --   --  1.60* 1.61* 1.46*  CALCIUM  --   --  9.6 9.7  --   --  9.5 9.5 9.4  MG 2.7*  --   --  2.5*  --   --   --   --  2.3  PHOS 3.3  --   --  2.9 3.8 4.0  --  4.3 3.8   < > = values in this interval not displayed.   GFR: Estimated Creatinine Clearance: 25.7 mL/min (A) (by C-G formula based on SCr of 1.46 mg/dL (H)). Liver Function Tests: Recent Labs  Lab 11/20/23 1945 11/21/23 0300 11/23/23 0449  AST 33 32 37  ALT 20 20 19   ALKPHOS 60 64 61  BILITOT 0.7 0.7 0.3  PROT 8.1 7.2 6.6  ALBUMIN  2.4* 2.3* 1.8*   No results for input(s): "LIPASE", "AMYLASE" in the last 168 hours. Recent Labs  Lab 11/20/23 2119  AMMONIA <13   Coagulation Profile: Recent Labs  Lab 11/20/23 2106  INR 1.4*   Cardiac Enzymes: Recent Labs  Lab 11/20/23 2119  CKTOTAL 21*   BNP (last 3 results) No results for input(s): "PROBNP" in the last 8760 hours. HbA1C: No results for input(s): "HGBA1C" in the last 72 hours. CBG: Recent Labs  Lab 11/22/23 1150 11/22/23 1619 11/22/23 2006 11/23/23 0010 11/23/23 0421  GLUCAP 98 106* 129* 156* 133*   Lipid Profile: No results for input(s): "CHOL", "HDL", "LDLCALC", "TRIG", "CHOLHDL", "LDLDIRECT" in the last 72 hours. Thyroid  Function Tests: Recent Labs    11/20/23 2119 11/21/23 0032  TSH 6.870*  --   FREET4  --  1.00   Anemia Panel: No results for input(s): "VITAMINB12", "FOLATE", "FERRITIN", "TIBC", "IRON ", "RETICCTPCT" in the last 72 hours. Sepsis Labs: Recent Labs  Lab 11/20/23 2119 11/21/23 0032 11/21/23 0243 11/21/23 0300 11/21/23 0830  PROCALCITON 2.50  --   --   --   --   LATICACIDVEN  --  3.6* 3.2* 3.2* 1.9    Recent Results (from the past 240 hours)  Resp panel by RT-PCR  (RSV, Flu A&B, Covid) Anterior Nasal Swab     Status: None   Collection Time: 11/20/23  7:40 PM   Specimen: Anterior Nasal Swab  Result Value Ref Range Status   SARS Coronavirus 2 by RT PCR NEGATIVE NEGATIVE Final   Influenza A by PCR NEGATIVE NEGATIVE Final   Influenza B by PCR NEGATIVE NEGATIVE Final    Comment: (NOTE) The Xpert Xpress SARS-CoV-2/FLU/RSV plus assay is intended as an aid in the diagnosis of influenza from Nasopharyngeal swab specimens and should not be used as a sole basis for treatment. Nasal washings and aspirates are unacceptable for Xpert Xpress SARS-CoV-2/FLU/RSV testing.  Fact Sheet for Patients: BloggerCourse.com  Fact Sheet for Healthcare Providers: SeriousBroker.it  This test is not yet approved or cleared by the [place]  FDA and has been authorized for detection and/or diagnosis of SARS-CoV-2 by FDA under an Emergency Use Authorization (EUA). This EUA will remain in effect (meaning this test can be used) for the duration of the COVID-19 declaration under Section 564(b)(1) of the Act, 21 U.S.C. section 360bbb-3(b)(1), unless the authorization is terminated or revoked.     Resp Syncytial Virus by PCR NEGATIVE NEGATIVE Final    Comment: (NOTE) Fact Sheet for Patients: BloggerCourse.com  Fact Sheet for Healthcare Providers: SeriousBroker.it  This test is not yet approved or cleared by the [place]  FDA and has been authorized for detection and/or diagnosis of SARS-CoV-2 by FDA under an Emergency Use Authorization (EUA). This EUA will remain in effect (meaning this test can be used) for the duration of the COVID-19 declaration under Section 564(b)(1) of the Act, 21 U.S.C. section 360bbb-3(b)(1), unless the authorization is terminated or revoked.  Performed at [facility],  [address], [place], [place] [address]   Blood culture  (routine x 2)     Status: None (Preliminary result)   Collection Time: 11/20/23  8:52 PM   Specimen: BLOOD  Result Value Ref Range Status   Specimen Description BLOOD BLOOD RIGHT HAND  Final   Special Requests AEROBIC BOTTLE ONLY Blood Culture adequate volume  Final   Culture   Final    NO GROWTH 2 DAYS Performed at [facility], [address] [place], [place] [address]    Report Status PENDING  Incomplete  Blood culture (routine x 2)     Status: None (Preliminary result)   Collection Time: 11/20/23  9:06 PM   Specimen: BLOOD  Result Value Ref Range Status   Specimen Description BLOOD BLOOD LEFT HAND  Final   Special Requests AEROBIC BOTTLE ONLY Blood Culture adequate volume  Final   Culture   Final    NO GROWTH 2 DAYS Performed at [facility], [address] [place], [place] [address]    Report Status PENDING  Incomplete  Surgical pcr screen     Status: None   Collection Time: 11/21/23  3:17 PM   Specimen: Nasal Mucosa; Nasal Swab  Result Value Ref Range Status   MRSA, PCR NEGATIVE NEGATIVE Final   Staphylococcus aureus NEGATIVE NEGATIVE Final    Comment: (NOTE) The Xpert SA Assay (FDA approved for NASAL specimens in patients 56 years of age and older), is one component of a comprehensive surveillance program. It is not intended to diagnose infection nor to guide or monitor treatment. Performed at [facility], [address], [place], [place] [address]          Radiology Studies: US  EKG SITE RITE Result Date: 11/22/2023 If [facility] image not attached, placement could not be confirmed due to current cardiac rhythm.       Scheduled Meds:  Chlorhexidine  Gluconate Cloth  6 each Topical Daily   free water   200 mL Per Tube Q6H   levothyroxine   75 mcg Per Tube Q0600   sodium chloride  flush  10-40 mL Intracatheter Q12H   thiamine  (VITAMIN B1) injection  100 mg Intravenous Daily   Continuous Infusions:  ceFEPime  (MAXIPIME ) IV 2 g (11/22/23  2120)   dextrose  75 mL/hr at 11/23/23 0423   feeding supplement (JEVITY 1.5 CAL/FIBER) 1,000 mL (11/23/23 0421)          Audria Leather, MD [facility] 11/23/2023, 7:35 AM

## 2023-11-24 ENCOUNTER — Inpatient Hospital Stay (HOSPITAL_COMMUNITY): Payer: MEDICAID

## 2023-11-24 LAB — GLUCOSE, CAPILLARY
Glucose-Capillary: 117 mg/dL — ABNORMAL HIGH (ref 70–99)
Glucose-Capillary: 126 mg/dL — ABNORMAL HIGH (ref 70–99)
Glucose-Capillary: 119 mg/dL — ABNORMAL HIGH (ref 70–99)
Glucose-Capillary: 135 mg/dL — ABNORMAL HIGH (ref 70–99)
Glucose-Capillary: 135 mg/dL — ABNORMAL HIGH (ref 70–99)

## 2023-11-24 LAB — CBC WITH DIFFERENTIAL/PLATELET
Abs Immature Granulocytes: 0.09 10*3/uL — ABNORMAL HIGH (ref 0.00–0.07)
Basophils Absolute: 0 10*3/uL (ref 0.0–0.1)
Basophils Relative: 0 %
Eosinophils Absolute: 0 10*3/uL (ref 0.0–0.5)
Eosinophils Relative: 0 %
HCT: 26.6 % — ABNORMAL LOW (ref 39.0–52.0)
Hemoglobin: 7.8 g/dL — ABNORMAL LOW (ref 13.0–17.0)
Immature Granulocytes: 1 %
Lymphocytes Relative: 11 %
Lymphs Abs: 1 10*3/uL (ref 0.7–4.0)
MCH: 26.5 pg (ref 26.0–34.0)
MCHC: 29.3 g/dL — ABNORMAL LOW (ref 30.0–36.0)
MCV: 90.5 fL (ref 80.0–100.0)
Monocytes Absolute: 0.8 10*3/uL (ref 0.1–1.0)
Monocytes Relative: 9 %
Neutro Abs: 6.9 10*3/uL (ref 1.7–7.7)
Neutrophils Relative %: 79 %
Platelets: 126 10*3/uL — ABNORMAL LOW (ref 150–400)
RBC: 2.94 MIL/uL — ABNORMAL LOW (ref 4.22–5.81)
RDW: 18.4 % — ABNORMAL HIGH (ref 11.5–15.5)
WBC: 8.8 10*3/uL (ref 4.0–10.5)
nRBC: 0 % (ref 0.0–0.2)

## 2023-11-24 LAB — BASIC METABOLIC PANEL WITH GFR
Anion gap: 7 (ref 5–15)
BUN: 73 mg/dL — ABNORMAL HIGH (ref 8–23)
CO2: 24 mmol/L (ref 22–32)
Calcium: 9.3 mg/dL (ref 8.9–10.3)
Chloride: 114 mmol/L — ABNORMAL HIGH (ref 98–111)
Creatinine, Ser: 1.5 mg/dL — ABNORMAL HIGH (ref 0.61–1.24)
GFR, Estimated: 45 mL/min — ABNORMAL LOW (ref >60)
Glucose, Bld: 127 mg/dL — ABNORMAL HIGH (ref 70–99)
Potassium: 5 mmol/L (ref 3.5–5.1)
Sodium: 145 mmol/L (ref 135–145)

## 2023-11-24 LAB — PHOSPHORUS: Phosphorus: 4.1 mg/dL (ref 2.5–4.6)

## 2023-11-24 LAB — MAGNESIUM: Magnesium: 2.5 mg/dL — ABNORMAL HIGH (ref 1.7–2.4)

## 2023-11-24 MED ORDER — DIATRIZOATE MEGLUMINE & SODIUM 66-10 % PO SOLN
ORAL | Status: AC
  Filled 2023-11-24: qty 30

## 2023-11-24 MED ORDER — HALOPERIDOL LACTATE 5 MG/ML IJ SOLN
3.0000 mg | Freq: Once | INTRAMUSCULAR | Status: AC | PRN
Start: 2023-11-24 — End: 2023-11-24
  Administered 2023-11-24: 3 mg via INTRAVENOUS
  Filled 2023-11-24: qty 1

## 2023-11-24 MED ORDER — DIATRIZOATE MEGLUMINE & SODIUM 66-10 % PO SOLN
30.0000 mL | Freq: Once | ORAL | Status: AC
  Administered 2023-11-24: 30 mL
  Filled 2023-11-24: qty 30

## 2023-11-24 MED ORDER — SODIUM BICARBONATE 650 MG PO TABS: 650.0000 mg | ORAL_TABLET | Freq: Once | ORAL | Status: DC

## 2023-11-24 MED ORDER — FUROSEMIDE 10 MG/ML IJ SOLN
40.0000 mg | Freq: Once | INTRAMUSCULAR | Status: AC
  Administered 2023-11-24: 40 mg via INTRAVENOUS
  Filled 2023-11-24: qty 4

## 2023-11-24 NOTE — Plan of Care (Signed)
 Message to him in regards to patient's peg tube being clogged.  Attempts at declogging unsuccessful.  PEG declogging protocol initiated.  Questioned proper placement.  Abdominal x-rays ordered.

## 2023-11-24 NOTE — Plan of Care (Signed)
   Problem: Education: Goal: Knowledge of General Education information will improve Description: Including pain rating scale, medication(s)/side effects and non-pharmacologic comfort measures Outcome: Not Progressing

## 2023-11-24 NOTE — Progress Notes (Signed)
 Palliative Medicine Progress Note   Patient Name: [name] [name]       Date: 11/24/2023 DOB: December 28, 1933  Age: 89 y.o. MRN#: [id number] Attending Physician: Audria Leather, MD Primary Care Physician: Lawrance Presume, MD Admit Date: 11/20/2023   HPI/Patient Profile: 89 y.o. male  with past medical history of dementia, dysphagia status post PEG tube, hypertension, hypothyroidism chronic CHF, BPH, and gout who presented to the ED on 11/20/2023 with fever, chills, urinary frequency, and concern for possible UTI.  In the ED, chest x-ray shows patchy left-sided groundglass airspace disease, improved since prior x-ray, consistent with residual or recurrent pneumonia versus aspiration. Initial lactic acid was 7.4.    Patient is admitted with septic shock secondary to aspiration pneumonia, AKI, lactic acidosis, and hypernatremia.   Palliative Medicine is consulted for goals of care and medical decision.   Subjective: Chart reviewed.  Patient assessed at bedside.  He moans continuously at the time of my assessment.  He tracks, but is noncommunicative.  Bilateral mittens in place.   Objective:  Physical Exam Vitals reviewed.  Constitutional:      General: He is not in acute distress.    Appearance: He is cachectic. He is ill-appearing.  Pulmonary:     Effort: Tachypnea present.  Neurological:     Mental Status: He is alert.     Motor: Weakness present.  Psychiatric:        Speech: He is noncommunicative.        Cognition and Memory: Cognition is impaired. Memory is impaired.             Palliative Medicine Assessment & Plan   Assessment: Principal Problem:   Severe sepsis (HCC) Active Problems:   Hypothyroidism   Anemia, chronic disease   Essential hypertension   Hyperkalemia    Dysphagia   Aspiration pneumonia (HCC)   Elevated troponin   AKI (acute kidney injury) (HCC)   Protein-calorie malnutrition, severe   PEG (percutaneous endoscopic gastrostomy) status (HCC)   Failure to thrive in adult   Dehydration   Hypernatremia   Patient lives with his wife, son and daughter-in-law (and their 7 children), and family  all care for him at home.  Family has been educated on patient's poor prognosis in the setting of advanced (end-stage dementia) and recurrent aspiration pneumonia.  Despite education, son  has remained open to any available and offered medical interventions to prolong patient's life and believes this choice for full scope treatment is best for his father.  He is devoted to keeping patient alive despite his debilitated and noncommunicative state.   Recommendations/Plan: Do not offer or initiate medical interventions that would be medically ineffective.    Avoid pressuring family on code status. Acknowledge when death may be imminent, and prepare son/family the best way possible.  Offer education that we are doing everything possible to help patient, but if his heart stops we would not initiate CPR or put him on a life support machine because these interventions would not help if he has died.   Continue to offer education on patient's poor prognosis in the setting of end-stage dementia. PMT will continue to follow   Prognosis:  poor  Discharge Planning: To Be Determined   Thank you for allowing the Palliative Medicine Team to assist in the care of this patient.   MDM - [place]   Wynetta Heckle, NP   Please contact Palliative Medicine Team phone at [phone] for questions and concerns.  For individual providers, please see AMION.

## 2023-11-24 NOTE — Progress Notes (Signed)
 PROGRESS NOTE    Melton Walls  ZOX:[id number] DOB: 1933/12/25 DOA: 11/20/2023 PCP: Lawrance Presume, MD   Brief Narrative:  89 y.o. male with medical history significant of dementia, dysphagia status post PEG tube, hypertension, hyperlipidemia, hypothyroidism, gout, BPH presented with  fevers chills dysuria increased increased frequency in urination.  At baseline, patient is bedbound and moves throughout the day.  On presentation, he was mildly tachycardic and tachypneic with WBCs of 16.1, sodium of 160, potassium 5.7, creatinine 2.08, lactic acid of 7.4, troponin 149, procalcitonin 2.5.  UA negative for UTI.  Influenza/COVID/RSV PCR negative.  Chest x-ray consistent with residual or recurrent pneumonia versus aspiration.  He was started on broad-spectrum antibiotics.  Palliative care consulted.  Assessment & Plan:   Severe sepsis: Present on admission Possible aspiration pneumonia -Presented with leukocytosis, tachycardia, tachypnea, lactic acidosis possibly from aspiration pneumonia.  Patient is bedbound and has history of recurrent aspirations. - Continue cefepime .  Cultures negative so far.  DC'd vancomycin  on 11/22/2023. - Chest x-ray on 11/23/2023 showed possibly worsening bilateral pneumonia  Acute kidney injury Dehydration -Improving.  Creatinine 1.50 today.  Monitor  Hypernatremia - Sodium 160 on presentation.  Improving to 145 today.  Continue free water  via tube.  DC IV fluids.  Failure to thrive in adult Goals of care Severe protein calorie malnutrition Dysphagia status post PEG tube placement History of dementia -Continue PEG tube feeding.  Patient possibly has reached to end of his life and prognosis is very poor.  Palliative care following.  Patient remains full code.  Elevated troponin - Did not trend upwards.  Possibly from demand ischemia.  No further workup needed  Hyperkalemia -Resolved with IV fluids  Leukocytosis -Resolved  Anemia of chronic disease - From  chronic illnesses.  Hemoglobin currently stable.  Monitor intermittently  Hypothyroidism -Continue Synthroid   Essential hypertension - Blood pressure stable.  Amlodipine  on hold    DVT prophylaxis: SCDs Code Status: Full Family Communication: None at bedside Disposition Plan: Status is: Inpatient Remains inpatient appropriate because: Of severity of illness    Consultants: Palliative care  Procedures: None  Antimicrobials:  Anti-infectives (From admission, onward)    Start     Dose/Rate Route Frequency Ordered Stop   11/21/23 2200  ceFEPIme  (MAXIPIME ) 2 g in sodium chloride  0.9 % 100 mL IVPB        2 g 200 mL/hr over 30 Minutes Intravenous Every 24 hours 11/20/23 2123 11/27/23 2159   11/20/23 2130  metroNIDAZOLE  (FLAGYL ) IVPB 500 mg  Status:  Discontinued        500 mg 100 mL/hr over 60 Minutes Intravenous Every 12 hours 11/20/23 2120 11/21/23 0826   11/20/23 2122  vancomycin  variable dose per unstable renal function (pharmacist dosing)  Status:  Discontinued         Does not apply See admin instructions 11/20/23 2123 11/22/23 1206   11/20/23 2115  ceFEPIme  (MAXIPIME ) 2 g in sodium chloride  0.9 % 100 mL IVPB        2 g 200 mL/hr over 30 Minutes Intravenous  Once 11/20/23 2100 11/20/23 2156   11/20/23 2115  Vancomycin  (VANCOCIN ) 1,250 mg in sodium chloride  0.9 % 250 mL IVPB        1,250 mg 166.7 mL/hr over 90 Minutes Intravenous  Once 11/20/23 2100 11/20/23 2300   11/20/23 2045  azithromycin  (ZITHROMAX ) 500 mg in sodium chloride  0.9 % 250 mL IVPB  Status:  Discontinued        500 mg 250 mL/hr over  60 Minutes Intravenous  Once 11/20/23 2039 11/20/23 2054   11/20/23 2045  cefTRIAXone  (ROCEPHIN ) 1 g in sodium chloride  0.9 % 100 mL IVPB  Status:  Discontinued        1 g 200 mL/hr over 30 Minutes Intravenous  Once 11/20/23 2039 11/20/23 2054        Subjective: Patient seen and examined at bedside.  Poor historian, continues to moan intermittently.  No fever, vomiting,  agitation reported. Objective: Vitals:   11/23/23 2200 11/23/23 2249 11/23/23 2250 11/24/23 0304  BP: (!) 140/73 135/62 135/62 (!) 131/57  Pulse: (!) 113 (!) 112 (!) 108 (!) 107  Resp: 14 19 20 20   Temp:  99.2 F (37.3 C)  99.1 F (37.3 C)  TempSrc:  Oral  Axillary  SpO2: 100% 98% 99% 96%  Weight:    54.8 kg  Height:        Intake/Output Summary (Last 24 hours) at 11/24/2023 0730 Last data filed at 11/24/2023 2952 Gross per 24 hour  Intake 3736.79 ml  Output 1450 ml  Net 2286.79 ml   Filed Weights   11/22/23 0500 11/23/23 0430 11/24/23 0304  Weight: 53.5 kg 52 kg 54.8 kg    Examination:  General: No acute distress.  Remains on room air.  Chronically ill and deconditioned looking.  Very thinly built. ENT/neck: No JVD elevation or palpable neck masses noted respiratory: Bilateral decreased breath sounds at bases with scattered crackles CVS: Tachycardic; S1 and S2 are heard  abdominal: Soft, nontender, distended mildly; no organomegaly, bowel sounds are heard normally.  PEG tube present  extremities: No clubbing; mild lower extremity edema present CNS: Not able to follow commands; awake.  Intermittently moaning.  No focal deficits noted  lymph: No obvious palpable lymphadenopathy Skin: No obvious petechia/rashes  psych: Could not be assessed because of mental status musculoskeletal: No obvious joint tenderness/erythema    Data Reviewed: I have personally reviewed following labs and imaging studies  CBC: Recent Labs  Lab 11/20/23 1945 11/20/23 2151 11/21/23 0300 11/22/23 0900 11/23/23 0449 11/24/23 0300  WBC 16.1*  --  14.5* 7.6 7.9 8.8  NEUTROABS 14.5*  --   --   --  6.9 6.9  HGB 10.7* 16.0 9.6* 8.4* 8.6* 7.8*  HCT 37.1* 47.0 33.4* 28.1* 28.9* 26.6*  MCV 92.3  --  92.8 88.6 90.6 90.5  PLT 229  --  164 150 155 126*   Basic Metabolic Panel: Recent Labs  Lab 11/20/23 2119 11/20/23 2151 11/21/23 0300 11/21/23 1535 11/21/23 1721 11/21/23 1923 11/22/23 0900  11/23/23 0449 11/24/23 0300  NA  --    < > 154*  --   --  148* 149* 147* 145  K  --    < > 5.0  --   --  4.7 4.8 4.6 5.0  CL  --    < > 120*  --   --  117* 121* 116* 114*  CO2  --    < > 21*  --   --  24 23 24 24   GLUCOSE  --    < > 101*  --   --  98 103* 135* 127*  BUN  --    < > 104*  --   --  85* 83* 75* 73*  CREATININE  --    < > 1.69*  --   --  1.60* 1.61* 1.46* 1.50*  CALCIUM  --    < > 9.7  --   --  9.5 9.5 9.4  9.3  MG 2.7*  --  2.5*  --   --   --   --  2.3 2.5*  PHOS 3.3  --  2.9 3.8 4.0  --  4.3 3.8 4.1   < > = values in this interval not displayed.   GFR: Estimated Creatinine Clearance: 26.4 mL/min (A) (by C-G formula based on SCr of 1.5 mg/dL (H)). Liver Function Tests: Recent Labs  Lab 11/20/23 1945 11/21/23 0300 11/23/23 0449  AST 33 32 37  ALT 20 20 19   ALKPHOS 60 64 61  BILITOT 0.7 0.7 0.3  PROT 8.1 7.2 6.6  ALBUMIN  2.4* 2.3* 1.8*   No results for input(s): "LIPASE", "AMYLASE" in the last 168 hours. Recent Labs  Lab 11/20/23 2119  AMMONIA <13   Coagulation Profile: Recent Labs  Lab 11/20/23 2106  INR 1.4*   Cardiac Enzymes: Recent Labs  Lab 11/20/23 2119  CKTOTAL 21*   BNP (last 3 results) No results for input(s): "PROBNP" in the last 8760 hours. HbA1C: No results for input(s): "HGBA1C" in the last 72 hours. CBG: Recent Labs  Lab 11/23/23 1120 11/23/23 1536 11/23/23 1933 11/23/23 2347 11/24/23 0303  GLUCAP 131* 103* 123* 104* 117*   Lipid Profile: No results for input(s): "CHOL", "HDL", "LDLCALC", "TRIG", "CHOLHDL", "LDLDIRECT" in the last 72 hours. Thyroid  Function Tests: No results for input(s): "TSH", "T4TOTAL", "FREET4", "T3FREE", "THYROIDAB" in the last 72 hours.  Anemia Panel: Recent Labs    11/23/23 1441  VITAMINB12 402   Sepsis Labs: Recent Labs  Lab 11/20/23 2119 11/21/23 0032 11/21/23 0243 11/21/23 0300 11/21/23 0830  PROCALCITON 2.50  --   --   --   --   LATICACIDVEN  --  3.6* 3.2* 3.2* 1.9    Recent  Results (from the past 240 hours)  Resp panel by RT-PCR (RSV, Flu A&B, Covid) Anterior Nasal Swab     Status: None   Collection Time: 11/20/23  7:40 PM   Specimen: Anterior Nasal Swab  Result Value Ref Range Status   SARS Coronavirus 2 by RT PCR NEGATIVE NEGATIVE Final   Influenza A by PCR NEGATIVE NEGATIVE Final   Influenza B by PCR NEGATIVE NEGATIVE Final    Comment: (NOTE) The Xpert Xpress SARS-CoV-2/FLU/RSV plus assay is intended as an aid in the diagnosis of influenza from Nasopharyngeal swab specimens and should not be used as a sole basis for treatment. Nasal washings and aspirates are unacceptable for Xpert Xpress SARS-CoV-2/FLU/RSV testing.  Fact Sheet for Patients: BloggerCourse.com  Fact Sheet for Healthcare Providers: SeriousBroker.it  This test is not yet approved or cleared by the [place]  FDA and has been authorized for detection and/or diagnosis of SARS-CoV-2 by FDA under an Emergency Use Authorization (EUA). This EUA will remain in effect (meaning this test can be used) for the duration of the COVID-19 declaration under Section 564(b)(1) of the Act, 21 U.S.C. section 360bbb-3(b)(1), unless the authorization is terminated or revoked.     Resp Syncytial Virus by PCR NEGATIVE NEGATIVE Final    Comment: (NOTE) Fact Sheet for Patients: BloggerCourse.com  Fact Sheet for Healthcare Providers: SeriousBroker.it  This test is not yet approved or cleared by the [place]  FDA and has been authorized for detection and/or diagnosis of SARS-CoV-2 by FDA under an Emergency Use Authorization (EUA). This EUA will remain in effect (meaning this test can be used) for the duration of the COVID-19 declaration under Section 564(b)(1) of the Act, 21 U.S.C. section 360bbb-3(b)(1), unless the authorization is terminated or  revoked.  Performed at [name], [address], [place], [place] [address]   Blood culture (routine x 2)     Status: None (Preliminary result)   Collection Time: 11/20/23  8:52 PM   Specimen: BLOOD  Result Value Ref Range Status   Specimen Description BLOOD BLOOD RIGHT HAND  Final   Special Requests AEROBIC BOTTLE ONLY Blood Culture adequate volume  Final   Culture   Final    NO GROWTH 3 DAYS Performed at [facility], [address], [place], [place] [address]    Report Status PENDING  Incomplete  Blood culture (routine x 2)     Status: None (Preliminary result)   Collection Time: 11/20/23  9:06 PM   Specimen: BLOOD  Result Value Ref Range Status   Specimen Description BLOOD BLOOD LEFT HAND  Final   Special Requests AEROBIC BOTTLE ONLY Blood Culture adequate volume  Final   Culture   Final    NO GROWTH 3 DAYS Performed at [facility], [address], [place], [place] [address]    Report Status PENDING  Incomplete  Surgical pcr screen     Status: None   Collection Time: 11/21/23  3:17 PM   Specimen: Nasal Mucosa; Nasal Swab  Result Value Ref Range Status   MRSA, PCR NEGATIVE NEGATIVE Final   Staphylococcus aureus NEGATIVE NEGATIVE Final    Comment: (NOTE) The Xpert SA Assay (FDA approved for NASAL specimens in patients 62 years of age and older), is one component of a comprehensive surveillance program. It is not intended to diagnose infection nor to guide or monitor treatment. Performed at [facility], [address], [place], [place] [address]          Radiology Studies: DG CHEST PORT 1 VIEW Result Date: 11/23/2023 CLINICAL DATA:  Shortness of breath, sepsis EXAM: PORTABLE CHEST 1 VIEW COMPARISON:  11/20/2023 FINDINGS: Left PICC line tip in the SVC. Heart mediastinal contours within normal limits. Diffuse airspace disease throughout the left lung and in the right lower lobe. No effusions or pneumothorax. Chronic displaced left humeral neck fracture. No acute bony abnormality. IMPRESSION:  Diffuse airspace disease throughout the left lung and in the right lower lobe concerning for pneumonia. This is similar or slightly worsened since prior study. Electronically Signed   By: Janeece Mechanic M.D.   On: 11/23/2023 13:49   US  EKG SITE RITE Result Date: 11/22/2023 If Site Rite image not attached, placement could not be confirmed due to current cardiac rhythm.       Scheduled Meds:  Chlorhexidine  Gluconate Cloth  6 each Topical Daily   free water   200 mL Per Tube Q6H   levothyroxine   75 mcg Per Tube Q0600   lidocaine   1 patch Transdermal Q24H   sodium chloride  flush  10-40 mL Intracatheter Q12H   thiamine  (VITAMIN B1) injection  100 mg Intravenous Daily   Continuous Infusions:  ceFEPime  (MAXIPIME ) IV Stopped (11/23/23 2200)   feeding supplement (JEVITY 1.5 CAL/FIBER) 60 mL/hr at 11/24/23 2956          Audria Leather, MD [facility] 11/24/2023, 7:30 AM

## 2023-11-25 LAB — POTASSIUM: Potassium: 5.2 mmol/L — ABNORMAL HIGH (ref 3.5–5.1)

## 2023-11-25 LAB — GLUCOSE, CAPILLARY
Glucose-Capillary: 110 mg/dL — ABNORMAL HIGH (ref 70–99)
Glucose-Capillary: 113 mg/dL — ABNORMAL HIGH (ref 70–99)
Glucose-Capillary: 121 mg/dL — ABNORMAL HIGH (ref 70–99)
Glucose-Capillary: 125 mg/dL — ABNORMAL HIGH (ref 70–99)
Glucose-Capillary: 144 mg/dL — ABNORMAL HIGH (ref 70–99)
Glucose-Capillary: 154 mg/dL — ABNORMAL HIGH (ref 70–99)

## 2023-11-25 LAB — MAGNESIUM: Magnesium: 2.5 mg/dL — ABNORMAL HIGH (ref 1.7–2.4)

## 2023-11-25 LAB — CULTURE, BLOOD (ROUTINE X 2)
Culture: NO GROWTH
Culture: NO GROWTH
Special Requests: ADEQUATE
Special Requests: ADEQUATE

## 2023-11-25 LAB — PHOSPHORUS: Phosphorus: 4.6 mg/dL (ref 2.5–4.6)

## 2023-11-25 MED ORDER — SENNOSIDES-DOCUSATE SODIUM 8.6-50 MG PO TABS
1.0000 | ORAL_TABLET | Freq: Two times a day (BID) | ORAL | Status: DC
  Administered 2023-11-25 – 2023-11-27 (×4): 1 via ORAL
  Filled 2023-11-25 (×4): qty 1

## 2023-11-25 MED ORDER — ORAL CARE MOUTH RINSE
15.0000 mL | OROMUCOSAL | Status: DC
Start: 2023-11-25 — End: 2023-12-02
  Administered 2023-11-25 – 2023-12-02 (×26): 15 mL via OROMUCOSAL

## 2023-11-25 MED ORDER — ORAL CARE MOUTH RINSE
15.0000 mL | OROMUCOSAL | Status: DC | PRN
  Administered 2023-11-25: 15 mL via OROMUCOSAL

## 2023-11-25 NOTE — Progress Notes (Signed)
 Pt's PEG tube clogged, notified Felipe Horton, MD. See new orders.  @ 2157 this RN was able to unclog PEG tube with luke warm water . Pt tolerated well, feeding supplement restarted.   Sonjia Durie, RN

## 2023-11-25 NOTE — Progress Notes (Signed)
 PROGRESS NOTE    [name] [name]  ZHY:[id number] DOB: [birth year] DOA: 11/20/2023 PCP: Lawrance Presume, MD   Brief Narrative:  89 y.o. male with medical history significant of dementia, dysphagia status post PEG tube, hypertension, hyperlipidemia, hypothyroidism, gout, BPH presented with  fevers chills dysuria increased increased frequency in urination.  At baseline, patient is bedbound and moves throughout the day.  On presentation, he was mildly tachycardic and tachypneic with WBCs of 16.1, sodium of 160, potassium 5.7, creatinine 2.08, lactic acid of 7.4, troponin 149, procalcitonin 2.5.  UA negative for UTI.  Influenza/COVID/RSV PCR negative.  Chest x-ray consistent with residual or recurrent pneumonia versus aspiration.  He was started on broad-spectrum antibiotics.  Palliative care consulted.  Assessment & Plan:   Severe sepsis: Present on admission Possible aspiration pneumonia -Presented with leukocytosis, tachycardia, tachypnea, lactic acidosis possibly from aspiration pneumonia.  Patient is bedbound and has history of recurrent aspirations. - Continue cefepime .  Will do 7-day course of antibiotics.  Cultures negative so far.  DC'd vancomycin  on 11/22/2023. - Chest x-ray on 11/23/2023 showed possibly worsening bilateral pneumonia - No temperature spikes over the last 24 hours.  Acute kidney injury Dehydration -Improving.  Labs pending today.  Monitor  Hypernatremia - Sodium 160 on presentation.  Improving to 145 on 11/24/2023.  Labs pending today.  Continue free water  via tube.  Off IV fluids.  Failure to thrive in adult Goals of care Severe protein calorie malnutrition Dysphagia status post PEG tube placement History of dementia -Continue PEG tube feeding.  Patient possibly has reached to end of his life and prognosis is very poor.  Palliative care following.  Patient remains full code.  Elevated troponin - Did not trend upwards.  Possibly from demand ischemia.  No further workup  needed  Hyperkalemia - Mild.  Monitor.  Leukocytosis -Resolved  Anemia of chronic disease - From chronic illnesses.  Hemoglobin currently stable.  Monitor intermittently  Hypothyroidism -Continue Synthroid   Essential hypertension - Blood pressure stable.  Amlodipine  on hold    DVT prophylaxis: SCDs Code Status: Full Family Communication: None at bedside Disposition Plan: Status is: Inpatient Remains inpatient appropriate because: Of severity of illness    Consultants: Palliative care  Procedures: None  Antimicrobials:  Anti-infectives (From admission, onward)    Start     Dose/Rate Route Frequency Ordered Stop   11/21/23 2200  ceFEPIme  (MAXIPIME ) 2 g in sodium chloride  0.9 % 100 mL IVPB        2 g 200 mL/hr over 30 Minutes Intravenous Every 24 hours 11/20/23 2123 11/27/23 2159   11/20/23 2130  metroNIDAZOLE  (FLAGYL ) IVPB 500 mg  Status:  Discontinued        500 mg 100 mL/hr over 60 Minutes Intravenous Every 12 hours 11/20/23 2120 11/21/23 0826   11/20/23 2122  vancomycin  variable dose per unstable renal function (pharmacist dosing)  Status:  Discontinued         Does not apply See admin instructions 11/20/23 2123 11/22/23 1206   11/20/23 2115  ceFEPIme  (MAXIPIME ) 2 g in sodium chloride  0.9 % 100 mL IVPB        2 g 200 mL/hr over 30 Minutes Intravenous  Once 11/20/23 2100 11/20/23 2156   11/20/23 2115  Vancomycin  (VANCOCIN ) 1,250 mg in sodium chloride  0.9 % 250 mL IVPB        1,250 mg 166.7 mL/hr over 90 Minutes Intravenous  Once 11/20/23 2100 11/20/23 2300   11/20/23 2045  azithromycin  (ZITHROMAX ) 500 mg in sodium chloride   0.9 % 250 mL IVPB  Status:  Discontinued        500 mg 250 mL/hr over 60 Minutes Intravenous  Once 11/20/23 2039 11/20/23 2054   11/20/23 2045  cefTRIAXone  (ROCEPHIN ) 1 g in sodium chloride  0.9 % 100 mL IVPB  Status:  Discontinued        1 g 200 mL/hr over 30 Minutes Intravenous  Once 11/20/23 2039 11/20/23 2054         Subjective: Patient seen and examined at bedside.  Poor historian, continues to moan intermittently.  No seizures, vomiting, fever reported.   Objective: Vitals:   11/24/23 1616 11/24/23 1927 11/25/23 0028 11/25/23 0322  BP: (!) 131/58 123/71 128/66 129/65  Pulse:  98 98 100  Resp: (!) 31 (!) 26 17 20   Temp: 99.1 F (37.3 C) 97.6 F (36.4 C) 98.1 F (36.7 C) 98.2 F (36.8 C)  TempSrc: Axillary Oral Axillary Axillary  SpO2: 96% 99% 100% 100%  Weight:    52.3 kg  Height:        Intake/Output Summary (Last 24 hours) at 11/25/2023 0736 Last data filed at 11/24/2023 2100 Gross per 24 hour  Intake --  Output 1800 ml  Net -1800 ml   Filed Weights   11/23/23 0430 11/24/23 0304 11/25/23 0322  Weight: 52 kg 54.8 kg 52.3 kg    Examination:  General: On room air.  No distress.  Chronically ill and deconditioned looking.  Very thinly built. ENT/neck: No obvious neck masses or elevated JVD noted  respiratory: Decreased breath sounds at bases bilaterally with some crackles and intermittent tachypnea CVS: S1-S2 heard; remains intermittently tachycardic  abdominal: Soft, nontender, distended slightly; no organomegaly, normal bowel sounds heard.  PEG tube is present  extremities: Trace lower extremity edema present bilaterally; no cyanosis  CNS: Continues to moan intermittently.  Does not follow commands.  No obvious focal deficits noted  lymph: No palpable lymphadenopathy  skin: No obvious ecchymosis/lesions psych: Cannot assess because of his mental status musculoskeletal: No obvious joint swelling/tenderness    Data Reviewed: I have personally reviewed following labs and imaging studies  CBC: Recent Labs  Lab 11/20/23 1945 11/20/23 2151 11/21/23 0300 11/22/23 0900 11/23/23 0449 11/24/23 0300  WBC 16.1*  --  14.5* 7.6 7.9 8.8  NEUTROABS 14.5*  --   --   --  6.9 6.9  HGB 10.7* 16.0 9.6* 8.4* 8.6* 7.8*  HCT 37.1* 47.0 33.4* 28.1* 28.9* 26.6*  MCV 92.3  --  92.8 88.6  90.6 90.5  PLT 229  --  164 150 155 126*   Basic Metabolic Panel: Recent Labs  Lab 11/20/23 2119 11/20/23 2151 11/21/23 0300 11/21/23 1535 11/21/23 1721 11/21/23 1923 11/22/23 0900 11/23/23 0449 11/24/23 0300 11/25/23 0400  NA  --    < > 154*  --   --  148* 149* 147* 145  --   K  --    < > 5.0  --   --  4.7 4.8 4.6 5.0 5.2*  CL  --    < > 120*  --   --  117* 121* 116* 114*  --   CO2  --    < > 21*  --   --  24 23 24 24   --   GLUCOSE  --    < > 101*  --   --  98 103* 135* 127*  --   BUN  --    < > 104*  --   --  85*  83* 75* 73*  --   CREATININE  --    < > 1.69*  --   --  1.60* 1.61* 1.46* 1.50*  --   CALCIUM  --    < > 9.7  --   --  9.5 9.5 9.4 9.3  --   MG 2.7*  --  2.5*  --   --   --   --  2.3 2.5* 2.5*  PHOS 3.3  --  2.9   < > 4.0  --  4.3 3.8 4.1 4.6   < > = values in this interval not displayed.   GFR: Estimated Creatinine Clearance: 25.2 mL/min (A) (by C-G formula based on SCr of 1.5 mg/dL (H)). Liver Function Tests: Recent Labs  Lab 11/20/23 1945 11/21/23 0300 11/23/23 0449  AST 33 32 37  ALT 20 20 19   ALKPHOS 60 64 61  BILITOT 0.7 0.7 0.3  PROT 8.1 7.2 6.6  ALBUMIN  2.4* 2.3* 1.8*   No results for input(s): "LIPASE", "AMYLASE" in the last 168 hours. Recent Labs  Lab 11/20/23 2119  AMMONIA <13   Coagulation Profile: Recent Labs  Lab 11/20/23 2106  INR 1.4*   Cardiac Enzymes: Recent Labs  Lab 11/20/23 2119  CKTOTAL 21*   BNP (last 3 results) No results for input(s): "PROBNP" in the last 8760 hours. HbA1C: No results for input(s): "HGBA1C" in the last 72 hours. CBG: Recent Labs  Lab 11/24/23 1108 11/24/23 1615 11/24/23 1924 11/25/23 0031 11/25/23 0319  GLUCAP 119* 135* 135* 144* 154*   Lipid Profile: No results for input(s): "CHOL", "HDL", "LDLCALC", "TRIG", "CHOLHDL", "LDLDIRECT" in the last 72 hours. Thyroid  Function Tests: No results for input(s): "TSH", "T4TOTAL", "FREET4", "T3FREE", "THYROIDAB" in the last 72 hours.  Anemia  Panel: Recent Labs    11/23/23 1441  VITAMINB12 402   Sepsis Labs: Recent Labs  Lab 11/20/23 2119 11/21/23 0032 11/21/23 0243 11/21/23 0300 11/21/23 0830  PROCALCITON 2.50  --   --   --   --   LATICACIDVEN  --  3.6* 3.2* 3.2* 1.9    Recent Results (from the past 240 hours)  Resp panel by RT-PCR (RSV, Flu A&B, Covid) Anterior Nasal Swab     Status: None   Collection Time: 11/20/23  7:40 PM   Specimen: Anterior Nasal Swab  Result Value Ref Range Status   SARS Coronavirus 2 by RT PCR NEGATIVE NEGATIVE Final   Influenza A by PCR NEGATIVE NEGATIVE Final   Influenza B by PCR NEGATIVE NEGATIVE Final    Comment: (NOTE) The Xpert Xpress SARS-CoV-2/FLU/RSV plus assay is intended as an aid in the diagnosis of influenza from Nasopharyngeal swab specimens and should not be used as a sole basis for treatment. Nasal washings and aspirates are unacceptable for Xpert Xpress SARS-CoV-2/FLU/RSV testing.  Fact Sheet for Patients: BloggerCourse.com  Fact Sheet for Healthcare Providers: SeriousBroker.it  This test is not yet approved or cleared by the [place]  FDA and has been authorized for detection and/or diagnosis of SARS-CoV-2 by FDA under an Emergency Use Authorization (EUA). This EUA will remain in effect (meaning this test can be used) for the duration of the COVID-19 declaration under Section 564(b)(1) of the Act, 21 U.S.C. section 360bbb-3(b)(1), unless the authorization is terminated or revoked.     Resp Syncytial Virus by PCR NEGATIVE NEGATIVE Final    Comment: (NOTE) Fact Sheet for Patients: BloggerCourse.com  Fact Sheet for Healthcare Providers: SeriousBroker.it  This test is not yet approved or cleared by the  [place]  FDA and has been authorized for detection and/or diagnosis of SARS-CoV-2 by FDA under an Emergency Use Authorization (EUA). This EUA will  remain in effect (meaning this test can be used) for the duration of the COVID-19 declaration under Section 564(b)(1) of the Act, 21 U.S.C. section 360bbb-3(b)(1), unless the authorization is terminated or revoked.  Performed at [facility], [address] [place], [place] [address]   Blood culture (routine x 2)     Status: None (Preliminary result)   Collection Time: 11/20/23  8:52 PM   Specimen: BLOOD  Result Value Ref Range Status   Specimen Description BLOOD BLOOD RIGHT HAND  Final   Special Requests AEROBIC BOTTLE ONLY Blood Culture adequate volume  Final   Culture   Final    NO GROWTH 4 DAYS Performed at [facility], [address], [place], [place] [address]    Report Status PENDING  Incomplete  Blood culture (routine x 2)     Status: None (Preliminary result)   Collection Time: 11/20/23  9:06 PM   Specimen: BLOOD  Result Value Ref Range Status   Specimen Description BLOOD BLOOD LEFT HAND  Final   Special Requests AEROBIC BOTTLE ONLY Blood Culture adequate volume  Final   Culture   Final    NO GROWTH 4 DAYS Performed at [facility], [address], [place], [place] [address]    Report Status PENDING  Incomplete  Surgical pcr screen     Status: None   Collection Time: 11/21/23  3:17 PM   Specimen: Nasal Mucosa; Nasal Swab  Result Value Ref Range Status   MRSA, PCR NEGATIVE NEGATIVE Final   Staphylococcus aureus NEGATIVE NEGATIVE Final    Comment: (NOTE) The Xpert SA Assay (FDA approved for NASAL specimens in patients 55 years of age and older), is one component of a comprehensive surveillance program. It is not intended to diagnose infection nor to guide or monitor treatment. Performed at [facility], [address] [place], [place] [address]          Radiology Studies: DG ABDOMEN PEG TUBE LOCATION Result Date: 11/24/2023 CLINICAL DATA:  Peg tube EXAM: ABDOMEN - 1 VIEW COMPARISON:  10/01/2023 FINDINGS: Airspace disease at the  left base. Contrast is seen within the stomach. There is no gross extravasation. IMPRESSION: Contrast within the stomach without gross extravasation. Electronically Signed   By: Esmeralda Hedge M.D.   On: 11/24/2023 22:57   DG CHEST PORT 1 VIEW Result Date: 11/23/2023 CLINICAL DATA:  Shortness of breath, sepsis EXAM: PORTABLE CHEST 1 VIEW COMPARISON:  11/20/2023 FINDINGS: Left PICC line tip in the SVC. Heart mediastinal contours within normal limits. Diffuse airspace disease throughout the left lung and in the right lower lobe. No effusions or pneumothorax. Chronic displaced left humeral neck fracture. No acute bony abnormality. IMPRESSION: Diffuse airspace disease throughout the left lung and in the right lower lobe concerning for pneumonia. This is similar or slightly worsened since prior study. Electronically Signed   By: Janeece Mechanic M.D.   On: 11/23/2023 13:49        Scheduled Meds:  Chlorhexidine  Gluconate Cloth  6 each Topical Daily   free water   200 mL Per Tube Q6H   levothyroxine   75 mcg Per Tube Q0600   lidocaine   1 patch Transdermal Q24H   senna-docusate  1 tablet Oral BID   sodium bicarbonate   650 mg Per Tube Once   sodium chloride  flush  10-40 mL Intracatheter Q12H   thiamine  (VITAMIN B1) injection  100 mg Intravenous Daily   Continuous Infusions:  ceFEPime  (MAXIPIME ) IV 2 g (11/24/23 2157)   feeding supplement (JEVITY 1.5 CAL/FIBER) 60 mL/hr at 11/24/23 2157          Audria Leather, MD [facility] 11/25/2023, 7:36 AM

## 2023-11-25 NOTE — Plan of Care (Signed)

## 2023-11-25 NOTE — Plan of Care (Signed)
  Problem: Respiratory: Goal: Ability to maintain adequate ventilation will improve Outcome: Progressing   Problem: Activity: Goal: Risk for activity intolerance will decrease Outcome: Progressing   Problem: Nutrition: Goal: Adequate nutrition will be maintained Outcome: Progressing   Problem: Coping: Goal: Level of anxiety will decrease Outcome: Progressing   Problem: Elimination: Goal: Will not experience complications related to bowel motility Outcome: Progressing Goal: Will not experience complications related to urinary retention Outcome: Progressing   Problem: Pain Managment: Goal: General experience of comfort will improve and/or be controlled Outcome: Progressing   Problem: Safety: Goal: Ability to remain free from injury will improve Outcome: Progressing

## 2023-11-26 ENCOUNTER — Inpatient Hospital Stay (HOSPITAL_COMMUNITY): Payer: MEDICAID

## 2023-11-26 LAB — CBC WITH DIFFERENTIAL/PLATELET
Abs Immature Granulocytes: 0.07 10*3/uL (ref 0.00–0.07)
Basophils Absolute: 0 10*3/uL (ref 0.0–0.1)
Basophils Relative: 0 %
Eosinophils Absolute: 0.2 10*3/uL (ref 0.0–0.5)
Eosinophils Relative: 2 %
HCT: 26.4 % — ABNORMAL LOW (ref 39.0–52.0)
Hemoglobin: 8 g/dL — ABNORMAL LOW (ref 13.0–17.0)
Immature Granulocytes: 1 %
Lymphocytes Relative: 12 %
Lymphs Abs: 1.2 10*3/uL (ref 0.7–4.0)
MCH: 27.3 pg (ref 26.0–34.0)
MCHC: 30.3 g/dL (ref 30.0–36.0)
MCV: 90.1 fL (ref 80.0–100.0)
Monocytes Absolute: 0.6 10*3/uL (ref 0.1–1.0)
Monocytes Relative: 6 %
Neutro Abs: 8.5 10*3/uL — ABNORMAL HIGH (ref 1.7–7.7)
Neutrophils Relative %: 79 %
Platelets: 171 10*3/uL (ref 150–400)
RBC: 2.93 MIL/uL — ABNORMAL LOW (ref 4.22–5.81)
RDW: 18 % — ABNORMAL HIGH (ref 11.5–15.5)
WBC: 10.7 10*3/uL — ABNORMAL HIGH (ref 4.0–10.5)
nRBC: 0 % (ref 0.0–0.2)

## 2023-11-26 LAB — GLUCOSE, CAPILLARY
Glucose-Capillary: 114 mg/dL — ABNORMAL HIGH (ref 70–99)
Glucose-Capillary: 123 mg/dL — ABNORMAL HIGH (ref 70–99)
Glucose-Capillary: 127 mg/dL — ABNORMAL HIGH (ref 70–99)
Glucose-Capillary: 129 mg/dL — ABNORMAL HIGH (ref 70–99)
Glucose-Capillary: 129 mg/dL — ABNORMAL HIGH (ref 70–99)
Glucose-Capillary: 138 mg/dL — ABNORMAL HIGH (ref 70–99)

## 2023-11-26 LAB — BASIC METABOLIC PANEL WITH GFR
Anion gap: 7 (ref 5–15)
BUN: 83 mg/dL — ABNORMAL HIGH (ref 8–23)
CO2: 29 mmol/L (ref 22–32)
Calcium: 9.4 mg/dL (ref 8.9–10.3)
Chloride: 113 mmol/L — ABNORMAL HIGH (ref 98–111)
Creatinine, Ser: 1.5 mg/dL — ABNORMAL HIGH (ref 0.61–1.24)
GFR, Estimated: 45 mL/min — ABNORMAL LOW (ref >60)
Glucose, Bld: 137 mg/dL — ABNORMAL HIGH (ref 70–99)
Potassium: 5.2 mmol/L — ABNORMAL HIGH (ref 3.5–5.1)
Sodium: 149 mmol/L — ABNORMAL HIGH (ref 135–145)

## 2023-11-26 LAB — MAGNESIUM: Magnesium: 2.5 mg/dL — ABNORMAL HIGH (ref 1.7–2.4)

## 2023-11-26 MED ORDER — POLYETHYLENE GLYCOL 3350 17 G PO PACK
17.0000 g | PACK | Freq: Two times a day (BID) | ORAL | Status: DC | PRN
  Administered 2023-11-26: 17 g via ORAL
  Filled 2023-11-26: qty 1

## 2023-11-26 MED ORDER — FUROSEMIDE 10 MG/ML IJ SOLN
20.0000 mg | Freq: Once | INTRAMUSCULAR | Status: AC
  Administered 2023-11-26: 20 mg via INTRAVENOUS
  Filled 2023-11-26: qty 2

## 2023-11-26 NOTE — Progress Notes (Signed)
 PROGRESS NOTE    [name] [name]  VOZ:[id number] DOB: 02/04/1934 DOA: 11/20/2023 PCP: Lawrance Presume, MD   Brief Narrative:  89 y.o. male with medical history significant of dementia, dysphagia status post PEG tube, hypertension, hyperlipidemia, hypothyroidism, gout, BPH presented with  fevers chills dysuria increased increased frequency in urination.  At baseline, patient is bedbound and moves throughout the day.  On presentation, he was mildly tachycardic and tachypneic with WBCs of 16.1, sodium of 160, potassium 5.7, creatinine 2.08, lactic acid of 7.4, troponin 149, procalcitonin 2.5.  UA negative for UTI.  Influenza/COVID/RSV PCR negative.  Chest x-ray consistent with residual or recurrent pneumonia versus aspiration.  He was started on broad-spectrum antibiotics.  Palliative care consulted.  Assessment & Plan:   Severe sepsis: Present on admission Possible aspiration pneumonia Acute respiratory failure with hypoxia -Presented with leukocytosis, tachycardia, tachypnea, lactic acidosis possibly from aspiration pneumonia.  Patient is bedbound and has history of recurrent aspirations. - Continue cefepime .  Will do 7-day course of antibiotics.  Cultures negative so far.  DC'd vancomycin  on 11/22/2023. - Chest x-ray on 11/23/2023 showed possibly worsening bilateral pneumonia - No temperature spikes recently - Now requiring 4 L oxygen via nasal cannula.  Wean off as able.  Will repeat chest x-ray.  Acute kidney injury Dehydration -Improving.  Creatinine 1.5 today.  Monitor  Hypernatremia - Sodium 160 on presentation.  Sodium creeping up to 149 today. Continue free water  via tube.  Off IV fluids.  Failure to thrive in adult Goals of care Severe protein calorie malnutrition Dysphagia status post PEG tube placement History of dementia -Continue PEG tube feeding.  Patient possibly has reached to end of his life and prognosis is very poor.  Palliative care following.  Patient remains full  code.  Elevated troponin - Did not trend upwards.  Possibly from demand ischemia.  No further workup needed  Hyperkalemia - Mild.  Monitor.  Leukocytosis -Resolved  Anemia of chronic disease - From chronic illnesses.  Hemoglobin currently stable.  Monitor intermittently  Hypothyroidism -Continue Synthroid   Essential hypertension - Blood pressure stable.  Amlodipine  on hold    DVT prophylaxis: SCDs Code Status: Full Family Communication: None at bedside Disposition Plan: Status is: Inpatient Remains inpatient appropriate because: Of severity of illness    Consultants: Palliative care  Procedures: None  Antimicrobials:  Anti-infectives (From admission, onward)    Start     Dose/Rate Route Frequency Ordered Stop   11/21/23 2200  ceFEPIme  (MAXIPIME ) 2 g in sodium chloride  0.9 % 100 mL IVPB        2 g 200 mL/hr over 30 Minutes Intravenous Every 24 hours 11/20/23 2123 11/27/23 2159   11/20/23 2130  metroNIDAZOLE  (FLAGYL ) IVPB 500 mg  Status:  Discontinued        500 mg 100 mL/hr over 60 Minutes Intravenous Every 12 hours 11/20/23 2120 11/21/23 0826   11/20/23 2122  vancomycin  variable dose per unstable renal function (pharmacist dosing)  Status:  Discontinued         Does not apply See admin instructions 11/20/23 2123 11/22/23 1206   11/20/23 2115  ceFEPIme  (MAXIPIME ) 2 g in sodium chloride  0.9 % 100 mL IVPB        2 g 200 mL/hr over 30 Minutes Intravenous  Once 11/20/23 2100 11/20/23 2156   11/20/23 2115  Vancomycin  (VANCOCIN ) 1,250 mg in sodium chloride  0.9 % 250 mL IVPB        1,250 mg 166.7 mL/hr over 90 Minutes Intravenous  Once  11/20/23 2100 11/20/23 2300   11/20/23 2045  azithromycin  (ZITHROMAX ) 500 mg in sodium chloride  0.9 % 250 mL IVPB  Status:  Discontinued        500 mg 250 mL/hr over 60 Minutes Intravenous  Once 11/20/23 2039 11/20/23 2054   11/20/23 2045  cefTRIAXone  (ROCEPHIN ) 1 g in sodium chloride  0.9 % 100 mL IVPB  Status:  Discontinued        1  g 200 mL/hr over 30 Minutes Intravenous  Once 11/20/23 2039 11/20/23 2054        Subjective: Patient seen and examined at bedside.  Continues to have intermittent moaning.  Extremely poor historian.  No fever, agitation or vomiting reported. Objective: Vitals:   11/25/23 2000 11/26/23 0000 11/26/23 0323 11/26/23 0726  BP: (!) 121/52 134/68 (!) 124/53   Pulse: 94 99 95 100  Resp: 19 20 (!) 24 (!) 26  Temp: 98.8 F (37.1 C) 97.7 F (36.5 C) 97.6 F (36.4 C) 99.1 F (37.3 C)  TempSrc: Axillary Axillary Axillary Oral  SpO2: 100% 100% 100% 99%  Weight:   50.4 kg   Height:        Intake/Output Summary (Last 24 hours) at 11/26/2023 0800 Last data filed at 11/26/2023 0350 Gross per 24 hour  Intake 2323 ml  Output 1100 ml  Net 1223 ml   Filed Weights   11/24/23 0304 11/25/23 0322 11/26/23 0323  Weight: 54.8 kg 52.3 kg 50.4 kg    Examination:  General: No acute distress.  Currently on 4 L oxygen by nasal cannula.  Chronically ill and deconditioned looking.  Very thinly built. ENT/neck: No elevated JVD or palpable thyromegaly noted  respiratory: Bilateral decreased breath sounds at bases with scattered crackles and tachypnea CVS: Mild intermittent tachycardia; S1 and S2 are heard  abdominal: Soft, nontender, remains mildly distended; no organomegaly, bowel sounds are heard.  Has PEG tube extremities: No; mild lower extremity edema present CNS: Still does not follow commands.  Continues to moan intermittently.  No obvious focal deficits currently  lymph: No lymphadenopathy palpable skin: No obvious lesions/petechiae  psych: Still unable to assess because of his mental status musculoskeletal: No obvious joint erythema/swelling    Data Reviewed: I have personally reviewed following labs and imaging studies  CBC: Recent Labs  Lab 11/20/23 1945 11/20/23 2151 11/21/23 0300 11/22/23 0900 11/23/23 0449 11/24/23 0300 11/26/23 0326  WBC 16.1*  --  14.5* 7.6 7.9 8.8 10.7*   NEUTROABS 14.5*  --   --   --  6.9 6.9 8.5*  HGB 10.7*   < > 9.6* 8.4* 8.6* 7.8* 8.0*  HCT 37.1*   < > 33.4* 28.1* 28.9* 26.6* 26.4*  MCV 92.3  --  92.8 88.6 90.6 90.5 90.1  PLT 229  --  164 150 155 126* 171   < > = values in this interval not displayed.   Basic Metabolic Panel: Recent Labs  Lab 11/21/23 0300 11/21/23 1535 11/21/23 1721 11/21/23 1923 11/22/23 0900 11/23/23 0449 11/24/23 0300 11/25/23 0400 11/26/23 0326  NA 154*  --   --  148* 149* 147* 145  --  149*  K 5.0  --   --  4.7 4.8 4.6 5.0 5.2* 5.2*  CL 120*  --   --  117* 121* 116* 114*  --  113*  CO2 21*  --   --  24 23 24 24   --  29  GLUCOSE 101*  --   --  98 103* 135* 127*  --  137*  BUN 104*  --   --  85* 83* 75* 73*  --  83*  CREATININE 1.69*  --   --  1.60* 1.61* 1.46* 1.50*  --  1.50*  CALCIUM 9.7  --   --  9.5 9.5 9.4 9.3  --  9.4  MG 2.5*  --   --   --   --  2.3 2.5* 2.5* 2.5*  PHOS 2.9   < > 4.0  --  4.3 3.8 4.1 4.6  --    < > = values in this interval not displayed.   GFR: Estimated Creatinine Clearance: 24.3 mL/min (A) (by C-G formula based on SCr of 1.5 mg/dL (H)). Liver Function Tests: Recent Labs  Lab 11/20/23 1945 11/21/23 0300 11/23/23 0449  AST 33 32 37  ALT 20 20 19   ALKPHOS 60 64 61  BILITOT 0.7 0.7 0.3  PROT 8.1 7.2 6.6  ALBUMIN  2.4* 2.3* 1.8*   No results for input(s): "LIPASE", "AMYLASE" in the last 168 hours. Recent Labs  Lab 11/20/23 2119  AMMONIA <13   Coagulation Profile: Recent Labs  Lab 11/20/23 2106  INR 1.4*   Cardiac Enzymes: Recent Labs  Lab 11/20/23 2119  CKTOTAL 21*   BNP (last 3 results) No results for input(s): "PROBNP" in the last 8760 hours. HbA1C: No results for input(s): "HGBA1C" in the last 72 hours. CBG: Recent Labs  Lab 11/25/23 1522 11/25/23 1924 11/26/23 0008 11/26/23 0322 11/26/23 0728  GLUCAP 125* 110* 129* 114* 129*   Lipid Profile: No results for input(s): "CHOL", "HDL", "LDLCALC", "TRIG", "CHOLHDL", "LDLDIRECT" in the last 72  hours. Thyroid  Function Tests: No results for input(s): "TSH", "T4TOTAL", "FREET4", "T3FREE", "THYROIDAB" in the last 72 hours.  Anemia Panel: Recent Labs    11/23/23 1441  VITAMINB12 402   Sepsis Labs: Recent Labs  Lab 11/20/23 2119 11/21/23 0032 11/21/23 0243 11/21/23 0300 11/21/23 0830  PROCALCITON 2.50  --   --   --   --   LATICACIDVEN  --  3.6* 3.2* 3.2* 1.9    Recent Results (from the past 240 hours)  Resp panel by RT-PCR (RSV, Flu A&B, Covid) Anterior Nasal Swab     Status: None   Collection Time: 11/20/23  7:40 PM   Specimen: Anterior Nasal Swab  Result Value Ref Range Status   SARS Coronavirus 2 by RT PCR NEGATIVE NEGATIVE Final   Influenza A by PCR NEGATIVE NEGATIVE Final   Influenza B by PCR NEGATIVE NEGATIVE Final    Comment: (NOTE) The Xpert Xpress SARS-CoV-2/FLU/RSV plus assay is intended as an aid in the diagnosis of influenza from Nasopharyngeal swab specimens and should not be used as a sole basis for treatment. Nasal washings and aspirates are unacceptable for Xpert Xpress SARS-CoV-2/FLU/RSV testing.  Fact Sheet for Patients: BloggerCourse.com  Fact Sheet for Healthcare Providers: SeriousBroker.it  This test is not yet approved or cleared by the [place]  FDA and has been authorized for detection and/or diagnosis of SARS-CoV-2 by FDA under an Emergency Use Authorization (EUA). This EUA will remain in effect (meaning this test can be used) for the duration of the COVID-19 declaration under Section 564(b)(1) of the Act, 21 U.S.C. section 360bbb-3(b)(1), unless the authorization is terminated or revoked.     Resp Syncytial Virus by PCR NEGATIVE NEGATIVE Final    Comment: (NOTE) Fact Sheet for Patients: BloggerCourse.com  Fact Sheet for Healthcare Providers: SeriousBroker.it  This test is not yet approved or cleared by the [place]  FDA  and has been authorized for detection and/or diagnosis of SARS-CoV-2 by FDA under an Emergency Use Authorization (EUA). This EUA will remain in effect (meaning this test can be used) for the duration of the COVID-19 declaration under Section 564(b)(1) of the Act, 21 U.S.C. section 360bbb-3(b)(1), unless the authorization is terminated or revoked.  Performed at [facility], [address], [place], [place] [address]   Blood culture (routine x 2)     Status: None   Collection Time: 11/20/23  8:52 PM   Specimen: BLOOD  Result Value Ref Range Status   Specimen Description BLOOD BLOOD RIGHT HAND  Final   Special Requests AEROBIC BOTTLE ONLY Blood Culture adequate volume  Final   Culture   Final    NO GROWTH 5 DAYS Performed at [facility], [address], [place], [place] [address]    Report Status 11/25/2023 FINAL  Final  Blood culture (routine x 2)     Status: None   Collection Time: 11/20/23  9:06 PM   Specimen: BLOOD  Result Value Ref Range Status   Specimen Description BLOOD BLOOD LEFT HAND  Final   Special Requests AEROBIC BOTTLE ONLY Blood Culture adequate volume  Final   Culture   Final    NO GROWTH 5 DAYS Performed at [name], [address] [place], [place] [address]    Report Status 11/25/2023 FINAL  Final  Surgical pcr screen     Status: None   Collection Time: 11/21/23  3:17 PM   Specimen: Nasal Mucosa; Nasal Swab  Result Value Ref Range Status   MRSA, PCR NEGATIVE NEGATIVE Final   Staphylococcus aureus NEGATIVE NEGATIVE Final    Comment: (NOTE) The Xpert SA Assay (FDA approved for NASAL specimens in patients 58 years of age and older), is one component of a comprehensive surveillance program. It is not intended to diagnose infection nor to guide or monitor treatment. Performed at [facility], [address], [place], [place] [address]          Radiology Studies: DG ABDOMEN PEG TUBE LOCATION Result Date:  11/24/2023 CLINICAL DATA:  Peg tube EXAM: ABDOMEN - 1 VIEW COMPARISON:  10/01/2023 FINDINGS: Airspace disease at the left base. Contrast is seen within the stomach. There is no gross extravasation. IMPRESSION: Contrast within the stomach without gross extravasation. Electronically Signed   By: Esmeralda Hedge M.D.   On: 11/24/2023 22:57        Scheduled Meds:  Chlorhexidine  Gluconate Cloth  6 each Topical Daily   free water   200 mL Per Tube Q6H   levothyroxine   75 mcg Per Tube Q0600   lidocaine   1 patch Transdermal Q24H   mouth rinse  15 mL Mouth Rinse 4 times per day   senna-docusate  1 tablet Oral BID   sodium bicarbonate   650 mg Per Tube Once   sodium chloride  flush  10-40 mL Intracatheter Q12H   thiamine  (VITAMIN B1) injection  100 mg Intravenous Daily   Continuous Infusions:  ceFEPime  (MAXIPIME ) IV 2 g (11/25/23 2139)   feeding supplement (JEVITY 1.5 CAL/FIBER) 60 mL/hr at 11/26/23 0350          Audria Leather, MD [facility] 11/26/2023, 8:00 AM

## 2023-11-26 NOTE — Progress Notes (Signed)
 [facility] night cross cover note:   I was notified by the patient's RN that the patient is complaining of constipation, noting most recent bowel movement occurred prior to admission.  No bowel movement during this hospitalization in spite of Senokot scheduled bid.   I subsequently added MiraLAX  twice daily as needed for constipation.     Camelia Cavalier, DO Hospitalist

## 2023-11-26 DEATH — deceased

## 2023-11-27 ENCOUNTER — Inpatient Hospital Stay (HOSPITAL_COMMUNITY): Payer: MEDICAID

## 2023-11-27 DIAGNOSIS — J189 Pneumonia, unspecified organism: Principal | ICD-10-CM

## 2023-11-27 DIAGNOSIS — R1310 Dysphagia, unspecified: Secondary | ICD-10-CM

## 2023-11-27 LAB — CBC WITH DIFFERENTIAL/PLATELET
Abs Immature Granulocytes: 0.1 10*3/uL — ABNORMAL HIGH (ref 0.00–0.07)
Basophils Absolute: 0 10*3/uL (ref 0.0–0.1)
Basophils Relative: 0 %
Eosinophils Absolute: 0.3 10*3/uL (ref 0.0–0.5)
Eosinophils Relative: 2 %
HCT: 27.6 % — ABNORMAL LOW (ref 39.0–52.0)
Hemoglobin: 8.1 g/dL — ABNORMAL LOW (ref 13.0–17.0)
Immature Granulocytes: 1 %
Lymphocytes Relative: 10 %
Lymphs Abs: 1.3 10*3/uL (ref 0.7–4.0)
MCH: 27 pg (ref 26.0–34.0)
MCHC: 29.3 g/dL — ABNORMAL LOW (ref 30.0–36.0)
MCV: 92 fL (ref 80.0–100.0)
Monocytes Absolute: 0.6 10*3/uL (ref 0.1–1.0)
Monocytes Relative: 5 %
Neutro Abs: 9.7 10*3/uL — ABNORMAL HIGH (ref 1.7–7.7)
Neutrophils Relative %: 82 %
Platelets: 237 10*3/uL (ref 150–400)
RBC: 3 MIL/uL — ABNORMAL LOW (ref 4.22–5.81)
RDW: 17.9 % — ABNORMAL HIGH (ref 11.5–15.5)
WBC: 12 10*3/uL — ABNORMAL HIGH (ref 4.0–10.5)
nRBC: 0 % (ref 0.0–0.2)

## 2023-11-27 LAB — MAGNESIUM: Magnesium: 2.5 mg/dL — ABNORMAL HIGH (ref 1.7–2.4)

## 2023-11-27 LAB — BLOOD GAS, ARTERIAL
Acid-Base Excess: 9.7 mmol/L — ABNORMAL HIGH (ref 0.0–2.0)
Bicarbonate: 34.3 mmol/L — ABNORMAL HIGH (ref 20.0–28.0)
O2 Saturation: 99.5 %
Patient temperature: 37
pCO2 arterial: 45 mmHg (ref 32–48)
pH, Arterial: 7.49 — ABNORMAL HIGH (ref 7.35–7.45)
pO2, Arterial: 64 mmHg — ABNORMAL LOW (ref 83–108)

## 2023-11-27 LAB — COMPREHENSIVE METABOLIC PANEL WITH GFR
ALT: 32 U/L (ref 0–44)
AST: 36 U/L (ref 15–41)
Albumin: 1.6 g/dL — ABNORMAL LOW (ref 3.5–5.0)
Alkaline Phosphatase: 81 U/L (ref 38–126)
Anion gap: 11 (ref 5–15)
BUN: 91 mg/dL — ABNORMAL HIGH (ref 8–23)
CO2: 28 mmol/L (ref 22–32)
Calcium: 9.6 mg/dL (ref 8.9–10.3)
Chloride: 111 mmol/L (ref 98–111)
Creatinine, Ser: 1.62 mg/dL — ABNORMAL HIGH (ref 0.61–1.24)
GFR, Estimated: 41 mL/min — ABNORMAL LOW (ref >60)
Glucose, Bld: 121 mg/dL — ABNORMAL HIGH (ref 70–99)
Potassium: 5.9 mmol/L — ABNORMAL HIGH (ref 3.5–5.1)
Sodium: 150 mmol/L — ABNORMAL HIGH (ref 135–145)
Total Bilirubin: 0.6 mg/dL (ref 0.0–1.2)
Total Protein: 6.6 g/dL (ref 6.5–8.1)

## 2023-11-27 LAB — GLUCOSE, CAPILLARY
Glucose-Capillary: 104 mg/dL — ABNORMAL HIGH (ref 70–99)
Glucose-Capillary: 105 mg/dL — ABNORMAL HIGH (ref 70–99)
Glucose-Capillary: 122 mg/dL — ABNORMAL HIGH (ref 70–99)
Glucose-Capillary: 122 mg/dL — ABNORMAL HIGH (ref 70–99)
Glucose-Capillary: 89 mg/dL (ref 70–99)
Glucose-Capillary: 91 mg/dL (ref 70–99)
Glucose-Capillary: 97 mg/dL (ref 70–99)

## 2023-11-27 LAB — PROCALCITONIN: Procalcitonin: 0.9 ng/mL

## 2023-11-27 MED ORDER — SCOPOLAMINE 1 MG/3DAYS TD PT72
1.0000 | MEDICATED_PATCH | TRANSDERMAL | Status: DC
  Administered 2023-11-27: 1.5 mg via TRANSDERMAL
  Filled 2023-11-27 (×2): qty 1

## 2023-11-27 MED ORDER — SODIUM CHLORIDE 0.9 % IV SOLN: INTRAVENOUS | Status: DC

## 2023-11-27 MED ORDER — GLYCOPYRROLATE 1 MG PO TABS
1.0000 mg | ORAL_TABLET | Freq: Two times a day (BID) | ORAL | Status: DC
  Administered 2023-11-27 (×2): 1 mg
  Filled 2023-11-27 (×3): qty 1

## 2023-11-27 MED ORDER — SENNOSIDES-DOCUSATE SODIUM 8.6-50 MG PO TABS
1.0000 | ORAL_TABLET | Freq: Two times a day (BID) | ORAL | Status: DC
  Administered 2023-11-27 – 2023-12-06 (×16): 1
  Filled 2023-11-27 (×17): qty 1

## 2023-11-27 MED ORDER — SODIUM ZIRCONIUM CYCLOSILICATE 10 G PO PACK
10.0000 g | PACK | Freq: Once | ORAL | Status: AC
  Administered 2023-11-27: 10 g
  Filled 2023-11-27: qty 1

## 2023-11-27 MED ORDER — SODIUM CHLORIDE 0.9 % IV SOLN
2.0000 g | INTRAVENOUS | Status: DC
  Administered 2023-11-27: 2 g via INTRAVENOUS
  Filled 2023-11-27: qty 12.5

## 2023-11-27 NOTE — Progress Notes (Signed)
 Pt PEG tube clogged- able to get it unclogged with warm water  and "milking'" the tube. Changed the endfit connection

## 2023-11-27 NOTE — Progress Notes (Signed)
 [facility] night cross cover note:   I was notified by the patient's RN of the patient's recurrent secretions in the back of his throat. These secretions are amenable to suctioning, but continue to recur. Will add scopolamine patch at this time.     Camelia Cavalier, DO Hospitalist

## 2023-11-27 NOTE — Progress Notes (Signed)
 PROGRESS NOTE    [name] [name]  ZOX:[id number] DOB: 1934/01/02 DOA: 11/20/2023 PCP: Lawrance Presume, MD   Brief Narrative:  89 y.o. male with medical history significant of dementia, dysphagia status post PEG tube, hypertension, hyperlipidemia, hypothyroidism, gout, BPH presented with  fevers chills dysuria increased increased frequency in urination.  At baseline, patient is bedbound and moves throughout the day.  On presentation, he was mildly tachycardic and tachypneic with WBCs of 16.1, sodium of 160, potassium 5.7, creatinine 2.08, lactic acid of 7.4, troponin 149, procalcitonin 2.5.  UA negative for UTI.  Influenza/COVID/RSV PCR negative.  Chest x-ray consistent with residual or recurrent pneumonia versus aspiration.  He was started on broad-spectrum antibiotics.  Palliative care consulted. Patient remains full code.  Assessment & Plan:   Severe sepsis: Present on admission Possible aspiration pneumonia Acute respiratory failure with hypoxia -Presented with leukocytosis, tachycardia, tachypnea, lactic acidosis possibly from aspiration pneumonia.  Patient is bedbound and has history of recurrent aspirations. - Continue cefepime .  Will do 7-day course of antibiotics at least.  Cultures negative so far.  DC'd vancomycin  on 11/22/2023. - Chest x-ray on 11/23/2023 showed possibly worsening bilateral pneumonia - No temperature spikes recently - Currently on 1 L oxygen via nasal cannula.  Wean off as able.  Chest x-ray repeated on 11/26/2023 showed stable appearance of the chest from the previous exam.  Acute kidney injury Dehydration -Creatinine pending today.  Monitor  Hypernatremia - Sodium 160 on presentation.  Sodium pending today. Continue free water  via tube.  Off IV fluids.  Failure to thrive in adult Goals of care Severe protein calorie malnutrition Dysphagia status post PEG tube placement History of dementia -Continue PEG tube feeding.  Patient possibly has reached to end of his  life and prognosis is very poor.  Palliative care following.  Patient remains full code.  Elevated troponin - Did not trend upwards.  Possibly from demand ischemia.  No further workup needed  Hyperkalemia - Labs pending today.  Monitor  Leukocytosis -Resolved  Anemia of chronic disease - From chronic illnesses.  Hemoglobin currently stable.  Monitor intermittently  Hypothyroidism -Continue Synthroid   Essential hypertension - Blood pressure stable.  Amlodipine  on hold    DVT prophylaxis: SCDs Code Status: Full Family Communication: None at bedside Disposition Plan: Status is: Inpatient Remains inpatient appropriate because: Of severity of illness    Consultants: Palliative care  Procedures: None  Antimicrobials:  Anti-infectives (From admission, onward)    Start     Dose/Rate Route Frequency Ordered Stop   11/21/23 2200  ceFEPIme  (MAXIPIME ) 2 g in sodium chloride  0.9 % 100 mL IVPB        2 g 200 mL/hr over 30 Minutes Intravenous Every 24 hours 11/20/23 2123 11/27/23 2159   11/20/23 2130  metroNIDAZOLE  (FLAGYL ) IVPB 500 mg  Status:  Discontinued        500 mg 100 mL/hr over 60 Minutes Intravenous Every 12 hours 11/20/23 2120 11/21/23 0826   11/20/23 2122  vancomycin  variable dose per unstable renal function (pharmacist dosing)  Status:  Discontinued         Does not apply See admin instructions 11/20/23 2123 11/22/23 1206   11/20/23 2115  ceFEPIme  (MAXIPIME ) 2 g in sodium chloride  0.9 % 100 mL IVPB        2 g 200 mL/hr over 30 Minutes Intravenous  Once 11/20/23 2100 11/20/23 2156   11/20/23 2115  Vancomycin  (VANCOCIN ) 1,250 mg in sodium chloride  0.9 % 250 mL IVPB  1,250 mg 166.7 mL/hr over 90 Minutes Intravenous  Once 11/20/23 2100 11/20/23 2300   11/20/23 2045  azithromycin  (ZITHROMAX ) 500 mg in sodium chloride  0.9 % 250 mL IVPB  Status:  Discontinued        500 mg 250 mL/hr over 60 Minutes Intravenous  Once 11/20/23 2039 11/20/23 2054   11/20/23 2045   cefTRIAXone  (ROCEPHIN ) 1 g in sodium chloride  0.9 % 100 mL IVPB  Status:  Discontinued        1 g 200 mL/hr over 30 Minutes Intravenous  Once 11/20/23 2039 11/20/23 2054        Subjective: Patient seen and examined at bedside.  Very poor historian; does not follow commands.  No agitation, fever or vomiting reported Objective: Vitals:   11/27/23 0327 11/27/23 0350 11/27/23 0351 11/27/23 0411  BP: (!) 125/58     Pulse: (!) 115 97 93   Resp: 16 (!) 21 19   Temp: 97.9 F (36.6 C)     TempSrc: Axillary     SpO2: 98% 98% 98%   Weight:    52.4 kg  Height:        Intake/Output Summary (Last 24 hours) at 11/27/2023 0723 Last data filed at 11/27/2023 0406 Gross per 24 hour  Intake 110 ml  Output 1475 ml  Net -1365 ml   Filed Weights   11/25/23 0322 11/26/23 0323 11/27/23 0411  Weight: 52.3 kg 50.4 kg 52.4 kg    Examination:  General: On 1 L oxygen via nasal cannula.  No distress.  Chronically ill and deconditioned looking.  Very thinly built. ENT/neck: No JVD elevation or palpable neck masses noted respiratory: Decreased breath sounds with wheezes bilaterally with tachypnea and some scattered crackles CVS: S1-S2 heard; currently rate controlled abdominal: Soft, nontender, distended; no organomegaly, bowel sounds heard.  Active present.   Extremities: Trace lower extremity edema; no clubbing  CNS: Does not follow commands.  Moans intermittently.   Lymph: No cervical lymphadenopathy palpable skin: No obvious lesions/ecchymosis  psych: Unable to assess due to mental status  musculoskeletal: No obvious joint tenderness/swelling   Data Reviewed: I have personally reviewed following labs and imaging studies  CBC: Recent Labs  Lab 11/20/23 1945 11/20/23 2151 11/21/23 0300 11/22/23 0900 11/23/23 0449 11/24/23 0300 11/26/23 0326  WBC 16.1*  --  14.5* 7.6 7.9 8.8 10.7*  NEUTROABS 14.5*  --   --   --  6.9 6.9 8.5*  HGB 10.7*   < > 9.6* 8.4* 8.6* 7.8* 8.0*  HCT 37.1*   < >  33.4* 28.1* 28.9* 26.6* 26.4*  MCV 92.3  --  92.8 88.6 90.6 90.5 90.1  PLT 229  --  164 150 155 126* 171   < > = values in this interval not displayed.   Basic Metabolic Panel: Recent Labs  Lab 11/21/23 0300 11/21/23 1535 11/21/23 1721 11/21/23 1923 11/22/23 0900 11/23/23 0449 11/24/23 0300 11/25/23 0400 11/26/23 0326  NA 154*  --   --  148* 149* 147* 145  --  149*  K 5.0  --   --  4.7 4.8 4.6 5.0 5.2* 5.2*  CL 120*  --   --  117* 121* 116* 114*  --  113*  CO2 21*  --   --  24 23 24 24   --  29  GLUCOSE 101*  --   --  98 103* 135* 127*  --  137*  BUN 104*  --   --  85* 83* 75* 73*  --  83*  CREATININE 1.69*  --   --  1.60* 1.61* 1.46* 1.50*  --  1.50*  CALCIUM 9.7  --   --  9.5 9.5 9.4 9.3  --  9.4  MG 2.5*  --   --   --   --  2.3 2.5* 2.5* 2.5*  PHOS 2.9   < > 4.0  --  4.3 3.8 4.1 4.6  --    < > = values in this interval not displayed.   GFR: Estimated Creatinine Clearance: 25.2 mL/min (A) (by C-G formula based on SCr of 1.5 mg/dL (H)). Liver Function Tests: Recent Labs  Lab 11/20/23 1945 11/21/23 0300 11/23/23 0449  AST 33 32 37  ALT 20 20 19   ALKPHOS 60 64 61  BILITOT 0.7 0.7 0.3  PROT 8.1 7.2 6.6  ALBUMIN  2.4* 2.3* 1.8*   No results for input(s): "LIPASE", "AMYLASE" in the last 168 hours. Recent Labs  Lab 11/20/23 2119  AMMONIA <13   Coagulation Profile: Recent Labs  Lab 11/20/23 2106  INR 1.4*   Cardiac Enzymes: Recent Labs  Lab 11/20/23 2119  CKTOTAL 21*   BNP (last 3 results) No results for input(s): "PROBNP" in the last 8760 hours. HbA1C: No results for input(s): "HGBA1C" in the last 72 hours. CBG: Recent Labs  Lab 11/26/23 1152 11/26/23 1554 11/26/23 2003 11/27/23 0005 11/27/23 0402  GLUCAP 127* 138* 123* 97 122*   Lipid Profile: No results for input(s): "CHOL", "HDL", "LDLCALC", "TRIG", "CHOLHDL", "LDLDIRECT" in the last 72 hours. Thyroid  Function Tests: No results for input(s): "TSH", "T4TOTAL", "FREET4", "T3FREE", "THYROIDAB"  in the last 72 hours.  Anemia Panel: No results for input(s): "VITAMINB12", "FOLATE", "FERRITIN", "TIBC", "IRON ", "RETICCTPCT" in the last 72 hours.  Sepsis Labs: Recent Labs  Lab 11/20/23 2119 11/21/23 0032 11/21/23 0243 11/21/23 0300 11/21/23 0830  PROCALCITON 2.50  --   --   --   --   LATICACIDVEN  --  3.6* 3.2* 3.2* 1.9    Recent Results (from the past 240 hours)  Resp panel by RT-PCR (RSV, Flu A&B, Covid) Anterior Nasal Swab     Status: None   Collection Time: 11/20/23  7:40 PM   Specimen: Anterior Nasal Swab  Result Value Ref Range Status   SARS Coronavirus 2 by RT PCR NEGATIVE NEGATIVE Final   Influenza A by PCR NEGATIVE NEGATIVE Final   Influenza B by PCR NEGATIVE NEGATIVE Final    Comment: (NOTE) The Xpert Xpress SARS-CoV-2/FLU/RSV plus assay is intended as an aid in the diagnosis of influenza from Nasopharyngeal swab specimens and should not be used as a sole basis for treatment. Nasal washings and aspirates are unacceptable for Xpert Xpress SARS-CoV-2/FLU/RSV testing.  Fact Sheet for Patients: BloggerCourse.com  Fact Sheet for Healthcare Providers: SeriousBroker.it  This test is not yet approved or cleared by the [place]  FDA and has been authorized for detection and/or diagnosis of SARS-CoV-2 by FDA under an Emergency Use Authorization (EUA). This EUA will remain in effect (meaning this test can be used) for the duration of the COVID-19 declaration under Section 564(b)(1) of the Act, 21 U.S.C. section 360bbb-3(b)(1), unless the authorization is terminated or revoked.     Resp Syncytial Virus by PCR NEGATIVE NEGATIVE Final    Comment: (NOTE) Fact Sheet for Patients: BloggerCourse.com  Fact Sheet for Healthcare Providers: SeriousBroker.it  This test is not yet approved or cleared by the [place]  FDA and has been authorized for detection and/or  diagnosis of SARS-CoV-2 by FDA under  an Emergency Use Authorization (EUA). This EUA will remain in effect (meaning this test can be used) for the duration of the COVID-19 declaration under Section 564(b)(1) of the Act, 21 U.S.C. section 360bbb-3(b)(1), unless the authorization is terminated or revoked.  Performed at [facility], [address], [place], [place] [address]   Blood culture (routine x 2)     Status: None   Collection Time: 11/20/23  8:52 PM   Specimen: BLOOD  Result Value Ref Range Status   Specimen Description BLOOD BLOOD RIGHT HAND  Final   Special Requests AEROBIC BOTTLE ONLY Blood Culture adequate volume  Final   Culture   Final    NO GROWTH 5 DAYS Performed at [name], [address], [place], [place] [address]    Report Status 11/25/2023 FINAL  Final  Blood culture (routine x 2)     Status: None   Collection Time: 11/20/23  9:06 PM   Specimen: BLOOD  Result Value Ref Range Status   Specimen Description BLOOD BLOOD LEFT HAND  Final   Special Requests AEROBIC BOTTLE ONLY Blood Culture adequate volume  Final   Culture   Final    NO GROWTH 5 DAYS Performed at [facility], [address], [place], [place] [address]    Report Status 11/25/2023 FINAL  Final  Surgical pcr screen     Status: None   Collection Time: 11/21/23  3:17 PM   Specimen: Nasal Mucosa; Nasal Swab  Result Value Ref Range Status   MRSA, PCR NEGATIVE NEGATIVE Final   Staphylococcus aureus NEGATIVE NEGATIVE Final    Comment: (NOTE) The Xpert SA Assay (FDA approved for NASAL specimens in patients 79 years of age and older), is one component of a comprehensive surveillance program. It is not intended to diagnose infection nor to guide or monitor treatment. Performed at [facility], [address], [place], [place] [address]          Radiology Studies: DG CHEST PORT 1 VIEW Result Date: 11/26/2023 CLINICAL DATA:  Dyspnea EXAM: PORTABLE CHEST 1 VIEW  COMPARISON:  11/23/2023 FINDINGS: Cardiac shadow is stable. Persistent airspace opacity is noted throughout the left lung and right base stable from the prior exam. No sizable effusion is seen. Left PICC is noted. IMPRESSION: Stable appearance of the chest from the previous exam. Electronically Signed   By: Violeta Grey M.D.   On: 11/26/2023 09:49        Scheduled Meds:  Chlorhexidine  Gluconate Cloth  6 each Topical Daily   free water   200 mL Per Tube Q6H   levothyroxine   75 mcg Per Tube Q0600   lidocaine   1 patch Transdermal Q24H   mouth rinse  15 mL Mouth Rinse 4 times per day   scopolamine  1 patch Transdermal Q72H   senna-docusate  1 tablet Per Tube BID   sodium bicarbonate   650 mg Per Tube Once   sodium chloride  flush  10-40 mL Intracatheter Q12H   thiamine  (VITAMIN B1) injection  100 mg Intravenous Daily   Continuous Infusions:  ceFEPime  (MAXIPIME ) IV 2 g (11/26/23 2254)   feeding supplement (JEVITY 1.5 CAL/FIBER) 1,000 mL (11/27/23 2956)          Audria Leather, MD [facility] 11/27/2023, 7:23 AM

## 2023-11-27 NOTE — Progress Notes (Signed)
 RT placed nasal trumpet in pt's left nare per order for as needed Nasotracheal suctioning. ABG also collected and sent to lab.

## 2023-11-27 NOTE — Consult Note (Addendum)
 WOC Nurse Consult Note: patient is known to WOC team from previous admission with Stage 3 Pressure Injury L trochanter  Reason for Consult: L hip wound  Wound type: Deep Tissue Pressure Injury L trochanter  Pressure Injury POA: no  Measurement: see nursing flowsheet  Wound bed: purple maroon discoloration with epidermal lifting, small area of red moist noted at edge  Drainage (amount, consistency, odor) appears dry  Periwound: Dressing procedure/placement/frequency:  Cleanse L hip wound with soap and water , dry and apply Xeroform gauze ([place] [phone]) to wound bed daily, cover with silicone foam.   POC discussed with bedside nurse.  Appreciate A. Arlena Lacrosse, RN assistance with this consult.  Patient is contracted and would benefit from a low air loss mattress for pressure redistribution.    WOC team will follow weekly to assess area and change POC as needed.   Thank you,    Ronni Colace MSN, RN-BC, [employer] [phone]

## 2023-11-27 NOTE — Consult Note (Addendum)
 NAME:  [name] [name], MRN:  [id number], DOB:  August 20, 1933, LOS: 7 ADMISSION DATE:  11/20/2023, CONSULTATION DATE:  6/2 REFERRING MD:  Dr. Maury Space, CHIEF COMPLAINT:  aspiration pna; sepsis   History of Present Illness:  Patient is a 89 yo M w/ pertinent PMH dementia, dysphagia s/p PEG tube, HTN, HLD, Hypothyroidism presents to [facility] on 5/26 w/ sepsis.  Patient bedbound at baseline. Patient having fever and chills. Also w/ increased urine frequency. Came to [facility] on 5/26 for further eval. Noted to be tachycardic and tachypneic. WBC 16 and PCT 2.6. LA 7.4. Cultures obtained, started on abx, given iv fluids. UA negative for UTI. CXR concerning for aspiration pna. Covid/flu/rsv negative. Admitted by [facility]. Palliative care consulted.  On 6/2 patient with frequent secretions requiring nt suctioning. Minimal o2 requirements. ABG pending. PCCM consulted.  Pertinent  Medical History   Past Medical History:  Diagnosis Date   Back pain 10/2008   per MRI 11/06/2008 - severe spinal stenosis worse at L4-5, L3-4, with neural compression at those levels   BPH (benign prostatic hyperplasia)    Eczema    Gout    Hypertension    Hypothyroidism      Significant Hospital Events: Including procedures, antibiotic start and stop dates in addition to other pertinent events   5/26 admitted for sepsis for asp pna 6/2 less responsive; pccm consulted  Interim History / Subjective:  See above  Objective    Blood pressure (!) 149/77, pulse 92, temperature 98.6 F (37 C), temperature source Axillary, resp. rate 20, height 6' (1.829 m), weight 52.4 kg, SpO2 98%.        Intake/Output Summary (Last 24 hours) at 11/27/2023 1321 Last data filed at 11/27/2023 1200 Gross per 24 hour  Intake 100 ml  Output 1475 ml  Net -1375 ml   Filed Weights   11/25/23 0322 11/26/23 0323 11/27/23 0411  Weight: 52.3 kg 50.4 kg 52.4 kg    Examination: General:   critically ill elderly appearing male w/ some resp distress HEENT: MM  pink/moist; Buh[place] in place Neuro: nonverbal at baseline; cough/gag present; mae spontaneously  CV: s1s2, no m/r/g PULM:  dim clear BS bilaterally; on [place] GI: soft, bsx4 active  Extremities: warm/dry   Resolved problem list   Assessment and Plan   Acute respiratory failure w/ hypoxia Severe sepsis Possible aspiration pna Plan: -spoke with son at bedside and other son over phone. Both would want everything done and remain full code. Also ok w/ tracheostomy if unable to get off ventilator. -[place] for sats >92% -pulm toiletry: CPT -nt suction as needed; place nasal trumpet -abg -Appears stable for progressive for now; if status declines will likely need transfer to ICU for possible intubation -abx per primary  AKI Hypernatremia Hyperkalemia Failure to thrive Dysphagia s/p PEG tube placement Hx of dementia Elevated tropnonin Anemia of chronic disease Hypothyroidism HTN Plan: -per primary  Best Practice (right click and "Reselect all SmartList Selections" daily)   Per primary  Labs   CBC: Recent Labs  Lab 11/20/23 1945 11/20/23 2151 11/21/23 0300 11/22/23 0900 11/23/23 0449 11/24/23 0300 11/26/23 0326  WBC 16.1*  --  14.5* 7.6 7.9 8.8 10.7*  NEUTROABS 14.5*  --   --   --  6.9 6.9 8.5*  HGB 10.7*   < > 9.6* 8.4* 8.6* 7.8* 8.0*  HCT 37.1*   < > 33.4* 28.1* 28.9* 26.6* 26.4*  MCV 92.3  --  92.8 88.6 90.6 90.5 90.1  PLT 229  --  164 150 155 126* 171   < > = values in this interval not displayed.    Basic Metabolic Panel: Recent Labs  Lab 11/21/23 0300 11/21/23 1535 11/21/23 1721 11/21/23 1923 11/22/23 0900 11/23/23 0449 11/24/23 0300 11/25/23 0400 11/26/23 0326  NA 154*  --   --  148* 149* 147* 145  --  149*  K 5.0  --   --  4.7 4.8 4.6 5.0 5.2* 5.2*  CL 120*  --   --  117* 121* 116* 114*  --  113*  CO2 21*  --   --  24 23 24 24   --  29  GLUCOSE 101*  --   --  98 103* 135* 127*  --  137*  BUN 104*  --   --  85* 83* 75* 73*  --  83*  CREATININE 1.69*  --    --  1.60* 1.61* 1.46* 1.50*  --  1.50*  CALCIUM 9.7  --   --  9.5 9.5 9.4 9.3  --  9.4  MG 2.5*  --   --   --   --  2.3 2.5* 2.5* 2.5*  PHOS 2.9   < > 4.0  --  4.3 3.8 4.1 4.6  --    < > = values in this interval not displayed.   GFR: Estimated Creatinine Clearance: 25.2 mL/min (A) (by C-G formula based on SCr of 1.5 mg/dL (H)). Recent Labs  Lab 11/20/23 2119 11/21/23 0032 11/21/23 0243 11/21/23 0300 11/21/23 0830 11/22/23 0900 11/23/23 0449 11/24/23 0300 11/26/23 0326  PROCALCITON 2.50  --   --   --   --   --   --   --   --   WBC  --   --   --  14.5*  --  7.6 7.9 8.8 10.7*  LATICACIDVEN  --  3.6* 3.2* 3.2* 1.9  --   --   --   --     Liver Function Tests: Recent Labs  Lab 11/20/23 1945 11/21/23 0300 11/23/23 0449  AST 33 32 37  ALT 20 20 19   ALKPHOS 60 64 61  BILITOT 0.7 0.7 0.3  PROT 8.1 7.2 6.6  ALBUMIN  2.4* 2.3* 1.8*   No results for input(s): "LIPASE", "AMYLASE" in the last 168 hours. Recent Labs  Lab 11/20/23 2119  AMMONIA <13    ABG    Component Value Date/Time   HCO3 25.1 11/20/2023 2151   TCO2 27 11/20/2023 2151   ACIDBASEDEF 2.0 11/20/2023 2151   O2SAT 24 11/20/2023 2151     Coagulation Profile: Recent Labs  Lab 11/20/23 2106  INR 1.4*    Cardiac Enzymes: Recent Labs  Lab 11/20/23 2119  CKTOTAL 21*    HbA1C: Hgb A1c MFr Bld  Date/Time Value Ref Range Status  04/17/2023 06:31 PM 5.4 4.8 - 5.6 % Final    Comment:    (NOTE) Pre diabetes:          5.7%-6.4%  Diabetes:              >6.4%  Glycemic control for   <7.0% adults with diabetes     CBG: Recent Labs  Lab 11/26/23 2003 11/27/23 0005 11/27/23 0402 11/27/23 0725 11/27/23 1138  GLUCAP 123* 97 122* 122* 104*    Review of Systems:   Patient is altered/dementia; therefore, history has been obtained from chart review.    Past Medical History:  He,  has a past medical history of Back pain (10/2008), BPH (  benign prostatic hyperplasia), Eczema, Gout, Hypertension, and  Hypothyroidism.   Surgical History:   Past Surgical History:  Procedure Laterality Date   ESOPHAGOGASTRODUODENOSCOPY N/A 04/03/2022   Procedure: ESOPHAGOGASTRODUODENOSCOPY (EGD);  Surgeon: Alvis Jourdain, MD;  [facility] ENDOSCOPY;  Service: Gastroenterology;  Laterality: N/A;   IR REPLC GASTRO/COLONIC TUBE PERCUT W/FLUORO  09/29/2023   LAMINECTOMY  12/11/2008    Bilateral L2, L3, and L4 laminotomies, done by Dr. Larrie Po   LAPAROSCOPY N/A 04/06/2022   Procedure: LAPAROSCOPIC ASSISTED PERCUTANEOUS ENDOSCOPIC GASTROSTOMY PLACEMENT;  Surgeon: Dareen Ebbing, MD;  Location: [facility] OR;  Service: General;  Laterality: N/A;     Social History:   reports that he has never smoked. He has never used smokeless tobacco. He reports that he does not drink alcohol and does not use drugs.   Family History:  His family history is negative for Stroke and Cancer.   Allergies Allergies  Allergen Reactions   Pork-Derived Products Other (See Comments)    Religious Reasons     Home Medications  Prior to Admission medications   Medication Sig Start Date End Date Taking? Authorizing Provider  amLODipine  (NORVASC ) 2.5 MG tablet Take 1 tablet (2.5 mg total) by mouth daily. 10/09/23 10/08/24 Yes Doroteo Gasmen, MD  cyanocobalamin  1000 MCG tablet Crush 1 tablet (1,000 mcg total) and dissolve in a small amount of water  to give by feeding tube daily. 09/30/23  Yes Danford, Willis Harter, MD  Iron , Ferrous Sulfate , 325 (65 Fe) MG TABS Take 1 tablet (325 mg) by mouth every other day. Please make an appointment with Dr. Lincoln Renshaw for more refills. Patient taking differently: Place 325 mg into feeding tube every other day. 04/10/23  Yes Lawrance Presume, MD  levothyroxine  (SYNTHROID ) 75 MCG tablet Take 1 tablet (75 mcg total) by mouth daily before breakfast. 04/12/23  Yes Lawrance Presume, MD  lidocaine  (LIDODERM ) 5 % Place 1 patch onto the skin daily. Remove & Discard patch within 12 hours or as directed by MD  10/09/23  Yes Doroteo Gasmen, MD  Mouthwashes (MOUTH RINSE) LIQD solution 15 mLs by Mouth Rinse route 4 (four) times daily. 10/09/23  Yes Doroteo Gasmen, MD  Mouthwashes (MOUTH RINSE) LIQD solution 15 mLs by Mouth Rinse route as needed (oral care). 10/09/23  Yes Doroteo Gasmen, MD  Multiple Vitamin (MULTIVITAMIN WITH MINERALS) TABS tablet Take 1 tablet by mouth daily. 09/30/23  Yes Danford, Willis Harter, MD  Nutritional Supplements (FEEDING SUPPLEMENT, JEVITY 1.5 CAL/FIBER,) LIQD Place 1,440 mLs into feeding tube continuous. 09/30/23  Yes Danford, Willis Harter, MD  Water  For Irrigation, Sterile (FREE WATER ) SOLN Place 300 mLs into feeding tube every 6 (six) hours. 09/30/23  Yes Danford, Willis Harter, MD  pantoprazole  sodium (PROTONIX ) 40 mg Place 40 mg into feeding tube daily. 10/09/23 10/09/23  Doroteo Gasmen, MD     Critical care time: NA      Saralyn Cuna [facility] 11/27/2023, 1:21 PM  Please see [email or web address] for pager details.  From 7A-7P if no response, please call [phone]. After hours, please call ELink [phone].

## 2023-11-27 NOTE — Progress Notes (Signed)
 RT called to bedside by RN to deep suction pt. RT performed nasotracheal suctioning on pt per order and was able to get up a moderate amount of white/clear thick secretions. RT also orally suctioned pt and also got a moderate amount of thick white/clear secretions out. RN made aware that if pt needs multiple nasotracheal suctioning it is recommended to notified MD and get an order to place a nasal trumpet.

## 2023-11-28 LAB — COMPREHENSIVE METABOLIC PANEL WITH GFR
ALT: 30 U/L (ref 0–44)
ALT: 26 U/L (ref 0–44)
AST: 29 U/L (ref 15–41)
AST: 26 U/L (ref 15–41)
Albumin: 1.6 g/dL — ABNORMAL LOW (ref 3.5–5.0)
Albumin: 1.6 g/dL — ABNORMAL LOW (ref 3.5–5.0)
Alkaline Phosphatase: 77 U/L (ref 38–126)
Alkaline Phosphatase: 76 U/L (ref 38–126)
Anion gap: 8 (ref 5–15)
Anion gap: 8 (ref 5–15)
BUN: 95 mg/dL — ABNORMAL HIGH (ref 8–23)
BUN: 97 mg/dL — ABNORMAL HIGH (ref 8–23)
CO2: 28 mmol/L (ref 22–32)
CO2: 27 mmol/L (ref 22–32)
Calcium: 9.6 mg/dL (ref 8.9–10.3)
Calcium: 9.6 mg/dL (ref 8.9–10.3)
Chloride: 116 mmol/L — ABNORMAL HIGH (ref 98–111)
Chloride: 116 mmol/L — ABNORMAL HIGH (ref 98–111)
Creatinine, Ser: 1.79 mg/dL — ABNORMAL HIGH (ref 0.61–1.24)
Creatinine, Ser: 1.87 mg/dL — ABNORMAL HIGH (ref 0.61–1.24)
GFR, Estimated: 36 mL/min — ABNORMAL LOW (ref >60)
GFR, Estimated: 34 mL/min — ABNORMAL LOW (ref >60)
Glucose, Bld: 83 mg/dL (ref 70–99)
Glucose, Bld: 84 mg/dL (ref 70–99)
Potassium: 5.9 mmol/L — ABNORMAL HIGH (ref 3.5–5.1)
Potassium: 5.7 mmol/L — ABNORMAL HIGH (ref 3.5–5.1)
Sodium: 152 mmol/L — ABNORMAL HIGH (ref 135–145)
Sodium: 151 mmol/L — ABNORMAL HIGH (ref 135–145)
Total Bilirubin: 0.8 mg/dL (ref 0.0–1.2)
Total Bilirubin: 0.5 mg/dL (ref 0.0–1.2)
Total Protein: 6.6 g/dL (ref 6.5–8.1)
Total Protein: 6.8 g/dL (ref 6.5–8.1)

## 2023-11-28 LAB — CBC WITH DIFFERENTIAL/PLATELET
Abs Immature Granulocytes: 0.15 10*3/uL — ABNORMAL HIGH (ref 0.00–0.07)
Abs Immature Granulocytes: 0.12 10*3/uL — ABNORMAL HIGH (ref 0.00–0.07)
Basophils Absolute: 0 10*3/uL (ref 0.0–0.1)
Basophils Absolute: 0 10*3/uL (ref 0.0–0.1)
Basophils Relative: 0 %
Basophils Relative: 0 %
Eosinophils Absolute: 0 10*3/uL (ref 0.0–0.5)
Eosinophils Absolute: 0 10*3/uL (ref 0.0–0.5)
Eosinophils Relative: 0 %
Eosinophils Relative: 0 %
HCT: 26.2 % — ABNORMAL LOW (ref 39.0–52.0)
HCT: 26.4 % — ABNORMAL LOW (ref 39.0–52.0)
Hemoglobin: 7.6 g/dL — ABNORMAL LOW (ref 13.0–17.0)
Hemoglobin: 7.7 g/dL — ABNORMAL LOW (ref 13.0–17.0)
Immature Granulocytes: 1 %
Immature Granulocytes: 1 %
Lymphocytes Relative: 5 %
Lymphocytes Relative: 8 %
Lymphs Abs: 1.1 10*3/uL (ref 0.7–4.0)
Lymphs Abs: 1.6 10*3/uL (ref 0.7–4.0)
MCH: 26.7 pg (ref 26.0–34.0)
MCH: 26.6 pg (ref 26.0–34.0)
MCHC: 29 g/dL — ABNORMAL LOW (ref 30.0–36.0)
MCHC: 29.2 g/dL — ABNORMAL LOW (ref 30.0–36.0)
MCV: 91.9 fL (ref 80.0–100.0)
MCV: 91.3 fL (ref 80.0–100.0)
Monocytes Absolute: 0.5 10*3/uL (ref 0.1–1.0)
Monocytes Absolute: 0.6 10*3/uL (ref 0.1–1.0)
Monocytes Relative: 3 %
Monocytes Relative: 3 %
Neutro Abs: 18.2 10*3/uL — ABNORMAL HIGH (ref 1.7–7.7)
Neutro Abs: 17.9 10*3/uL — ABNORMAL HIGH (ref 1.7–7.7)
Neutrophils Relative %: 91 %
Neutrophils Relative %: 88 %
Platelets: 262 10*3/uL (ref 150–400)
Platelets: 274 10*3/uL (ref 150–400)
RBC: 2.85 MIL/uL — ABNORMAL LOW (ref 4.22–5.81)
RBC: 2.89 MIL/uL — ABNORMAL LOW (ref 4.22–5.81)
RDW: 18 % — ABNORMAL HIGH (ref 11.5–15.5)
RDW: 18 % — ABNORMAL HIGH (ref 11.5–15.5)
WBC: 20 10*3/uL — ABNORMAL HIGH (ref 4.0–10.5)
WBC: 20.3 10*3/uL — ABNORMAL HIGH (ref 4.0–10.5)
nRBC: 0 % (ref 0.0–0.2)
nRBC: 0 % (ref 0.0–0.2)

## 2023-11-28 LAB — URINALYSIS, ROUTINE W REFLEX MICROSCOPIC
Bilirubin Urine: NEGATIVE
Glucose, UA: NEGATIVE mg/dL
Hgb urine dipstick: NEGATIVE
Ketones, ur: 5 mg/dL — AB
Leukocytes,Ua: NEGATIVE
Nitrite: NEGATIVE
Protein, ur: 30 mg/dL — AB
Specific Gravity, Urine: 1.015 (ref 1.005–1.030)
pH: 5 (ref 5.0–8.0)

## 2023-11-28 LAB — GLUCOSE, CAPILLARY
Glucose-Capillary: 79 mg/dL (ref 70–99)
Glucose-Capillary: 120 mg/dL — ABNORMAL HIGH (ref 70–99)
Glucose-Capillary: 99 mg/dL (ref 70–99)
Glucose-Capillary: 103 mg/dL — ABNORMAL HIGH (ref 70–99)
Glucose-Capillary: 102 mg/dL — ABNORMAL HIGH (ref 70–99)

## 2023-11-28 LAB — BLOOD GAS, ARTERIAL
Acid-Base Excess: 4.7 mmol/L — ABNORMAL HIGH (ref 0.0–2.0)
Bicarbonate: 30.9 mmol/L — ABNORMAL HIGH (ref 20.0–28.0)
Drawn by: 33176
O2 Saturation: 97.7 %
Patient temperature: 37.2
pCO2 arterial: 51 mmHg — ABNORMAL HIGH (ref 32–48)
pH, Arterial: 7.39 (ref 7.35–7.45)
pO2, Arterial: 73 mmHg — ABNORMAL LOW (ref 83–108)

## 2023-11-28 LAB — PROCALCITONIN: Procalcitonin: 1.42 ng/mL

## 2023-11-28 LAB — C-REACTIVE PROTEIN: CRP: 16.8 mg/dL — ABNORMAL HIGH (ref <1.0)

## 2023-11-28 LAB — MAGNESIUM: Magnesium: 2.7 mg/dL — ABNORMAL HIGH (ref 1.7–2.4)

## 2023-11-28 MED ORDER — VANCOMYCIN HCL IN DEXTROSE 1-5 GM/200ML-% IV SOLN
1000.0000 mg | Freq: Once | INTRAVENOUS | Status: AC
  Administered 2023-11-28: 1000 mg via INTRAVENOUS
  Filled 2023-11-28: qty 200

## 2023-11-28 MED ORDER — VANCOMYCIN HCL 750 MG/150ML IV SOLN: 750.0000 mg | INTRAVENOUS | Status: DC

## 2023-11-28 MED ORDER — INSULIN ASPART 100 UNIT/ML IV SOLN
5.0000 [IU] | Freq: Once | INTRAVENOUS | Status: AC
Start: 2023-11-28 — End: 2023-11-28
  Administered 2023-11-28: 5 [IU] via INTRAVENOUS

## 2023-11-28 MED ORDER — SODIUM ZIRCONIUM CYCLOSILICATE 10 G PO PACK
10.0000 g | PACK | Freq: Once | ORAL | Status: AC
  Administered 2023-11-28: 10 g
  Filled 2023-11-28: qty 1

## 2023-11-28 MED ORDER — PIPERACILLIN-TAZOBACTAM 3.375 G IVPB
3.3750 g | Freq: Three times a day (TID) | INTRAVENOUS | Status: DC
  Administered 2023-11-28 – 2023-11-29 (×4): 3.375 g via INTRAVENOUS
  Filled 2023-11-28 (×4): qty 50

## 2023-11-28 MED ORDER — DEXTROSE 5 % IV SOLN: INTRAVENOUS | Status: AC

## 2023-11-28 MED ORDER — DEXTROSE 50 % IV SOLN
1.0000 | Freq: Once | INTRAVENOUS | Status: AC
Start: 2023-11-28 — End: 2023-11-28
  Administered 2023-11-28: 50 mL via INTRAVENOUS
  Filled 2023-11-28: qty 50

## 2023-11-28 NOTE — Progress Notes (Signed)
 PROGRESS NOTE    [name] [name]  ZOX:[id number] DOB: 02/17/1934 DOA: 11/20/2023 PCP: Lawrance Presume, MD   Brief Narrative:  89 y.o. male with medical history significant of dementia, dysphagia status post PEG tube, hypertension, hyperlipidemia, hypothyroidism, gout, BPH presented with  fevers chills dysuria increased increased frequency in urination.  At baseline, patient is bedbound and moves throughout the day.  On presentation, he was mildly tachycardic and tachypneic with WBCs of 16.1, sodium of 160, potassium 5.7, creatinine 2.08, lactic acid of 7.4, troponin 149, procalcitonin 2.5.  UA negative for UTI.  Influenza/COVID/RSV PCR negative.  Chest x-ray consistent with residual or recurrent pneumonia versus aspiration.  He was started on broad-spectrum antibiotics.  Palliative care consulted. Patient remains full code.  PCCM consulted on 11/27/23.  Assessment & Plan:   Severe sepsis: Present on admission Possible aspiration pneumonia Acute respiratory failure with hypoxia -Presented with leukocytosis, tachycardia, tachypnea, lactic acidosis possibly from aspiration pneumonia.  Patient is bedbound and has history of recurrent aspirations. - Currently on cefepime .  Cultures negative so far.  DC'd vancomycin  on 11/22/2023. -  No temperature spikes recently - Currently on 2 L oxygen via nasal cannula.  Wean off as able.  Chest x-ray repeated on 11/27/2023 showed stable mild patchydisease bilaterally. -PCCM was consulted on 11/27/23 because of worsening respiratory status and intermittent apneic episodes: PCCM concerned that patient has continued to aspirate and tube feedings was held.  Nasal trumpet was placed as per pulmonary recommendations. -WBCs To 20 today despite being on Cefepime  almost a week.  Check blood cultures, UA and urine culture.  Switch cefepime  to Zosyn  and add vancomycin  for now. - Patient is more tachypneic this morning and almost unresponsive.  Will follow PCCM recommendations  regarding need for intubation.  Acute kidney injury Dehydration -Creatinine slightly trending up to 1.79 today.  Switch IV fluids to D5 +5 cc an hour.  On monitor  Hypernatremia - Sodium 160 on presentation.  Sodium 152 today.  IV fluids as above. Continue free water  via tube.    Failure to thrive in adult Goals of care Severe protein calorie malnutrition/hypoalbuminemia Dysphagia status post PEG tube placement History of dementia -Continue PEG tube feeding.  Patient possibly has reached to end of his life and prognosis is very poor.  Palliative care following intermittently.  Patient remains full code.  Elevated troponin - Did not trend upwards.  Possibly from demand ischemia.  No further workup needed  Hyperkalemia - Potassium 5.9 today.  Patient received another dose of Lokelma  this morning along with 5 units abdominal and 1 amp of D50.  Monitor  Leukocytosis - Worsening.  Plan as above.  Monitor.  Anemia of chronic disease - From chronic illnesses.  Hemoglobin slightly trending downwards.  Monitor intermittently  Hypothyroidism -Continue Synthroid   Essential hypertension - Blood pressure stable.  Amlodipine  on hold    DVT prophylaxis: SCDs Code Status: Full Family Communication: None at bedside Disposition Plan: Status is: Inpatient Remains inpatient appropriate because: Of severity of illness    Consultants: Palliative care  Procedures: None  Antimicrobials:  Anti-infectives (From admission, onward)    Start     Dose/Rate Route Frequency Ordered Stop   11/27/23 2100  ceFEPIme  (MAXIPIME ) 2 g in sodium chloride  0.9 % 100 mL IVPB        2 g 200 mL/hr over 30 Minutes Intravenous Every 24 hours 11/27/23 0854     11/21/23 2200  ceFEPIme  (MAXIPIME ) 2 g in sodium chloride  0.9 % 100 mL IVPB  2 g 200 mL/hr over 30 Minutes Intravenous Every 24 hours 11/20/23 2123 11/27/23 2159   11/20/23 2130  metroNIDAZOLE  (FLAGYL ) IVPB 500 mg  Status:  Discontinued         500 mg 100 mL/hr over 60 Minutes Intravenous Every 12 hours 11/20/23 2120 11/21/23 0826   11/20/23 2122  vancomycin  variable dose per unstable renal function (pharmacist dosing)  Status:  Discontinued         Does not apply See admin instructions 11/20/23 2123 11/22/23 1206   11/20/23 2115  ceFEPIme  (MAXIPIME ) 2 g in sodium chloride  0.9 % 100 mL IVPB        2 g 200 mL/hr over 30 Minutes Intravenous  Once 11/20/23 2100 11/20/23 2156   11/20/23 2115  Vancomycin  (VANCOCIN ) 1,250 mg in sodium chloride  0.9 % 250 mL IVPB        1,250 mg 166.7 mL/hr over 90 Minutes Intravenous  Once 11/20/23 2100 11/20/23 2300   11/20/23 2045  azithromycin  (ZITHROMAX ) 500 mg in sodium chloride  0.9 % 250 mL IVPB  Status:  Discontinued        500 mg 250 mL/hr over 60 Minutes Intravenous  Once 11/20/23 2039 11/20/23 2054   11/20/23 2045  cefTRIAXone  (ROCEPHIN ) 1 g in sodium chloride  0.9 % 100 mL IVPB  Status:  Discontinued        1 g 200 mL/hr over 30 Minutes Intravenous  Once 11/20/23 2039 11/20/23 2054        Subjective: Patient seen and examined at bedside.  Unresponsive.  No fever, seizures, agitation reported. Objective: Vitals:   11/27/23 2318 11/28/23 0333 11/28/23 0402 11/28/23 0717  BP: (!) 140/52 121/69  128/75  Pulse: 88 78 (!) 107 96  Resp: (!) 26 (!) 36 (!) 33 (!) 25  Temp: 99.9 F (37.7 C) 99.4 F (37.4 C)  99 F (37.2 C)  TempSrc: Axillary Axillary  Oral  SpO2: 99% 92% 95% 99%  Weight:  51.2 kg    Height:        Intake/Output Summary (Last 24 hours) at 11/28/2023 0740 Last data filed at 11/28/2023 0600 Gross per 24 hour  Intake 1180.96 ml  Output 1300 ml  Net -119.04 ml   Filed Weights   11/26/23 0323 11/27/23 0411 11/28/23 0333  Weight: 50.4 kg 52.4 kg 51.2 kg    Examination:  General: On 2 L oxygen by nasal.  Currently in no distress.  Chronically ill and deconditioned looking.  Very thinly built. ENT/neck: No palpable thyromegaly or elevated JVD noted respiratory: Decreased  breath sounds at bases with some crackles supplemental  CVS: Intermittently tachycardic, S1 and S2 heard  abdominal: Soft, nontender, slightly distended; no organomegaly, bowel sounds are heard  extremities: No cyanosis; lower extremity edema present CNS: Unresponsive currently  lymph: No obvious lymphadenopathy palpable  skin: No obvious petechiae/rashes  psych: Cannot assess because of mental status  musculoskeletal: No obvious joint deformity/erythema   Data Reviewed: I have personally reviewed following labs and imaging studies  CBC: Recent Labs  Lab 11/23/23 0449 11/24/23 0300 11/26/23 0326 11/27/23 1344 11/28/23 0340  WBC 7.9 8.8 10.7* 12.0* 20.0*  NEUTROABS 6.9 6.9 8.5* 9.7* 18.2*  HGB 8.6* 7.8* 8.0* 8.1* 7.6*  HCT 28.9* 26.6* 26.4* 27.6* 26.2*  MCV 90.6 90.5 90.1 92.0 91.9  PLT 155 126* 171 237 262   Basic Metabolic Panel: Recent Labs  Lab 11/21/23 1721 11/21/23 1923 11/22/23 0900 11/22/23 0900 11/23/23 0449 11/24/23 0300 11/25/23 0400 11/26/23 0326 11/27/23 1344 11/28/23  0340  NA  --    < > 149*  --  147* 145  --  149* 150* 152*  K  --    < > 4.8  --  4.6 5.0 5.2* 5.2* 5.9* 5.9*  CL  --    < > 121*  --  116* 114*  --  113* 111 116*  CO2  --    < > 23  --  24 24  --  29 28 28   GLUCOSE  --    < > 103*  --  135* 127*  --  137* 121* 83  BUN  --    < > 83*  --  75* 73*  --  83* 91* 95*  CREATININE  --    < > 1.61*  --  1.46* 1.50*  --  1.50* 1.62* 1.79*  CALCIUM  --    < > 9.5  --  9.4 9.3  --  9.4 9.6 9.6  MG  --   --   --    < > 2.3 2.5* 2.5* 2.5* 2.5* 2.7*  PHOS 4.0  --  4.3  --  3.8 4.1 4.6  --   --   --    < > = values in this interval not displayed.   GFR: Estimated Creatinine Clearance: 20.7 mL/min (A) (by C-G formula based on SCr of 1.79 mg/dL (H)). Liver Function Tests: Recent Labs  Lab 11/23/23 0449 11/27/23 1344 11/28/23 0340  AST 37 36 29  ALT 19 32 30  ALKPHOS 61 81 77  BILITOT 0.3 0.6 0.8  PROT 6.6 6.6 6.6  ALBUMIN  1.8* 1.6* 1.6*    No results for input(s): "LIPASE", "AMYLASE" in the last 168 hours. No results for input(s): "AMMONIA" in the last 168 hours.  Coagulation Profile: No results for input(s): "INR", "PROTIME" in the last 168 hours.  Cardiac Enzymes: No results for input(s): "CKTOTAL", "CKMB", "CKMBINDEX", "TROPONINI" in the last 168 hours.  BNP (last 3 results) No results for input(s): "PROBNP" in the last 8760 hours. HbA1C: No results for input(s): "HGBA1C" in the last 72 hours. CBG: Recent Labs  Lab 11/27/23 1651 11/27/23 2005 11/27/23 2313 11/28/23 0339 11/28/23 0733  GLUCAP 105* 89 91 79 120*   Lipid Profile: No results for input(s): "CHOL", "HDL", "LDLCALC", "TRIG", "CHOLHDL", "LDLDIRECT" in the last 72 hours. Thyroid  Function Tests: No results for input(s): "TSH", "T4TOTAL", "FREET4", "T3FREE", "THYROIDAB" in the last 72 hours.  Anemia Panel: No results for input(s): "VITAMINB12", "FOLATE", "FERRITIN", "TIBC", "IRON ", "RETICCTPCT" in the last 72 hours.  Sepsis Labs: Recent Labs  Lab 11/21/23 0830 11/27/23 1344  PROCALCITON  --  0.90  LATICACIDVEN 1.9  --     Recent Results (from the past 240 hours)  Resp panel by RT-PCR (RSV, Flu A&B, Covid) Anterior Nasal Swab     Status: None   Collection Time: 11/20/23  7:40 PM   Specimen: Anterior Nasal Swab  Result Value Ref Range Status   SARS Coronavirus 2 by RT PCR NEGATIVE NEGATIVE Final   Influenza A by PCR NEGATIVE NEGATIVE Final   Influenza B by PCR NEGATIVE NEGATIVE Final    Comment: (NOTE) The Xpert Xpress SARS-CoV-2/FLU/RSV plus assay is intended as an aid in the diagnosis of influenza from Nasopharyngeal swab specimens and should not be used as a sole basis for treatment. Nasal washings and aspirates are unacceptable for Xpert Xpress SARS-CoV-2/FLU/RSV testing.  Fact Sheet for Patients: BloggerCourse.com  Fact Sheet for Healthcare Providers: SeriousBroker.it  This  test is not yet approved or cleared by the [place]  FDA and has been authorized for detection and/or diagnosis of SARS-CoV-2 by FDA under an Emergency Use Authorization (EUA). This EUA will remain in effect (meaning this test can be used) for the duration of the COVID-19 declaration under Section 564(b)(1) of the Act, 21 U.S.C. section 360bbb-3(b)(1), unless the authorization is terminated or revoked.     Resp Syncytial Virus by PCR NEGATIVE NEGATIVE Final    Comment: (NOTE) Fact Sheet for Patients: BloggerCourse.com  Fact Sheet for Healthcare Providers: SeriousBroker.it  This test is not yet approved or cleared by the [place]  FDA and has been authorized for detection and/or diagnosis of SARS-CoV-2 by FDA under an Emergency Use Authorization (EUA). This EUA will remain in effect (meaning this test can be used) for the duration of the COVID-19 declaration under Section 564(b)(1) of the Act, 21 U.S.C. section 360bbb-3(b)(1), unless the authorization is terminated or revoked.  Performed at [facility], [address] [place], [place] [address]   Blood culture (routine x 2)     Status: None   Collection Time: 11/20/23  8:52 PM   Specimen: BLOOD  Result Value Ref Range Status   Specimen Description BLOOD BLOOD RIGHT HAND  Final   Special Requests AEROBIC BOTTLE ONLY Blood Culture adequate volume  Final   Culture   Final    NO GROWTH 5 DAYS Performed at [facility], [address], [place], [place] [address]    Report Status 11/25/2023 FINAL  Final  Blood culture (routine x 2)     Status: None   Collection Time: 11/20/23  9:06 PM   Specimen: BLOOD  Result Value Ref Range Status   Specimen Description BLOOD BLOOD LEFT HAND  Final   Special Requests AEROBIC BOTTLE ONLY Blood Culture adequate volume  Final   Culture   Final    NO GROWTH 5 DAYS Performed at [facility], [address]  [place], [place] [address]    Report Status 11/25/2023 FINAL  Final  Surgical pcr screen     Status: None   Collection Time: 11/21/23  3:17 PM   Specimen: Nasal Mucosa; Nasal Swab  Result Value Ref Range Status   MRSA, PCR NEGATIVE NEGATIVE Final   Staphylococcus aureus NEGATIVE NEGATIVE Final    Comment: (NOTE) The Xpert SA Assay (FDA approved for NASAL specimens in patients 88 years of age and older), is one component of a comprehensive surveillance program. It is not intended to diagnose infection nor to guide or monitor treatment. Performed at [facility], [address], [place], [place] [address]          Radiology Studies: DG CHEST PORT 1 VIEW Result Date: 11/27/2023 CLINICAL DATA:  Dyspnea EXAM: PORTABLE CHEST 1 VIEW COMPARISON:  Chest x-ray 11/26/2023 CT of the chest 10/06/1998 FINDINGS: There are mild patchy opacities in the right perihilar region and left mid and lower lung unchanged from the prior study. Cardiomediastinal silhouette is within normal limits. Left upper extremity PICC terminates in the SVC the there is no pneumothorax or pleural effusion. No acute fractures are seen. Healed proximal right humeral fracture present. IMPRESSION: Stable mild patchy airspace disease bilaterally. Electronically Signed   By: Tyron Gallon M.D.   On: 11/27/2023 15:34   DG CHEST PORT 1 VIEW Result Date: 11/26/2023 CLINICAL DATA:  Dyspnea EXAM: PORTABLE CHEST 1 VIEW COMPARISON:  11/23/2023 FINDINGS: Cardiac shadow is stable. Persistent  airspace opacity is noted throughout the left lung and right base stable from the prior exam. No sizable effusion is seen. Left PICC is noted. IMPRESSION: Stable appearance of the chest from the previous exam. Electronically Signed   By: Violeta Grey M.D.   On: 11/26/2023 09:49        Scheduled Meds:  Chlorhexidine  Gluconate Cloth  6 each Topical Daily   free water   200 mL Per Tube Q6H   glycopyrrolate  1 mg Per Tube BID   levothyroxine   75 mcg  Per Tube Q0600   lidocaine   1 patch Transdermal Q24H   mouth rinse  15 mL Mouth Rinse 4 times per day   scopolamine  1 patch Transdermal Q72H   senna-docusate  1 tablet Per Tube BID   sodium bicarbonate   650 mg Per Tube Once   sodium chloride  flush  10-40 mL Intracatheter Q12H   thiamine  (VITAMIN B1) injection  100 mg Intravenous Daily   Continuous Infusions:  sodium chloride  75 mL/hr at 11/28/23 0643   ceFEPime  (MAXIPIME ) IV Stopped (11/27/23 2236)   feeding supplement (JEVITY 1.5 CAL/FIBER) Stopped (11/27/23 1357)          Audria Leather, MD [facility] 11/28/2023, 7:40 AM

## 2023-11-28 NOTE — Progress Notes (Signed)
 [facility] night cross cover note:   I was notified by the patient's RN that this patient's potassium level this morning is 5.9, unchanged from yesterday.  He has received Lokelma  in the interval, but has not yet had a bowel movement.  Corrected calcium appears elevated following adjustment for albumin .  Therefore, we will refrain from calcium gluconate at this time.  Blood sugar per CMP result this morning appears to be in the 80s.  I ordered STAT EKG and have subsequently ordered an additional dose of Lokelma  10 g per tube x 1 now.  Additionally, have ordered NovoLog  5 units IV x 1 dose now as well as 1 amp of D50 to be administered now, with repeat CBG to be checked in 1 hour and 15 minutes from timing of administration of these medications.     Camelia Cavalier, DO Hospitalist

## 2023-11-28 NOTE — Progress Notes (Signed)
 Pharmacy Antibiotic Note  [name] [name] is a 89 y.o. male for which pharmacy has been consulted for cefepime  and vancomycin  dosing for sepsis. Patient with a history of PEG tube placement, hypertension, hypothyroidism, gout and BPH. Patient presenting with urinary frequency and groaning.  Was recently admitted with sputum cx at MRSA/PSA - treated with vanc/cefepime  then completed levaquin . WBC increasing to 20, PCT yesterday at 0.9, Scr 1.7 (CrCl 20 mL/min). Received vancomycin  last on 5/26. Has received 6 days of cefepime  recently.   Plan: Vancomycin  1g IV then 750 mg IV every 48 hours (estAUC  474, Vd 0.75, using TBW) Start zosyn  3.375g IV every 8 hours EI > will watch Scr trend if need to adjust if CrCl<20 mL/min  Monitor WBC, fever, renal function, cultures De-escalate when able  Height: 6' (182.9 cm) Weight: 51.2 kg (112 lb 14 oz) IBW/kg (Calculated) : 77.6  Temp (24hrs), Avg:99.3 F (37.4 C), Min:98.6 F (37 C), Max:99.9 F (37.7 C)  Recent Labs  Lab 11/21/23 0830 11/21/23 1923 11/23/23 0449 11/24/23 0300 11/26/23 0326 11/27/23 1344 11/28/23 0340  WBC  --    < > 7.9 8.8 10.7* 12.0* 20.0*  CREATININE  --    < > 1.46* 1.50* 1.50* 1.62* 1.79*  LATICACIDVEN 1.9  --   --   --   --   --   --    < > = values in this interval not displayed.    Estimated Creatinine Clearance: 20.7 mL/min (A) (by C-G formula based on SCr of 1.79 mg/dL (H)).    Allergies  Allergen Reactions   Pork-Derived Products Other (See Comments)    Religious Reasons   Antimicrobials this admission:  Cefepime  5/26,  5/28 >>6/2 Metronidazole  5/26 x1 Vanc 5/26, 6/3 >>  Dose adjustments this admission:  N/A  Microbiology results:  6/3 BCx: ordered 6/3 Ucx: ordered  5/26 Bcx: ng 5/27 MRSA PCR: neg  5/26 Resp PCR: neg   Thank you for allowing pharmacy to participate in this patient's care,  Nieves Bars, PharmD, BCCCP Clinical Pharmacist  Phone: [phone] 11/28/2023 8:27 AM  Please check AMION  for all [facility] phone numbers After 10:00 PM, call Main Pharmacy [phone]

## 2023-11-28 NOTE — Consult Note (Signed)
 NAME:  [name] [name], MRN:  [id number], DOB:  1934/02/17, LOS: 8 ADMISSION DATE:  11/20/2023, CONSULTATION DATE:  6/2 REFERRING MD:  Dr. Maury Space, CHIEF COMPLAINT:  aspiration pna; sepsis   History of Present Illness:  Patient is a 89 yo M w/ pertinent PMH dementia, dysphagia s/p PEG tube, HTN, HLD, Hypothyroidism presents to [facility] on 5/26 w/ sepsis.  Patient bedbound at baseline. Patient having fever and chills. Also w/ increased urine frequency. Came to [name] on 5/26 for further eval. Noted to be tachycardic and tachypneic. WBC 16 and PCT 2.6. LA 7.4. Cultures obtained, started on abx, given iv fluids. UA negative for UTI. CXR concerning for aspiration pna. Covid/flu/rsv negative. Admitted by [facility]. Palliative care consulted.  On 6/2 patient with frequent secretions requiring nt suctioning. Minimal o2 requirements. ABG pending. PCCM consulted.  Pertinent  Medical History   Past Medical History:  Diagnosis Date   Back pain 10/2008   per MRI 11/06/2008 - severe spinal stenosis worse at L4-5, L3-4, with neural compression at those levels   BPH (benign prostatic hyperplasia)    Eczema    Gout    Hypertension    Hypothyroidism      Significant Hospital Events: Including procedures, antibiotic start and stop dates in addition to other pertinent events   5/26 admitted for sepsis for asp pna 6/2 less responsive; pccm consulted 11/28/2023 pulmonary reconsulted  Interim History / Subjective:  11/28/2023 pulmonary called back to bedside for respiratory distress  Objective    Blood pressure 128/75, pulse 96, temperature 99 F (37.2 C), temperature source Oral, resp. rate (!) 25, height 6' (1.829 m), weight 51.2 kg, SpO2 99%.        Intake/Output Summary (Last 24 hours) at 11/28/2023 0902 Last data filed at 11/28/2023 0800 Gross per 24 hour  Intake 1180.96 ml  Output 1300 ml  Net -119.04 ml   Filed Weights   11/26/23 0323 11/27/23 0411 11/28/23 0333  Weight: 50.4 kg 52.4 kg 51.2 kg     Examination: Critically.  Male who is unresponsive to verbal stimuli Positive JVD is noted Cheyne-Stokes respirations with decreased breath sounds throughout Heart sounds are distant Abdomen is unremarkable He has extreme contractures of lower extremities   Resolved problem list   Assessment and Plan   Acute respiratory failure w/ hypoxia Severe sepsis Possible aspiration pna Plan: Family is requested full code and tracheostomy if needed Currently he does not need to be intubated he has Cheyne-Stokes respirations And O2 saturations are 94% Systolic blood pressure is 140 He does not need to be transferred at this time to the intensive care unit.   AKI Hypernatremia Hyperkalemia Failure to thrive Dysphagia s/p PEG tube placement Hx of dementia Elevated tropnonin Anemia of chronic disease Hypothyroidism HTN Plan: -per primary  Best Practice (right click and "Reselect all SmartList Selections" daily)   Per primary  Labs   CBC: Recent Labs  Lab 11/23/23 0449 11/24/23 0300 11/26/23 0326 11/27/23 1344 11/28/23 0340  WBC 7.9 8.8 10.7* 12.0* 20.0*  NEUTROABS 6.9 6.9 8.5* 9.7* 18.2*  HGB 8.6* 7.8* 8.0* 8.1* 7.6*  HCT 28.9* 26.6* 26.4* 27.6* 26.2*  MCV 90.6 90.5 90.1 92.0 91.9  PLT 155 126* 171 237 262    Basic Metabolic Panel: Recent Labs  Lab 11/21/23 1721 11/21/23 1923 11/22/23 0900 11/22/23 0900 11/23/23 0449 11/24/23 0300 11/25/23 0400 11/26/23 0326 11/27/23 1344 11/28/23 0340  NA  --    < > 149*  --  147* 145  --  149* 150* 152*  K  --    < > 4.8  --  4.6 5.0 5.2* 5.2* 5.9* 5.9*  CL  --    < > 121*  --  116* 114*  --  113* 111 116*  CO2  --    < > 23  --  24 24  --  29 28 28   GLUCOSE  --    < > 103*  --  135* 127*  --  137* 121* 83  BUN  --    < > 83*  --  75* 73*  --  83* 91* 95*  CREATININE  --    < > 1.61*  --  1.46* 1.50*  --  1.50* 1.62* 1.79*  CALCIUM  --    < > 9.5  --  9.4 9.3  --  9.4 9.6 9.6  MG  --   --   --    < > 2.3 2.5*  2.5* 2.5* 2.5* 2.7*  PHOS 4.0  --  4.3  --  3.8 4.1 4.6  --   --   --    < > = values in this interval not displayed.   GFR: Estimated Creatinine Clearance: 20.7 mL/min (A) (by C-G formula based on SCr of 1.79 mg/dL (H)). Recent Labs  Lab 11/24/23 0300 11/26/23 0326 11/27/23 1344 11/28/23 0340  PROCALCITON  --   --  0.90  --   WBC 8.8 10.7* 12.0* 20.0*    Liver Function Tests: Recent Labs  Lab 11/23/23 0449 11/27/23 1344 11/28/23 0340  AST 37 36 29  ALT 19 32 30  ALKPHOS 61 81 77  BILITOT 0.3 0.6 0.8  PROT 6.6 6.6 6.6  ALBUMIN  1.8* 1.6* 1.6*   No results for input(s): "LIPASE", "AMYLASE" in the last 168 hours. No results for input(s): "AMMONIA" in the last 168 hours.   ABG    Component Value Date/Time   PHART 7.49 (H) 11/27/2023 1623   PCO2ART 45 11/27/2023 1623   PO2ART 64 (L) 11/27/2023 1623   HCO3 34.3 (H) 11/27/2023 1623   TCO2 27 11/20/2023 2151   ACIDBASEDEF 2.0 11/20/2023 2151   O2SAT 99.5 11/27/2023 1623     Coagulation Profile: No results for input(s): "INR", "PROTIME" in the last 168 hours.   Cardiac Enzymes: No results for input(s): "CKTOTAL", "CKMB", "CKMBINDEX", "TROPONINI" in the last 168 hours.   HbA1C: Hgb A1c MFr Bld  Date/Time Value Ref Range Status  04/17/2023 06:31 PM 5.4 4.8 - 5.6 % Final    Comment:    (NOTE) Pre diabetes:          5.7%-6.4%  Diabetes:              >6.4%  Glycemic control for   <7.0% adults with diabetes     CBG: Recent Labs  Lab 11/27/23 2005 11/27/23 2313 11/28/23 0339 11/28/23 0733 11/28/23 0754  GLUCAP 89 91 79 120* 99       Critical care time: NA     Steve Anmarie Fukushima ACNP Acute Care Nurse Practitioner [facility] Pulmonary/Critical Care Please consult Amion 11/28/2023, 9:03 AM

## 2023-11-28 NOTE — Progress Notes (Addendum)
 Nutrition Follow-up  DOCUMENTATION CODES:   Severe malnutrition in context of chronic illness  INTERVENTION:  If/when appropriate, re-initiate tube feeding via PEG: Jevity 1.5 at 60 ml/h (1440 ml per day) FWF q6 hours (800 ml per day)   Provides 2160 kcal, 92 gm protein, 1894 ml free water  daily (TF + FWF)   If in line with goals of care, consider converting G tube into a G-J tube which may help decrease vomiting and aspiration risk of tube feedings, but not secretions or gastric contents   Continue Thiamine  100 mg daily  Consider revision of bowel regimen to promote bowel regularity   NUTRITION DIAGNOSIS:  Severe Malnutrition related to chronic illness (dysphagia (PEG dependent) and dementia) as evidenced by percent weight loss, severe muscle depletion, severe fat depletion. - remains applicable  GOAL:  Patient will meet greater than or equal to 90% of their needs - not meeting as TF on hold  MONITOR:  TF tolerance, Skin, Weight trends  REASON FOR ASSESSMENT:  Consult Assessment of nutrition requirement/status, Enteral/tube feeding initiation and management  ASSESSMENT:  Pt with PMH significant for: dementia, dysphagia s/p PEG tube, HTN, HLD, hypothyroidism, gout, BPH. Presented with fever/chills, dysuria and increased frequency of urination. Found to be septic likely d/t aspiration PNA.  TFs stopped yesterday afternoon by critical care due to suspected aspiration. Remain on hold this morning during bedside visit. MD advising to ask CCM as to whether TF can be re-started or not. RN has reached out to CCM. Awaiting reply.  He continues with significant amount of secretions that are difficult to expectorate. Nasal trumpet placed to aid in deep suctioning. He is likely aspirating his secretions. RN reports he is more dry today. Could also be attributable to use of nasal trumpet. GOC discussions ongoing, however family not willing to pursue comfort care. If in line with goals  of care, consider converting G tube into a J tube which may help decrease vomiting and aspiration risk of tube feedings, but not of secretions or gastric contents.    Admit Weight: 53.5kg Current Weight: 51.2kg +moderate edema to BLEs   Weight stable, however still significantly underweight. Likely 2/2 continued age- and disease-related decline. Continues with moderate edema to BLEs. True body weight likely much lower.    Drains/Lines: LUQ: PEG (16Fr) placed 09/29/2023 UOP: x24 hours   Sodium remains high. Severe dehydration likely precipitating AKI. Fluid flushes and D5 ordered. Crt trending back up. Corrected calcium elevated likely 2/2 immobility and AKI. Monitor trend. No calcium supplements or MVI in place.    Of note, patient was on Reglan  last admission as a prokinetic agent. Consider reinitiating. Concerning as he is not documented with BM since admission. RN reports has not had BM in last two days while she has been covering patient. This could also contribute to high potassium. Lokelma  ordered again today. He is also on Senna.   Meds: levothyroxine , senna-docusate, thiamine , IV ABX   Labs:  Na+ 150>152>151(H) K+ 5.9>5.9>5.7(H) Mg 2.5>2.5>2.7 (H) Corr Ca 11.5 (H) Crt 1.50>1.62>1.79>1.87(H) WBC 10.7>12.0>20.0>20.3 (wdl) CRP 16.8 (H) CBGs 83-121 x24 hours   Diet Order:   Diet Order             Diet NPO time specified  Diet effective now            EDUCATION NEEDS: Not appropriate for education at this time  Skin:  Skin Integrity Issues:: DTI DTI: L thigh  Last BM:  PTA  Height:  Ht Readings  from Last 1 Encounters:  11/20/23 6' (1.829 m)   Weight:  Wt Readings from Last 1 Encounters:  11/28/23 51.2 kg   Ideal Body Weight:  80.9 kg  BMI:  Body mass index is 15.31 kg/m.  Estimated Nutritional Needs:   Kcal:  1800-2000 kcals  Protein:  90-110g  Fluid:  >1.8L/day  Con Decant MS, RD, LDN Registered Dietitian Clinical Nutrition RD Inpatient  Contact Info in Amion

## 2023-11-29 ENCOUNTER — Inpatient Hospital Stay (HOSPITAL_COMMUNITY): Payer: MEDICAID | Admitting: Anesthesiology

## 2023-11-29 ENCOUNTER — Inpatient Hospital Stay (HOSPITAL_COMMUNITY): Payer: MEDICAID

## 2023-11-29 DIAGNOSIS — I469 Cardiac arrest, cause unspecified: Secondary | ICD-10-CM

## 2023-11-29 LAB — POCT I-STAT 7, (LYTES, BLD GAS, ICA,H+H)
Acid-base deficit: 8 mmol/L — ABNORMAL HIGH (ref 0.0–2.0)
Bicarbonate: 20.5 mmol/L (ref 20.0–28.0)
Calcium, Ion: 1.3 mmol/L (ref 1.15–1.40)
HCT: 23 % — ABNORMAL LOW (ref 39.0–52.0)
Hemoglobin: 7.8 g/dL — ABNORMAL LOW (ref 13.0–17.0)
O2 Saturation: 87 %
Potassium: 4.4 mmol/L (ref 3.5–5.1)
Sodium: 146 mmol/L — ABNORMAL HIGH (ref 135–145)
TCO2: 22 mmol/L (ref 22–32)
pCO2 arterial: 60.9 mmHg — ABNORMAL HIGH (ref 32–48)
pH, Arterial: 7.135 — CL (ref 7.35–7.45)
pO2, Arterial: 70 mmHg — ABNORMAL LOW (ref 83–108)

## 2023-11-29 LAB — CBC
HCT: 25.8 % — ABNORMAL LOW (ref 39.0–52.0)
Hemoglobin: 7.4 g/dL — ABNORMAL LOW (ref 13.0–17.0)
MCH: 27 pg (ref 26.0–34.0)
MCHC: 28.7 g/dL — ABNORMAL LOW (ref 30.0–36.0)
MCV: 94.2 fL (ref 80.0–100.0)
Platelets: 323 10*3/uL (ref 150–400)
RBC: 2.74 MIL/uL — ABNORMAL LOW (ref 4.22–5.81)
RDW: 17.8 % — ABNORMAL HIGH (ref 11.5–15.5)
WBC: 22 10*3/uL — ABNORMAL HIGH (ref 4.0–10.5)
nRBC: 0 % (ref 0.0–0.2)

## 2023-11-29 LAB — BASIC METABOLIC PANEL WITH GFR
Anion gap: 7 (ref 5–15)
BUN: 83 mg/dL — ABNORMAL HIGH (ref 8–23)
CO2: 18 mmol/L — ABNORMAL LOW (ref 22–32)
Calcium: 7.1 mg/dL — ABNORMAL LOW (ref 8.9–10.3)
Chloride: 122 mmol/L — ABNORMAL HIGH (ref 98–111)
Creatinine, Ser: 1.63 mg/dL — ABNORMAL HIGH (ref 0.61–1.24)
GFR, Estimated: 40 mL/min — ABNORMAL LOW (ref >60)
Glucose, Bld: 163 mg/dL — ABNORMAL HIGH (ref 70–99)
Potassium: 3.6 mmol/L (ref 3.5–5.1)
Sodium: 147 mmol/L — ABNORMAL HIGH (ref 135–145)

## 2023-11-29 LAB — GLUCOSE, CAPILLARY
Glucose-Capillary: 108 mg/dL — ABNORMAL HIGH (ref 70–99)
Glucose-Capillary: 156 mg/dL — ABNORMAL HIGH (ref 70–99)
Glucose-Capillary: 159 mg/dL — ABNORMAL HIGH (ref 70–99)
Glucose-Capillary: 161 mg/dL — ABNORMAL HIGH (ref 70–99)
Glucose-Capillary: 121 mg/dL — ABNORMAL HIGH (ref 70–99)
Glucose-Capillary: 116 mg/dL — ABNORMAL HIGH (ref 70–99)
Glucose-Capillary: 108 mg/dL — ABNORMAL HIGH (ref 70–99)
Glucose-Capillary: 116 mg/dL — ABNORMAL HIGH (ref 70–99)

## 2023-11-29 LAB — URINE CULTURE: Culture: NO GROWTH

## 2023-11-29 LAB — MAGNESIUM: Magnesium: 2 mg/dL (ref 1.7–2.4)

## 2023-11-29 LAB — PHOSPHORUS: Phosphorus: 4 mg/dL (ref 2.5–4.6)

## 2023-11-29 MED ORDER — FENTANYL BOLUS VIA INFUSION
25.0000 ug | INTRAVENOUS | Status: DC | PRN
  Administered 2023-11-29: 25 ug via INTRAVENOUS
  Administered 2023-11-30: 100 ug via INTRAVENOUS

## 2023-11-29 MED ORDER — SODIUM BICARBONATE 8.4 % IV SOLN
50.0000 meq | Freq: Once | INTRAVENOUS | Status: AC
  Administered 2023-11-29: 50 meq via INTRAVENOUS
  Filled 2023-11-29: qty 50

## 2023-11-29 MED ORDER — NOREPINEPHRINE 4 MG/250ML-% IV SOLN
INTRAVENOUS | Status: AC
  Administered 2023-11-29: 5 ug/min via INTRAVENOUS
  Filled 2023-11-29: qty 250

## 2023-11-29 MED ORDER — VASOPRESSIN 20 UNITS/100 ML INFUSION FOR SHOCK
INTRAVENOUS | Status: AC
  Administered 2023-11-29: 0.03 [IU]/min via INTRAVENOUS
  Filled 2023-11-29: qty 100

## 2023-11-29 MED ORDER — SODIUM CHLORIDE 0.9 % IV SOLN
500.0000 mg | Freq: Two times a day (BID) | INTRAVENOUS | Status: DC
  Administered 2023-11-29 – 2023-12-02 (×7): 500 mg via INTRAVENOUS
  Filled 2023-11-29 (×7): qty 10

## 2023-11-29 MED ORDER — FENTANYL 2500MCG IN NS 250ML (10MCG/ML) PREMIX INFUSION
0.0000 ug/h | INTRAVENOUS | Status: DC
  Administered 2023-11-29: 25 ug/h via INTRAVENOUS
  Administered 2023-11-30: 75 ug/h via INTRAVENOUS
  Filled 2023-11-29 (×2): qty 250

## 2023-11-29 MED ORDER — NOREPINEPHRINE 4 MG/250ML-% IV SOLN
0.0000 ug/min | INTRAVENOUS | Status: DC
  Administered 2023-11-29: 24 ug/min via INTRAVENOUS
  Administered 2023-11-29: 40 ug/min via INTRAVENOUS
  Administered 2023-11-29: 28 ug/min via INTRAVENOUS
  Administered 2023-11-29: 16 ug/min via INTRAVENOUS
  Administered 2023-11-30: 10 ug/min via INTRAVENOUS
  Administered 2023-11-30: 7 ug/min via INTRAVENOUS
  Administered 2023-11-30: 8 ug/min via INTRAVENOUS
  Administered 2023-12-01: 3 ug/min via INTRAVENOUS
  Filled 2023-11-29 (×9): qty 250

## 2023-11-29 MED ORDER — LACTATED RINGERS IV BOLUS
1000.0000 mL | Freq: Once | INTRAVENOUS | Status: AC
  Administered 2023-11-29: 1000 mL via INTRAVENOUS

## 2023-11-29 MED ORDER — FENTANYL CITRATE PF 50 MCG/ML IJ SOSY
25.0000 ug | PREFILLED_SYRINGE | Freq: Once | INTRAMUSCULAR | Status: AC
  Administered 2023-11-29: 50 ug via INTRAVENOUS

## 2023-11-29 MED ORDER — VASOPRESSIN 20 UNITS/100 ML INFUSION FOR SHOCK
0.0000 [IU]/min | INTRAVENOUS | Status: DC
  Administered 2023-11-29: 0.03 [IU]/min via INTRAVENOUS
  Filled 2023-11-29 (×2): qty 100

## 2023-11-29 MED ORDER — LINEZOLID 600 MG/300ML IV SOLN
600.0000 mg | Freq: Two times a day (BID) | INTRAVENOUS | Status: AC
  Administered 2023-11-30 – 2023-12-02 (×6): 600 mg via INTRAVENOUS
  Filled 2023-11-29 (×6): qty 300

## 2023-11-29 MED ORDER — FAMOTIDINE 20 MG PO TABS
20.0000 mg | ORAL_TABLET | Freq: Every day | ORAL | Status: DC
  Administered 2023-11-29 – 2023-12-05 (×6): 20 mg
  Filled 2023-11-29 (×6): qty 1

## 2023-11-29 NOTE — Progress Notes (Signed)
 NAME:  [name] [name], MRN:  [id number], DOB:  1933-12-28, LOS: 9 ADMISSION DATE:  11/20/2023, CONSULTATION DATE:  6/2 REFERRING MD:  Dr. Maury Space, CHIEF COMPLAINT:  aspiration pna; sepsis   History of Present Illness:  Patient is a 89 yo M w/ pertinent PMH dementia, dysphagia s/p PEG tube, HTN, HLD, Hypothyroidism presents to [facility] on 5/26 w/ sepsis.  Patient bedbound at baseline. Patient having fever and chills. Also w/ increased urine frequency. Came to [facility] on 5/26 for further eval. Noted to be tachycardic and tachypneic. WBC 16 and PCT 2.6. LA 7.4. Cultures obtained, started on abx, given iv fluids. UA negative for UTI. CXR concerning for aspiration pna. Covid/flu/rsv negative. Admitted by [facility]. Palliative care consulted.  On 6/2 patient with frequent secretions requiring nt suctioning. Minimal o2 requirements. ABG pending. PCCM consulted.  Pertinent  Medical History   Past Medical History:  Diagnosis Date   Back pain 10/2008   per MRI 11/06/2008 - severe spinal stenosis worse at L4-5, L3-4, with neural compression at those levels   BPH (benign prostatic hyperplasia)    Eczema    Gout    Hypertension    Hypothyroidism      Significant Hospital Events: Including procedures, antibiotic start and stop dates in addition to other pertinent events   5/26 admitted for sepsis for asp pna 6/2 less responsive; pccm consulted 2023-12-26 pulmonary reconsulted Coded in the stepdown unit  Interim History / Subjective:  Status post respiratory arrest leading to cardiac arrest intubated transferred to intensive care unit  Objective    Blood pressure (!) 118/44, pulse 76, temperature 98.8 F (37.1 C), temperature source Axillary, resp. rate (!) 38, height 6' (1.829 m), weight 51.2 kg, SpO2 92%.        Intake/Output Summary (Last 24 hours) at 11/29/2023 1009 Last data filed at 11/29/2023 0800 Gross per 24 hour  Intake 2015.95 ml  Output 625 ml  Net 1390.95 ml   Filed Weights   11/26/23 0323  11/27/23 0411 12-26-2023 0333  Weight: 50.4 kg 52.4 kg 51.2 kg    Examination: Intubated male who is being transferred to the intensive care unit Tracheal tube is in place bilateral breath sounds are heard Tachycardic hypertensive from epinephrine  Abdomen with PEG tube Extremities are contracted Remains nonresponsive   Resolved problem list   Assessment and Plan   Acute respiratory failure w/ hypoxia Severe sepsis Possible aspiration pna 11/29/2023 patient had respiratory arrest leading to cardiac arrest requiring a CODE BLUE. Plan: Family is requested full code and tracheostomy if needed Intubated and transferred to the intensive care unit 11/29/2023 Ventilator orders as needed Received IV epinephrine  during code and is currently tachycardic and normotensive Will contact family to clarify CODE STATUS The 2 main alternatives here would be proceeding with tracheostomy and placement of trach PEG facility versus comfort care.      AKI Hypernatremia Hyperkalemia Failure to thrive Dysphagia s/p PEG tube placement Hx of dementia Elevated tropnonin Anemia of chronic disease Hypothyroidism HTN Plan: -per primary  Best Practice (right click and "Reselect all SmartList Selections" daily)   Per primary  Labs   CBC: Recent Labs  Lab 11/24/23 0300 11/26/23 0326 11/27/23 1344 2023/12/26 0340 12/26/2023 0928 11/29/23 0732  WBC 8.8 10.7* 12.0* 20.0* 20.3* 22.0*  NEUTROABS 6.9 8.5* 9.7* 18.2* 17.9*  --   HGB 7.8* 8.0* 8.1* 7.6* 7.7* 7.4*  HCT 26.6* 26.4* 27.6* 26.2* 26.4* 25.8*  MCV 90.5 90.1 92.0 91.9 91.3 94.2  PLT 126* 171 237 262  274 323    Basic Metabolic Panel: Recent Labs  Lab 11/23/23 0449 11/24/23 0300 11/25/23 0400 11/26/23 0326 11/27/23 1344 11/28/23 0340 11/28/23 0928 11/29/23 0732  NA 147* 145  --  149* 150* 152* 151* 147*  K 4.6 5.0 5.2* 5.2* 5.9* 5.9* 5.7* 3.6  CL 116* 114*  --  113* 111 116* 116* 122*  CO2 24 24  --  29 28 28 27  18*  GLUCOSE  135* 127*  --  137* 121* 83 84 163*  BUN 75* 73*  --  83* 91* 95* 97* 83*  CREATININE 1.46* 1.50*  --  1.50* 1.62* 1.79* 1.87* 1.63*  CALCIUM 9.4 9.3  --  9.4 9.6 9.6 9.6 7.1*  MG 2.3 2.5* 2.5* 2.5* 2.5* 2.7*  --  2.0  PHOS 3.8 4.1 4.6  --   --   --   --  4.0   GFR: Estimated Creatinine Clearance: 22.7 mL/min (A) (by C-G formula based on SCr of 1.63 mg/dL (H)). Recent Labs  Lab 11/27/23 1344 11/28/23 0340 11/28/23 0928 11/29/23 0732  PROCALCITON 0.90  --  1.42  --   WBC 12.0* 20.0* 20.3* 22.0*    Liver Function Tests: Recent Labs  Lab 11/23/23 0449 11/27/23 1344 11/28/23 0340 11/28/23 0928  AST 37 36 29 26  ALT 19 32 30 26  ALKPHOS 61 81 77 76  BILITOT 0.3 0.6 0.8 0.5  PROT 6.6 6.6 6.6 6.8  ALBUMIN  1.8* 1.6* 1.6* 1.6*   No results for input(s): "LIPASE", "AMYLASE" in the last 168 hours. No results for input(s): "AMMONIA" in the last 168 hours.   ABG    Component Value Date/Time   PHART 7.39 11/28/2023 1000   PCO2ART 51 (H) 11/28/2023 1000   PO2ART 73 (L) 11/28/2023 1000   HCO3 30.9 (H) 11/28/2023 1000   TCO2 27 11/20/2023 2151   ACIDBASEDEF 2.0 11/20/2023 2151   O2SAT 97.7 11/28/2023 1000     Coagulation Profile: No results for input(s): "INR", "PROTIME" in the last 168 hours.   Cardiac Enzymes: No results for input(s): "CKTOTAL", "CKMB", "CKMBINDEX", "TROPONINI" in the last 168 hours.   HbA1C: Hgb A1c MFr Bld  Date/Time Value Ref Range Status  04/17/2023 06:31 PM 5.4 4.8 - 5.6 % Final    Comment:    (NOTE) Pre diabetes:          5.7%-6.4%  Diabetes:              >6.4%  Glycemic control for   <7.0% adults with diabetes     CBG: Recent Labs  Lab 11/28/23 1622 11/28/23 1920 11/28/23 2355 11/29/23 0349 11/29/23 0736  GLUCAP 102* 108* 159* 161* 156*       Critical care time: NA     Siegfried Dress Sabryna Lahm ACNP Acute Care Nurse Practitioner [facility] Pulmonary/Critical Care Please consult Amion 11/29/2023, 10:09 AM

## 2023-11-29 NOTE — Progress Notes (Signed)
   11/29/23 0945  Spiritual Encounters  Type of Visit Initial  Care provided to: Family  Conversation partners present during encounter Physician;Nurse  Referral source Code page  Reason for visit Code  OnCall Visit No  Interventions  Spiritual Care Interventions Made Established relationship of care and support;Explored ethical dilemma    Chaplain responded to Code [name] page. Family not present upon arrival. Chaplain provided spiritual support to staff as much as able.  Pt's family (son and daughter-in-law) politely declined chaplain services. Chaplain explained that we remain available, and have a chaplain of the Muslim faith available if requested as well.  Chaplain will follow up with medical staff.

## 2023-11-29 NOTE — Progress Notes (Signed)
 PROGRESS NOTE    [name] [name]  DGU:[id number] DOB: Sep 08, 1933 DOA: 11/20/2023 PCP: Lawrance Presume, MD   Brief Narrative:  This 89 yrs old male with PMH significant of dementia, dysphagia status post PEG tube, hypertension, hyperlipidemia, hypothyroidism, gout, BPH presented in the ED with fever,  chills,  dysuria  and increased increased frequency in urination.  At baseline, patient is bedbound and moves throughout the day in the bed.  On presentation, he was mildly tachycardic and tachypneic with WBCs of 16.1, sodium of 160, potassium 5.7, creatinine 2.08, lactic acid of 7.4, troponin 149, procalcitonin 2.5.  UA negative for UTI.  Influenza/COVID/RSV PCR negative.  Chest x-ray consistent with residual or recurrent pneumonia versus aspiration.  He was started on broad-spectrum antibiotics.  Palliative care consulted. Patient remains full code.  PCCM consulted on 11/27/23.  Patient had code blue and was subsequently intubated and transferred to ICU.  Assessment & Plan:   Principal Problem:   Severe sepsis (HCC) Active Problems:   Aspiration pneumonia (HCC)   AKI (acute kidney injury) (HCC)   Hypernatremia   Dehydration   Protein-calorie malnutrition, severe   Failure to thrive in adult   Dysphagia   PEG (percutaneous endoscopic gastrostomy) status (HCC)   Hyperkalemia   Elevated troponin   Essential hypertension   Anemia, chronic disease   Hypothyroidism   Recurrent pneumonia   Severe sepsis: Present on admission Acute respiratory failure with hypoxia: Possible aspiration pneumonia He presented with leukocytosis, tachycardia, tachypnea, lactic acidosis possibly from aspiration pneumonia.   Patient is bedbound and has history of recurrent aspirations. Currently on cefepime .  Cultures negative so far.  DC'd vancomycin  on 11/22/2023. No temperature spikes recently Currently on 2 L oxygen via nasal cannula.  Wean off as able.   Chest x-ray repeated on 11/27/2023 showed stable mild patchy  airspace disease bilaterally. PCCM was consulted on 11/27/23 because of worsening respiratory status and intermittent apneic episodes. PCCM concerned that patient has continued to aspirate and tube feedings was held.   Nasal trumpet was placed as per pulmonary recommendations. -WBCs 22K despite being on Cefepime  for almost a week.   Check blood cultures, UA and urine culture.   Switched cefepime  to Zosyn  and added vancomycin  for now. Patient is more tachypneic this morning and almost unresponsive.  Patient had code blue,  Subsequently intubated and transferred to ICU.   Acute kidney injury: Dehydration Creatinine slowly improving  Switched IV fluids to D5 +5 cc an hour.     Hypernatremia: Sodium 160 on presentation.  Sodium 147 today.   Continue IV fluids as above. Continue free water  via tube.     Failure to thrive in adult: Goals of care discussion: Severe protein calorie malnutrition/hypoalbuminemia: Dysphagia status post PEG tube placement History of dementia Continue PEG tube feeding.  Patient possibly has reached to end of his life and prognosis is very poor.   Palliative care following intermittently.  Patient remains full code.   Elevated troponin Did not trend upwards.  Possibly from demand ischemia.  No further workup needed   Hyperkalemia Patient has received a dose of Lokelma .  Potassium normalized.  Leukocytosis Worsening.  Continue antibiotics. Monitoe WBC   Anemia of chronic disease From chronic illnesses.  Hemoglobin slightly trending downwards.  Monitor intermittently   Hypothyroidism: Continue Synthroid    Essential hypertension Blood pressure stable.  Amlodipine  on hold.   DVT prophylaxis: SCDs Code Status: Full code Family Communication: No family at bed side Disposition Plan:    Status is:  Inpatient Remains inpatient appropriate because: Severity of illness.     Consultants:  PCCM  Procedures:Intubated/ Moved to ICU  Antimicrobials:   Anti-infectives (From admission, onward)    Start     Dose/Rate Route Frequency Ordered Stop   11/30/23 1000  vancomycin  (VANCOREADY) IVPB 750 mg/150 mL  Status:  Discontinued       Placed in "Followed by" Linked Group   750 mg 150 mL/hr over 60 Minutes Intravenous Every 48 hours 11/28/23 0826 11/29/23 1029   11/30/23 1000  linezolid  (ZYVOX ) IVPB 600 mg        600 mg 300 mL/hr over 60 Minutes Intravenous Every 12 hours 11/29/23 1029     11/29/23 1115  meropenem (MERREM) 500 mg in sodium chloride  0.9 % 100 mL IVPB        500 mg 200 mL/hr over 30 Minutes Intravenous Every 12 hours 11/29/23 1029     11/28/23 0915  vancomycin  (VANCOCIN ) IVPB 1000 mg/200 mL premix       Placed in "Followed by" Linked Group   1,000 mg 200 mL/hr over 60 Minutes Intravenous  Once 11/28/23 0826 11/28/23 1220   11/28/23 0915  piperacillin -tazobactam (ZOSYN ) IVPB 3.375 g  Status:  Discontinued        3.375 g 12.5 mL/hr over 240 Minutes Intravenous Every 8 hours 11/28/23 0826 11/29/23 1029   11/27/23 2100  ceFEPIme  (MAXIPIME ) 2 g in sodium chloride  0.9 % 100 mL IVPB  Status:  Discontinued        2 g 200 mL/hr over 30 Minutes Intravenous Every 24 hours 11/27/23 0854 11/28/23 0750   11/21/23 2200  ceFEPIme  (MAXIPIME ) 2 g in sodium chloride  0.9 % 100 mL IVPB        2 g 200 mL/hr over 30 Minutes Intravenous Every 24 hours 11/20/23 2123 11/27/23 2159   11/20/23 2130  metroNIDAZOLE  (FLAGYL ) IVPB 500 mg  Status:  Discontinued        500 mg 100 mL/hr over 60 Minutes Intravenous Every 12 hours 11/20/23 2120 11/21/23 0826   11/20/23 2122  vancomycin  variable dose per unstable renal function (pharmacist dosing)  Status:  Discontinued         Does not apply See admin instructions 11/20/23 2123 11/22/23 1206   11/20/23 2115  ceFEPIme  (MAXIPIME ) 2 g in sodium chloride  0.9 % 100 mL IVPB        2 g 200 mL/hr over 30 Minutes Intravenous  Once 11/20/23 2100 11/20/23 2156   11/20/23 2115  Vancomycin  (VANCOCIN ) 1,250 mg in  sodium chloride  0.9 % 250 mL IVPB        1,250 mg 166.7 mL/hr over 90 Minutes Intravenous  Once 11/20/23 2100 11/20/23 2300   11/20/23 2045  azithromycin  (ZITHROMAX ) 500 mg in sodium chloride  0.9 % 250 mL IVPB  Status:  Discontinued        500 mg 250 mL/hr over 60 Minutes Intravenous  Once 11/20/23 2039 11/20/23 2054   11/20/23 2045  cefTRIAXone  (ROCEPHIN ) 1 g in sodium chloride  0.9 % 100 mL IVPB  Status:  Discontinued        1 g 200 mL/hr over 30 Minutes Intravenous  Once 11/20/23 2039 11/20/23 2054       Subjective: Patient seen and examined at bedside.  Overnight events noted. Patient was nonverbal, nonresponsive but vitals were fairly stable. Patient had a CODE BLUE subsequently intubated and patient was transferred to ICU.  Objective: Vitals:   11/29/23 1004 11/29/23 1010 11/29/23 1013 11/29/23 1023  BP: (!) 88/56 (!) 110/91 (!) 56/40   Pulse: (!) 130 (!) 126 (!) 131   Resp: 19 16 18    Temp:    97.6 F (36.4 C)  TempSrc:    Axillary  SpO2: 96% 97% 100%   Weight:    35.7 kg  Height:        Intake/Output Summary (Last 24 hours) at 11/29/2023 1123 Last data filed at 11/29/2023 0920 Gross per 24 hour  Intake 2075.95 ml  Output 325 ml  Net 1750.95 ml   Filed Weights   11/27/23 0411 11/28/23 0333 11/29/23 1023  Weight: 52.4 kg 51.2 kg 35.7 kg    Examination:  General exam: Appears calm and comfortable, not responsive, intubated.  Deconditioned Respiratory system: Decreased breath sounds. Respiratory effort normal.  RR 16 Cardiovascular system: S1 & S2 heard, RRR. No JVD, murmurs, rubs, gallops or clicks.  Gastrointestinal system: Abdomen is non distended, soft and non tender.  Normal bowel sounds heard. Central nervous system: Intubated, non verbal, No focal neurological deficits. Extremities: No edema, no cyanosis, no clubbing. Skin: No rashes, lesions or ulcers Psychiatry: Judgement and insight appear normal. Mood & affect appropriate.     Data Reviewed: I have  personally reviewed following labs and imaging studies  CBC: Recent Labs  Lab 11/24/23 0300 11/26/23 0326 11/27/23 1344 11/28/23 0340 11/28/23 0928 11/29/23 0732  WBC 8.8 10.7* 12.0* 20.0* 20.3* 22.0*  NEUTROABS 6.9 8.5* 9.7* 18.2* 17.9*  --   HGB 7.8* 8.0* 8.1* 7.6* 7.7* 7.4*  HCT 26.6* 26.4* 27.6* 26.2* 26.4* 25.8*  MCV 90.5 90.1 92.0 91.9 91.3 94.2  PLT 126* 171 237 262 274 323   Basic Metabolic Panel: Recent Labs  Lab 11/23/23 0449 11/24/23 0300 11/25/23 0400 11/26/23 0326 11/27/23 1344 11/28/23 0340 11/28/23 0928 11/29/23 0732  NA 147* 145  --  149* 150* 152* 151* 147*  K 4.6 5.0 5.2* 5.2* 5.9* 5.9* 5.7* 3.6  CL 116* 114*  --  113* 111 116* 116* 122*  CO2 24 24  --  29 28 28 27  18*  GLUCOSE 135* 127*  --  137* 121* 83 84 163*  BUN 75* 73*  --  83* 91* 95* 97* 83*  CREATININE 1.46* 1.50*  --  1.50* 1.62* 1.79* 1.87* 1.63*  CALCIUM 9.4 9.3  --  9.4 9.6 9.6 9.6 7.1*  MG 2.3 2.5* 2.5* 2.5* 2.5* 2.7*  --  2.0  PHOS 3.8 4.1 4.6  --   --   --   --  4.0   GFR: Estimated Creatinine Clearance: 15.8 mL/min (A) (by C-G formula based on SCr of 1.63 mg/dL (H)). Liver Function Tests: Recent Labs  Lab 11/23/23 0449 11/27/23 1344 11/28/23 0340 11/28/23 0928  AST 37 36 29 26  ALT 19 32 30 26  ALKPHOS 61 81 77 76  BILITOT 0.3 0.6 0.8 0.5  PROT 6.6 6.6 6.6 6.8  ALBUMIN  1.8* 1.6* 1.6* 1.6*   No results for input(s): "LIPASE", "AMYLASE" in the last 168 hours. No results for input(s): "AMMONIA" in the last 168 hours. Coagulation Profile: No results for input(s): "INR", "PROTIME" in the last 168 hours. Cardiac Enzymes: No results for input(s): "CKTOTAL", "CKMB", "CKMBINDEX", "TROPONINI" in the last 168 hours. BNP (last 3 results) No results for input(s): "PROBNP" in the last 8760 hours. HbA1C: No results for input(s): "HGBA1C" in the last 72 hours. CBG: Recent Labs  Lab 11/28/23 1920 11/28/23 2355 11/29/23 0349 11/29/23 0736 11/29/23 1021  GLUCAP 108* 159* 161*  156* 121*   Lipid Profile: No results for input(s): "CHOL", "HDL", "LDLCALC", "TRIG", "CHOLHDL", "LDLDIRECT" in the last 72 hours. Thyroid  Function Tests: No results for input(s): "TSH", "T4TOTAL", "FREET4", "T3FREE", "THYROIDAB" in the last 72 hours. Anemia Panel: No results for input(s): "VITAMINB12", "FOLATE", "FERRITIN", "TIBC", "IRON ", "RETICCTPCT" in the last 72 hours. Sepsis Labs: Recent Labs  Lab 11/27/23 1344 11/28/23 0928  PROCALCITON 0.90 1.42    Recent Results (from the past 240 hours)  Resp panel by RT-PCR (RSV, Flu A&B, Covid) Anterior Nasal Swab     Status: None   Collection Time: 11/20/23  7:40 PM   Specimen: Anterior Nasal Swab  Result Value Ref Range Status   SARS Coronavirus 2 by RT PCR NEGATIVE NEGATIVE Final   Influenza A by PCR NEGATIVE NEGATIVE Final   Influenza B by PCR NEGATIVE NEGATIVE Final    Comment: (NOTE) The Xpert Xpress SARS-CoV-2/FLU/RSV plus assay is intended as an aid in the diagnosis of influenza from Nasopharyngeal swab specimens and should not be used as a sole basis for treatment. Nasal washings and aspirates are unacceptable for Xpert Xpress SARS-CoV-2/FLU/RSV testing.  Fact Sheet for Patients: BloggerCourse.com  Fact Sheet for Healthcare Providers: SeriousBroker.it  This test is not yet approved or cleared by the [place]  FDA and has been authorized for detection and/or diagnosis of SARS-CoV-2 by FDA under an Emergency Use Authorization (EUA). This EUA will remain in effect (meaning this test can be used) for the duration of the COVID-19 declaration under Section 564(b)(1) of the Act, 21 U.S.C. section 360bbb-3(b)(1), unless the authorization is terminated or revoked.     Resp Syncytial Virus by PCR NEGATIVE NEGATIVE Final    Comment: (NOTE) Fact Sheet for Patients: BloggerCourse.com  Fact Sheet for Healthcare  Providers: SeriousBroker.it  This test is not yet approved or cleared by the [place]  FDA and has been authorized for detection and/or diagnosis of SARS-CoV-2 by FDA under an Emergency Use Authorization (EUA). This EUA will remain in effect (meaning this test can be used) for the duration of the COVID-19 declaration under Section 564(b)(1) of the Act, 21 U.S.C. section 360bbb-3(b)(1), unless the authorization is terminated or revoked.  Performed at [facility], [address], [place], [place] [address]   Blood culture (routine x 2)     Status: None   Collection Time: 11/20/23  8:52 PM   Specimen: BLOOD  Result Value Ref Range Status   Specimen Description BLOOD BLOOD RIGHT HAND  Final   Special Requests AEROBIC BOTTLE ONLY Blood Culture adequate volume  Final   Culture   Final    NO GROWTH 5 DAYS Performed at [name], [address] [place], [place] [address]    Report Status 11/25/2023 FINAL  Final  Blood culture (routine x 2)     Status: None   Collection Time: 11/20/23  9:06 PM   Specimen: BLOOD  Result Value Ref Range Status   Specimen Description BLOOD BLOOD LEFT HAND  Final   Special Requests AEROBIC BOTTLE ONLY Blood Culture adequate volume  Final   Culture   Final    NO GROWTH 5 DAYS Performed at [facility], [address] [place], [place] [address]    Report Status 11/25/2023 FINAL  Final  Surgical pcr screen     Status: None   Collection Time: 11/21/23  3:17 PM   Specimen: Nasal Mucosa; Nasal Swab  Result Value Ref Range Status   MRSA, PCR NEGATIVE NEGATIVE  Final   Staphylococcus aureus NEGATIVE NEGATIVE Final    Comment: (NOTE) The Xpert SA Assay (FDA approved for NASAL specimens in patients 88 years of age and older), is one component of a comprehensive surveillance program. It is not intended to diagnose infection nor to guide or monitor treatment. Performed at [name], [address] [place], [place] [address]   Remove and replace urinary cath (placed > 5 days) then obtain urine culture from new indwelling urinary catheter.     Status: None   Collection Time: 11/28/23  3:18 PM   Specimen: Urine, Catheterized  Result Value Ref Range Status   Specimen Description URINE, CATHETERIZED  Final   Special Requests NONE  Final   Culture   Final    NO GROWTH Performed at [facility], [address], [place], [place] [address]    Report Status 11/29/2023 FINAL  Final    Radiology Studies: DG CHEST PORT 1 VIEW Result Date: 11/29/2023 EXAM: 1 VIEW XRAY OF THE CHEST 11/29/2023 07:20:00 AM COMPARISON: 1 view chest x-ray 11/27/2023. CLINICAL HISTORY: Aspiration into airway. FINDINGS: LUNGS AND PLEURA: Increasing interstitial pattern likely reflects edema. Progressive bibasilar airspace disease is noted. HEART AND MEDIASTINUM: The heart is enlarged. BONES AND SOFT TISSUES: No acute osseous abnormality. LINES AND TUBES: Left-sided PICC line is stable. IMPRESSION: 1. Progressive bibasilar airspace disease, this may represent aspiration pneumonitis or pneumonia. 2. Increasing interstitial pattern, likely reflecting edema. 3. Enlarged heart. Electronically signed by: Audree Leas MD 11/29/2023 07:35 AM EDT RP Workstation: [id number]   DG CHEST PORT 1 VIEW Result Date: 11/27/2023 CLINICAL DATA:  Dyspnea EXAM: PORTABLE CHEST 1 VIEW COMPARISON:  Chest x-ray 11/26/2023 CT of the chest 10/06/1998 FINDINGS: There are mild patchy opacities in the right perihilar region and left mid and lower lung unchanged from the prior study. Cardiomediastinal silhouette is within normal limits. Left upper extremity PICC terminates in the SVC the there is no pneumothorax or pleural effusion. No acute fractures are seen. Healed proximal right humeral fracture present. IMPRESSION: Stable mild patchy airspace disease bilaterally. Electronically Signed   By: Tyron Gallon M.D.   On: 11/27/2023 15:34    Scheduled Meds:  Chlorhexidine  Gluconate Cloth  6 each Topical Daily   free water   200 mL Per Tube Q6H   levothyroxine   75 mcg Per Tube Q0600   lidocaine   1 patch Transdermal Q24H   mouth rinse  15 mL Mouth Rinse 4 times per day   scopolamine  1 patch Transdermal Q72H   senna-docusate  1 tablet Per Tube BID   sodium chloride  flush  10-40 mL Intracatheter Q12H   thiamine  (VITAMIN B1) injection  100 mg Intravenous Daily   Continuous Infusions:  feeding supplement (JEVITY 1.5 CAL/FIBER) 60 mL/hr at 11/29/23 0300   [START ON 11/30/2023] linezolid  (ZYVOX ) IV     meropenem (MERREM) IV 500 mg (11/29/23 1115)   norepinephrine (LEVOPHED) Adult infusion 5 mcg/min (11/29/23 1028)     LOS: 9 days    Time spent: 50 mins    Magdalene School, MD [facility]   If 7PM-7AM, please contact night-coverage

## 2023-11-29 NOTE — Anesthesia Procedure Notes (Signed)
 Procedure Name: Intubation Date/Time: 11/29/2023 9:48 AM  Performed by: Raymund Calix, CRNAPre-anesthesia Checklist: Patient identified, Emergency Drugs available, Suction available and Patient being monitored Patient Re-evaluated:Patient Re-evaluated prior to induction Oxygen Delivery Method: Ambu bag Preoxygenation: Pre-oxygenation with 100% oxygen Ventilation: Mask ventilation without difficulty Laryngoscope Size: Glidescope and 4 Grade View: Grade I Tube type: Oral Tube size: 7.5 mm Number of attempts: 1 Airway Equipment and Method: Stylet Placement Confirmation: ETT inserted through vocal cords under direct vision, positive ETCO2 and breath sounds checked- equal and bilateral Secured at: 24 cm Tube secured with: Tape Dental Injury: Teeth and Oropharynx as per pre-operative assessment

## 2023-11-29 NOTE — Progress Notes (Signed)
 This RN came out of another pt's room and saw that this pt's monitor was alarming "apnea". This RN immediately went to the room to assess. The pt was not breathing, code alarm was pulled, this RN checked for a pulse and subsequently began CPR.

## 2023-11-29 NOTE — Code Documentation (Signed)
  Patient Name: [name] [name]   MRN: [id number]   Date of Birth/ Sex: 1933/11/08 , male      Admission Date: 11/20/2023  Attending Provider: Magdalene School, MD  Primary Diagnosis: Severe sepsis [facility])   Indication: Pt was in his usual state of health until this AM, when he was noted to be hypoxic and asystole. Code blue was subsequently called. At the time of arrival on scene, ACLS protocol was underway.   Technical Description:  - CPR performance duration:  7 minutes (9:42-9:49 am)  - Was defibrillation or cardioversion used? No   - Was external pacer placed? Yes  - Was patient intubated pre/post CPR? Yes   Medications Administered: Y = Yes; Blank = No Amiodarone    Atropine    Calcium    Epinephrine   Y x3  Lidocaine     Magnesium    Norepinephrine    Phenylephrine     Sodium bicarbonate     Vasopressin     Post CPR evaluation:  - Final Status - Was patient successfully resuscitated ? Yes - What is current rhythm? Sinus tachycardia  - What is current hemodynamic status? Stable   Miscellaneous Information:  - Labs sent, including: None   - Primary team notified?  Yes  - Family Notified? Yes  - Additional notes/ transfer status: PCCM arrived at bedside and transferred to ICU after ROSC was obtained.      Aurora Lees, DO  11/29/2023, 10:37 AM

## 2023-11-29 NOTE — Significant Event (Signed)
 Rapid Response Event Note   Reason for Call :  Code Blue [phone]  Initial Focused Assessment:  Responded to Code [facility] page. ACLS already in progress upon arrival to room with zoll pads on, x1 epi given with compressions ongoing and BVM in use. Pulse check- PEA, compressions resumed. ACLS protocol followed. ROSC achieved 0949. Patient intubated. Transferred to 3M08 on zoll with RT and primary RN.   Per report, RN noted monitor alarming "apnea", immediately went to room to assess pt. Patient was not breathing and did not have pulse (PEA on monitor). Help called for immediately and compressions immediately started.   2113/97 (130) HR 130s  O2 96% vent 100% FiO2   BP began dropping in route, 88/56 (65) upon arrival to 3M08. Order for levo gtt obtained after discussion with family, gtt started.   Event Summary:  MD Notified: Duwayne Ginsberg MD, Nelson Bandy MD Call Time: [phone] Arrival Time: [phone] End Time: [address] Lowell Rude, RN

## 2023-11-29 NOTE — Progress Notes (Signed)
 Pharmacy Antibiotic Note  Mildred Bollard is a 89 y.o. male for which pharmacy has been consulted for cefepime  and vancomycin  dosing for sepsis. Patient with a history of PEG tube placement, hypertension, hypothyroidism, gout and BPH. Patient presenting with urinary frequency and groaning.  Was recently admitted with sputum cx at MRSA/PSA - treated with vanc/cefepime  then completed levaquin .   D/w micro today about the pseudomonas isolate in April. It's resistant to Zosyn  with MIC>128. D/w Dr Marce Sensing and we will change zosyn  to [facility]. Will also change vanc to linezolid  starting tomorrow to avoid risk for AKI issue.   Plan: Dc vanc/zosyn  Merrem 500mg  IV q12 Linezolid  600mg  IV q12 F/u LOT  Height: 6' (182.9 cm) Weight: 35.7 kg (78 lb 11.3 oz) IBW/kg (Calculated) : 77.6  Temp (24hrs), Avg:98.8 F (37.1 C), Min:97.6 F (36.4 C), Max:99.7 F (37.6 C)  Recent Labs  Lab 11/26/23 0326 11/27/23 1344 11/28/23 0340 11/28/23 0928 11/29/23 0732  WBC 10.7* 12.0* 20.0* 20.3* 22.0*  CREATININE 1.50* 1.62* 1.79* 1.87* 1.63*    Estimated Creatinine Clearance: 15.8 mL/min (A) (by C-G formula based on SCr of 1.63 mg/dL (H)).    Allergies  Allergen Reactions   Pork-Derived Products Other (See Comments)    Religious Reasons   Antimicrobials this admission:  Cefepime  5/26,  5/28 >>6/2 Metronidazole  5/26 x1 Vanc 5/26, 6/3 >>6/4 Zosyn  6/3>>6/4 Merrem 6/4>> Linezolid  6/5>>   Microbiology results:  6/3 BCx: ordered 6/3 Ucx: ordered  5/26 Bcx: ng 5/27 MRSA PCR: neg  5/26 Resp PCR: neg   6/3 BCx:  6/3 Ucx:  5/26 Bcx: ngF 5/27 MRSA PCR: neg  5/26 Resp PCR: neg 4/11 BCx: 1/4 staph hominis 4/11 Sputum: MRSA (Bactrim, Vanc, Linezolid  - S)  pseudomonas (ceftaz I), R zosyn  MIC>128 4/11 COVID/Flu PCR: neg    Ivery Marking, PharmD, BCIDP, AAHIVP, CPP Infectious Disease Pharmacist 11/29/2023 10:33 AM

## 2023-11-30 DIAGNOSIS — D649 Anemia, unspecified: Secondary | ICD-10-CM

## 2023-11-30 LAB — POCT I-STAT 7, (LYTES, BLD GAS, ICA,H+H)
Acid-base deficit: 6 mmol/L — ABNORMAL HIGH (ref 0.0–2.0)
Bicarbonate: 17 mmol/L — ABNORMAL LOW (ref 20.0–28.0)
Calcium, Ion: 1.2 mmol/L (ref 1.15–1.40)
HCT: 20 % — ABNORMAL LOW (ref 39.0–52.0)
Hemoglobin: 6.8 g/dL — CL (ref 13.0–17.0)
O2 Saturation: 95 %
Patient temperature: 98.2
Potassium: 4.5 mmol/L (ref 3.5–5.1)
Sodium: 140 mmol/L (ref 135–145)
TCO2: 18 mmol/L — ABNORMAL LOW (ref 22–32)
pCO2 arterial: 24.9 mmHg — ABNORMAL LOW (ref 32–48)
pH, Arterial: 7.442 (ref 7.35–7.45)
pO2, Arterial: 72 mmHg — ABNORMAL LOW (ref 83–108)

## 2023-11-30 LAB — BASIC METABOLIC PANEL WITH GFR
Anion gap: 14 (ref 5–15)
BUN: 103 mg/dL — ABNORMAL HIGH (ref 8–23)
CO2: 19 mmol/L — ABNORMAL LOW (ref 22–32)
Calcium: 8.3 mg/dL — ABNORMAL LOW (ref 8.9–10.3)
Chloride: 104 mmol/L (ref 98–111)
Creatinine, Ser: 2.58 mg/dL — ABNORMAL HIGH (ref 0.61–1.24)
GFR, Estimated: 23 mL/min — ABNORMAL LOW (ref >60)
Glucose, Bld: 245 mg/dL — ABNORMAL HIGH (ref 70–99)
Potassium: 4.4 mmol/L (ref 3.5–5.1)
Sodium: 137 mmol/L (ref 135–145)

## 2023-11-30 LAB — CBC
HCT: 21.6 % — ABNORMAL LOW (ref 39.0–52.0)
Hemoglobin: 6.5 g/dL — CL (ref 13.0–17.0)
MCH: 27.1 pg (ref 26.0–34.0)
MCHC: 30.1 g/dL (ref 30.0–36.0)
MCV: 90 fL (ref 80.0–100.0)
Platelets: 258 10*3/uL (ref 150–400)
RBC: 2.4 MIL/uL — ABNORMAL LOW (ref 4.22–5.81)
RDW: 17.8 % — ABNORMAL HIGH (ref 11.5–15.5)
WBC: 18.1 10*3/uL — ABNORMAL HIGH (ref 4.0–10.5)
nRBC: 0 % (ref 0.0–0.2)

## 2023-11-30 LAB — GLUCOSE, CAPILLARY
Glucose-Capillary: 105 mg/dL — ABNORMAL HIGH (ref 70–99)
Glucose-Capillary: 114 mg/dL — ABNORMAL HIGH (ref 70–99)
Glucose-Capillary: 124 mg/dL — ABNORMAL HIGH (ref 70–99)
Glucose-Capillary: 85 mg/dL (ref 70–99)
Glucose-Capillary: 90 mg/dL (ref 70–99)
Glucose-Capillary: 94 mg/dL (ref 70–99)

## 2023-11-30 LAB — HEMOGLOBIN AND HEMATOCRIT, BLOOD
HCT: 18.5 % — ABNORMAL LOW (ref 39.0–52.0)
HCT: 24.6 % — ABNORMAL LOW (ref 39.0–52.0)
Hemoglobin: 5.7 g/dL — CL (ref 13.0–17.0)
Hemoglobin: 7.7 g/dL — ABNORMAL LOW (ref 13.0–17.0)

## 2023-11-30 LAB — PREPARE RBC (CROSSMATCH)

## 2023-11-30 MED ORDER — SODIUM CHLORIDE 0.9 % IV BOLUS
500.0000 mL | Freq: Once | INTRAVENOUS | Status: AC
  Administered 2023-11-30: 500 mL via INTRAVENOUS

## 2023-11-30 MED ORDER — SODIUM CHLORIDE 0.9% IV SOLUTION: Freq: Once | INTRAVENOUS | Status: AC

## 2023-11-30 MED ORDER — POLYETHYLENE GLYCOL 3350 17 G PO PACK
17.0000 g | PACK | Freq: Two times a day (BID) | ORAL | Status: DC
  Administered 2023-11-30 – 2023-12-06 (×10): 17 g
  Filled 2023-11-30 (×11): qty 1

## 2023-11-30 NOTE — Care Plan (Signed)
 This patient had code blue yesterday, intubated and moved to ICU.  Patient remains under ICU care. We will sign out.

## 2023-11-30 NOTE — Plan of Care (Signed)
  Problem: Fluid Volume: Goal: Hemodynamic stability will improve Outcome: Progressing   Problem: Clinical Measurements: Goal: Diagnostic test results will improve Outcome: Progressing Goal: Signs and symptoms of infection will decrease Outcome: Progressing   Problem: Respiratory: Goal: Ability to maintain adequate ventilation will improve Outcome: Progressing   Problem: Education: Goal: Knowledge of General Education information will improve Description: Including pain rating scale, medication(s)/side effects and non-pharmacologic comfort measures Outcome: Progressing   Problem: Clinical Measurements: Goal: Ability to maintain clinical measurements within normal limits will improve Outcome: Progressing Goal: Will remain free from infection Outcome: Progressing Goal: Diagnostic test results will improve Outcome: Progressing Goal: Respiratory complications will improve Outcome: Progressing Goal: Cardiovascular complication will be avoided Outcome: Progressing   Problem: Activity: Goal: Risk for activity intolerance will decrease Outcome: Progressing   Problem: Nutrition: Goal: Adequate nutrition will be maintained Outcome: Progressing   Problem: Coping: Goal: Level of anxiety will decrease Outcome: Progressing   Problem: Elimination: Goal: Will not experience complications related to bowel motility Outcome: Progressing Goal: Will not experience complications related to urinary retention Outcome: Progressing   Problem: Pain Managment: Goal: General experience of comfort will improve and/or be controlled Outcome: Progressing   Problem: Safety: Goal: Ability to remain free from injury will improve Outcome: Progressing   Problem: Skin Integrity: Goal: Risk for impaired skin integrity will decrease Outcome: Progressing   Problem: Activity: Goal: Ability to tolerate increased activity will improve Outcome: Progressing   Problem: Respiratory: Goal: Ability to  maintain a clear airway and adequate ventilation will improve Outcome: Progressing   Problem: Role Relationship: Goal: Method of communication will improve Outcome: Progressing

## 2023-11-30 NOTE — Progress Notes (Signed)
 Resp culture collected and sent to lab by RT,  RN aware, CCM MD notified.

## 2023-11-30 NOTE — TOC Progression Note (Signed)
 Transition of Care [facility]) - Progression Note    Patient Details  Name: [name] [name] MRN: [id number] Date of Birth: [birth year]  Transition of Care [facility] CM/SW Contact  Tom-Johnson, Elmire Amrein Daphne, RN Phone Number: 11/30/2023, 2:15 PM  Clinical Narrative:     Patient transferred to ICU yesterday 11/29/23 after a PEA distress and intubated. [name] requests care continues and patient be Trached. ENT consulted for Tracheostomy, GOC continues with Palliative. Continues on IV abx, Levo, Vasopressin.   Patient not Medically ready for discharge.  CM will continue to follow as patient progresses with care towards discharge.              Expected Discharge Plan: Home/Self Care Barriers to Discharge: Continued Medical Work up  Expected Discharge Plan and Services       Living arrangements for the past 2 months: Single Family Home                                       Social Determinants of Health (SDOH) Interventions SDOH Screenings   Food Insecurity: Patient Unable To Answer (11/21/2023)  Housing: Patient Unable To Answer (11/21/2023)  Transportation Needs: Patient Unable To Answer (11/21/2023)  Utilities: Patient Unable To Answer (11/21/2023)  Depression (PHQ2-9): Medium Risk (03/10/2022)  Social Connections: Patient Unable To Answer (11/21/2023)  Tobacco Use: Low Risk  (11/22/2023)    Readmission Risk Interventions    10/09/2023    2:45 PM  Readmission Risk Prevention Plan  Transportation Screening Complete  PCP or Specialist Appt within 3-5 Days Complete  HRI or Home Care Consult Complete  Social Work Consult for Recovery Care Planning/Counseling Complete  Palliative Care Screening Not Applicable  Medication Review [employer]) Complete

## 2023-11-30 NOTE — Plan of Care (Signed)
  Problem: Respiratory: Goal: Ability to maintain adequate ventilation will improve Outcome: Progressing   Problem: Clinical Measurements: Goal: Ability to maintain clinical measurements within normal limits will improve Outcome: Progressing   Problem: Clinical Measurements: Goal: Will remain free from infection Outcome: Progressing   Problem: Activity: Goal: Risk for activity intolerance will decrease Outcome: Progressing   Problem: Nutrition: Goal: Adequate nutrition will be maintained Outcome: Progressing

## 2023-11-30 NOTE — Progress Notes (Addendum)
 NAME:  [name] [name], MRN:  [id number], DOB:  [birth year], LOS: 10 ADMISSION DATE:  11/20/2023, CONSULTATION DATE:  6/2 REFERRING MD:  Dr. Maury Space, CHIEF COMPLAINT:  aspiration pna; sepsis   History of Present Illness:  Patient is a 89 yo M w/ pertinent PMH dementia, dysphagia s/p PEG tube, HTN, HLD, Hypothyroidism presents to [facility] on 5/26 w/ sepsis.  Patient bedbound at baseline. Patient having fever and chills. Also w/ increased urine frequency. Came to [facility] on 5/26 for further eval. Noted to be tachycardic and tachypneic. WBC 16 and PCT 2.6. LA 7.4. Cultures obtained, started on abx, given iv fluids. UA negative for UTI. CXR concerning for aspiration pna. Covid/flu/rsv negative. Admitted by TRH. Palliative care consulted.  On 6/2 patient with frequent secretions requiring nt suctioning. Minimal o2 requirements. ABG pending. PCCM consulted.  Pertinent  Medical History   Past Medical History:  Diagnosis Date   Back pain 10/2008   per MRI 11/06/2008 - severe spinal stenosis worse at L4-5, L3-4, with neural compression at those levels   BPH (benign prostatic hyperplasia)    Eczema    Gout    Hypertension    Hypothyroidism      Significant Hospital Events: Including procedures, antibiotic start and stop dates in addition to other pertinent events   5/26: admitted for sepsis for asp pna 6/2: less responsive; pccm consulted 6/3: pulmonary reconsulted 12-02-23: coded on progressive; moved to 85M with PCCM. Intubated. On levo/vaso. High peaks, got lavaged. Family is requesting full code.   Interim History / Subjective:  Resp arrest leading to cardiac arrest 12-02-23. Anemic this morning hgb 6/5 and receiving 1U PRBC.   Objective    Blood pressure (!) 131/53, pulse (!) 199, temperature 98.2 F (36.8 C), temperature source Axillary, resp. rate (!) 30, height 6' (1.829 m), weight 47.5 kg, SpO2 (!) 89%.    Vent Mode: PRVC FiO2 (%):  [50 %-100 %] 60 % Set Rate:  [18 bmp-24 bmp] 20 bmp Vt Set:  [620 mL]  620 mL PEEP:  [5 cmH20] 5 cmH20 Plateau Pressure:  [30 cmH20-33 cmH20] 31 cmH20   Intake/Output Summary (Last 24 hours) at 11/30/2023 0726 Last data filed at 11/30/2023 0700 Gross per 24 hour  Intake 3154.27 ml  Output 60 ml  Net 3094.27 ml   Filed Weights   11/28/23 0333 12/02/2023 1023 11/30/23 0405  Weight: 51.2 kg 35.7 kg 47.5 kg    Examination: Acute on chronically ill appearing, frail, elderly male laying in bed  Vented, coarse lung sounds bilaterally, moderate secretions, tachypneic  S1s2, no murmur, rub, gallop Abdomen flat, peg, +BS Extremities thin/cachectic appearing  Perrla, triggers vent, no withdrawal to painful stimulus   Resolved problem list   Assessment and Plan  In hospital cardiac arrest 2/2 respiratory arrest  Acute respiratory failure w/ hypoxia Aspiration pneumonia vs multifocal current pneumonia  Shock, likely mixed septic and now cardiogenic from arrest Acute kidney injury  Failure to thrive  Dysphagic s/p PEG tube placement  Dementia  Acute on chronic blood loss 2/2 anemia of chronic disease  Hypothyroidism  HTN   Noted to have been hypoxic and then asystole. Received 7 minutes CPR, epi x3. Intubated and return of ROSC. Transfer to ICU. Family wanting full code. Respiratory arrest likely in the setting of aspiration. Admitted initially with respiratory failure through to be recurrent pneumonia vs aspiration. He was on broad spectrum antibiotics. Received cefepime  >zosyn .   - obtain tracheal aspirate for speciation  - con't linezolid , meropenem coverage  -  titrate levo/vaso to MAP > 65  - full mechanical vent support, VAP/PAD in place - ultimately will need tracheostomy scheduling - will consult ENT today - worsening AKI in setting of recent arrest. Monitor I/O. Maintain renal perfusion, renally dose meds.  - would not consider him an ideal crrt/hd candidate. Would not offer this  - transfuse 1 U PRBC 6/5 AM, post-transfusion cbc  - dc free water   with normalization of sodium. Monitor  - con't synthroid   - tube feeds   Need for ongoing GOC discussion with family. Spoke with Lizbeth Right, NP with palliative 6/4 who will discuss with PMT team to assess if someone on their team can speak to cultural/religious aspects of this case.   Best Practice (right click and "Reselect all SmartList Selections" daily)  Diet/type: NPO, add tube feed  DVT prophylaxis: sq heparin  GI prophylaxis: H2B Lines: RUE PICC 5/29>  Foley:  Yes, and it is still needed Code Status:  Full  Last date of multidisciplinary goals of care discussion: 6/4 long discussion with family - full code   Labs   CBC: Recent Labs  Lab 11/24/23 0300 11/26/23 0326 11/27/23 1344 11/28/23 0340 11/28/23 0928 11/29/23 0732 11/29/23 1129 11/30/23 0343 11/30/23 0434  WBC 8.8 10.7* 12.0* 20.0* 20.3* 22.0*  --   --  18.1*  NEUTROABS 6.9 8.5* 9.7* 18.2* 17.9*  --   --   --   --   HGB 7.8* 8.0* 8.1* 7.6* 7.7* 7.4* 7.8* 6.8* 6.5*  HCT 26.6* 26.4* 27.6* 26.2* 26.4* 25.8* 23.0* 20.0* 21.6*  MCV 90.5 90.1 92.0 91.9 91.3 94.2  --   --  90.0  PLT 126* 171 237 262 274 323  --   --  258    Basic Metabolic Panel: Recent Labs  Lab 11/24/23 0300 11/25/23 0400 11/26/23 0326 11/27/23 1344 11/28/23 0340 11/28/23 0928 11/29/23 0732 11/29/23 1129 11/30/23 0343 11/30/23 0434  NA 145  --  149* 150* 152* 151* 147* 146* 140 137  K 5.0 5.2* 5.2* 5.9* 5.9* 5.7* 3.6 4.4 4.5 4.4  CL 114*  --  113* 111 116* 116* 122*  --   --  104  CO2 24  --  29 28 28 27  18*  --   --  19*  GLUCOSE 127*  --  137* 121* 83 84 163*  --   --  245*  BUN 73*  --  83* 91* 95* 97* 83*  --   --  103*  CREATININE 1.50*  --  1.50* 1.62* 1.79* 1.87* 1.63*  --   --  2.58*  CALCIUM 9.3  --  9.4 9.6 9.6 9.6 7.1*  --   --  8.3*  MG 2.5* 2.5* 2.5* 2.5* 2.7*  --  2.0  --   --   --   PHOS 4.1 4.6  --   --   --   --  4.0  --   --   --    GFR: Estimated Creatinine Clearance: 13.3 mL/min (A) (by C-G formula based on SCr of  2.58 mg/dL (H)). Recent Labs  Lab 11/27/23 1344 11/28/23 0340 11/28/23 0928 11/29/23 0732 11/30/23 0434  PROCALCITON 0.90  --  1.42  --   --   WBC 12.0* 20.0* 20.3* 22.0* 18.1*    Liver Function Tests: Recent Labs  Lab 11/27/23 1344 11/28/23 0340 11/28/23 0928  AST 36 29 26  ALT 32 30 26  ALKPHOS 81 77 76  BILITOT 0.6 0.8 0.5  PROT 6.6  6.6 6.8  ALBUMIN  1.6* 1.6* 1.6*   No results for input(s): "LIPASE", "AMYLASE" in the last 168 hours. No results for input(s): "AMMONIA" in the last 168 hours.   ABG    Component Value Date/Time   PHART 7.442 11/30/2023 0343   PCO2ART 24.9 (L) 11/30/2023 0343   PO2ART 72 (L) 11/30/2023 0343   HCO3 17.0 (L) 11/30/2023 0343   TCO2 18 (L) 11/30/2023 0343   ACIDBASEDEF 6.0 (H) 11/30/2023 0343   O2SAT 95 11/30/2023 0343     Coagulation Profile: No results for input(s): "INR", "PROTIME" in the last 168 hours.   Cardiac Enzymes: No results for input(s): "CKTOTAL", "CKMB", "CKMBINDEX", "TROPONINI" in the last 168 hours.   HbA1C: Hgb A1c MFr Bld  Date/Time Value Ref Range Status  04/17/2023 06:31 PM 5.4 4.8 - 5.6 % Final    Comment:    (NOTE) Pre diabetes:          5.7%-6.4%  Diabetes:              >6.4%  Glycemic control for   <7.0% adults with diabetes     CBG: Recent Labs  Lab 11/29/23 1021 11/29/23 1508 11/29/23 1922 11/29/23 2325 11/30/23 0314  GLUCAP 121* 116* 108* 116* 90       Critical care time: 40    Arlys Lamer [facility] 11/30/23 7:31 AM  Please see [email or web address] for pager details.  From 7A-7P if no response, please call [phone] After hours, please call ELink [phone]

## 2023-11-30 NOTE — Progress Notes (Addendum)
 eLink Physician-Brief Progress Note Patient Name: Eder Macek DOB: 12-01-1933 MRN: [id number]   Date of Service  11/30/2023  HPI/Events of Note  89 year old man with dementia and chronic dysphagia. Recurrent aspiration.  Had a cardiac arrest earlier today.  Reported Oliguria, bladder scan with less than 40 cc.  Currently on norepinephrine and vasopressin.  +2.5 L for the day, +4L  for the stay.  eICU Interventions  No intervention, continue monitoring   0417 -ABG reviewed with adequate ventilation and oxygenation.  A.m. labs ordered  0520 -hemoglobin 6.5, 1 unit PRBC ordered  Intervention Category Intermediate Interventions: Oliguria - evaluation and management  Jese Comella 11/30/2023, 1:51 AM

## 2023-12-01 DIAGNOSIS — R579 Shock, unspecified: Secondary | ICD-10-CM

## 2023-12-01 DIAGNOSIS — J9601 Acute respiratory failure with hypoxia: Secondary | ICD-10-CM

## 2023-12-01 LAB — BPAM RBC
Blood Product Expiration Date: 202506212359
ISSUE DATE / TIME: 202506050740
Unit Type and Rh: 7300

## 2023-12-01 LAB — BASIC METABOLIC PANEL WITH GFR
Anion gap: 16 — ABNORMAL HIGH (ref 5–15)
BUN: 115 mg/dL — ABNORMAL HIGH (ref 8–23)
CO2: 18 mmol/L — ABNORMAL LOW (ref 22–32)
Calcium: 8.6 mg/dL — ABNORMAL LOW (ref 8.9–10.3)
Chloride: 102 mmol/L (ref 98–111)
Creatinine, Ser: 3.05 mg/dL — ABNORMAL HIGH (ref 0.61–1.24)
GFR, Estimated: 19 mL/min — ABNORMAL LOW (ref >60)
Glucose, Bld: 141 mg/dL — ABNORMAL HIGH (ref 70–99)
Potassium: 4.9 mmol/L (ref 3.5–5.1)
Sodium: 136 mmol/L (ref 135–145)

## 2023-12-01 LAB — GLUCOSE, CAPILLARY
Glucose-Capillary: 85 mg/dL (ref 70–99)
Glucose-Capillary: 90 mg/dL (ref 70–99)
Glucose-Capillary: 103 mg/dL — ABNORMAL HIGH (ref 70–99)
Glucose-Capillary: 101 mg/dL — ABNORMAL HIGH (ref 70–99)
Glucose-Capillary: 117 mg/dL — ABNORMAL HIGH (ref 70–99)
Glucose-Capillary: 125 mg/dL — ABNORMAL HIGH (ref 70–99)

## 2023-12-01 LAB — TYPE AND SCREEN
ABO/RH(D): B POS
Antibody Screen: NEGATIVE
Unit division: 0

## 2023-12-01 LAB — VITAMIN B1: Vitamin B1 (Thiamine): 185.6 nmol/L (ref 66.5–200.0)

## 2023-12-01 MED ORDER — OXYCODONE HCL 5 MG PO TABS
5.0000 mg | ORAL_TABLET | Freq: Three times a day (TID) | ORAL | Status: DC
  Administered 2023-12-01 – 2023-12-10 (×17): 5 mg
  Filled 2023-12-01 (×18): qty 1

## 2023-12-01 MED ORDER — MIDODRINE HCL 5 MG PO TABS
10.0000 mg | ORAL_TABLET | Freq: Three times a day (TID) | ORAL | Status: DC
  Administered 2023-12-01 – 2023-12-02 (×3): 10 mg
  Filled 2023-12-01 (×3): qty 2

## 2023-12-01 MED ORDER — THIAMINE MONONITRATE 100 MG PO TABS
100.0000 mg | ORAL_TABLET | Freq: Every day | ORAL | Status: DC
  Administered 2023-12-01 – 2023-12-10 (×8): 100 mg
  Filled 2023-12-01 (×8): qty 1

## 2023-12-01 NOTE — Plan of Care (Signed)
  Problem: Clinical Measurements: Goal: Ability to maintain clinical measurements within normal limits will improve Outcome: Progressing   Problem: Clinical Measurements: Goal: Diagnostic test results will improve Outcome: Progressing   Problem: Clinical Measurements: Goal: Cardiovascular complication will be avoided Outcome: Progressing   Problem: Activity: Goal: Risk for activity intolerance will decrease Outcome: Progressing   Problem: Nutrition: Goal: Adequate nutrition will be maintained Outcome: Progressing   

## 2023-12-01 NOTE — Progress Notes (Signed)
 NAME:  [name] [name], MRN:  [id number], DOB:  Dec 21, 1933, LOS: 11 ADMISSION DATE:  11/20/2023, CONSULTATION DATE:  6/2 REFERRING MD:  Dr. Maury Space, CHIEF COMPLAINT:  aspiration pna; sepsis   History of Present Illness:  Patient is a 89 yo M w/ pertinent PMH dementia, dysphagia s/p PEG tube, HTN, HLD, Hypothyroidism presents to [facility] on 5/26 w/ sepsis.  Patient bedbound at baseline. Patient having fever and chills. Also w/ increased urine frequency. Came to [facility] on 5/26 for further eval. Noted to be tachycardic and tachypneic. WBC 16 and PCT 2.6. LA 7.4. Cultures obtained, started on abx, given iv fluids. UA negative for UTI. CXR concerning for aspiration pna. Covid/flu/rsv negative. Admitted by TRH. Palliative care consulted.  On 6/2 patient with frequent secretions requiring nt suctioning. Minimal o2 requirements. ABG pending. PCCM consulted.  Pertinent  Medical History   Past Medical History:  Diagnosis Date   Back pain 10/2008   per MRI 11/06/2008 - severe spinal stenosis worse at L4-5, L3-4, with neural compression at those levels   BPH (benign prostatic hyperplasia)    Eczema    Gout    Hypertension    Hypothyroidism      Significant Hospital Events: Including procedures, antibiotic start and stop dates in addition to other pertinent events   5/26: admitted for sepsis for asp pna 6/2: less responsive; pccm consulted 6/3: pulmonary reconsulted 28-Dec-2023: coded on progressive; moved to 81M with PCCM. Intubated. On levo/vaso. High peaks, got lavaged. Family is requesting full code.  6/5 Resp arrest leading to cardiac arrest 28-Dec-2023. Anemic this morning hgb 6/5 and receiving 1U PRBC.  6/6 remains on pressors with poor neurological status   Interim History / Subjective:  Unresponsive on ventilator  Objective    Blood pressure (!) 106/49, pulse 74, temperature 97.6 F (36.4 C), temperature source Oral, resp. rate 20, height 6' (1.829 m), weight 46.5 kg, SpO2 100%.    Vent Mode: PSV;CPAP FiO2  (%):  [40 %] 40 % Set Rate:  [20 bmp] 20 bmp Vt Set:  [620 mL] 620 mL PEEP:  [5 cmH20] 5 cmH20 Pressure Support:  [10 cmH20] 10 cmH20 Plateau Pressure:  [27 cmH20-31 cmH20] 27 cmH20   Intake/Output Summary (Last 24 hours) at 12/01/2023 0859 Last data filed at 12/01/2023 0800 Gross per 24 hour  Intake 3089.03 ml  Output 120 ml  Net 2969.03 ml   Filed Weights   12/28/23 1023 11/30/23 0405 12/01/23 0417  Weight: 35.7 kg 47.5 kg 46.5 kg    Examination: General: Acute on chronic ill-appearing severely deconditioned cachectic elderly male lying on mechanical ventilation in no acute distress HEENT: ETT, MM pink/moist, PERRL,  Neuro: Unresponsive on ventilator CV: s1s2 regular rate and rhythm, no murmur, rubs, or gallops,  PULM: To auscultation bilaterally, no increased work of breathing, no added breath sounds GI: soft, bowel sounds active in all 4 quadrants, non-tender, non-distended, tolerating TF Extremities: warm/dry, no edema  Skin: no rashes or lesions  Resolved problem list   Assessment and Plan  In hospital cardiac arrest 2/2 respiratory arrest  -Noted to have been hypoxic and then asystole. Received 7 minutes CPR, epi x3. Intubated and return of ROSC. P: Continuous telemetry Management of respiratory monitor as below Optimize electrolytes Supportive care  Acute respiratory failure w/ hypoxia Aspiration pneumonia vs multifocal pneumonia  P: Continue ventilator support with lung protective strategies  Wean PEEP and FiO2 for sats greater than 90%. Head of bed elevated 30 degrees. Plateau pressures less than 30 cm  H20.  Follow intermittent chest x-ray and ABG.   SAT/SBT as tolerated, mentation preclude extubation  Ensure adequate pulmonary hygiene  Follow cultures  VAP bundle in place  PAD protocol Continue linezolid  and meropenem  Shock, likely mixed septic and now cardiogenic from arrest P: Continue vent support as above Antibiotics as above Pressors for MAP  goal greater than 65 Monitor urine output Start midodrine PAD protocol, add low-dose scheduled Oxy to assist in weaning Precedex drip  Acute kidney injury  P: Renal function worse this a.m. creatinine up to 2.58 with GFR 23 Follow renal function  Monitor urine output Trend Bmet Avoid nephrotoxins Ensure adequate renal perfusion   Dementia  P: Maintain neuro protective measures Nutrition and bowel regiment  Aspirations precautions   Failure to thrive  Dysphagic s/p PEG tube placement  Severe protein calorie malnutrition Hyperlipidemia P: Continue tube feeds Supplement protein Consider supplementation of albumin  given ongoing hypotension  Acute on chronic blood loss 2/2 anemia of chronic disease  -Hemoglobin dropped 6.5 a.m. 6/5, s/p 1 unit PRBC P: Trend CBC Transfuse per protocol Hemoglobin goal greater than 7  Hypothyroidism  HTN  GOC Need for ongoing GOC discussion with family. Spoke with Lizbeth Right, NP with palliative 6/4 who will discuss with PMT team to assess if someone on their team can speak to cultural/religious aspects of this case.   Best Practice (right click and "Reselect all SmartList Selections" daily)  Diet/type: NPO, add tube feed  DVT prophylaxis: sq heparin  GI prophylaxis: H2B Lines: RUE PICC 5/29>  Foley:  Yes, and it is still needed Code Status:  Full  Last date of multidisciplinary goals of care discussion: 6/4 long discussion with family - full code   Critical care time:   CRITICAL CARE Performed by: Jaicey Sweaney D. Harris   Total critical care time: 40 minutes  Critical care time was exclusive of separately billable procedures and treating other patients.  Critical care was necessary to treat or prevent imminent or life-threatening deterioration.  Critical care was time spent personally by me on the following activities: development of treatment plan with patient and/or surrogate as well as nursing, discussions with consultants, evaluation  of patient's response to treatment, examination of patient, obtaining history from patient or surrogate, ordering and performing treatments and interventions, ordering and review of laboratory studies, ordering and review of radiographic studies, pulse oximetry and re-evaluation of patient's condition.  Eufemio Strahm D. Harris, NP-C [facility] Personal contact information can be found on Amion  If no contact or response made please call [phone] 12/01/2023, 9:11 AM

## 2023-12-01 NOTE — Progress Notes (Signed)
 Nutrition Follow-up  DOCUMENTATION CODES:   Severe malnutrition in context of chronic illness  INTERVENTION:  Continue TF via PEG: Jevity 1.5 at 42ml/hr ( per day) Provides 2160 kcal, 92g protein and free water  daily  NUTRITION DIAGNOSIS:   Severe Malnutrition related to chronic illness (dysphagia (PEG dependent) and dementia) as evidenced by percent weight loss, severe muscle depletion, severe fat depletion. - remains applicable  GOAL:   Patient will meet greater than or equal to 90% of their needs - progressing, addressing via TF re-advancement.   MONITOR:   TF tolerance, Skin, Weight trends  REASON FOR ASSESSMENT:   Consult Assessment of nutrition requirement/status, Enteral/tube feeding initiation and management  ASSESSMENT:   Pt with PMH significant for: dementia, dysphagia s/p PEG tube, HTN, HLD, hypothyroidism, gout, BPH. Presented with fever/chills, dysuria and increased frequency of urination. Found to be septic likely d/t aspiration PNA.  5/26: admitted with asp PNA 6/4: PEA arrest 2/2 respiratory status; intubated  Pt is now s/p cardiac arrest on 6/4 d/t respiratory arrest. He is now intubated and on ventilator support.   MV: 7.2 L/min Temp (24hrs), Avg:98 F (36.7 C), Min:97.4 F (36.3 C), Max:98.6 F (37 C) MAP (cuff) 71 MAP goal >65  ENT consult for tracheostomy placement.  Palliative continues to follow. Family continues to desire full scope of care.  Worsening renal function, though not appropriate for dialysis.   TF's restarted yesterday following hemodynamic improvement post arrest.  Resumed Jevity 1.5 at 46ml/hr and is currently infusing at goal rate of 69ml/hr. No reports of intolerance at this time.   Spoke with RN who reports pt is now having BM's with initiation of bowel regimen.   Admit weight: 60 kg Current weight: 46.5 kg + edema: mild pitting generalized, BUE, BLE   Drains/lines: UOP: x24 hours PEG  tube Double lumen PICC  Medications: pepcid daily, miralax  BID, senna BID, thiamine  Drips: Levo stopped  Labs:  BUN 115 Cr 3.05 Anion gap 16 GFR 19  CBG's 85-124 x24 hours  Diet Order:   Diet Order             Diet NPO time specified  Diet effective now                   EDUCATION NEEDS:   Not appropriate for education at this time  Skin:  Skin Integrity Issues:: DTI DTI: L thigh  Last BM:  6/6 type 6 x2  Height:   Ht Readings from Last 1 Encounters:  11/29/23 6' (1.829 m)    Weight:   Wt Readings from Last 1 Encounters:  12/01/23 46.5 kg    Ideal Body Weight:  80.9 kg  BMI:  Body mass index is 13.9 kg/m.  Estimated Nutritional Needs:   Kcal:  1800-2000 kcals  Protein:  90-110g  Fluid:  >1.8L/day  Rocklin Chute, RDN, LDN Clinical Nutrition See AMiON for contact information.

## 2023-12-01 NOTE — Consult Note (Signed)
 ENT CONSULT:  Reason for Consult: For tracheostomy placement  Referring Physician: ICU team  HPI: [name] [name] is an 89 y.o. male w/ h/o dementia, dysphagia s/p PEG, HTN, HLD, Hypothyroidism who is currently admitted for sepsis who ENT was consulted for tracheostomy tube placement.  History obtained from chart review and speaking with ICU team as patient is intubated and unable to answer questions or respond. Initially admitted on 11/20/2023 in context of possible UTI and possible sepsis. He was then noted to have possible Aspiration PNA given h/o recurrent aspirations and then had a PEA event requiring compression and intubation on 11/29/2023, presumed from aspiration. ICU team has had discussions with family regarding options and tracheostomy was brought up as a possible option for help with aspiration. Due to patient's anatomy with prominent clavicular heads, ENT was consulted.  Patient is currently intubated. No neck surgeries reported or noted in past. He is on abx and has AKI with kidney function worsening, may not be dialysis candidate.  On PSV 10/5 40%  Past Medical History:  Diagnosis Date   Back pain 10/2008   per MRI 11/06/2008 - severe spinal stenosis worse at L4-5, L3-4, with neural compression at those levels   BPH (benign prostatic hyperplasia)    Eczema    Gout    Hypertension    Hypothyroidism     Past Surgical History:  Procedure Laterality Date   ESOPHAGOGASTRODUODENOSCOPY N/A 04/03/2022   Procedure: ESOPHAGOGASTRODUODENOSCOPY (EGD);  Surgeon: Alvis Jourdain, MD;  Location: [facility] ENDOSCOPY;  Service: Gastroenterology;  Laterality: N/A;   IR REPLC GASTRO/COLONIC TUBE PERCUT W/FLUORO  09/29/2023   LAMINECTOMY  12/11/2008    Bilateral L2, L3, and L4 laminotomies, done by Dr. Larrie Po   LAPAROSCOPY N/A 04/06/2022   Procedure: LAPAROSCOPIC ASSISTED PERCUTANEOUS ENDOSCOPIC GASTROSTOMY PLACEMENT;  Surgeon: Dareen Ebbing, MD;  Location: [facility] OR;  Service: General;  Laterality: N/A;     Family History  Problem Relation Age of Onset   Stroke Neg Hx    Cancer Neg Hx     Social History:  reports that he has never smoked. He has never used smokeless tobacco. He reports that he does not drink alcohol and does not use drugs.  Allergies:  Allergies  Allergen Reactions   Pork-Derived Products Other (See Comments)    Religious Reasons    Medications: I have reviewed the patient's current medications.  Results for orders placed or performed during the hospital encounter of 11/20/23 (from the past 48 hours)  Glucose, capillary     Status: Abnormal   Collection Time: 11/29/23  7:22 PM  Result Value Ref Range   Glucose-Capillary 108 (H) 70 - 99 mg/dL    Comment: Glucose reference range applies only to samples taken after fasting for at least 8 hours.  Glucose, capillary     Status: Abnormal   Collection Time: 11/29/23 11:25 PM  Result Value Ref Range   Glucose-Capillary 116 (H) 70 - 99 mg/dL    Comment: Glucose reference range applies only to samples taken after fasting for at least 8 hours.  Glucose, capillary     Status: None   Collection Time: 11/30/23  3:14 AM  Result Value Ref Range   Glucose-Capillary 90 70 - 99 mg/dL    Comment: Glucose reference range applies only to samples taken after fasting for at least 8 hours.  I-STAT 7, (LYTES, BLD GAS, ICA, H+H)     Status: Abnormal   Collection Time: 11/30/23  3:43 AM  Result Value Ref Range  pH, Arterial 7.442 7.35 - 7.45   pCO2 arterial 24.9 (L) 32 - 48 mmHg   pO2, Arterial 72 (L) 83 - 108 mmHg   Bicarbonate 17.0 (L) 20.0 - 28.0 mmol/L   TCO2 18 (L) 22 - 32 mmol/L   O2 Saturation 95 %   Acid-base deficit 6.0 (H) 0.0 - 2.0 mmol/L   Sodium 140 135 - 145 mmol/L   Potassium 4.5 3.5 - 5.1 mmol/L   Calcium, Ion 1.20 1.15 - 1.40 mmol/L   HCT 20.0 (L) 39.0 - 52.0 %   Hemoglobin 6.8 (LL) 13.0 - 17.0 g/dL   Patient temperature 16.1 F    Collection site RADIAL, ALLEN'S TEST ACCEPTABLE    Drawn by RT    Sample  type ARTERIAL    Comment NOTIFIED PHYSICIAN   Basic metabolic panel     Status: Abnormal   Collection Time: 11/30/23  4:34 AM  Result Value Ref Range   Sodium 137 135 - 145 mmol/L    Comment: DELTA CHECK NOTED   Potassium 4.4 3.5 - 5.1 mmol/L   Chloride 104 98 - 111 mmol/L   CO2 19 (L) 22 - 32 mmol/L   Glucose, Bld 245 (H) 70 - 99 mg/dL    Comment: Glucose reference range applies only to samples taken after fasting for at least 8 hours.   BUN 103 (H) 8 - 23 mg/dL   Creatinine, Ser 0.96 (H) 0.61 - 1.24 mg/dL   Calcium 8.3 (L) 8.9 - 10.3 mg/dL   GFR, Estimated 23 (L) >60 mL/min    Comment: (NOTE) Calculated using the CKD-EPI Creatinine Equation (2021)    Anion gap 14 5 - 15    Comment: Performed at [facility], [address], [place], [place] [address]  CBC     Status: Abnormal   Collection Time: 11/30/23  4:34 AM  Result Value Ref Range   WBC 18.1 (H) 4.0 - 10.5 K/uL   RBC 2.40 (L) 4.22 - 5.81 MIL/uL   Hemoglobin 6.5 (LL) 13.0 - 17.0 g/dL    Comment: REPEATED TO VERIFY THIS CRITICAL RESULT HAS VERIFIED AND BEEN CALLED TO J.DANCER, RN BY ABEER ALTOM ON 06 05 2025 AT 0516, AND HAS BEEN READ BACK.     HCT 21.6 (L) 39.0 - 52.0 %   MCV 90.0 80.0 - 100.0 fL   MCH 27.1 26.0 - 34.0 pg   MCHC 30.1 30.0 - 36.0 g/dL   RDW 98.1 (H) 19.1 - 47.8 %   Platelets 258 150 - 400 K/uL   nRBC 0.0 0.0 - 0.2 %    Comment: Performed at [facility], [address], [place], [place] [address]  Type and screen [facility]     Status: None   Collection Time: 11/30/23  5:43 AM  Result Value Ref Range   ABO/RH(D) B POS    Antibody Screen NEG    Sample Expiration 12/03/2023,2359    Unit Number [id number]    Blood Component Type RBC LR PHER2    Unit division 00    Status of Unit ISSUED,FINAL    Transfusion Status OK TO TRANSFUSE    Crossmatch Result      Compatible Performed at [facility], [address] [place], [place] [address]   Prepare RBC  (crossmatch)     Status: None   Collection Time: 11/30/23  5:43 AM  Result Value Ref Range   Order Confirmation      ORDER  PROCESSED BY BLOOD BANK Performed at [facility], [address], [place], [place] [address]   Glucose, capillary     Status: None   Collection Time: 11/30/23  7:59 AM  Result Value Ref Range   Glucose-Capillary 85 70 - 99 mg/dL    Comment: Glucose reference range applies only to samples taken after fasting for at least 8 hours.  Culture, Respiratory w Gram Stain     Status: None (Preliminary result)   Collection Time: 11/30/23  8:16 AM   Specimen: Tracheal Aspirate; Respiratory  Result Value Ref Range   Specimen Description TRACHEAL ASPIRATE    Special Requests NONE    Gram Stain      ABUNDANT WBC PRESENT, PREDOMINANTLY PMN FEW BUDDING YEAST SEEN    Culture      CULTURE REINCUBATED FOR BETTER GROWTH Performed at [facility], [address], [place], [place] [address]    Report Status PENDING   Glucose, capillary     Status: Abnormal   Collection Time: 11/30/23 11:29 AM  Result Value Ref Range   Glucose-Capillary 114 (H) 70 - 99 mg/dL    Comment: Glucose reference range applies only to samples taken after fasting for at least 8 hours.  Hemoglobin and hematocrit, blood     Status: Abnormal   Collection Time: 11/30/23  2:39 PM  Result Value Ref Range   Hemoglobin 5.7 (LL) 13.0 - 17.0 g/dL    Comment: REPEATED TO VERIFY CRITICAL VALUE NOTED.  VALUE IS CONSISTENT WITH PREVIOUSLY REPORTED AND CALLED VALUE.    HCT 18.5 (L) 39.0 - 52.0 %    Comment: Performed at [facility], [address], [place], [place] [address]  Glucose, capillary     Status: None   Collection Time: 11/30/23  3:41 PM  Result Value Ref Range   Glucose-Capillary 94 70 - 99 mg/dL    Comment: Glucose reference range applies only to samples taken after fasting for at least 8 hours.  Hemoglobin and hematocrit, blood     Status: Abnormal   Collection Time: 11/30/23   3:44 PM  Result Value Ref Range   Hemoglobin 7.7 (L) 13.0 - 17.0 g/dL    Comment: REPEATED TO VERIFY POST TRANSFUSION SPECIMEN    HCT 24.6 (L) 39.0 - 52.0 %    Comment: Performed at [facility], [address] [place], [place] [address]  Glucose, capillary     Status: Abnormal   Collection Time: 11/30/23  7:43 PM  Result Value Ref Range   Glucose-Capillary 105 (H) 70 - 99 mg/dL    Comment: Glucose reference range applies only to samples taken after fasting for at least 8 hours.  Glucose, capillary     Status: Abnormal   Collection Time: 11/30/23 11:40 PM  Result Value Ref Range   Glucose-Capillary 124 (H) 70 - 99 mg/dL    Comment: Glucose reference range applies only to samples taken after fasting for at least 8 hours.  Glucose, capillary     Status: None   Collection Time: 12/01/23  3:50 AM  Result Value Ref Range   Glucose-Capillary 85 70 - 99 mg/dL    Comment: Glucose reference range applies only to samples taken after fasting for at least 8 hours.  Glucose, capillary     Status: None   Collection Time: 12/01/23  7:49 AM  Result Value Ref Range   Glucose-Capillary 90 70 - 99 mg/dL    Comment: Glucose reference range applies  only to samples taken after fasting for at least 8 hours.  Basic metabolic panel with GFR     Status: Abnormal   Collection Time: 12/01/23  8:35 AM  Result Value Ref Range   Sodium 136 135 - 145 mmol/L   Potassium 4.9 3.5 - 5.1 mmol/L   Chloride 102 98 - 111 mmol/L   CO2 18 (L) 22 - 32 mmol/L   Glucose, Bld 141 (H) 70 - 99 mg/dL    Comment: Glucose reference range applies only to samples taken after fasting for at least 8 hours.   BUN 115 (H) 8 - 23 mg/dL   Creatinine, Ser 1.61 (H) 0.61 - 1.24 mg/dL   Calcium 8.6 (L) 8.9 - 10.3 mg/dL   GFR, Estimated 19 (L) >60 mL/min    Comment: (NOTE) Calculated using the CKD-EPI Creatinine Equation (2021)    Anion gap 16 (H) 5 - 15    Comment: Performed at [name], [address],  [place], [place] [address]  Glucose, capillary     Status: Abnormal   Collection Time: 12/01/23 12:01 PM  Result Value Ref Range   Glucose-Capillary 103 (H) 70 - 99 mg/dL    Comment: Glucose reference range applies only to samples taken after fasting for at least 8 hours.  Glucose, capillary     Status: Abnormal   Collection Time: 12/01/23  4:11 PM  Result Value Ref Range   Glucose-Capillary 101 (H) 70 - 99 mg/dL    Comment: Glucose reference range applies only to samples taken after fasting for at least 8 hours.    No results found.  [id number] to obtain due to mental status  Blood pressure (!) 111/48, pulse 85, temperature (!) 97.4 F (36.3 C), temperature source Axillary, resp. rate 18, height 6' (1.829 m), weight 46.5 kg, SpO2 100%.  PHYSICAL EXAM:  CONSTITUTIONAL: cachectic male, intubated, does not follow commands CARDIOVASCULAR: normal rate PULMONARY/CHEST WALL: see above; ETT In place, respirations not labored HENT: Head : normocephalic Ears: Right ear:   canal normal, external ear normal Left ear:   canal normal, external ear normal Nose: nose normal and no purulence Mouth/Throat:  Mouth: no oral lesions noted, exam limited due to ETT NECK: supple, trachea normal and no thyromegaly or cervical LAD; landmarks easily palpable; noted prominent clavicular heads but there is some room midline and I am unable to appreciate a high innominate artery  Studies Reviewed: CBC shows WBC 18.1, Hgb 6.5, Plt 258 BMP with Bun/Cr 115/3.05 CXR 11/29/2023: ETT  in place, appears appropriate position; b/l patchy opacities CT CAP 10/06/2023 independently interpreted with respect to airway: patent, thin neck; no high riding innominate artery ntoed  Assessment/Plan: 89 y.o. with dysphagia who has a PEG now with:  Hypoxic respiratory failure (acute) Aspiration PNA - Intubated 11/29/2023. On minimal vent settings - Anatomically, he is a candidate for tracheostomy but given prominent clavicular heads,  more at risk of skin breakdown. - Given BMI and overall clinical picture, also at higher risk of poor wound healing and bleeding given kidney function  - Some literature to suggest that tracheostomy may not improve aspiration - Despite this, if family wish to pursue tracheostomy, ENT can perform; please notify us  - Would recommend minimal vent settings if decide to pursue and Hgb > 7 if ultimately wish to pursue to optimize him from surgical/airway/bleeding standpoint - Contact ENT with questions  Evelina Hippo   12/01/2023, 5:18 PM   MDM:  Level mod Time spent on  encounter, chart review, and note: 55 minutes

## 2023-12-01 NOTE — Progress Notes (Signed)
 Palliative Medicine Progress Note   Patient Name: [name] [name]       Date: 12/01/2023 DOB: [birth year]  Age: 89 y.o. MRN#: [id number] Attending Physician: Arlina Lair, MD Primary Care Physician: Lawrance Presume, MD Admit Date: 11/20/2023    HPI/Patient Profile: 89 y.o. male  with past medical history of dementia, dysphagia status post PEG tube, hypertension, hypothyroidism chronic CHF, BPH, and gout who presented to the ED on 11/20/2023 with fever, chills, urinary frequency, and concern for possible UTI.  In the ED, chest x-ray shows patchy left-sided groundglass airspace disease, improved since prior x-ray, consistent with residual or recurrent pneumonia versus aspiration. Initial lactic acid was 7.4.    Patient is admitted with septic shock secondary to aspiration pneumonia, AKI, lactic acidosis, and hypernatremia.   Palliative Medicine was consulted for goals of care and medical decision.    Subjective: Chart reviewed. On 6/4, patient had respiratory arrest leading to cardiac arrest and was intubated during the code.   Patient assessed at bedside. He is intubated and unresponsive.  I spoke with son/[name] [name] by phone. Ongoing conversation was had regarding there seriousness of patient's current medical situation. Reviewed that patient is intubated and in multi-organ failure since we last spoke. I shared my concern that patient's kidney function continues to worsen today, and that he most likely will not be a candidate for dialysis.   Son seems accepting of this, states it is in "God's hands now", the medical team has "done their part" and "that's life".   Discussed the importance of continued conversation with family and the medical team regarding overall plan of  care.   Objective:  Physical Exam Vitals reviewed.  Constitutional:      General: He is not in acute distress.    Appearance: He is cachectic. He is ill-appearing.  Cardiovascular:     Rate and Rhythm: Normal rate.  Pulmonary:     Comments: intubated Abdominal:     Comments: PEG  Neurological:     Mental Status: He is unresponsive.             Palliative Medicine Assessment & Plan   Assessment: Principal Problem:   Severe sepsis (HCC) Active Problems:   Hypothyroidism   Anemia, chronic disease   Essential hypertension   Hyperkalemia   Dysphagia   Aspiration pneumonia (HCC)  Elevated troponin   AKI (acute kidney injury) (HCC)   Protein-calorie malnutrition, severe   PEG (percutaneous endoscopic gastrostomy) status (HCC)   Failure to thrive in adult   Dehydration   Hypernatremia   Recurrent pneumonia    Family has been educated on patient's poor prognosis in the setting of advanced (end-stage dementia) and recurrent aspiration pneumonia.  Despite education, son has remained open to any available and offered medical interventions to prolong patient's life and believes this choice for full scope treatment is best for his father.  He has been devoted to keeping patient alive despite his debilitated and noncommunicative state.    Recommendations/Plan: Continue current interventions Do not offer or initiate medical interventions that would be medically ineffective PMT will continue to follow and support  Code Status: Full code   Prognosis:  poor  Discharge Planning: To Be Determined   Thank you for allowing the Palliative Medicine Team to assist in the care of this patient.   MDM - High  Detailed review of medical records (labs, imaging, vital signs), medically appropriate exam, discussed with treatment team, counseling and education to patient, family, & staff, documenting clinical information, coordination of care.    Inola Lisle B Viha Kriegel, NP   Please  contact Palliative Medicine Team phone at [phone] for questions and concerns.  For individual providers, please see AMION.

## 2023-12-02 ENCOUNTER — Inpatient Hospital Stay (HOSPITAL_COMMUNITY): Payer: MEDICAID

## 2023-12-02 LAB — CBC
HCT: 27 % — ABNORMAL LOW (ref 39.0–52.0)
Hemoglobin: 8.5 g/dL — ABNORMAL LOW (ref 13.0–17.0)
MCH: 27.3 pg (ref 26.0–34.0)
MCHC: 31.5 g/dL (ref 30.0–36.0)
MCV: 86.8 fL (ref 80.0–100.0)
Platelets: 263 10*3/uL (ref 150–400)
RBC: 3.11 MIL/uL — ABNORMAL LOW (ref 4.22–5.81)
RDW: 17.6 % — ABNORMAL HIGH (ref 11.5–15.5)
WBC: 17.7 10*3/uL — ABNORMAL HIGH (ref 4.0–10.5)
nRBC: 0 % (ref 0.0–0.2)

## 2023-12-02 LAB — BASIC METABOLIC PANEL WITH GFR
Anion gap: 12 (ref 5–15)
BUN: 126 mg/dL — ABNORMAL HIGH (ref 8–23)
CO2: 20 mmol/L — ABNORMAL LOW (ref 22–32)
Calcium: 8.6 mg/dL — ABNORMAL LOW (ref 8.9–10.3)
Chloride: 104 mmol/L (ref 98–111)
Creatinine, Ser: 3.43 mg/dL — ABNORMAL HIGH (ref 0.61–1.24)
GFR, Estimated: 16 mL/min — ABNORMAL LOW (ref >60)
Glucose, Bld: 110 mg/dL — ABNORMAL HIGH (ref 70–99)
Potassium: 5.4 mmol/L — ABNORMAL HIGH (ref 3.5–5.1)
Sodium: 136 mmol/L (ref 135–145)

## 2023-12-02 LAB — GLUCOSE, CAPILLARY
Glucose-Capillary: 124 mg/dL — ABNORMAL HIGH (ref 70–99)
Glucose-Capillary: 113 mg/dL — ABNORMAL HIGH (ref 70–99)
Glucose-Capillary: 118 mg/dL — ABNORMAL HIGH (ref 70–99)
Glucose-Capillary: 101 mg/dL — ABNORMAL HIGH (ref 70–99)
Glucose-Capillary: 80 mg/dL (ref 70–99)
Glucose-Capillary: 83 mg/dL (ref 70–99)

## 2023-12-02 LAB — CULTURE, RESPIRATORY W GRAM STAIN

## 2023-12-02 LAB — MAGNESIUM: Magnesium: 2.5 mg/dL — ABNORMAL HIGH (ref 1.7–2.4)

## 2023-12-02 MED ORDER — ORAL CARE MOUTH RINSE
15.0000 mL | OROMUCOSAL | Status: DC
  Administered 2023-12-02 – 2023-12-11 (×99): 15 mL via OROMUCOSAL

## 2023-12-02 MED ORDER — FENTANYL CITRATE PF 50 MCG/ML IJ SOSY: 25.0000 ug | PREFILLED_SYRINGE | INTRAMUSCULAR | Status: DC | PRN

## 2023-12-02 MED ORDER — ORAL CARE MOUTH RINSE: 15.0000 mL | OROMUCOSAL | Status: DC | PRN

## 2023-12-02 MED ORDER — FENTANYL CITRATE PF 50 MCG/ML IJ SOSY
25.0000 ug | PREFILLED_SYRINGE | INTRAMUSCULAR | Status: DC | PRN
  Administered 2023-12-02 – 2023-12-03 (×2): 50 ug via INTRAVENOUS
  Filled 2023-12-02: qty 1

## 2023-12-02 MED ORDER — POLYETHYLENE GLYCOL 3350 17 G PO PACK: 17.0000 g | PACK | Freq: Every day | ORAL | Status: DC

## 2023-12-02 MED ORDER — MIDODRINE HCL 5 MG PO TABS
5.0000 mg | ORAL_TABLET | Freq: Three times a day (TID) | ORAL | Status: DC
  Administered 2023-12-02 – 2023-12-03 (×3): 5 mg
  Filled 2023-12-02 (×4): qty 1

## 2023-12-02 MED ORDER — FENTANYL CITRATE PF 50 MCG/ML IJ SOSY
PREFILLED_SYRINGE | INTRAMUSCULAR | Status: AC
  Filled 2023-12-02: qty 1

## 2023-12-02 MED ORDER — DOCUSATE SODIUM 50 MG/5ML PO LIQD
100.0000 mg | Freq: Two times a day (BID) | ORAL | Status: DC
  Administered 2023-12-02 – 2023-12-09 (×7): 100 mg
  Filled 2023-12-02 (×7): qty 10

## 2023-12-02 NOTE — Progress Notes (Signed)
 PCCM progress note  Family updated at bedside this afternoon.  They were informed that patient's renal function continues to worsen with associated hyperkalemia.  Again family was informed that patient is a poor candidate for hemodialysis with risk outweighing benefit.  Therefore attempted another goals of care discussion with son but this was quickly shut down with son stating " keep doing everything"   [name] Mccleary D. Harris, NP-C [facility] Personal contact information can be found on Amion  If no contact or response made please call [phone] 12/02/2023, 6:06 PM

## 2023-12-02 NOTE — Plan of Care (Signed)
  Problem: Pain Managment: Goal: General experience of comfort will improve and/or be controlled Outcome: Progressing   Problem: Safety: Goal: Ability to remain free from injury will improve Outcome: Progressing   Problem: Clinical Measurements: Goal: Diagnostic test results will improve Outcome: Not Progressing   Problem: Education: Goal: Knowledge of General Education information will improve Description: Including pain rating scale, medication(s)/side effects and non-pharmacologic comfort measures Outcome: Not Progressing

## 2023-12-02 NOTE — Progress Notes (Addendum)
 NAME:  [name] [name], MRN:  [id number], DOB:  [birth year], LOS: 12 ADMISSION DATE:  11/20/2023, CONSULTATION DATE:  6/2 REFERRING MD:  Dr. Maury Space, CHIEF COMPLAINT:  aspiration pna; sepsis   History of Present Illness:  Patient is a 89 yo M w/ pertinent PMH dementia, dysphagia s/p PEG tube, HTN, HLD, Hypothyroidism presents to [facility] on 5/26 w/ sepsis.  Patient bedbound at baseline. Patient having fever and chills. Also w/ increased urine frequency. Came to [facility] on 5/26 for further eval. Noted to be tachycardic and tachypneic. WBC 16 and PCT 2.6. LA 7.4. Cultures obtained, started on abx, given iv fluids. UA negative for UTI. CXR concerning for aspiration pna. Covid/flu/rsv negative. Admitted by [facility]. Palliative care consulted.  On 6/2 patient with frequent secretions requiring nt suctioning. Minimal o2 requirements. ABG pending. PCCM consulted.  Pertinent  Medical History   Past Medical History:  Diagnosis Date   Back pain 10/2008   per MRI 11/06/2008 - severe spinal stenosis worse at L4-5, L3-4, with neural compression at those levels   BPH (benign prostatic hyperplasia)    Eczema    Gout    Hypertension    Hypothyroidism      Significant Hospital Events: Including procedures, antibiotic start and stop dates in addition to other pertinent events   5/26: admitted for sepsis for asp pna 6/2: less responsive; pccm consulted 6/3: pulmonary reconsulted Dec 28, 2023: coded on progressive; moved to 66M with PCCM. Intubated. On levo/vaso. High peaks, got lavaged. Family is requesting full code.  6/5 Resp arrest leading to cardiac arrest 2023/12/28. Anemic this morning hgb 6/5 and receiving 1U PRBC.  6/6 remains on pressors with poor neurological status  6/7 patient remains on ventilator with poor neurological function, will open eyes to verbal stimuli but unable to follow any commands.  ENT evaluated and patient is candidate for tracheostomy with tentative plan scheduled for 6/11  Interim History / Subjective:   Opens eyes to verbal stimuli this a.m.  Objective    Blood pressure (!) 120/46, pulse 86, temperature 97.8 F (36.6 C), temperature source Axillary, resp. rate (!) 22, height 6' (1.829 m), weight 46.5 kg, SpO2 99%.    Vent Mode: PSV;CPAP FiO2 (%):  [40 %] 40 % Set Rate:  [20 bmp] 20 bmp Vt Set:  [829 mL] 620 mL PEEP:  [5 cmH20] 5 cmH20 Pressure Support:  [10 cmH20] 10 cmH20 Plateau Pressure:  [27 cmH20] 27 cmH20   Intake/Output Summary (Last 24 hours) at 12/02/2023 1041 Last data filed at 12/02/2023 1000 Gross per 24 hour  Intake 1678.05 ml  Output 100 ml  Net 1578.05 ml   Filed Weights   28-Dec-2023 1023 11/30/23 0405 12/01/23 0417  Weight: 35.7 kg 47.5 kg 46.5 kg    Examination: General: Acute on chronic ill-appearing deconditioned severely cachectic elderly male lying in bed on mechanical ventilation no acute distress HEENT: ETT, MM pink/moist, PERRL,  Neuro: Opens eyes to verbal stimuli, unable to follow commands CV: s1s2 regular rate and rhythm, no murmur, rubs, or gallops,  PULM: Clear to auscultation bilaterally, no increased work of breathing, no added breath sounds GI: soft, bowel sounds active in all 4 quadrants, non-tender, non-distended, tolerating tube feeds via PEG tube Extremities: warm/dry, no edema  Skin: no rashes or lesions  Resolved problem list  Shock   Assessment and Plan  In hospital cardiac arrest 2/2 respiratory arrest  -Noted to have been hypoxic and then asystole. Received 7 minutes CPR, epi x3. Intubated and return of ROSC. P:  Continuous telemetry Optimize electrolytes Supportive care  Acute respiratory failure w/ hypoxia Aspiration pneumonia vs multifocal pneumonia  P: Continue ventilator support with lung protective strategies  Wean PEEP and FiO2 for sats greater than 90%. Head of bed elevated 30 degrees. Plateau pressures less than 30 cm H20.  Follow intermittent chest x-ray and ABG.   SAT/SBT as tolerated, mentation preclude  extubation  Ensure adequate pulmonary hygiene  Follow cultures  VAP bundle in place  PAD protocol Continue linezolid  and meropenem  throughout today to allow for 5 days of total broad-spectrum antibiotic coverage  Anuric acute kidney injury  -Renal function continues to worsen, GFR down to 16 a.m. 6/7 -Patient is a very poor candidate for hemodialysis P: Continue to monitor urine output Avoid nephrotoxins Trend BMP Ensure adequate renal perfusion  Dementia  P: Maintain neuroprotective measures Nutrition and bowel regiment Aspiration precautions  Failure to thrive  Dysphagic s/p PEG tube placement  Severe protein calorie malnutrition Hyperlipidemia P: Small volume emesis overnight resulting in discontinuation of tube feeds.  Will resume at lower dose with slow titration to goal Supplement protein  Acute on chronic blood loss 2/2 anemia of chronic disease  -Hemoglobin dropped 6.5 a.m. 6/5, s/p 1 unit PRBC P: Trend CBC Transfuse per protocol Hemoglobin goal greater than 7  Hypothyroidism  HTN  GOC Need for ongoing GOC discussion with family. Spoke with Lizbeth Right, NP with palliative 6/4 who will discuss with PMT team to assess if someone on their team can speak to cultural/religious aspects of this case.   Best Practice (right click and "Reselect all SmartList Selections" daily)  Diet/type: NPO, add tube feed  DVT prophylaxis: sq heparin  GI prophylaxis: H2B Lines: RUE PICC 5/29>  Foley:  Yes, and it is still needed Code Status:  Full  Last date of multidisciplinary goals of care discussion: 6/4 long discussion with family - full code   Critical care time:   CRITICAL CARE Performed by: Orrin Yurkovich D. Harris   Total critical care time: 38 minutes  Critical care time was exclusive of separately billable procedures and treating other patients.  Critical care was necessary to treat or prevent imminent or life-threatening deterioration.  Critical care was time spent  personally by me on the following activities: development of treatment plan with patient and/or surrogate as well as nursing, discussions with consultants, evaluation of patient's response to treatment, examination of patient, obtaining history from patient or surrogate, ordering and performing treatments and interventions, ordering and review of laboratory studies, ordering and review of radiographic studies, pulse oximetry and re-evaluation of patient's condition.  Rieley Hausman D. Harris, NP-C [facility] Personal contact information can be found on Amion  If no contact or response made please call [phone] 12/02/2023, 10:41 AM

## 2023-12-03 LAB — GLUCOSE, CAPILLARY
Glucose-Capillary: 77 mg/dL (ref 70–99)
Glucose-Capillary: 106 mg/dL — ABNORMAL HIGH (ref 70–99)
Glucose-Capillary: 66 mg/dL — ABNORMAL LOW (ref 70–99)
Glucose-Capillary: 98 mg/dL (ref 70–99)
Glucose-Capillary: 74 mg/dL (ref 70–99)
Glucose-Capillary: 84 mg/dL (ref 70–99)
Glucose-Capillary: 108 mg/dL — ABNORMAL HIGH (ref 70–99)

## 2023-12-03 LAB — BASIC METABOLIC PANEL WITH GFR
Anion gap: 12 (ref 5–15)
BUN: 136 mg/dL — ABNORMAL HIGH (ref 8–23)
CO2: 19 mmol/L — ABNORMAL LOW (ref 22–32)
Calcium: 8.2 mg/dL — ABNORMAL LOW (ref 8.9–10.3)
Chloride: 102 mmol/L (ref 98–111)
Creatinine, Ser: 3.6 mg/dL — ABNORMAL HIGH (ref 0.61–1.24)
GFR, Estimated: 16 mL/min — ABNORMAL LOW (ref >60)
Glucose, Bld: 93 mg/dL (ref 70–99)
Potassium: 6.3 mmol/L (ref 3.5–5.1)
Sodium: 133 mmol/L — ABNORMAL LOW (ref 135–145)

## 2023-12-03 LAB — POCT I-STAT 7, (LYTES, BLD GAS, ICA,H+H)
Acid-base deficit: 11 mmol/L — ABNORMAL HIGH (ref 0.0–2.0)
Bicarbonate: 19.6 mmol/L — ABNORMAL LOW (ref 20.0–28.0)
Calcium, Ion: 1.16 mmol/L (ref 1.15–1.40)
HCT: 26 % — ABNORMAL LOW (ref 39.0–52.0)
Hemoglobin: 8.8 g/dL — ABNORMAL LOW (ref 13.0–17.0)
O2 Saturation: 71 %
Patient temperature: 99
Potassium: 6.8 mmol/L (ref 3.5–5.1)
Sodium: 132 mmol/L — ABNORMAL LOW (ref 135–145)
TCO2: 22 mmol/L (ref 22–32)
pCO2 arterial: 70.8 mmHg (ref 32–48)
pH, Arterial: 7.052 — CL (ref 7.35–7.45)
pO2, Arterial: 55 mmHg — ABNORMAL LOW (ref 83–108)

## 2023-12-03 LAB — CULTURE, BLOOD (ROUTINE X 2)
Culture: NO GROWTH
Culture: NO GROWTH
Special Requests: ADEQUATE
Special Requests: ADEQUATE

## 2023-12-03 MED ORDER — NOREPINEPHRINE 4 MG/250ML-% IV SOLN: 0.0000 ug/min | INTRAVENOUS | Status: DC

## 2023-12-03 MED ORDER — DEXTROSE 50 % IV SOLN
12.5000 g | Freq: Once | INTRAVENOUS | Status: AC
  Administered 2023-12-03: 12.5 g via INTRAVENOUS

## 2023-12-03 MED ORDER — CALCIUM GLUCONATE-NACL 1-0.675 GM/50ML-% IV SOLN
1.0000 g | Freq: Once | INTRAVENOUS | Status: AC
  Administered 2023-12-03: 1000 mg via INTRAVENOUS
  Filled 2023-12-03: qty 50

## 2023-12-03 MED ORDER — SODIUM ZIRCONIUM CYCLOSILICATE 10 G PO PACK
10.0000 g | PACK | Freq: Once | ORAL | Status: AC
  Administered 2023-12-03: 10 g
  Filled 2023-12-03: qty 1

## 2023-12-03 MED ORDER — SODIUM CHLORIDE 0.9 % IV SOLN
250.0000 mL | INTRAVENOUS | Status: AC
  Administered 2023-12-03: 250 mL via INTRAVENOUS

## 2023-12-03 MED ORDER — MIDAZOLAM HCL 2 MG/2ML IJ SOLN: 1.0000 mg | INTRAMUSCULAR | Status: DC | PRN

## 2023-12-03 MED ORDER — INSULIN ASPART 100 UNIT/ML IV SOLN
5.0000 [IU] | Freq: Once | INTRAVENOUS | Status: AC
  Administered 2023-12-03: 5 [IU] via INTRAVENOUS

## 2023-12-03 MED ORDER — DEXTROSE 50 % IV SOLN
INTRAVENOUS | Status: AC
  Filled 2023-12-03: qty 50

## 2023-12-03 MED ORDER — FENTANYL 2500MCG IN NS 250ML (10MCG/ML) PREMIX INFUSION
0.0000 ug/h | INTRAVENOUS | Status: DC
  Administered 2023-12-03: 50 ug/h via INTRAVENOUS
  Administered 2023-12-06: 25 ug/h via INTRAVENOUS
  Administered 2023-12-09 – 2023-12-10 (×2): 100 ug/h via INTRAVENOUS
  Filled 2023-12-03 (×5): qty 250

## 2023-12-03 MED ORDER — MIDODRINE HCL 5 MG PO TABS
10.0000 mg | ORAL_TABLET | Freq: Three times a day (TID) | ORAL | Status: DC
  Administered 2023-12-03: 10 mg via ORAL
  Filled 2023-12-03 (×2): qty 2

## 2023-12-03 MED ORDER — NOREPINEPHRINE 4 MG/250ML-% IV SOLN
INTRAVENOUS | Status: AC
  Administered 2023-12-03: 2 ug/min via INTRAVENOUS
  Filled 2023-12-03: qty 250

## 2023-12-03 MED ORDER — DEXTROSE 5 % IV SOLN: INTRAVENOUS | Status: DC

## 2023-12-03 MED ORDER — FENTANYL BOLUS VIA INFUSION
25.0000 ug | INTRAVENOUS | Status: DC | PRN
  Administered 2023-12-05: 25 ug via INTRAVENOUS
  Administered 2023-12-09 – 2023-12-10 (×2): 50 ug via INTRAVENOUS
  Administered 2023-12-10: 75 ug via INTRAVENOUS

## 2023-12-03 MED ORDER — SODIUM BICARBONATE 8.4 % IV SOLN
50.0000 meq | Freq: Once | INTRAVENOUS | Status: AC
  Administered 2023-12-03: 50 meq via INTRAVENOUS
  Filled 2023-12-03: qty 50

## 2023-12-03 MED ORDER — NOREPINEPHRINE 4 MG/250ML-% IV SOLN
0.0000 ug/min | INTRAVENOUS | Status: DC
  Administered 2023-12-03: 15 ug/min via INTRAVENOUS
  Administered 2023-12-03: 10 ug/min via INTRAVENOUS
  Administered 2023-12-04 (×2): 15 ug/min via INTRAVENOUS
  Filled 2023-12-03 (×2): qty 250
  Filled 2023-12-03: qty 500

## 2023-12-03 MED ORDER — SODIUM BICARBONATE 8.4 % IV SOLN
50.0000 meq | Freq: Once | INTRAVENOUS | Status: AC
Start: 2023-12-03 — End: 2023-12-03
  Administered 2023-12-03: 50 meq via INTRAVENOUS
  Filled 2023-12-03: qty 50

## 2023-12-03 MED ORDER — FENTANYL CITRATE PF 50 MCG/ML IJ SOSY: 25.0000 ug | PREFILLED_SYRINGE | Freq: Once | INTRAMUSCULAR | Status: DC

## 2023-12-03 MED ORDER — DEXTROSE 50 % IV SOLN
1.0000 | Freq: Once | INTRAVENOUS | Status: AC
  Administered 2023-12-03: 50 mL via INTRAVENOUS
  Filled 2023-12-03: qty 50

## 2023-12-03 NOTE — Progress Notes (Signed)
 eLink Physician-Brief Progress Note Patient Name: [name] [name] DOB: 04/01/1934 MRN: [id number]   Date of Service  12/03/2023  HPI/Events of Note  Critical K+ 6.5 w/ ceat 3.60 and GFR 16. NPO and PICC, G-tube, no mention of hemolysis on blood sampling. Co2 at  19. Worsening renal function   eICU Interventions  Hyperkalemia protocol ordered     Intervention Category Intermediate Interventions: Electrolyte abnormality - evaluation and management  Rexann Catalan 12/03/2023, 5:01 AM

## 2023-12-03 NOTE — Progress Notes (Signed)
 Approximately of previously ordered fentanyl  gtt waste by Donna Fus, RN and this RN.

## 2023-12-03 NOTE — Progress Notes (Signed)
 RT called d/t increased PIP up to 40s.  RT lavaged and removed some secretions from the ETT but pressures remained in 40s on full support.  RT switched pt to wean but required 15/5 (rather than 10/5 he weaned on throughout the day) to achieve acceptable volumes.  Pt still breathing in the 40s and PIP still in 40s.  RT drew ABG w following results 7.05/70.8/55/19.6 with sat at 71%.  RN notified, who notified PA.  RT awaiting further instruction at this time.

## 2023-12-03 NOTE — Progress Notes (Signed)
 NAME:  [name] [name], MRN:  [id number], DOB:  Sep 02, 1933, LOS: 13 ADMISSION DATE:  11/20/2023, CONSULTATION DATE:  6/2 REFERRING MD:  Dr. Maury Space, CHIEF COMPLAINT:  aspiration pna; sepsis   History of Present Illness:  Patient is a 89 yo M w/ pertinent PMH dementia, dysphagia s/p PEG tube, HTN, HLD, Hypothyroidism presents to [facility] on 5/26 w/ sepsis.  Patient bedbound at baseline. Patient having fever and chills. Also w/ increased urine frequency. Came to [facility] on 5/26 for further eval. Noted to be tachycardic and tachypneic. WBC 16 and PCT 2.6. LA 7.4. Cultures obtained, started on abx, given iv fluids. UA negative for UTI. CXR concerning for aspiration pna. Covid/flu/rsv negative. Admitted by TRH. Palliative care consulted.  On 6/2 patient with frequent secretions requiring nt suctioning. Minimal o2 requirements. ABG pending. PCCM consulted.  Pertinent  Medical History   Past Medical History:  Diagnosis Date   Back pain 10/2008   per MRI 11/06/2008 - severe spinal stenosis worse at L4-5, L3-4, with neural compression at those levels   BPH (benign prostatic hyperplasia)    Eczema    Gout    Hypertension    Hypothyroidism     Significant Hospital Events: Including procedures, antibiotic start and stop dates in addition to other pertinent events   5/26: admitted for sepsis for asp pna 6/2: less responsive; pccm consulted 6/3: pulmonary reconsulted December 01, 2023: coded on progressive; moved to 47M with PCCM. Intubated. On levo/vaso. High peaks, got lavaged. Family is requesting full code.  6/5 Resp arrest leading to cardiac arrest 12/01/2023. Anemic this morning hgb 6/5 and receiving 1U PRBC.  6/6 remains on pressors with poor neurological status  6/7 patient remains on ventilator with poor neurological function, will open eyes to verbal stimuli but unable to follow any commands.  ENT evaluated and patient is candidate for tracheostomy with tentative plan scheduled for 6/11 6/8 renal function continues to worsen  with severe hyperkalemia on a.m. labs and severely elevated BUN of 136  Interim History / Subjective:  Unresponsive on ventilator  Objective    Blood pressure (!) 114/45, pulse 89, temperature (!) 96.6 F (35.9 C), temperature source Axillary, resp. rate 20, height 6' (1.829 m), weight 48 kg, SpO2 100%.    Vent Mode: PRVC FiO2 (%):  [40 %-50 %] 40 % Set Rate:  [20 bmp] 20 bmp Vt Set:  [620 mL] 620 mL PEEP:  [5 cmH20] 5 cmH20 Pressure Support:  [10 cmH20] 10 cmH20 Plateau Pressure:  [33 cmH20-37 cmH20] 33 cmH20   Intake/Output Summary (Last 24 hours) at 12/03/2023 0713 Last data filed at 12/03/2023 0534 Gross per 24 hour  Intake 400 ml  Output 85 ml  Net 315 ml   Filed Weights   11/30/23 0405 12/01/23 0417 12/03/23 0500  Weight: 47.5 kg 46.5 kg 48 kg    Examination: General: On chronic ill-appearing severely deconditioned frail cachectic elderly male lying in bed on mechanical ventilation in no acute distress HEENT: ETT, MM pink/moist, PERRL,  Neuro: Unresponsive on ventilator CV: s1s2 regular rate and rhythm, no murmur, rubs, or gallops,  PULM: Clear to auscultation bilaterally, no increased work of breathing, no added breath sounds, tolerating ventilator GI: soft, bowel sounds active in all 4 quadrants, non-tender, non-distended, tolerating TF Extremities: warm/dry, no edema  Skin: no rashes or lesions  Resolved problem list  Shock   Assessment and Plan  In hospital cardiac arrest 2/2 respiratory arrest  -Noted to have been hypoxic and then asystole. Received 7 minutes CPR,  epi x3. Intubated and return of ROSC. P: Continuous telemetry Optimize electrolytes Supportive care  Acute respiratory failure w/ hypoxia Aspiration pneumonia vs multifocal pneumonia  P: Mentation worse this a.m. with severe uremia Continue ventilator support with lung protective strategies  Wean PEEP and FiO2 for sats greater than 90%. Head of bed elevated 30 degrees. Plateau pressures less  than 30 cm H20.  Follow intermittent chest x-ray and ABG.   SAT/SBT as tolerated, mentation preclude extubation  Ensure adequate pulmonary hygiene  Follow cultures  VAP bundle in place  PAD protocol Monitor off antibiotics  Anuric acute kidney injury with signs of progression to renal failure -Renal function continues to worsen, GFR down to 16 a.m. 6/7 -Patient is a very poor candidate for hemodialysis Hyperkalemia -Potassium 6.3 6/8 Severe uremia -BUN 6/8 136 P: Patient's renal function continues to worsen with severe uremia and hyperkalemia.  Family has been educated/informed of patient's worsening renal function.  Patient remains a poor candidate for hemodialysis therefore underlying renal function will continue to progressively worsen as well as hyperkalemia.  At this time we will continue to gently medically manage hyperkalemia but patient will ultimately become severely hyperkalemic and likely experienced another cardiac arrest  Dementia  P: Maintain neuroprotective measures Nutrition and bowel regiment Aspiration precautions  Failure to thrive  Dysphagic s/p PEG tube placement  Severe protein calorie malnutrition Hyperlipidemia P: Continues to tolerate PEG tube tube feeds  Acute on chronic blood loss 2/2 anemia of chronic disease  -Hemoglobin dropped 6.5 a.m. 6/5, s/p 1 unit PRBC P: Trend CBC Transfuse per protocol Hemoglobin goal greater than 7  GOC Continue to communicate with family for ongoing goals of care discussion in the setting of renal failure.  At this time family continues to wish for aggressive interventions with full code  Best Practice (right click and "Reselect all SmartList Selections" daily)  Diet/type: NPO, add tube feed  DVT prophylaxis: sq heparin  GI prophylaxis: H2B Lines: RUE PICC 5/29>  Foley:  Yes, and it is still needed Code Status:  Full  Last date of multidisciplinary goals of care discussion: 6/4 long discussion with family - full  code   Critical care time:   CRITICAL CARE Performed by: Eveny Anastas D. Harris   Total critical care time: 35 minutes  Critical care time was exclusive of separately billable procedures and treating other patients.  Critical care was necessary to treat or prevent imminent or life-threatening deterioration.  Critical care was time spent personally by me on the following activities: development of treatment plan with patient and/or surrogate as well as nursing, discussions with consultants, evaluation of patient's response to treatment, examination of patient, obtaining history from patient or surrogate, ordering and performing treatments and interventions, ordering and review of laboratory studies, ordering and review of radiographic studies, pulse oximetry and re-evaluation of patient's condition.  Brennan Karam D. Harris, NP-C [facility] Personal contact information can be found on Amion  If no contact or response made please call [phone] 12/03/2023, 7:13 AM

## 2023-12-03 NOTE — Progress Notes (Signed)
 eLink Physician-Brief Progress Note Patient Name: [name] [name] DOB: 03-14-1934 MRN: [id number]   Date of Service  12/03/2023  HPI/Events of Note  On chart review, K noted to be 6.8 Received Lokelma  this AM as well as shifted for K of 6.3 ABG 7.052/71/55 not much room to adjust vent setting as peak pressure elevated and patient already breathing over the set rate with MV 15  eICU Interventions  Ordered D50/insulin , calcium gluconate and bicarbonate push Prognosis poor Discussed with bedside RN     Intervention Category Intermediate Interventions: Electrolyte abnormality - evaluation and management  [name] [name] 12/03/2023, 7:22 PM

## 2023-12-03 NOTE — Progress Notes (Signed)
 PCCM progress note  Called to bedside for decompensation with hypoxia and hypotension.  ABG consistent with significant acidosis and hypercapnia attempted to place patient back on full ventilator support but peak pressures were significantly elevated.  Decision made to resume fentanyl  drip and vasopressor support as patient remains full with full scope of care.  Keymiah Lyles D. Harris, NP-C [facility] Personal contact information can be found on Amion  If no contact or response made please call [phone] 12/03/2023, 6:50 PM

## 2023-12-04 LAB — POCT I-STAT 7, (LYTES, BLD GAS, ICA,H+H)
Acid-base deficit: 9 mmol/L — ABNORMAL HIGH (ref 0.0–2.0)
Acid-base deficit: 10 mmol/L — ABNORMAL HIGH (ref 0.0–2.0)
Bicarbonate: 19.2 mmol/L — ABNORMAL LOW (ref 20.0–28.0)
Bicarbonate: 18.5 mmol/L — ABNORMAL LOW (ref 20.0–28.0)
Calcium, Ion: 1.13 mmol/L — ABNORMAL LOW (ref 1.15–1.40)
Calcium, Ion: 1.12 mmol/L — ABNORMAL LOW (ref 1.15–1.40)
HCT: 26 % — ABNORMAL LOW (ref 39.0–52.0)
HCT: 25 % — ABNORMAL LOW (ref 39.0–52.0)
Hemoglobin: 8.8 g/dL — ABNORMAL LOW (ref 13.0–17.0)
Hemoglobin: 8.5 g/dL — ABNORMAL LOW (ref 13.0–17.0)
O2 Saturation: 95 %
O2 Saturation: 94 %
Patient temperature: 97.5
Patient temperature: 98
Potassium: 5.6 mmol/L — ABNORMAL HIGH (ref 3.5–5.1)
Potassium: 5.7 mmol/L — ABNORMAL HIGH (ref 3.5–5.1)
Sodium: 127 mmol/L — ABNORMAL LOW (ref 135–145)
Sodium: 129 mmol/L — ABNORMAL LOW (ref 135–145)
TCO2: 21 mmol/L — ABNORMAL LOW (ref 22–32)
TCO2: 20 mmol/L — ABNORMAL LOW (ref 22–32)
pCO2 arterial: 54.8 mmHg — ABNORMAL HIGH (ref 32–48)
pCO2 arterial: 54.4 mmHg — ABNORMAL HIGH (ref 32–48)
pH, Arterial: 7.149 — CL (ref 7.35–7.45)
pH, Arterial: 7.139 — CL (ref 7.35–7.45)
pO2, Arterial: 94 mmHg (ref 83–108)
pO2, Arterial: 92 mmHg (ref 83–108)

## 2023-12-04 LAB — CBC
HCT: 25.6 % — ABNORMAL LOW (ref 39.0–52.0)
Hemoglobin: 7.5 g/dL — ABNORMAL LOW (ref 13.0–17.0)
MCH: 26.4 pg (ref 26.0–34.0)
MCHC: 29.3 g/dL — ABNORMAL LOW (ref 30.0–36.0)
MCV: 90.1 fL (ref 80.0–100.0)
Platelets: 278 10*3/uL (ref 150–400)
RBC: 2.84 MIL/uL — ABNORMAL LOW (ref 4.22–5.81)
RDW: 17.6 % — ABNORMAL HIGH (ref 11.5–15.5)
WBC: 13.6 10*3/uL — ABNORMAL HIGH (ref 4.0–10.5)
nRBC: 0.4 % — ABNORMAL HIGH (ref 0.0–0.2)

## 2023-12-04 LAB — GLUCOSE, CAPILLARY
Glucose-Capillary: 100 mg/dL — ABNORMAL HIGH (ref 70–99)
Glucose-Capillary: 305 mg/dL — ABNORMAL HIGH (ref 70–99)
Glucose-Capillary: 316 mg/dL — ABNORMAL HIGH (ref 70–99)
Glucose-Capillary: 50 mg/dL — ABNORMAL LOW (ref 70–99)
Glucose-Capillary: 57 mg/dL — ABNORMAL LOW (ref 70–99)
Glucose-Capillary: 59 mg/dL — ABNORMAL LOW (ref 70–99)
Glucose-Capillary: 70 mg/dL (ref 70–99)
Glucose-Capillary: 86 mg/dL (ref 70–99)
Glucose-Capillary: 87 mg/dL (ref 70–99)

## 2023-12-04 LAB — BASIC METABOLIC PANEL WITH GFR
Anion gap: 14 (ref 5–15)
BUN: 133 mg/dL — ABNORMAL HIGH (ref 8–23)
CO2: 18 mmol/L — ABNORMAL LOW (ref 22–32)
Calcium: 7.6 mg/dL — ABNORMAL LOW (ref 8.9–10.3)
Chloride: 90 mmol/L — ABNORMAL LOW (ref 98–111)
Creatinine, Ser: 3.78 mg/dL — ABNORMAL HIGH (ref 0.61–1.24)
GFR, Estimated: 15 mL/min — ABNORMAL LOW (ref >60)
Glucose, Bld: 421 mg/dL — ABNORMAL HIGH (ref 70–99)
Potassium: 5.6 mmol/L — ABNORMAL HIGH (ref 3.5–5.1)
Sodium: 122 mmol/L — ABNORMAL LOW (ref 135–145)

## 2023-12-04 MED ORDER — DEXTROSE-SODIUM CHLORIDE 5-0.9 % IV SOLN: INTRAVENOUS | Status: AC

## 2023-12-04 MED ORDER — NOREPINEPHRINE 16 MG/250ML-% IV SOLN
0.0000 ug/min | INTRAVENOUS | Status: DC
  Administered 2023-12-04: 13 ug/min via INTRAVENOUS
  Administered 2023-12-05: 18 ug/min via INTRAVENOUS
  Administered 2023-12-06: 12 ug/min via INTRAVENOUS
  Administered 2023-12-07: 24 ug/min via INTRAVENOUS
  Administered 2023-12-07 – 2023-12-11 (×11): 35 ug/min via INTRAVENOUS
  Filled 2023-12-04 (×16): qty 250

## 2023-12-04 MED ORDER — DEXTROSE 50 % IV SOLN
INTRAVENOUS | Status: AC
  Administered 2023-12-04: 25 mL
  Filled 2023-12-04: qty 50

## 2023-12-04 MED ORDER — DEXTROSE 50 % IV SOLN: 12.5000 g | Freq: Once | INTRAVENOUS | Status: AC

## 2023-12-04 MED ORDER — ACETAMINOPHEN 325 MG PO TABS: 650.0000 mg | ORAL_TABLET | Freq: Four times a day (QID) | ORAL | Status: DC | PRN

## 2023-12-04 MED ORDER — POLYETHYLENE GLYCOL 3350 17 G PO PACK: 17.0000 g | PACK | Freq: Two times a day (BID) | ORAL | Status: DC | PRN

## 2023-12-04 MED ORDER — ACETAMINOPHEN 650 MG RE SUPP: 650.0000 mg | Freq: Four times a day (QID) | RECTAL | Status: DC | PRN

## 2023-12-04 MED ORDER — MIDODRINE HCL 5 MG PO TABS
10.0000 mg | ORAL_TABLET | Freq: Three times a day (TID) | ORAL | Status: DC
  Administered 2023-12-05 – 2023-12-06 (×6): 10 mg
  Filled 2023-12-04 (×7): qty 2

## 2023-12-04 NOTE — Progress Notes (Signed)
 NAME:  [name] [name], MRN:  [id number], DOB:  May 13, 1934, LOS: 14 ADMISSION DATE:  11/20/2023, CONSULTATION DATE:  6/2 REFERRING MD:  Dr. Maury Space, CHIEF COMPLAINT:  aspiration pna; sepsis   History of Present Illness:  Patient is a 89 yo M w/ pertinent PMH dementia, dysphagia s/p PEG tube, HTN, HLD, Hypothyroidism presents to [name] on 5/26 w/ sepsis.  Patient bedbound at baseline. Patient having fever and chills. Also w/ increased urine frequency. Came to [name] on 5/26 for further eval. Noted to be tachycardic and tachypneic. WBC 16 and PCT 2.6. LA 7.4. Cultures obtained, started on abx, given iv fluids. UA negative for UTI. CXR concerning for aspiration pna. Covid/flu/rsv negative. Admitted by TRH. Palliative care consulted.  On 6/2 patient with frequent secretions requiring nt suctioning. Minimal o2 requirements. ABG pending. PCCM consulted.  Pertinent  Medical History   Past Medical History:  Diagnosis Date   Back pain 10/2008   per MRI 11/06/2008 - severe spinal stenosis worse at L4-5, L3-4, with neural compression at those levels   BPH (benign prostatic hyperplasia)    Eczema    Gout    Hypertension    Hypothyroidism     Significant Hospital Events: Including procedures, antibiotic start and stop dates in addition to other pertinent events   5/26: admitted for sepsis for asp pna 6/2: less responsive; pccm consulted 6/3: pulmonary reconsulted 28-Dec-2023: coded on progressive; moved to 58M with PCCM. Intubated. On levo/vaso. High peaks, got lavaged. Family is requesting full code.  6/5 Resp arrest leading to cardiac arrest 2023-12-28. Anemic this morning hgb 6/5 and receiving 1U PRBC.  6/6 remains on pressors with poor neurological status  6/7 patient remains on ventilator with poor neurological function, will open eyes to verbal stimuli but unable to follow any commands.  ENT evaluated and patient is candidate for tracheostomy with tentative plan scheduled for 6/11 6/8 renal function continues to worsen  with severe hyperkalemia on a.m. labs and severely elevated BUN of 136 6/9 patient having worsening renal failure with hyperkalemia-temporizing measures given to assist in reversal of elevated K, patient on high ventilatory support  Interim History / Subjective:  Remains unresponsive on ventilator Positive Trousseau sign-right wrist Continue to have worsening renal failure  Objective    Blood pressure (!) 141/52, pulse 94, temperature (!) 97.5 F (36.4 C), temperature source Axillary, resp. rate 20, height 6' (1.829 m), weight 54.8 kg, SpO2 100%.    Vent Mode: PRVC FiO2 (%):  [40 %-100 %] 70 % Set Rate:  [20 bmp] 20 bmp Vt Set:  [620 mL] 620 mL PEEP:  [5 cmH20] 5 cmH20 Pressure Support:  [10 cmH20-15 cmH20] 15 cmH20 Plateau Pressure:  [33 cmH20-37 cmH20] 34 cmH20   Intake/Output Summary (Last 24 hours) at 12/04/2023 1246 Last data filed at 12/04/2023 4098 Gross per 24 hour  Intake --  Output 25 ml  Net -25 ml   Filed Weights   12/01/23 0417 12/03/23 0500 12/04/23 0345  Weight: 46.5 kg 48 kg 54.8 kg    Examination: General: acute on chronic cachectic, severely deconditioned elderly male ,lying in icu bed on vent-nonresponsive on ventilator, in no distress HEENT: Normocephalic, PERRLA intact-very sluggish ETT,Pink MM CV: s1,s2, RRR, no MRG, No JVD  pulm: diminished, no distress on vent  Abs: bs hypoactive, distended, PEG tube in place  Extremities: Skin: no rash  Neuro: Rass -4, does not responds to painful stimuli, cough gag reflex present, positive Trousseau sign-right wrist  GU: foley intact , anuric  Resolved problem list  Shock   Assessment and Plan  In hospital cardiac arrest 2/2 respiratory arrest  -Noted to have been hypoxic and then asystole. Received 7 minutes CPR, epi x3. Intubated and return of ROSC. P: Continuous telemetry Continue to trend and optimize electrolytes Continue supportive care Continue goals of care conversations with family-son  Acute  respiratory failure w/ hypoxia Aspiration pneumonia vs multifocal pneumonia  Patient unresponsive on vent, no cough gag reflex -Patient had worsening hypotension//hypoxia along with hypercapnia on 6/8 afternoon  - needing to be placed back on full support per vent  P:  Continue ventilator support and lung protective strategies  Continue LTVV  Wean PEEP and Fio2 requirements to sat goal of >92%  HOB > 30 degrees Plat < 30  Aim for Driving pressures < 15  Intermittent Chest X-ray and ABGS VAP and PAD protocols in place  Continue full support at this time, will obtain ABG follow up with results  Continue GOC with Son   Anuric acute kidney injury with signs of progression to renal failure -Renal function continues to worsen, GFR down to 16 a.m. 6/7 -Patient is a very poor candidate for hemodialysis Hyperkalemia -Potassium 6.3 6/8 AM labs, at 1816 K 6.8-patient given insulin , D50, sodium bicarb, dextrose  IV to assist in reversal Severe uremia -BUN 6/8 136 6/8 Patient's renal function continues to worsen with severe uremia and hyperkalemia.  Family has been educated/informed of patient's worsening renal function.  Patient remains a poor candidate for hemodialysis therefore underlying renal function will continue to progressively worsen as well as hyperkalemia.  At this time we will continue to gently medically manage hyperkalemia but patient will ultimately become severely hyperkalemic and likely experienced another cardiac arrest P:  Continue goals of care update in regards to continue renal failure As above, feel that patient will continue to have worsening renal function and will possibly experience cardiac arrest secondary to electrolyte abnormalities such as hyperkalemia despite-temporarily reversing Continue to check the net along with electrolytes daily  Dementia  P: Continue supportive care and neuroprotective measures Continue aspiration precautions  Failure to thrive   Dysphagic s/p PEG tube placement  Severe protein calorie malnutrition Hyperlipidemia P: Having issues with regurgitation/vomiting overnight and into this morning-hold off tube feeds at this time Place PEG tube to suction-for decompression Continue goals of care as above Change D5W to D5 normal saline to help with hypoglycemia  Acute on chronic blood loss 2/2 anemia of chronic disease  -Hemoglobin dropped 6.5 a.m. 6/5, s/p 1 unit PRBC P: Continue to trend CBC Continue transfusion per protocol, transfuse for hemoglobin less than 7  GOC Continue to communicate with family for ongoing goals of care discussion in the setting of renal failure.  At this time family continues to wish for aggressive interventions with full code 6/9 Updated son with worsening renal function along with multisystem organ failure. Per Patient's culture/religion: Son agrees that we are doing everything for him and that God (Allah) will determine time of patient's death. Son states that in their religion that suffering is necessary at times to cleanse soul so that patient may enter heaven and be with God.  P: Continue to update family and provide goals of care  Best Practice (right click and "Reselect all SmartList Selections" daily)  Diet/type: NPO, add tube feed  DVT prophylaxis: sq heparin  GI prophylaxis: H2B Lines: RUE PICC 5/29 Foley:  Yes, and it is still needed Code Status:  Full  Last date of multidisciplinary goals of  care discussion: 6/4 long discussion with family - full code  Updated son 6/9 of worsening renal failure   Critical care time: 35 mins    CRITICAL CARE Rey Catholic AGACNP-BC   [facility] 12/04/2023, 1:10 PM  Please see [email or web address] for pager details.  From 7A-7P if no response, please call [phone]. After hours, please call ELink [phone].

## 2023-12-04 NOTE — Consult Note (Signed)
 WOC Nurse wound follow up Wound type: DTPI L trochanter  Measurement: 5 cm x 6 cm  Wound bed: purple maroon discoloration with epidermal lifting, tissue beneath is red/moist with additional purple maroon discoloration  at center   Drainage (amount, consistency, odor) none Periwound: intact Dressing procedure/placement/frequency: Cleanse L hip wound with soap and water , dry and apply Xeroform gauze ([place]son [phone]) to wound bed daily, cover with silicone foam.   WOC team will continue to follow patient weekly, if new needs arise please re-consult.  Gillermo Lack, RN, MSN, [facility] WOC Team

## 2023-12-04 NOTE — Progress Notes (Addendum)
 eLink Physician-Brief Progress Note Patient Name: [name] [name] DOB: Sep 07, 1933 MRN: [id number]   Date of Service  12/04/2023  HPI/Events of Note  Elink hand off follow up of BMP, K levels.  Full code, in MOF. Family is aware of poor prognosis, criticality.   Labs reviewed. Last blood work is from noon.     eICU Interventions  Get BMP, ABG .      Intervention Category Intermediate Interventions: Other:  Rexann Catalan  12/04/2023, 10:36 PM  23:25  ABG: Reviewed, Hco3 at 18. Metabolic and resp acidosis. Same as noon ABG. BMP pending.

## 2023-12-04 NOTE — Progress Notes (Signed)
   12/04/23 1100  Spiritual Encounters  Type of Visit Follow up  Care provided to: Family  Referral source Chaplain team  Reason for visit Routine spiritual support  OnCall Visit No  Spiritual Framework  Presenting Themes Significant life change  Community/Connection Faith community;Family;Spiritual leader  Family Stress Factors Health changes  Interventions  Spiritual Care Interventions Made Established relationship of care and support;Compassionate presence;Reflective listening;Prayer  Intervention Outcomes  Outcomes Connection to spiritual care;Awareness of support  Spiritual Care Plan  Spiritual Care Issues Still Outstanding Chaplain will continue to follow    Chaplain made follow up visit. Patient's stepdaughter was at the bedside. She mentioned that her husband remained with pt during the night and she came in the morning. She stated that pt was a healthy person who exercises regularly and eat healthy. His health began to decline after he had Covid three months ago, and he subsequently developed pneumonia.   Chaplain listened to her attentively, provided compassionate presence, asked open-ended questions and offered brief prayer.  Chaplain will be available if needed.   M.Kubra Welby Hale Resident [phone]

## 2023-12-05 DIAGNOSIS — E162 Hypoglycemia, unspecified: Secondary | ICD-10-CM

## 2023-12-05 LAB — BASIC METABOLIC PANEL WITH GFR
Anion gap: 15 (ref 5–15)
Anion gap: 15 (ref 5–15)
BUN: 136 mg/dL — ABNORMAL HIGH (ref 8–23)
BUN: 137 mg/dL — ABNORMAL HIGH (ref 8–23)
CO2: 15 mmol/L — ABNORMAL LOW (ref 22–32)
CO2: 16 mmol/L — ABNORMAL LOW (ref 22–32)
Calcium: 7.1 mg/dL — ABNORMAL LOW (ref 8.9–10.3)
Calcium: 7.4 mg/dL — ABNORMAL LOW (ref 8.9–10.3)
Chloride: 100 mmol/L (ref 98–111)
Chloride: 99 mmol/L (ref 98–111)
Creatinine, Ser: 3.86 mg/dL — ABNORMAL HIGH (ref 0.61–1.24)
Creatinine, Ser: 3.92 mg/dL — ABNORMAL HIGH (ref 0.61–1.24)
GFR, Estimated: 14 mL/min — ABNORMAL LOW (ref >60)
GFR, Estimated: 14 mL/min — ABNORMAL LOW (ref >60)
Glucose, Bld: 342 mg/dL — ABNORMAL HIGH (ref 70–99)
Glucose, Bld: 375 mg/dL — ABNORMAL HIGH (ref 70–99)
Potassium: 5.2 mmol/L — ABNORMAL HIGH (ref 3.5–5.1)
Potassium: 5.3 mmol/L — ABNORMAL HIGH (ref 3.5–5.1)
Sodium: 129 mmol/L — ABNORMAL LOW (ref 135–145)
Sodium: 131 mmol/L — ABNORMAL LOW (ref 135–145)

## 2023-12-05 LAB — POCT I-STAT 7, (LYTES, BLD GAS, ICA,H+H)
Acid-base deficit: 10 mmol/L — ABNORMAL HIGH (ref 0.0–2.0)
Bicarbonate: 17.5 mmol/L — ABNORMAL LOW (ref 20.0–28.0)
Calcium, Ion: 1.11 mmol/L — ABNORMAL LOW (ref 1.15–1.40)
HCT: 23 % — ABNORMAL LOW (ref 39.0–52.0)
Hemoglobin: 7.8 g/dL — ABNORMAL LOW (ref 13.0–17.0)
O2 Saturation: 96 %
Patient temperature: 96.6
Potassium: 5.5 mmol/L — ABNORMAL HIGH (ref 3.5–5.1)
Sodium: 127 mmol/L — ABNORMAL LOW (ref 135–145)
TCO2: 19 mmol/L — ABNORMAL LOW (ref 22–32)
pCO2 arterial: 42.8 mmHg (ref 32–48)
pH, Arterial: 7.214 — ABNORMAL LOW (ref 7.35–7.45)
pO2, Arterial: 92 mmHg (ref 83–108)

## 2023-12-05 LAB — GLUCOSE, CAPILLARY
Glucose-Capillary: 101 mg/dL — ABNORMAL HIGH (ref 70–99)
Glucose-Capillary: 110 mg/dL — ABNORMAL HIGH (ref 70–99)
Glucose-Capillary: 112 mg/dL — ABNORMAL HIGH (ref 70–99)
Glucose-Capillary: 240 mg/dL — ABNORMAL HIGH (ref 70–99)
Glucose-Capillary: 48 mg/dL — ABNORMAL LOW (ref 70–99)
Glucose-Capillary: 68 mg/dL — ABNORMAL LOW (ref 70–99)
Glucose-Capillary: 70 mg/dL (ref 70–99)
Glucose-Capillary: 79 mg/dL (ref 70–99)

## 2023-12-05 LAB — CBC
HCT: 23.7 % — ABNORMAL LOW (ref 39.0–52.0)
Hemoglobin: 7.2 g/dL — ABNORMAL LOW (ref 13.0–17.0)
MCH: 27.3 pg (ref 26.0–34.0)
MCHC: 30.4 g/dL (ref 30.0–36.0)
MCV: 89.8 fL (ref 80.0–100.0)
Platelets: 175 10*3/uL (ref 150–400)
RBC: 2.64 MIL/uL — ABNORMAL LOW (ref 4.22–5.81)
RDW: 17.4 % — ABNORMAL HIGH (ref 11.5–15.5)
WBC: 19.4 10*3/uL — ABNORMAL HIGH (ref 4.0–10.5)
nRBC: 0.1 % (ref 0.0–0.2)

## 2023-12-05 MED ORDER — DEXTROSE 50 % IV SOLN
INTRAVENOUS | Status: AC
  Administered 2023-12-05: 50 mL
  Filled 2023-12-05: qty 50

## 2023-12-05 MED ORDER — DEXTROSE 10 % IV SOLN: INTRAVENOUS | Status: DC

## 2023-12-05 NOTE — Plan of Care (Signed)
  Problem: Clinical Measurements: Goal: Diagnostic test results will improve Outcome: Not Progressing Goal: Signs and symptoms of infection will decrease Outcome: Not Progressing   Problem: Respiratory: Goal: Ability to maintain adequate ventilation will improve Outcome: Not Progressing   Problem: Education: Goal: Knowledge of General Education information will improve Description: Including pain rating scale, medication(s)/side effects and non-pharmacologic comfort measures Outcome: Not Progressing   Problem: Clinical Measurements: Goal: Ability to maintain clinical measurements within normal limits will improve Outcome: Not Progressing Goal: Will remain free from infection Outcome: Not Progressing Goal: Diagnostic test results will improve Outcome: Not Progressing Goal: Respiratory complications will improve Outcome: Not Progressing Goal: Cardiovascular complication will be avoided Outcome: Not Progressing   Problem: Activity: Goal: Risk for activity intolerance will decrease Outcome: Not Progressing   Problem: Nutrition: Goal: Adequate nutrition will be maintained Outcome: Not Progressing

## 2023-12-05 NOTE — Progress Notes (Signed)
 Hypoglycemic Event  CBG: 48  Treatment: D50 50 mL (25 gm)  Symptoms: None  Follow-up CBG: Time: 0740 CBG Result: 101  Possible Reasons for Event: Other: Tube feed stopped during the night due to intolerance.     Luismiguel Lamere J Trent Gabler

## 2023-12-05 NOTE — Progress Notes (Signed)
 Nutrition Follow-up  DOCUMENTATION CODES:   Severe malnutrition in context of chronic illness  INTERVENTION:  When appropriate, continue TF via PEG: Jevity 1.5 at 83ml/hr ( per day)  Provides 2160 kcal, 92g protein and free water  daily  Monitor ongoing GOC discussions  NUTRITION DIAGNOSIS:  Severe Malnutrition related to chronic illness (dysphagia (PEG dependent) and dementia) as evidenced by percent weight loss, severe muscle depletion, severe fat depletion. - remains applicable  GOAL:  Patient will meet greater than or equal to 90% of their needs - met w/ tube feed at goal rate  MONITOR:  TF tolerance, Skin, Weight trends  REASON FOR ASSESSMENT:  Consult Assessment of nutrition requirement/status, Enteral/tube feeding initiation and management  ASSESSMENT:  Pt with PMH significant for: dementia, dysphagia s/p PEG tube, HTN, HLD, hypothyroidism, gout, BPH. Presented with fever/chills, dysuria and increased frequency of urination. Found to be septic likely d/t aspiration PNA.  5/26: admitted with asp PNA 6/4: PEA arrest 2/2 respiratory status; intubated 6/8: back on full vent support 2/2 hypotension/hypoxia, and hypercapnia  6/9: holding TF d/t intolerance  Pt is now s/p cardiac arrest on 6/4 d/t respiratory arrest. He is now intubated and on ventilator support. Having issues with vomiting/regurgitation overnight and thus tube feeds are currently on hold. PEG placed to suction for decompression.   MV: 13.7 L/min Temp (24hrs), Avg:97.9 F (36.6 C), Min:97.5 F (36.4 C), Max:98.6 F (37 C) MAP (cuff) 75 MAP goal >65  Spoke with Charity fundraiser. Continue to hold feedings today. Palliative continues to follow. Family continues to desire full scope of care. Worsening renal function. Temporary reversing of hyperkalemia in place, but not sustainable. Electrolyte checks daily. D5W changed to D5 normal saline to aid in managing his hypoglycemia.    Admit weight: 60 kg Current  weight: 48.6 kg + edema: moderate pitting to BLEs and deep pitting to BLEs   Drains/lines: UOP: 6ml x24 hours - ? accuracy PEG tube Double lumen PICC   Medications: pepcid  daily, miralax  BID, senna BID, thiamine  Drips: Levo 18 Fentanyl  25   Labs:  Na+ 129 L K+ 5.2  BUN 136 Cr 3.86 GFR 14 CBG's 342-375 x24 hours  Diet Order:   Diet Order             Diet NPO time specified  Diet effective now            EDUCATION NEEDS:  Not appropriate for education at this time  Skin:  Skin Integrity Issues:: DTI DTI: L thigh  Last BM:  6/9: FMS: output x24 hours  Height:  Ht Readings from Last 1 Encounters:  11/29/23 6' (1.829 m)   Weight:  Wt Readings from Last 1 Encounters:  12/05/23 48.6 kg   Ideal Body Weight:  80.9 kg  BMI:  Body mass index is 14.53 kg/m.  Estimated Nutritional Needs:   Kcal:  1800-2000 kcals  Protein:  90-110g  Fluid:  >1.8L/day  Con Decant MS, RD, LDN Registered Dietitian Clinical Nutrition RD Inpatient Contact Info in Amion

## 2023-12-05 NOTE — TOC Progression Note (Signed)
 Transition of Care [name]) - Progression Note    Patient Details  Name: [name] [name] MRN: [id number] Date of Birth: 09/26/1933  Transition of Care [facility]) CM/SW Contact  Tom-Johnson, Angelique Ken, RN Phone Number: 12/05/2023, 12:42 PM  Clinical Narrative:     Patient continues to be intubated with worsening Renal Failure and in Multisystem Organ Failure. Per MD, family updated and aware of critical care status.   CM will continue to follow.         Expected Discharge Plan: Home/Self Care Barriers to Discharge: Continued Medical Work up  Expected Discharge Plan and Services       Living arrangements for the past 2 months: Single Family Home                                       Social Determinants of Health (SDOH) Interventions SDOH Screenings   Food Insecurity: Patient Unable To Answer (11/21/2023)  Housing: Patient Unable To Answer (11/21/2023)  Transportation Needs: Patient Unable To Answer (11/21/2023)  Utilities: Patient Unable To Answer (11/21/2023)  Depression (PHQ2-9): Medium Risk (03/10/2022)  Social Connections: Patient Unable To Answer (11/21/2023)  Tobacco Use: Low Risk  (11/22/2023)    Readmission Risk Interventions    10/09/2023    2:45 PM  Readmission Risk Prevention Plan  Transportation Screening Complete  PCP or Specialist Appt within 3-5 Days Complete  HRI or Home Care Consult Complete  Social Work Consult for Recovery Care Planning/Counseling Complete  Palliative Care Screening Not Applicable  Medication Review Oceanographer) Complete

## 2023-12-05 NOTE — Progress Notes (Signed)
 NAME:  [name] [name], MRN:  [id number], DOB:  October 20, 1933, LOS: 15 ADMISSION DATE:  11/20/2023, CONSULTATION DATE:  6/2 REFERRING MD:  Dr. Maury Space, CHIEF COMPLAINT:  aspiration pna; sepsis   History of Present Illness:  Patient is a 89 yo M w/ pertinent PMH dementia, dysphagia s/p PEG tube, HTN, HLD, Hypothyroidism presents to [facility] on 5/26 w/ sepsis.  Patient bedbound at baseline. Patient having fever and chills. Also w/ increased urine frequency. Came to [facility] on 5/26 for further eval. Noted to be tachycardic and tachypneic. WBC 16 and PCT 2.6. LA 7.4. Cultures obtained, started on abx, given iv fluids. UA negative for UTI. CXR concerning for aspiration pna. Covid/flu/rsv negative. Admitted by TRH. Palliative care consulted.  On 6/2 patient with frequent secretions requiring nt suctioning. Minimal o2 requirements. ABG pending. PCCM consulted.  Pertinent  Medical History   Past Medical History:  Diagnosis Date   Back pain 10/2008   per MRI 11/06/2008 - severe spinal stenosis worse at L4-5, L3-4, with neural compression at those levels   BPH (benign prostatic hyperplasia)    Eczema    Gout    Hypertension    Hypothyroidism     Significant Hospital Events: Including procedures, antibiotic start and stop dates in addition to other pertinent events   5/26: admitted for sepsis for asp pna 6/2: less responsive; pccm consulted 6/3: pulmonary reconsulted 12-01-23: coded on progressive; moved to 34M with PCCM. Intubated. On levo/vaso. High peaks, got lavaged. Family is requesting full code.  6/5 Resp arrest leading to cardiac arrest 12/01/23. Anemic this morning hgb 6/5 and receiving 1U PRBC.  6/6 remains on pressors with poor neurological status  6/7 patient remains on ventilator with poor neurological function, will open eyes to verbal stimuli but unable to follow any commands.  ENT evaluated and patient is candidate for tracheostomy with tentative plan scheduled for 6/11 6/8 renal function continues to worsen  with severe hyperkalemia on a.m. labs and severely elevated BUN of 136 6/9 patient having worsening renal failure with hyperkalemia-temporizing measures given to assist in reversal of elevated K, patient on high ventilatory support 6/10 Patient in Multisystem organ failure, family update and aware of critical care status   Interim History / Subjective:  Remains unresponsive on vent  Daughter in law at beside   Objective    Blood pressure (!) 145/47, pulse 85, temperature 98.1 F (36.7 C), temperature source Axillary, resp. rate (!) 22, height 6' (1.829 m), weight 48.6 kg, SpO2 100%.    Vent Mode: PRVC FiO2 (%):  [40 %-70 %] 40 % Set Rate:  [20 bmp-22 bmp] 22 bmp Vt Set:  [620 mL] 620 mL PEEP:  [5 cmH20] 5 cmH20 Plateau Pressure:  [32 cmH20-37 cmH20] 37 cmH20   Intake/Output Summary (Last 24 hours) at 12/05/2023 1110 Last data filed at 12/05/2023 0800 Gross per 24 hour  Intake 1606.71 ml  Output 706 ml  Net 900.71 ml   Filed Weights   12/03/23 0500 12/04/23 0345 12/05/23 0500  Weight: 48 kg 54.8 kg 48.6 kg    Examination: General: acute on chronic cachetic/severe deconditioned elderly male, lying in icu bed on vent- non responsive on vent, NAD HEENT: Normocephalic, PERRLA intact-very sluggish, ETT, Pink MM CV: s1,s2, RRR, no MRG, No JVD  pulm: clear, diminished, no distress on vent  Abs: bs active, soft  Extremities: no edema, no deformity, cachexia  Skin: no rash  Neuro: Rass -4, does not responds to painful stimuli, cough gag reflex present  GU:  foley intact, anuric   Resolved problem list  Shock   Assessment and Plan  In hospital cardiac arrest 2/2 respiratory arrest  -Noted to have been hypoxic and then asystole. Received 7 minutes CPR, epi x3. Intubated and return of ROSC. P: Continuous telemetry Continue to trend and optimize electrolytes  Continue supportive care Continue GOC conversation with family-son, see below  Acute respiratory failure w/  hypoxia Aspiration pneumonia vs multifocal pneumonia  Patient unresponsive on vent, no cough gag reflex -Patient had worsening hypotension//hypoxia along with hypercapnia on 6/8 afternoon  - needing to be placed back on full support per vent  P:  Continue ventilator support and lung protective strategies  Continue LTVV  Wean PEEP and Fio2 requirements to sat goal of >92%  HOB > 30 degrees Plat < 30  Aim for Driving pressures < 15  Intermittent Chest X-ray and ABG VAP and PAD protocols in place  Repeat ABG Unable to wean at this time, patient critically ill, have had GOC conversations with son and daughter in law   Anuric acute kidney injury with signs of progression to renal failure -Renal function continues to worsen, GFR down to 16 a.m. 6/7 -Patient is a very poor candidate for hemodialysis Hyperkalemia -Potassium 6.3 6/8 AM labs, at 1816 K 6.8-patient given insulin , D50, sodium bicarb, dextrose  IV to assist in reversal Severe uremia -BUN 6/8 136 6/8 Patient's renal function continues to worsen with severe uremia and hyperkalemia.  Family has been educated/informed of patient's worsening renal function.  Patient remains a poor candidate for hemodialysis therefore underlying renal function will continue to progressively worsen as well as hyperkalemia.  At this time we will continue to gently medically manage hyperkalemia but patient will ultimately become severely hyperkalemic and likely experienced another cardiac arrest P:  Continue GOC with family -Have discussed with family on 6/9 and 6/10 that patient will more than likely experience cardiac arrest secondary to electrolyte abnormalities such as hyperkalemia-despite temporarily reversing  Continue to check with electrolytes daily   Dementia  P: Continue supportive care/neuro protective measures  Continue aspiration precautions   Failure to thrive  Dysphagic s/p PEG tube placement  Severe protein calorie  malnutrition Hyperlipidemia Hypoglycemia  6/9 Having issues with regurgitation/vomiting overnight and into this morning-hold off tube feeds at this time. Place PEG tube to suction-for decompression P: Continue GOC of care as above  Continue D5 Ns at 45ml/hr to assist with hypoglycemia If patient continues to have hypoglycemia-will place patient on d10 gtt   Acute on chronic blood loss 2/2 anemia of chronic disease  -Hemoglobin dropped 6.5 a.m. 6/5, s/p 1 unit PRBC 6/10 hgb 7.2 P: Continue to trend CBC Continue transfusion per protocol/hemoglobin less than 7   GOC Continue to communicate with family for ongoing goals of care discussion in the setting of renal failure.  At this time family continues to wish for aggressive interventions with full code 6/9 Updated son with worsening renal function along with multisystem organ failure. Per Patient's culture/religion: Son agrees that we are doing everything for him and that God (Allah) will determine time of patient's death. Son states that in their religion that suffering is necessary at times to cleanse soul so that patient may enter heaven and be with God.  P: Continue to update family and provide GOC   Best Practice (right click and "Reselect all SmartList Selections" daily)  Diet/type: NPO,  DVT prophylaxis: sq heparin  GI prophylaxis: H2B Lines: RUE PICC 5/29 Foley:  Yes, and it is  still needed Code Status:  Full  Last date of multidisciplinary goals of care discussion: 6/4 long discussion with family - full code  Updated son 6/9 of worsening renal failure  Updated daughter in law on 6/10   Critical care time: 35 mins    CRITICAL CARE Rey Catholic AGACNP-BC   [facility] 12/05/2023, 11:10 AM  Please see [email or web address] for pager details.  From 7A-7P if no response, please call [phone]. After hours, please call ELink [phone].

## 2023-12-06 LAB — GLUCOSE, CAPILLARY
Glucose-Capillary: 171 mg/dL — ABNORMAL HIGH (ref 70–99)
Glucose-Capillary: 108 mg/dL — ABNORMAL HIGH (ref 70–99)
Glucose-Capillary: >600 mg/dL (ref 70–99)
Glucose-Capillary: 78 mg/dL (ref 70–99)
Glucose-Capillary: 77 mg/dL (ref 70–99)
Glucose-Capillary: 73 mg/dL (ref 70–99)
Glucose-Capillary: 161 mg/dL — ABNORMAL HIGH (ref 70–99)
Glucose-Capillary: 60 mg/dL — ABNORMAL LOW (ref 70–99)

## 2023-12-06 LAB — BASIC METABOLIC PANEL WITH GFR
Anion gap: 15 (ref 5–15)
BUN: 145 mg/dL — ABNORMAL HIGH (ref 8–23)
CO2: 16 mmol/L — ABNORMAL LOW (ref 22–32)
Calcium: 7.5 mg/dL — ABNORMAL LOW (ref 8.9–10.3)
Chloride: 93 mmol/L — ABNORMAL LOW (ref 98–111)
Creatinine, Ser: 4.24 mg/dL — ABNORMAL HIGH (ref 0.61–1.24)
GFR, Estimated: 13 mL/min — ABNORMAL LOW (ref >60)
Glucose, Bld: 167 mg/dL — ABNORMAL HIGH (ref 70–99)
Potassium: 5.7 mmol/L — ABNORMAL HIGH (ref 3.5–5.1)
Sodium: 124 mmol/L — ABNORMAL LOW (ref 135–145)

## 2023-12-06 LAB — CBC WITH DIFFERENTIAL/PLATELET
Abs Immature Granulocytes: 0.31 10*3/uL — ABNORMAL HIGH (ref 0.00–0.07)
Basophils Absolute: 0.1 10*3/uL (ref 0.0–0.1)
Basophils Relative: 0 %
Eosinophils Absolute: 0 10*3/uL (ref 0.0–0.5)
Eosinophils Relative: 0 %
HCT: 26.2 % — ABNORMAL LOW (ref 39.0–52.0)
Hemoglobin: 8.4 g/dL — ABNORMAL LOW (ref 13.0–17.0)
Immature Granulocytes: 1 %
Lymphocytes Relative: 3 %
Lymphs Abs: 0.7 10*3/uL (ref 0.7–4.0)
MCH: 27.5 pg (ref 26.0–34.0)
MCHC: 32.1 g/dL (ref 30.0–36.0)
MCV: 85.6 fL (ref 80.0–100.0)
Monocytes Absolute: 0.5 10*3/uL (ref 0.1–1.0)
Monocytes Relative: 2 %
Neutro Abs: 23 10*3/uL — ABNORMAL HIGH (ref 1.7–7.7)
Neutrophils Relative %: 94 %
Platelets: 180 10*3/uL (ref 150–400)
RBC: 3.06 MIL/uL — ABNORMAL LOW (ref 4.22–5.81)
RDW: 17.4 % — ABNORMAL HIGH (ref 11.5–15.5)
WBC: 24.5 10*3/uL — ABNORMAL HIGH (ref 4.0–10.5)
nRBC: 0.1 % (ref 0.0–0.2)

## 2023-12-06 MED ORDER — SODIUM ZIRCONIUM CYCLOSILICATE 10 G PO PACK
10.0000 g | PACK | Freq: Once | ORAL | Status: AC
  Administered 2023-12-06: 10 g
  Filled 2023-12-06: qty 1

## 2023-12-06 MED ORDER — FAMOTIDINE 20 MG PO TABS
10.0000 mg | ORAL_TABLET | Freq: Every day | ORAL | Status: DC
  Administered 2023-12-06 – 2023-12-10 (×4): 10 mg
  Filled 2023-12-06 (×4): qty 1

## 2023-12-06 MED ORDER — POLYVINYL ALCOHOL 1.4 % OP SOLN
1.0000 [drp] | OPHTHALMIC | Status: DC | PRN
  Administered 2023-12-07 – 2023-12-09 (×3): 1 [drp] via OPHTHALMIC
  Filled 2023-12-06: qty 15

## 2023-12-06 NOTE — Progress Notes (Signed)
 NAME:  [name] [name], MRN:  [id number], DOB:  08-11-1933, LOS: 16 ADMISSION DATE:  11/20/2023, CONSULTATION DATE:  6/2 REFERRING MD:  Dr. Maury Space, CHIEF COMPLAINT:  aspiration pna; sepsis   History of Present Illness:  Patient is a 89 yo M w/ pertinent PMH dementia, dysphagia s/p PEG tube, HTN, HLD, Hypothyroidism presents to [facility] on 5/26 w/ sepsis.  Patient bedbound at baseline. Patient having fever and chills. Also w/ increased urine frequency. Came to [name] on 5/26 for further eval. Noted to be tachycardic and tachypneic. WBC 16 and PCT 2.6. LA 7.4. Cultures obtained, started on abx, given iv fluids. UA negative for UTI. CXR concerning for aspiration pna. Covid/flu/rsv negative. Admitted by TRH. Palliative care consulted.  On 6/2 patient with frequent secretions requiring nt suctioning. Minimal o2 requirements. ABG pending. PCCM consulted.  Pertinent  Medical History   Past Medical History:  Diagnosis Date   Back pain 10/2008   per MRI 11/06/2008 - severe spinal stenosis worse at L4-5, L3-4, with neural compression at those levels   BPH (benign prostatic hyperplasia)    Eczema    Gout    Hypertension    Hypothyroidism     Significant Hospital Events: Including procedures, antibiotic start and stop dates in addition to other pertinent events   5/26: admitted for sepsis for asp pna 6/2: less responsive; pccm consulted 6/3: pulmonary reconsulted Dec 26, 2023: coded on progressive; moved to 58M with PCCM. Intubated. On levo/vaso. High peaks, got lavaged. Family is requesting full code.  6/5 Resp arrest leading to cardiac arrest 12/26/2023. Anemic this morning hgb 6/5 and receiving 1U PRBC.  6/6 remains on pressors with poor neurological status  6/7 patient remains on ventilator with poor neurological function, will open eyes to verbal stimuli but unable to follow any commands.  ENT evaluated and patient is candidate for tracheostomy with tentative plan scheduled for 6/11 6/8 renal function continues to worsen  with severe hyperkalemia on a.m. labs and severely elevated BUN of 136 6/9 patient having worsening renal failure with hyperkalemia-temporizing measures given to assist in reversal of elevated K, patient on high ventilatory support 6/10 Patient in Multisystem organ failure, family update and aware of critical care status   Interim History / Subjective:  Remains comatose.  Objective    Blood pressure 133/61, pulse (!) 110, temperature (!) 97.1 F (36.2 C), temperature source Axillary, resp. rate 20, height 6' (1.829 m), weight 48.6 kg, SpO2 100%.    Vent Mode: PRVC FiO2 (%):  [40 %] 40 % Set Rate:  [22 bmp] 22 bmp Vt Set:  [401 mL] 620 mL PEEP:  [5 cmH20] 5 cmH20 Plateau Pressure:  [25 cmH20-35 cmH20] 35 cmH20   Intake/Output Summary (Last 24 hours) at 12/06/2023 0905 Last data filed at 12/06/2023 0800 Gross per 24 hour  Intake 1705.02 ml  Output 606 ml  Net 1099.02 ml   Filed Weights   12/03/23 0500 12/04/23 0345 12/05/23 0500  Weight: 48 kg 54.8 kg 48.6 kg    Examination: Upward gaze Triggers Vent Diffuse anasarca and muscle wasting PEG in place, abd firm No response to pain for me  K being medically managed Made some urine BUN/Cr still rising  Resolved problem list  Shock   Assessment and Plan  In hospital cardiac arrest 2/2 respiratory arrest  Acute respiratory failure w/ hypoxia Aspiration pneumonia vs multifocal pneumonia  Anuric acute kidney injury with signs of progression to renal failure Hyperkalemia Severe uremia Dementia  Failure to thrive  Dysphagic s/p PEG  tube placement  Severe protein calorie malnutrition Hyperlipidemia Hypoglycemia  Acute on chronic blood loss 2/2 anemia of chronic disease   Patient's status is critical and unlikely to live through hospitalization regardless of treatment avenue approached.  Family having trouble discussing transitioning care because they believe it is against their religion.  Not HD candidate.  Not trach  candidate.  Continue vent support with fent PRN, medical management of hyperkalemia  In hospital death expected, patient remains full code.  Family does not wish to discuss GOC further.  32 min cc time  Ardelle Kos MD PCCM  Please see [email or web address] for pager details.  From 7A-7P if no response, please call [phone]. After hours, please call ELink [phone].

## 2023-12-06 NOTE — Progress Notes (Signed)
 eLink Physician-Brief Progress Note Patient Name: [name] [name] DOB: 16-Jan-1934 MRN: [id number]   Date of Service  12/06/2023  HPI/Events of Note  K+ 5.7  eICU Interventions  Lokelma  10 gm per OG tube x 1 ordered.        Novali Vollman U Roza Creamer 12/06/2023, 5:32 AM

## 2023-12-06 NOTE — Plan of Care (Signed)
  Problem: Fluid Volume: Goal: Hemodynamic stability will improve Outcome: Progressing   Problem: Clinical Measurements: Goal: Diagnostic test results will improve Outcome: Progressing Goal: Signs and symptoms of infection will decrease Outcome: Progressing   Problem: Respiratory: Goal: Ability to maintain adequate ventilation will improve Outcome: Progressing   Problem: Education: Goal: Knowledge of General Education information will improve Description: Including pain rating scale, medication(s)/side effects and non-pharmacologic comfort measures Outcome: Progressing   Problem: Clinical Measurements: Goal: Ability to maintain clinical measurements within normal limits will improve Outcome: Progressing Goal: Will remain free from infection Outcome: Progressing Goal: Diagnostic test results will improve Outcome: Progressing Goal: Respiratory complications will improve Outcome: Progressing Goal: Cardiovascular complication will be avoided Outcome: Progressing   Problem: Activity: Goal: Risk for activity intolerance will decrease Outcome: Progressing   Problem: Nutrition: Goal: Adequate nutrition will be maintained Outcome: Progressing   Problem: Coping: Goal: Level of anxiety will decrease Outcome: Progressing   Problem: Elimination: Goal: Will not experience complications related to bowel motility Outcome: Progressing Goal: Will not experience complications related to urinary retention Outcome: Progressing   Problem: Pain Managment: Goal: General experience of comfort will improve and/or be controlled Outcome: Progressing   Problem: Safety: Goal: Ability to remain free from injury will improve Outcome: Progressing   Problem: Skin Integrity: Goal: Risk for impaired skin integrity will decrease Outcome: Progressing   Problem: Activity: Goal: Ability to tolerate increased activity will improve Outcome: Progressing   Problem: Respiratory: Goal: Ability to  maintain a clear airway and adequate ventilation will improve Outcome: Progressing   Problem: Role Relationship: Goal: Method of communication will improve Outcome: Progressing

## 2023-12-06 NOTE — Plan of Care (Signed)
     Brief palliative note:   Chart reviewed. Discussed with Dr. Felipe Horton. Goals are clear for continued full scope interventions. Although patient's prognosis is poor, family is clear they are accepting of all offered and appropriate medical interventions to prolong life.  PMT will sign-off at this time. Please place a new consult if PMT can be of further assistance.     Maisie Scotland, NP-C Palliative Medicine   Please call Palliative Medicine team phone with any questions [phone]. For individual providers please see AMION.   No charge

## 2023-12-07 DIAGNOSIS — E871 Hypo-osmolality and hyponatremia: Secondary | ICD-10-CM

## 2023-12-07 DIAGNOSIS — D72829 Elevated white blood cell count, unspecified: Secondary | ICD-10-CM

## 2023-12-07 LAB — GLUCOSE, CAPILLARY
Glucose-Capillary: 36 mg/dL — CL (ref 70–99)
Glucose-Capillary: 43 mg/dL — CL (ref 70–99)
Glucose-Capillary: 55 mg/dL — ABNORMAL LOW (ref 70–99)
Glucose-Capillary: 67 mg/dL — ABNORMAL LOW (ref 70–99)
Glucose-Capillary: 75 mg/dL (ref 70–99)
Glucose-Capillary: 79 mg/dL (ref 70–99)
Glucose-Capillary: 80 mg/dL (ref 70–99)
Glucose-Capillary: 81 mg/dL (ref 70–99)
Glucose-Capillary: 81 mg/dL (ref 70–99)

## 2023-12-07 MED ORDER — DEXTROSE 50 % IV SOLN
INTRAVENOUS | Status: AC
  Administered 2023-12-07: 50 mL
  Filled 2023-12-07: qty 50

## 2023-12-07 NOTE — Progress Notes (Signed)
 NAME:  [name] [name], MRN:  [id number], DOB:  Nov 01, 1933, LOS: 17 ADMISSION DATE:  11/20/2023, CONSULTATION DATE:  6/2 REFERRING MD:  Dr. Maury Space, CHIEF COMPLAINT:  aspiration pna; sepsis   History of Present Illness:  Patient is a 89 yo [name] [name] w/ pertinent PMH dementia, dysphagia s/p PEG tube, HTN, HLD, Hypothyroidism presents to [facility] on 5/26 w/ sepsis.  Patient bedbound at baseline. Patient having fever and chills. Also w/ increased urine frequency. Came to [facility] on 5/26 for further eval. Noted to be tachycardic and tachypneic. WBC 16 and PCT 2.6. LA 7.4. Cultures obtained, started on abx, given iv fluids. UA negative for UTI. CXR concerning for aspiration pna. Covid/flu/rsv negative. Admitted by TRH. Palliative care consulted.  On 6/2 patient with frequent secretions requiring nt suctioning. Minimal o2 requirements. ABG pending. PCCM consulted.  Pertinent  Medical History   Past Medical History:  Diagnosis Date   Back pain 10/2008   per MRI 11/06/2008 - severe spinal stenosis worse at L4-5, L3-4, with neural compression at those levels   BPH (benign prostatic hyperplasia)    Eczema    Gout    Hypertension    Hypothyroidism     Significant Hospital Events: Including procedures, antibiotic start and stop dates in addition to other pertinent events   5/26: admitted for sepsis for asp pna 6/2: less responsive; pccm consulted 6/3: pulmonary reconsulted 12/09/23: coded on progressive; moved to 84M with PCCM. Intubated. On levo/vaso. High peaks, got lavaged. Family is requesting full code.  6/5 Resp arrest leading to cardiac arrest 12-09-2023. Anemic this morning hgb 6/5 and receiving 1U PRBC.  6/6 remains on pressors with poor neurological status  6/7 patient remains on ventilator with poor neurological function, will open eyes to verbal stimuli but unable to follow any commands.  ENT evaluated and patient is candidate for tracheostomy with tentative plan scheduled for 6/11 6/8 renal function continues to worsen  with severe hyperkalemia on a.[name] [name]. labs and severely elevated BUN of 136 6/9 patient having worsening renal failure with hyperkalemia-temporizing measures given to assist in reversal of elevated K, patient on high ventilatory support 6/10 Patient in Multisystem organ failure, family update and aware of critical care status  6/12 incr FiO2, incr pressors, incr d10 req   Interim History / Subjective:  Decompensating actively this morning   Objective    Blood pressure (!) 88/45, pulse 97, temperature 99.5 F (37.5 C), resp. rate 19, height 6' (1.829 [name] [name]), weight 46.1 kg, SpO2 95%.    Vent Mode: PRVC FiO2 (%):  [40 %-60 %] 60 % Set Rate:  [22 bmp] 22 bmp Vt Set:  [620 mL] 620 mL PEEP:  [5 cmH20] 5 cmH20 Plateau Pressure:  [33 cmH20-36 cmH20] 35 cmH20   Intake/Output Summary (Last 24 hours) at 12/07/2023 0823 Last data filed at 12/07/2023 0700 Gross per 24 hour  Intake 1452.66 ml  Output 625 ml  Net 827.66 ml   Filed Weights   12/04/23 0345 12/05/23 0500 12/07/23 0503  Weight: 54.8 kg 48.6 kg 46.1 kg    Examination:  Gen: critically and chronically ill elderly cachectic [name] [name] NAD  Neuro: Does not awaken or follow commands  HEENT: ETT secure. Temporal muscle wasting  CV: tachycardic cap refill is sluggish  Pulm: mechanically ventilated  GI: thin. + PEG  WU:[id number]  MSK: edematous    Resolved problem list  Shock   Assessment and Plan   In hospital cardiac arrest Shock  Acute encephalopathy superimposed on baseline severe dementia  Acute  resp failure w hypoxia Aspiration PNA, multifocal PNA AKI with anuria, progression to renal failure with uremia  Hyperkalemia Hyponatremia Hypocalcemia  Hypoglycemia FTT in adult Severe protein calorie malnutrition Dysphagia s/p PEG Acute on chronic anemia  Leukocytosis  DNR discussion Goals of care discussion  P -his exam and clinical picture are exceedingly poor and it is likely that he dies in the ICU this admission -we have incr  pressors and FiO2 in response to his decompensation -incr d10 to 75/hr  -ACLS will not provide benefit for clinical outcomes, but is full code  -not HD candidate -not trach candidate -I have d/w his son who is coming back to the hospital     CRITICAL CARE Performed by: Delories Fetter   Total critical care time: 37 minutes  Critical care time was exclusive of separately billable procedures and treating other patients. Critical care was necessary to treat or prevent imminent or life-threatening deterioration.  Critical care was time spent personally by me on the following activities: development of treatment plan with patient and/or surrogate as well as nursing, discussions with consultants, evaluation of patient's response to treatment, examination of patient, obtaining history from patient or surrogate, ordering and performing treatments and interventions, ordering and review of laboratory studies, ordering and review of radiographic studies, pulse oximetry and re-evaluation of patient's condition.  Eston Hence MSN, AGACNP-BC [place] [facility] Amion for pager  12/07/2023, 8:23 AM

## 2023-12-07 NOTE — IPAL (Signed)
  Interdisciplinary Goals of Care Family Meeting   Date carried out: 12/07/2023  Location of the meeting: Bedside  Member's involved: Nurse Practitioner and Family Member or next of kin  Durable Power of Attorney or acting medical decision maker: Son    Discussion: We discussed goals of care for [employer] .  Met w son and son's wife. Discussed his decline this morning. We are nearing max dose NE. On high dose d10 without sustained benefit.  We all acknowledge he is not going to survive this hospitalization, I am concerned that he may die today based on this abrupt further decline.  We talked about the religious implications of end of life care, comfort focussed care. This is not a viable consideration and current measures are to be continued. We discussed how we respond when his heart stops despite these measures. We agree that in the event of arrest, we should allow a peaceful passing without resuscitation. I very clearly explained this is a code status called DNR.  We will cont current supports, but there is not much room for escalation. Family understands and appreciates the care he is receiving, understanding that he is nearing end of life. We talked about the option of adding comfort focussed medications while on MV, pressors but are not pursuing at this time.   All questions answered   Code status:   Code Status: Do not attempt resuscitation (DNR) PRE-ARREST INTERVENTIONS DESIRED   Disposition: Continue current acute care  Time spent for the meeting:    Delories Fetter, NP  12/07/2023, 9:51 AM

## 2023-12-07 NOTE — Progress Notes (Signed)
 Per Felipe Horton MD, do not escalate care overnight. Maintain D10 and pressor requirements as is. Roslyn Coombe MD made aware.

## 2023-12-07 NOTE — Plan of Care (Signed)
  Problem: Respiratory: Goal: Ability to maintain adequate ventilation will improve Outcome: Progressing   Problem: Clinical Measurements: Goal: Ability to maintain clinical measurements within normal limits will improve Outcome: Progressing Goal: Will remain free from infection Outcome: Progressing Goal: Cardiovascular complication will be avoided Outcome: Progressing

## 2023-12-08 LAB — BASIC METABOLIC PANEL WITH GFR
Anion gap: 14 (ref 5–15)
BUN: 144 mg/dL — ABNORMAL HIGH (ref 8–23)
CO2: 15 mmol/L — ABNORMAL LOW (ref 22–32)
Calcium: 6.8 mg/dL — ABNORMAL LOW (ref 8.9–10.3)
Chloride: 83 mmol/L — ABNORMAL LOW (ref 98–111)
Creatinine, Ser: 4.48 mg/dL — ABNORMAL HIGH (ref 0.61–1.24)
GFR, Estimated: 12 mL/min — ABNORMAL LOW (ref >60)
Glucose, Bld: 287 mg/dL — ABNORMAL HIGH (ref 70–99)
Potassium: 5.7 mmol/L — ABNORMAL HIGH (ref 3.5–5.1)
Sodium: 112 mmol/L — CL (ref 135–145)

## 2023-12-08 LAB — GLUCOSE, CAPILLARY
Glucose-Capillary: 87 mg/dL (ref 70–99)
Glucose-Capillary: 103 mg/dL — ABNORMAL HIGH (ref 70–99)
Glucose-Capillary: 33 mg/dL — CL (ref 70–99)
Glucose-Capillary: 36 mg/dL — CL (ref 70–99)
Glucose-Capillary: <10 mg/dL — CL (ref 70–99)

## 2023-12-08 MED ORDER — DEXTROSE 10 % IV SOLN: INTRAVENOUS | Status: DC

## 2023-12-08 MED ORDER — DEXTROSE 50 % IV SOLN: 50.0000 mL | Freq: Once | INTRAVENOUS | Status: DC

## 2023-12-08 NOTE — Progress Notes (Signed)
 eLink Physician-Brief Progress Note Patient Name: [name] [name] DOB: 1933/11/12 MRN: [id number]   Date of Service  12/08/2023  HPI/Events of Note  Family demands that D 10 gtt and CBG monitoring be re-instated.  eICU Interventions  Orders entered.        Marlynn Singer 12/08/2023, 8:42 PM

## 2023-12-08 NOTE — Progress Notes (Signed)
 Nutrition Brief Note  Patient discussed in interdisciplinary rounds.  Patient is now DNR. No plans for escalation of care.  No plans for enteral nutrition at this time.  RD to sign off. Please re-consult if additional nutrition related interventions desired.   Allie Kaylen Nghiem, RDN, LDN Clinical Nutrition See AMiON for contact information.

## 2023-12-08 NOTE — Progress Notes (Signed)
 eLink Physician-Brief Progress Note Patient Name: [name] [name] DOB: 28-Feb-1934 MRN: [id number]   Date of Service  12/08/2023  HPI/Events of Note  Per bedside RN discussion with family they no longer want D 10 gtt and CBG monitoring.  eICU Interventions  Orders discontinued.        Marlynn Singer 12/08/2023, 9:54 PM

## 2023-12-08 NOTE — Progress Notes (Signed)
 Chaplain stopped by to inquire if family was in need of any spiritual or emotional support. Family politely declined chaplain services at this time. Chaplains remain available.

## 2023-12-08 NOTE — Progress Notes (Signed)
 NAME:  [name] [name], MRN:  [id number], DOB:  [birth year], LOS: 18 ADMISSION DATE:  11/20/2023, CONSULTATION DATE:  6/2 REFERRING MD:  Dr. Maury Space, CHIEF COMPLAINT:  aspiration pna; sepsis   History of Present Illness:  Patient is a 89 yo M w/ pertinent PMH dementia, dysphagia s/p PEG tube, HTN, HLD, Hypothyroidism presents to [facility] on 5/26 w/ sepsis.  Patient bedbound at baseline. Patient having fever and chills. Also w/ increased urine frequency. Came to [facility] on 5/26 for further eval. Noted to be tachycardic and tachypneic. WBC 16 and PCT 2.6. LA 7.4. Cultures obtained, started on abx, given iv fluids. UA negative for UTI. CXR concerning for aspiration pna. Covid/flu/rsv negative. Admitted by TRH. Palliative care consulted.  On 6/2 patient with frequent secretions requiring nt suctioning. Minimal o2 requirements. ABG pending. PCCM consulted.  Pertinent  Medical History   Past Medical History:  Diagnosis Date   Back pain 10/2008   per MRI 11/06/2008 - severe spinal stenosis worse at L4-5, L3-4, with neural compression at those levels   BPH (benign prostatic hyperplasia)    Eczema    Gout    Hypertension    Hypothyroidism     Significant Hospital Events: Including procedures, antibiotic start and stop dates in addition to other pertinent events   5/26: admitted for sepsis for asp pna 6/2: less responsive; pccm consulted 6/3: pulmonary reconsulted 12-24-23: coded on progressive; moved to 94M with PCCM. Intubated. On levo/vaso. High peaks, got lavaged. Family is requesting full code.  6/5 Resp arrest leading to cardiac arrest December 24, 2023. Anemic this morning hgb 6/5 and receiving 1U PRBC.  6/6 remains on pressors with poor neurological status  6/7 patient remains on ventilator with poor neurological function, will open eyes to verbal stimuli but unable to follow any commands.  ENT evaluated and patient is candidate for tracheostomy with tentative plan scheduled for 6/11 6/8 renal function continues to worsen  with severe hyperkalemia on a.m. labs and severely elevated BUN of 136 6/9 patient having worsening renal failure with hyperkalemia-temporizing measures given to assist in reversal of elevated K, patient on high ventilatory support 6/10 Patient in Multisystem organ failure, family update and aware of critical care status  6/12 incr FiO2, incr pressors, incr d10 req  6/13 dc d10, increasing sedation for high peaks   Interim History / Subjective:  Family at bedside  Na 112 Cr 4.5 BUN 144 CO2 15    Objective    Blood pressure (!) 125/54, pulse 87, temperature (!) 96.1 F (35.6 C), resp. rate (!) 24, height 6' (1.829 m), weight 46.1 kg, SpO2 100%.    Vent Mode: PRVC FiO2 (%):  [40 %-50 %] 40 % Set Rate:  [22 bmp] 22 bmp Vt Set:  [620 mL] 620 mL PEEP:  [5 cmH20] 5 cmH20 Plateau Pressure:  [36 cmH20-43 cmH20] 37 cmH20   Intake/Output Summary (Last 24 hours) at 12/08/2023 1113 Last data filed at 12/08/2023 0700 Gross per 24 hour  Intake 2820.9 ml  Output 25 ml  Net 2795.9 ml   Filed Weights   12/04/23 0345 12/05/23 0500 12/07/23 0503  Weight: 54.8 kg 48.6 kg 46.1 kg    Examination:  Gen: critically and chronically ill elderly M  Neuro:He does not follow commands. Initiating respirations  HEENT:  ETT secure. Anicteric sclera. Temporal muscle wasting  CV: rr s1s2  Pulm: Mechanically ventilated. High peak pressures.  GI: Thin soft +FMS  WU:JWJXB  MSK: edematous    Resolved problem list  Shock  Assessment and Plan      In hospital cardiac arrest Shock Acute encephalopathy superimposed on baseline severe dementia Acute respiratory failure with hypoxia Aspiration PNA; multifocal PNA AKI w anuria, uremia Hyperkalemia Hyponatremia, worse NAGMA Hypoglycemia FTT in adult Severe protein calorie malnutrition Dysphagia s/p PEG AoC anemia Leukocytosis DNR GOC discussion  P -family understands that his current course is terminal and he is expected to die in the ICU  this admission, likely in the coming day(s)  -for religious/cultural reasons, a concerted transition to comfort care is not a viable consideration -- though medically this would be appropriate.  -decision reached to change code status to DNR 6/12  -There is not utility in following his renal function -- he is not an HD candidate. -he is not a trach candidate  -no escalation of pressors  -He has been on d10 100/hr which has had complications regarding his electrolyte balance, and contributing to volume overload in context of his renal failure which is impacting his lungs. Discussed with family and decision reached to stop the d10 gtt as it is causing more harm than benefit.  -we will cease CBG checks -- his hypoglycemia was refractory to d50s and req d10 gtt which as above a shared decision was reached to discontinue this -he has very high peaks/plateau pressures 6/13 -- I discussed this with family and we reached a decision to titrate PAD medications for vent compliance goals. We discussed specifically that these medications are also utilized for comfort focussed care & I do expect that titration of meds for vent goals will also cause him to be somewhat more comfortable. We discussed explicitly that the goal of these medications for him is not comfort care however, and discussed that adding these medications for vent reasons would not be the cause of death.     CRITICAL CARE Performed by: Delories Fetter   Total critical care time: 62 minutes  Critical care time was exclusive of separately billable procedures and treating other patients. Critical care was necessary to treat or prevent imminent or life-threatening deterioration.  Critical care was time spent personally by me on the following activities: development of treatment plan with patient and/or surrogate as well as nursing, discussions with consultants, evaluation of patient's response to treatment, examination of patient, obtaining  history from patient or surrogate, ordering and performing treatments and interventions, ordering and review of laboratory studies, ordering and review of radiographic studies, pulse oximetry and re-evaluation of patient's condition.  Eston Hence MSN, AGACNP-BC [place] [facility]/Critical Care Medicine Amion for pager  12/08/2023, 11:13 AM

## 2023-12-08 NOTE — Progress Notes (Signed)
 eLink Physician-Brief Progress Note Patient Name: [name] [name] DOB: August 24, 1933 MRN: [id number]   Date of Service  12/08/2023  HPI/Events of Note  Serum sodium result reviewed.  eICU Interventions  Repeat BMP ordered.        Eloyce Bultman U Jens Siems 12/08/2023, 6:19 AM

## 2023-12-08 NOTE — TOC Progression Note (Signed)
 Transition of Care [facility]) - Progression Note    Patient Details  Name: [name] [name] MRN: [id number] Date of Birth: [birth year]  Transition of Care [facility]) CM/SW Contact  Tom-Johnson, Deagen Krass Daphne, RN Phone Number: 12/08/2023, 12:26 PM  Clinical Narrative:     Patient continues to be intubated, family at bedside. Due to cultural beliefs, patient cannot be transitioned to Comfort care. Family aware of patient's prognosis.  CM will continue to follow and render compassionate support at this time.          Expected Discharge Plan: Home/Self Care Barriers to Discharge: Continued Medical Work up  Expected Discharge Plan and Services       Living arrangements for the past 2 months: Single Family Home                                       Social Determinants of Health (SDOH) Interventions SDOH Screenings   Food Insecurity: Patient Unable To Answer (11/21/2023)  Housing: Patient Unable To Answer (11/21/2023)  Transportation Needs: Patient Unable To Answer (11/21/2023)  Utilities: Patient Unable To Answer (11/21/2023)  Depression (PHQ2-9): Medium Risk (03/10/2022)  Social Connections: Patient Unable To Answer (11/21/2023)  Tobacco Use: Low Risk  (11/22/2023)    Readmission Risk Interventions    10/09/2023    2:45 PM  Readmission Risk Prevention Plan  Transportation Screening Complete  PCP or Specialist Appt within 3-5 Days Complete  HRI or Home Care Consult Complete  Social Work Consult for Recovery Care Planning/Counseling Complete  Palliative Care Screening Not Applicable  Medication Review [employer]) Complete

## 2023-12-08 NOTE — Progress Notes (Signed)
 RN made aware of a CBG <10 and family requesting to restart d10 gtt and q4 cbg monitoring. RN rechecked sugar off of central line and got critical of 33 and 36. Elink and MD made aware of critical values and family's requests. MD restarted orders. Family was reminded of the conversations with treatment team earlier this morning as to why d10 was originally stopped. Family decided they did not want to start d10 and did not want q4 CBG monitoring. MD made aware. Orders d/cd.

## 2023-12-08 NOTE — Plan of Care (Signed)
 ENT Plan of care: Reviewed chart. Given clinical condition and medicine team notes, will hold off on tracheostomy. Please re-engage ENT if necessary [name] [name] B Malaney Mcbean

## 2023-12-09 NOTE — Progress Notes (Signed)
 NAME:  [name] [name], MRN:  [id number], DOB:  11-Aug-1933, LOS: 19 ADMISSION DATE:  11/20/2023, CONSULTATION DATE:  6/2 REFERRING MD:  Dr. Maury Space, CHIEF COMPLAINT:  aspiration pna; sepsis   History of Present Illness:  Patient is a 89 yo M w/ pertinent PMH dementia, dysphagia s/p PEG tube, HTN, HLD, Hypothyroidism presents to [name] on 5/26 w/ sepsis.  Patient bedbound at baseline. Patient having fever and chills. Also w/ increased urine frequency. Came to [facility] on 5/26 for further eval. Noted to be tachycardic and tachypneic. WBC 16 and PCT 2.6. LA 7.4. Cultures obtained, started on abx, given iv fluids. UA negative for UTI. CXR concerning for aspiration pna. Covid/flu/rsv negative. Admitted by TRH. Palliative care consulted.  On 6/2 patient with frequent secretions requiring nt suctioning. Minimal o2 requirements. ABG pending. PCCM consulted.  Pertinent  Medical History   Past Medical History:  Diagnosis Date   Back pain 10/2008   per MRI 11/06/2008 - severe spinal stenosis worse at L4-5, L3-4, with neural compression at those levels   BPH (benign prostatic hyperplasia)    Eczema    Gout    Hypertension    Hypothyroidism     Significant Hospital Events: Including procedures, antibiotic start and stop dates in addition to other pertinent events   5/26: admitted for sepsis for asp pna 6/2: less responsive; pccm consulted 6/3: pulmonary reconsulted December 29, 2023: coded on progressive; moved to 15M with PCCM. Intubated. On levo/vaso. High peaks, got lavaged. Family is requesting full code.  6/5 Resp arrest leading to cardiac arrest December 29, 2023. Anemic this morning hgb 6/5 and receiving 1U PRBC.  6/6 remains on pressors with poor neurological status  6/7 patient remains on ventilator with poor neurological function, will open eyes to verbal stimuli but unable to follow any commands.  ENT evaluated and patient is candidate for tracheostomy with tentative plan scheduled for 6/11 6/8 renal function continues to worsen  with severe hyperkalemia on a.m. labs and severely elevated BUN of 136 6/9 patient having worsening renal failure with hyperkalemia-temporizing measures given to assist in reversal of elevated K, patient on high ventilatory support 6/10 Patient in Multisystem organ failure, family update and aware of critical care status  6/12 incr FiO2, incr pressors, incr d10 req  6/13 dc d10, increasing sedation for high peaks   Interim History / Subjective:  CBGs inadvertently checked overnight prompting resumption of d10 and subsequent cessation   Ongoing high peak pressures.   Objective    Blood pressure (!) 90/46, pulse 83, temperature 98.1 F (36.7 C), resp. rate (!) 22, height 6' (1.829 m), weight 46.1 kg, SpO2 95%.    Vent Mode: PRVC FiO2 (%):  [40 %] 40 % Set Rate:  [22 bmp] 22 bmp Vt Set:  [620 mL] 620 mL PEEP:  [5 cmH20] 5 cmH20 Plateau Pressure:  [29 cmH20-38 cmH20] 35 cmH20   Intake/Output Summary (Last 24 hours) at 12/09/2023 1129 Last data filed at 12/09/2023 0800 Gross per 24 hour  Intake 885.18 ml  Output 130 ml  Net 755.18 ml   Filed Weights   12/04/23 0345 12/05/23 0500 12/07/23 0503  Weight: 54.8 kg 48.6 kg 46.1 kg    Examination:  Gen: Critically and chronically ill elderly M intubated lightly sedated  Neuro: He is not responsive. Does initiate respirations  HEENT:  ETT secure facial muscle wasting dry mm  CV: rrr  Pulm: High peak pressures, rales bilaterally  GI: thin  GU: foley   MSK: pitting edema greatest at distal  extremities    Resolved problem list  Shock   Assessment and Plan   In hospital cardiac arrest Shock Acute encephalopathy superimposed on baseline severe dementia Acute respiratory failure with hypoxia Aspiration PNA; multifocal PNA AKI w anuria, uremia Hyperkalemia Hyponatremia, worse NAGMA Hypoglycemia FTT in adult Severe protein calorie malnutrition Dysphagia s/p PEG AoC anemia Leukocytosis DNR GOC discussion  P -family  understands that his current course is terminal and he is expected to die in the ICU this admission, likely in the coming day(s)  -for religious/cultural reasons, a concerted transition to comfort care is not a viable consideration -- though medically this would be appropriate.  -decision reached to change code status to DNR 6/12  -see note 6/13 re discussion and decision to stop d10 and labs  -he is not a trach candidate or HD candidate  -no pressor escalation -no escalation of pressors  -see note 6/13 for discussion and decision re titrating PAD in response to his high peak pressures. His peaks/plats are still high 6/14 and international I have d/w nurse need for bolus and further titration.     CRITICAL CARE Performed by: Delories Fetter   Total critical care time: 36 minutes  Critical care time was exclusive of separately billable procedures and treating other patients. Critical care was necessary to treat or prevent imminent or life-threatening deterioration.  Critical care was time spent personally by me on the following activities: development of treatment plan with patient and/or surrogate as well as nursing, discussions with consultants, evaluation of patient's response to treatment, examination of patient, obtaining history from patient or surrogate, ordering and performing treatments and interventions, ordering and review of laboratory studies, ordering and review of radiographic studies, pulse oximetry and re-evaluation of patient's condition.  Eston Hence MSN, AGACNP-BC [place] Pulmonary/Critical Care Medicine Amion for pager  12/09/2023, 11:29 AM

## 2023-12-10 NOTE — Progress Notes (Signed)
 NAME:  Kalmen Lollar, MRN:  [id number], DOB:  01-24-1934, LOS: 20 ADMISSION DATE:  11/20/2023, CONSULTATION DATE:  6/2 REFERRING MD:  Dr. Maury Space, CHIEF COMPLAINT:  aspiration pna; sepsis   History of Present Illness:  Patient is a 89 yo M w/ pertinent PMH dementia, dysphagia s/p PEG tube, HTN, HLD, Hypothyroidism presents to [facility] on 5/26 w/ sepsis.  Patient bedbound at baseline. Patient having fever and chills. Also w/ increased urine frequency. Came to [facility] on 5/26 for further eval. Noted to be tachycardic and tachypneic. WBC 16 and PCT 2.6. LA 7.4. Cultures obtained, started on abx, given iv fluids. UA negative for UTI. CXR concerning for aspiration pna. Covid/flu/rsv negative. Admitted by TRH. Palliative care consulted.  On 6/2 patient with frequent secretions requiring nt suctioning. Minimal o2 requirements. ABG pending. PCCM consulted.  Pertinent  Medical History   Past Medical History:  Diagnosis Date   Back pain 10/2008   per MRI 11/06/2008 - severe spinal stenosis worse at L4-5, L3-4, with neural compression at those levels   BPH (benign prostatic hyperplasia)    Eczema    Gout    Hypertension    Hypothyroidism     Significant Hospital Events: Including procedures, antibiotic start and stop dates in addition to other pertinent events   5/26: admitted for sepsis for asp pna 6/2: less responsive; pccm consulted 6/3: pulmonary reconsulted 12-08-23: coded on progressive; moved to 18M with PCCM. Intubated. On levo/vaso. High peaks, got lavaged. Family is requesting full code.  6/5 Resp arrest leading to cardiac arrest 12/08/2023. Anemic this morning hgb 6/5 and receiving 1U PRBC.  6/6 remains on pressors with poor neurological status  6/7 patient remains on ventilator with poor neurological function, will open eyes to verbal stimuli but unable to follow any commands.  ENT evaluated and patient is candidate for tracheostomy with tentative plan scheduled for 6/11 6/8 renal function continues to worsen  with severe hyperkalemia on a.m. labs and severely elevated BUN of 136 6/9 patient having worsening renal failure with hyperkalemia-temporizing measures given to assist in reversal of elevated K, patient on high ventilatory support 6/10 Patient in Multisystem organ failure, family update and aware of critical care status  6/12 incr FiO2, incr pressors, incr d10 req  6/13 dc d10, increasing sedation for high peaks  6/14 incr fent for ongoing high peaks 6/15 worsening hypotension  Interim History / Subjective:   Worse hypotension  Ongoing high peaks and plats   Objective    Blood pressure (!) 83/15, pulse 74, temperature (!) 96.8 F (36 C), temperature source Axillary, resp. rate (!) 22, height 6' (1.829 m), weight 58.1 kg, SpO2 96%.    Vent Mode: PRVC FiO2 (%):  [40 %] 40 % Set Rate:  [18 bmp-22 bmp] 18 bmp Vt Set:  [620 mL] 620 mL PEEP:  [5 cmH20] 5 cmH20 Plateau Pressure:  [32 cmH20-40 cmH20] 38 cmH20   Intake/Output Summary (Last 24 hours) at 12/10/2023 0955 Last data filed at 12/10/2023 0610 Gross per 24 hour  Intake 940.46 ml  Output 60 ml  Net 880.46 ml   Filed Weights   12/05/23 0500 12/07/23 0503 12/09/23 2100  Weight: 48.6 kg 46.1 kg 58.1 kg    Examination:  Gen: Critically and chronically ill elderly M  Neuro: Unresponsive, does not follow commands  HEENT:  ETT NCAT facial muscle wasting  CV: rrr  Pulm: High peak pressures, rales bilaterally  GI: thin soft + PEG  GU: foley  MSK: pitting edema  Resolved problem list  Shock   Assessment and Plan    In hospital cardiac arrest Shock Acute encephalopathy, superimposed on baseline dementia Acute respiratory failure w hypoxia Aspiration PNA AKI w anuria and uremia Hyperkalemia Hyponatremia Hypoglycemia NAGMA FTT in adult Severe protein calorie malnutrition Dysphagia PEG in situ AoC anemia Leukcytosis DNR status GOC discussion -family understands that his current course is terminal and he is  expected to die in the ICU this admission, likely in the coming day(s)  -for religious/cultural reasons, a concerted transition to comfort care is not a viable consideration -- though medically this would be appropriate.  -decision reached to change code status to DNR 6/12  -see note 6/13 re discussion and decision to stop d10 and labs  -he is not a trach candidate or HD candidate  -no utility in checking labs  -see note 6/13 for discussion and decision re titrating PAD in response to his high peak pressures.  P -DNR -no escalation of pressors -no labs -cont MV support -ongoing high peaks/plats 6/15-- have d/w RN re fent titration    CRITICAL CARE Performed by: Delories Fetter   Total critical care time: 35 minutes  Critical care time was exclusive of separately billable procedures and treating other patients. Critical care was necessary to treat or prevent imminent or life-threatening deterioration.  Critical care was time spent personally by me on the following activities: development of treatment plan with patient and/or surrogate as well as nursing, discussions with consultants, evaluation of patient's response to treatment, examination of patient, obtaining history from patient or surrogate, ordering and performing treatments and interventions, ordering and review of laboratory studies, ordering and review of radiographic studies, pulse oximetry and re-evaluation of patient's condition.  Eston Hence MSN, AGACNP-BC [place] Pulmonary/Critical Care Medicine Amion for pager 12/10/2023, 9:55 AM

## 2023-12-10 NOTE — Plan of Care (Signed)
  Problem: Clinical Measurements: Goal: Ability to maintain clinical measurements within normal limits will improve Outcome: Not Progressing Goal: Will remain free from infection Outcome: Not Progressing Goal: Diagnostic test results will improve Outcome: Not Progressing Goal: Respiratory complications will improve Outcome: Not Progressing Goal: Cardiovascular complication will be avoided Outcome: Not Progressing   Problem: Activity: Goal: Risk for activity intolerance will decrease Outcome: Not Progressing

## 2023-12-26 NOTE — Discharge Summary (Signed)
 DEATH SUMMARY   Patient Details  Name: [name] [name] MRN: [id number] DOB: 12/01/1933  Admission/Discharge Information   Admit Date:  12-20-23  Date of Death: Date of Death: 01-10-24 (Simultaneous filing. User may not have seen previous data.)  Time of Death: Time of Death: 0505 (Simultaneous filing. User may not have seen previous data.)  Length of Stay: 21  Referring Physician: Vicci Barnie NOVAK, MD   Reason(s) for Hospitalization  Aspiration pneumonia  Diagnoses  Preliminary cause of death: aspiration pneumonia. Secondary Diagnoses (including complications and co-morbidities):  Principal Problem:   Severe sepsis (HCC) Active Problems:   Hypothyroidism   Anemia, chronic disease   Essential hypertension   Hyperkalemia   Dysphagia   Aspiration pneumonia (HCC)   Elevated troponin   AKI (acute kidney injury) (HCC)   Protein-calorie malnutrition, severe   PEG (percutaneous endoscopic gastrostomy) status (HCC)   Failure to thrive in adult   Dehydration   Hypernatremia   Recurrent pneumonia   Brief Hospital Course (including significant findings, care, treatment, and services provided and events leading to death)  [name] [name] is a 89 y.o. year old male who presented with recurrent aspiration.   He had been bedbound at home for the past 3 years and cared for by his family due to severe dementia with dysphagia requiring a PEG tube placement.  He was fully dependent on his family for self-care.  On admission he was noted to be tachycardic and tachypneic with an elevated white count and procalcitonin.  He was started on IV fluids and antibiotics.  His respiratory status initially settled down and he was admitted to the hospitalist service.  Over the subsequent days, he had multiple episodes of tachypnea with desaturation attributable to recurrent aspiration events.  Given his overall frail condition and poor long-term prognosis, we were reluctant to offer mechanical  ventilation.  Palliative care had been consulted, but the family refused to place any limitations on his scope of care due to religious reasons.  They were however well aware that his health was declining but felt that all efforts should be made to keep him alive until he passed naturally.  We did explain to them that the current interventions for preventing such a natural death.  He eventually sustained a severe aspiration event requiring intubation.  He developed septic shock.  This initially improved with IV fluids and antibiotics.  The family had requested that he be evaluated for a tracheostomy to help prevent further aspiration, and ENT had evaluated the patient for surgical tracheostomy.  However the patient's renal function continued to decline and given his poor overall prognosis nephrology deemed not to be a candidate for dialysis.  The family acquiesced to this as the patient's condition continued to worsen.  They eventually accepted not to perform CPR.  The patient eventually succumbed to worsening multisystem organ failure.    Pertinent Labs and Studies  Significant Diagnostic Studies DG CHEST PORT 1 VIEW Result Date: 12/02/2023 CLINICAL DATA:  Hypoxemia. EXAM: PORTABLE CHEST 1 VIEW COMPARISON:  Chest radiograph dated 11/29/2023. FINDINGS: Endotracheal tube above the carina. Similar appearance of bilateral pulmonary opacities. Small left pleural effusion suspected. No pneumothorax. Stable cardiac silhouette. No acute osseous pathology. IMPRESSION: 1. Endotracheal tube above the carina. 2. Similar appearance of bilateral pulmonary opacities. Electronically Signed   By: Vanetta Chou M.D.   On: 12/02/2023 09:27   DG CHEST PORT 1 VIEW Result Date: 11/29/2023 CLINICAL DATA:  Intubation. EXAM: PORTABLE CHEST 1 VIEW COMPARISON:  November 29, 2023.  FINDINGS: Stable cardiomediastinal silhouette. Endotracheal tube is in grossly good position. Patchy bilateral airspace opacities are noted concerning for  multifocal pneumonia. Proximal left humeral fracture is again noted. IMPRESSION: Endotracheal tube in grossly good position. Bilateral lung opacities concerning for multifocal pneumonia. Electronically Signed   By: Lynwood Landy Raddle M.D.   On: 11/29/2023 13:44   DG CHEST PORT 1 VIEW Result Date: 11/29/2023 EXAM: 1 VIEW XRAY OF THE CHEST 11/29/2023 07:20:00 AM COMPARISON: 1 view chest x-ray 11/27/2023. CLINICAL HISTORY: Aspiration into airway. FINDINGS: LUNGS AND PLEURA: Increasing interstitial pattern likely reflects edema. Progressive bibasilar airspace disease is noted. HEART AND MEDIASTINUM: The heart is enlarged. BONES AND SOFT TISSUES: No acute osseous abnormality. LINES AND TUBES: Left-sided PICC line is stable. IMPRESSION: 1. Progressive bibasilar airspace disease, this may represent aspiration pneumonitis or pneumonia. 2. Increasing interstitial pattern, likely reflecting edema. 3. Enlarged heart. Electronically signed by: Lonni Necessary MD 11/29/2023 07:35 AM EDT RP Workstation: [id number]   DG CHEST PORT 1 VIEW Result Date: 11/27/2023 CLINICAL DATA:  Dyspnea EXAM: PORTABLE CHEST 1 VIEW COMPARISON:  Chest x-ray 11/26/2023 CT of the chest 10/06/1998 FINDINGS: There are mild patchy opacities in the right perihilar region and left mid and lower lung unchanged from the prior study. Cardiomediastinal silhouette is within normal limits. Left upper extremity PICC terminates in the SVC the there is no pneumothorax or pleural effusion. No acute fractures are seen. Healed proximal right humeral fracture present. IMPRESSION: Stable mild patchy airspace disease bilaterally. Electronically Signed   By: Greig Pique M.D.   On: 11/27/2023 15:34   DG CHEST PORT 1 VIEW Result Date: 11/26/2023 CLINICAL DATA:  Dyspnea EXAM: PORTABLE CHEST 1 VIEW COMPARISON:  11/23/2023 FINDINGS: Cardiac shadow is stable. Persistent airspace opacity is noted throughout the left lung and right base stable from the prior exam. No  sizable effusion is seen. Left PICC is noted. IMPRESSION: Stable appearance of the chest from the previous exam. Electronically Signed   By: Oneil Devonshire M.D.   On: 11/26/2023 09:49   DG ABDOMEN PEG TUBE LOCATION Result Date: 11/24/2023 CLINICAL DATA:  Peg tube EXAM: ABDOMEN - 1 VIEW COMPARISON:  10/01/2023 FINDINGS: Airspace disease at the left base. Contrast is seen within the stomach. There is no gross extravasation. IMPRESSION: Contrast within the stomach without gross extravasation. Electronically Signed   By: Luke Bun M.D.   On: 11/24/2023 22:57   DG CHEST PORT 1 VIEW Result Date: 11/23/2023 CLINICAL DATA:  Shortness of breath, sepsis EXAM: PORTABLE CHEST 1 VIEW COMPARISON:  11/20/2023 FINDINGS: Left PICC line tip in the SVC. Heart mediastinal contours within normal limits. Diffuse airspace disease throughout the left lung and in the right lower lobe. No effusions or pneumothorax. Chronic displaced left humeral neck fracture. No acute bony abnormality. IMPRESSION: Diffuse airspace disease throughout the left lung and in the right lower lobe concerning for pneumonia. This is similar or slightly worsened since prior study. Electronically Signed   By: Franky Crease M.D.   On: 11/23/2023 13:49   US  EKG SITE RITE Result Date: 11/22/2023 If Site Rite image not attached, placement could not be confirmed due to current cardiac rhythm.   Microbiology No results found for this or any previous visit (from the past 240 hours).  Lab Basic Metabolic Panel: No results for input(s): NA, K, CL, CO2, GLUCOSE, BUN, CREATININE, CALCIUM , MG, PHOS in the last 168 hours. Liver Function Tests: No results for input(s): AST, ALT, ALKPHOS, BILITOT, PROT, ALBUMIN  in the last 168 hours.  No results for input(s): LIPASE, AMYLASE in the last 168 hours. No results for input(s): AMMONIA in the last 168 hours. CBC: No results for input(s): WBC, NEUTROABS, HGB, HCT, MCV,  PLT in the last 168 hours. Cardiac Enzymes: No results for input(s): CKTOTAL, CKMB, CKMBINDEX, TROPONINI in the last 168 hours. Sepsis Labs: No results for input(s): PROCALCITON, WBC, LATICACIDVEN in the last 168 hours.  Procedures/Operations  Intubation   Tarah Buboltz 12/21/2023, 1:21 PM

## 2023-12-26 NOTE — Progress Notes (Signed)
 eLink Physician-Brief Progress Note Patient Name: [name] [name] DOB: 01/08/1934 MRN: [id number]   Date of Service  12/15/2023  HPI/Events of Note  Patient had a sudden change in rhythm, became bradycardic, then hypotensive.  Family is at bedside.  Eventually became pulseless  eICU Interventions  Given DNR status, the patient was pronounced at 0505 with 2 RNs at bedside.     Intervention Category Minor Interventions: Clinical assessment - ordering diagnostic tests  Carrington Olazabal 12/15/2023, 5:07 AM

## 2023-12-26 NOTE — Progress Notes (Signed)
 Patent had a 10 minute change in HR starting with bradycardia followed by v-fib followed by asystole. Family present . We recorded Time of death after no evidence of pulse and breathing (Me and Charge Nurse Brian)/Pt TOD recorded as 0505.

## 2023-12-26 DEATH — deceased

## 2024-03-05 MED FILL — Medication: Qty: 1 | Status: AC
